# Patient Record
Sex: Male | Born: 1955 | Race: White | Hispanic: No | Marital: Married | Smoking: Never smoker
Health system: Southern US, Community
[De-identification: ages and names within clinical notes are randomized; demographics above are authoritative.]

## PROBLEM LIST (undated history)

## (undated) DIAGNOSIS — M1990 Unspecified osteoarthritis, unspecified site: Secondary | ICD-10-CM

## (undated) DIAGNOSIS — Z8489 Family history of other specified conditions: Secondary | ICD-10-CM

## (undated) DIAGNOSIS — Z8619 Personal history of other infectious and parasitic diseases: Secondary | ICD-10-CM

## (undated) DIAGNOSIS — K56609 Unspecified intestinal obstruction, unspecified as to partial versus complete obstruction: Secondary | ICD-10-CM

## (undated) DIAGNOSIS — Z86010 Personal history of colonic polyps: Secondary | ICD-10-CM

## (undated) DIAGNOSIS — N390 Urinary tract infection, site not specified: Secondary | ICD-10-CM

## (undated) DIAGNOSIS — R7989 Other specified abnormal findings of blood chemistry: Principal | ICD-10-CM

## (undated) DIAGNOSIS — I509 Heart failure, unspecified: Secondary | ICD-10-CM

## (undated) DIAGNOSIS — N400 Enlarged prostate without lower urinary tract symptoms: Secondary | ICD-10-CM

## (undated) DIAGNOSIS — E538 Deficiency of other specified B group vitamins: Secondary | ICD-10-CM

## (undated) DIAGNOSIS — M722 Plantar fascial fibromatosis: Secondary | ICD-10-CM

## (undated) HISTORY — DX: Other specified abnormal findings of blood chemistry: R79.89

## (undated) HISTORY — PX: APPENDECTOMY: SHX54

## (undated) HISTORY — PX: SMALL INTESTINE SURGERY: SHX150

## (undated) HISTORY — DX: Plantar fascial fibromatosis: M72.2

## (undated) HISTORY — PX: OTHER SURGICAL HISTORY: SHX169

## (undated) HISTORY — DX: Personal history of other infectious and parasitic diseases: Z86.19

## (undated) HISTORY — PX: CHOLECYSTECTOMY: SHX55

## (undated) HISTORY — DX: Personal history of colonic polyps: Z86.010

## (undated) HISTORY — DX: Heart failure, unspecified: I50.9

## (undated) HISTORY — DX: Unspecified intestinal obstruction, unspecified as to partial versus complete obstruction: K56.609

---

## 2005-03-16 ENCOUNTER — Emergency Department (HOSPITAL_COMMUNITY): Admission: EM | Admit: 2005-03-16 | Discharge: 2005-03-16 | Payer: Self-pay | Admitting: Emergency Medicine

## 2007-10-08 DIAGNOSIS — Z86010 Personal history of colonic polyps: Secondary | ICD-10-CM

## 2007-10-08 HISTORY — DX: Personal history of colonic polyps: Z86.010

## 2008-02-05 ENCOUNTER — Ambulatory Visit: Payer: Self-pay | Admitting: Internal Medicine

## 2008-02-19 ENCOUNTER — Encounter: Payer: Self-pay | Admitting: Internal Medicine

## 2008-02-19 ENCOUNTER — Ambulatory Visit: Payer: Self-pay | Admitting: Internal Medicine

## 2008-02-22 ENCOUNTER — Encounter: Payer: Self-pay | Admitting: Internal Medicine

## 2009-12-27 ENCOUNTER — Ambulatory Visit (HOSPITAL_COMMUNITY): Admission: RE | Admit: 2009-12-27 | Discharge: 2009-12-27 | Payer: Self-pay | Admitting: Orthopedic Surgery

## 2013-02-25 ENCOUNTER — Encounter: Payer: Self-pay | Admitting: Internal Medicine

## 2014-03-16 ENCOUNTER — Encounter: Payer: Self-pay | Admitting: Internal Medicine

## 2014-12-15 ENCOUNTER — Emergency Department (HOSPITAL_COMMUNITY)
Admission: EM | Admit: 2014-12-15 | Discharge: 2014-12-16 | Disposition: A | Discharge status: Home / Self Care | Payer: 59 | Attending: Emergency Medicine | Admitting: Emergency Medicine

## 2014-12-15 ENCOUNTER — Emergency Department (HOSPITAL_COMMUNITY): Payer: 59

## 2014-12-15 ENCOUNTER — Encounter (HOSPITAL_COMMUNITY): Payer: Self-pay

## 2014-12-15 DIAGNOSIS — Y998 Other external cause status: Secondary | ICD-10-CM | POA: Diagnosis not present

## 2014-12-15 DIAGNOSIS — W208XXA Other cause of strike by thrown, projected or falling object, initial encounter: Secondary | ICD-10-CM | POA: Diagnosis not present

## 2014-12-15 DIAGNOSIS — S92401A Displaced unspecified fracture of right great toe, initial encounter for closed fracture: Secondary | ICD-10-CM

## 2014-12-15 DIAGNOSIS — S90112A Contusion of left great toe without damage to nail, initial encounter: Secondary | ICD-10-CM | POA: Insufficient documentation

## 2014-12-15 DIAGNOSIS — S92422A Displaced fracture of distal phalanx of left great toe, initial encounter for closed fracture: Secondary | ICD-10-CM | POA: Diagnosis not present

## 2014-12-15 DIAGNOSIS — Z23 Encounter for immunization: Secondary | ICD-10-CM | POA: Diagnosis not present

## 2014-12-15 DIAGNOSIS — Y9289 Other specified places as the place of occurrence of the external cause: Secondary | ICD-10-CM | POA: Diagnosis not present

## 2014-12-15 DIAGNOSIS — S99922A Unspecified injury of left foot, initial encounter: Secondary | ICD-10-CM | POA: Diagnosis present

## 2014-12-15 DIAGNOSIS — Y9389 Activity, other specified: Secondary | ICD-10-CM | POA: Diagnosis not present

## 2014-12-15 DIAGNOSIS — S90212A Contusion of left great toe with damage to nail, initial encounter: Secondary | ICD-10-CM

## 2014-12-15 MED ORDER — HYDROCODONE-ACETAMINOPHEN 5-325 MG PO TABS
2.0000 | ORAL_TABLET | Freq: Once | ORAL | Status: AC
  Administered 2014-12-15: 2 via ORAL
  Filled 2014-12-15: qty 2

## 2014-12-15 NOTE — ED Notes (Signed)
PA at bedside.

## 2014-12-15 NOTE — ED Notes (Signed)
Pt reports dropping a nitrogen bottle (125 lbs) on left great toe about 6 am.  Swelling and discoloration to left great toe.  Sensation intact.

## 2014-12-15 NOTE — ED Provider Notes (Signed)
CSN: [id number]     Arrival date & time 12/15/14  2000 History  This chart was scribed for non-physician practitioner, Jinny Sanders, PA-C,  working with Tilden Fossa, MD by Freida Busman, ED Scribe. This patient was seen in room TR08C/TR08C and the patient's care was started at 8:33 PM.    Chief Complaint  Patient presents with  . Toe Injury    The history is provided by the patient. No language interpreter was used.     HPI Comments:  Bradley Dominguez is a 59 y.o. male who presents to the Emergency Department complaining of moderate constant left great toe s/p injury ~0600 this am. Pt states a nitrogen bottle ~100-125 pounds  fell on his toe. He states pain and bruising has progressively worsened since onset. No alleviating factors or associated symptoms noted. Patient denies numbness, weakness, loss of sensation or function.   History reviewed. No pertinent past medical history. Past Surgical History  Procedure Laterality Date  . Small intestine surgery    . Appendectomy    . Cholecystectomy     History reviewed. No pertinent family history. History  Substance Use Topics  . Smoking status: Never Smoker   . Smokeless tobacco: Not on file  . Alcohol Use: No    Review of Systems  Musculoskeletal: Positive for myalgias and arthralgias.  Skin: Positive for color change.      Allergies  Review of patient's allergies indicates not on file.  Home Medications   Prior to Admission medications   Medication Sig Start Date End Date Taking? Authorizing Provider  cephALEXin (KEFLEX) 500 MG capsule Take 1 capsule (500 mg total) by mouth 2 (two) times daily. 12/16/14   Ladona Mow, PA-C  HYDROcodone-acetaminophen (NORCO/VICODIN) 5-325 MG per tablet Take 1-2 tablets by mouth every 6 (six) hours as needed. 12/16/14   Ladona Mow, PA-C   BP 120/80 mmHg  Pulse 77  Temp(Src) 98.2 F (36.8 C) (Oral)  Resp 12  Ht  (1.854 m)  Wt 195 lb (88.451 kg)  BMI 25.73 kg/m2  SpO2 100% Physical  Exam  Constitutional: He is oriented to person, place, and time. He appears well-developed and well-nourished.  HENT:  Head: Normocephalic and atraumatic.  Cardiovascular: Normal rate.   Pulmonary/Chest: Effort normal.  Abdominal: He exhibits no distension.  Musculoskeletal:  Severe ecchymosis to left great toe, mostly at MTP joint and dorsal aspect of the toe with blood blister to dorsal aspect  Cap refill is less than 2 sec DP pulse 2+ Sensation intact distally   Neurological: He is alert and oriented to person, place, and time.  Skin: Skin is warm and dry.  Psychiatric: He has a normal mood and affect.  Nursing note and vitals reviewed.   ED Course  INCISION AND DRAINAGE Date/Time: 12/16/2014 3:06 AM Performed by: Ladona Mow Authorized by: Ladona Mow Consent: Verbal consent obtained. Consent given by: patient Time out: Immediately prior to procedure a "time out" was called to verify the correct patient, procedure, equipment, support staff and site/side marked as required. Type: subungual hematoma Body area: lower extremity Location details: left big toe Patient sedated: no Complexity: simple Drainage amount: scant Wound treatment: wound left open Patient tolerance: Patient tolerated the procedure well with no immediate complications Comments: Subungual hematoma nail trephination     DIAGNOSTIC STUDIES:  Oxygen Saturation is 100% on RA, normal by my interpretation.    COORDINATION OF CARE:  8:39 PM Discussed treatment plan with pt at bedside and pt agreed to plan.  Labs Review Labs Reviewed - No data to display  Imaging Review Dg Toe Great Left  12/15/2014   CLINICAL DATA:  A bottle fell on the patient's first toe at 6 a.m. this morning. Pain and bruising.  EXAM: LEFT GREAT TOE  COMPARISON:  None.  FINDINGS: Comminuted distracted fractures of the mid and distal aspect of the distal phalanx of the left first toe. Fractures do not appear to extend to the joint  surface. Associated soft tissue swelling.  IMPRESSION: Acute posttraumatic crush fractures of the distal phalanx of the left first toe.   Electronically Signed   By: Burman NievesWilliam  Stevens M.D.   On: 12/15/2014 21:48     EKG Interpretation None      MDM   Final diagnoses:  Fracture of great toe, right, closed, initial encounter  Subungual hematoma of great toe of left foot, initial encounter    Patient here with toe injury after dropping a metal cylinder with compressed gas on it this morning. Radiographs with impression of an acute posttraumatic crush fractures of the distal phalanx of the left first toe. Associated fracture blister noted on exam. Patient neurovascularly intact. I consulted with Dr. Devonne DoughtyNoris regarding this patient, Dr. Devonne DoughtyNoris recommends to test it made any obvious subungual hematoma, up-to-date tetanus, give patient does of Ancef in the ER, sent home with by mouth Keflex, dress injury with bulky nonadhesive dressing and placed patient in postop shoe, to be nonweightbearing on forefoot. Nail trephinated as noted in procedure note. Patient offered Ancef, however declined it with patient stating he does not want it due to the amount of time he has been here, tetanus updated, followed recommendations by Dr. Devonne DoughtyNoris, and discharged home to follow-up with Dr. Roderic OvensNorth as an outpatient early next week. Discussed return precautions with patient, and patient verbalizes understanding and agreement of this plan. I encouraged patient to call or return to the ER should he have any questions or concerns.  I personally performed the services described in this documentation, which was scribed in my presence. The recorded information has been reviewed and is accurate.  BP 120/80 mmHg  Pulse 77  Temp(Src) 98.2 F (36.8 C) (Oral)  Resp 12  Ht 6\' 1"  (1.854 m)  Wt 195 lb (88.451 kg)  BMI 25.73 kg/m2  SpO2 100%  Signed,  Ladona MowJoe Rhealynn Myhre, PA-C 2:45 AM  Patient seen and discussed with Dr. Tilden FossaElizabeth Rees,  M.D.   Ladona MowJoe Selisa Tensley, PA-C 12/16/14 0245  Ladona MowJoe Lizet Kelso, PA-C 12/16/14 [phone]  Tilden FossaElizabeth Rees, MD 12/20/14 [phone]

## 2014-12-15 NOTE — ED Notes (Signed)
Pt returned to room from radiology

## 2014-12-15 NOTE — ED Notes (Signed)
Pt states he dropped a nitrogen bottle on his left great toe this am, states he is able to walk. Toe swollen and purple bruising present.

## 2014-12-15 NOTE — ED Notes (Signed)
Patient transported to X-ray 

## 2014-12-16 MED ORDER — STERILE WATER FOR INJECTION IJ SOLN: 2.5000 mL | Freq: Once | INTRAMUSCULAR | Status: DC

## 2014-12-16 MED ORDER — CEPHALEXIN 500 MG PO CAPS: 500.0000 mg | ORAL_CAPSULE | Freq: Two times a day (BID) | ORAL | Status: DC

## 2014-12-16 MED ORDER — CEFAZOLIN SODIUM 1 G IJ SOLR
500.0000 mg | Freq: Once | INTRAMUSCULAR | Status: DC
  Filled 2014-12-16: qty 10

## 2014-12-16 MED ORDER — HYDROCODONE-ACETAMINOPHEN 5-325 MG PO TABS: 1.0000 | ORAL_TABLET | Freq: Four times a day (QID) | ORAL | Status: DC | PRN

## 2014-12-16 MED ORDER — TETANUS-DIPHTH-ACELL PERTUSSIS 5-2.5-18.5 LF-MCG/0.5 IM SUSP
0.5000 mL | Freq: Once | INTRAMUSCULAR | Status: AC
  Administered 2014-12-16: 0.5 mL via INTRAMUSCULAR
  Filled 2014-12-16: qty 0.5

## 2014-12-16 MED ORDER — SODIUM CHLORIDE 0.9 % IV SOLN
500.0000 mg | Freq: Once | INTRAVENOUS | Status: DC
  Filled 2014-12-16: qty 4.95

## 2014-12-16 NOTE — Discharge Instructions (Signed)
Do not walk on the front of your toe and foot. Walk on the heel of your foot. Follow-up with orthopedics. Use medications as prescribed. Return to the ER with any numbness, weakness, tingling, loss of sensation or function, severe swelling, high fever greater than 100.17F.  Toe Fracture Your caregiver has diagnosed you as having a fractured toe. A toe fracture is a break in the bone of a toe. "Buddy taping" is a way of splinting your broken toe, by taping the broken toe to the toe next to it. This "buddy taping" will keep the injured toe from moving beyond normal range of motion. Buddy taping also helps the toe heal in a more normal alignment. It may take 6 to 8 weeks for the toe injury to heal. HOME CARE INSTRUCTIONS   Leave your toes taped together for as long as directed by your caregiver or until you see a doctor for a follow-up examination. You can change the tape after bathing. Always use a small piece of gauze or cotton between the toes when taping them together. This will help the skin stay dry and prevent infection.  Apply ice to the injury for 15-20 minutes each hour while awake for the first 2 days. Put the ice in a plastic bag and place a towel between the bag of ice and your skin.  After the first 2 days, apply heat to the injured area. Use heat for the next 2 to 3 days. Place a heating pad on the foot or soak the foot in warm water as directed by your caregiver.  Keep your foot elevated as much as possible to lessen swelling.  Wear sturdy, supportive shoes. The shoes should not pinch the toes or fit tightly against the toes.  Your caregiver may prescribe a rigid shoe if your foot is very swollen.  Your may be given crutches if the pain is too great and it hurts too much to walk.  Only take over-the-counter or prescription medicines for pain, discomfort, or fever as directed by your caregiver.  If your caregiver has given you a follow-up appointment, it is very important to keep  that appointment. Not keeping the appointment could result in a chronic or permanent injury, pain, and disability. If there is any problem keeping the appointment, you must call back to this facility for assistance. SEEK MEDICAL CARE IF:   You have increased pain or swelling, not relieved with medications.  The pain does not get better after 1 week.  Your injured toe is cold when the others are warm. SEEK IMMEDIATE MEDICAL CARE IF:   The toe becomes cold, numb, or white.  The toe becomes hot (inflamed) and red. Document Released: 09/20/2000 Document Revised: 12/16/2011 Document Reviewed: 05/09/2008 [facility] Patient Information 2015 [employer] [place] [place]. This information is not intended to replace advice given to you by your health care provider. Make sure you discuss any questions you have with your health care provider.

## 2015-01-02 ENCOUNTER — Encounter: Payer: Self-pay | Admitting: Nurse Practitioner

## 2015-01-17 ENCOUNTER — Ambulatory Visit (INDEPENDENT_AMBULATORY_CARE_PROVIDER_SITE_OTHER): Payer: 59 | Admitting: Nurse Practitioner

## 2015-01-17 ENCOUNTER — Encounter: Payer: Self-pay | Admitting: Nurse Practitioner

## 2015-01-17 VITALS — BP 106/86 | HR 78 | Ht 73.0 in | Wt 193.0 lb

## 2015-01-17 DIAGNOSIS — Z86010 Personal history of colonic polyps: Secondary | ICD-10-CM | POA: Diagnosis not present

## 2015-01-17 DIAGNOSIS — K625 Hemorrhage of anus and rectum: Secondary | ICD-10-CM | POA: Diagnosis not present

## 2015-01-17 MED ORDER — PEG-KCL-NACL-NASULF-NA ASC-C 100 G PO SOLR: 1.0000 | Freq: Once | ORAL | Status: DC

## 2015-01-17 NOTE — Patient Instructions (Signed)

## 2015-01-18 ENCOUNTER — Encounter: Payer: Self-pay | Admitting: Internal Medicine

## 2015-01-18 ENCOUNTER — Encounter: Payer: Self-pay | Admitting: Nurse Practitioner

## 2015-01-18 DIAGNOSIS — Z860100 Personal history of colon polyps, unspecified: Secondary | ICD-10-CM | POA: Insufficient documentation

## 2015-01-18 DIAGNOSIS — K625 Hemorrhage of anus and rectum: Secondary | ICD-10-CM | POA: Insufficient documentation

## 2015-01-18 NOTE — Progress Notes (Signed)
    HPI :  Patient is a 59 year old male known remotely to Dr. Henrene Pastor. He has a history of adenomatous colon polyps in 2009. Patient is referred by PCP for evaluation of rectal bleeding. Approximately 3 weeks ago patient broke his toe, sounds like he developed a blister or abscess which was drained and treated with antibiotics. While on antibiotics patient began having rectal bleeding. He had no constipation or diarrhea. Patient stopped the antibiotics (Keflex)) and bleeding stopped. He had no associated rectal pain or abdominal pain. Patient was evaluated by his PCP, labs on the revealed hemoglobin of 12.9, MCV 97. White count normal. LFTs, renal function normal.   Past Medical History  Diagnosis Date  . Plantar fasciitis   . History of shingles   . History of colon polyps 2009    Past Surgical History  Procedure Laterality Date  . Small intestine surgery      1979  . Appendectomy    . Cholecystectomy       Family History  Problem Relation Age of Onset  . Diabetes     History  Substance Use Topics  . Smoking status: Never Smoker   . Smokeless tobacco: Never Used  . Alcohol Use: No   Current Outpatient Prescriptions  Medication Sig Dispense Refill  . peg 3350 powder (MOVIPREP) 100 G SOLR Take 1 kit (200 g total) by mouth once. 1 kit 0   No current facility-administered medications for this visit.   No Known Allergies   Review of Systems: All systems reviewed and negative except where noted in HPI.   Physical Exam: BP 106/86 mmHg  Pulse 78  Ht $R'6\' 1"'qv$  (1.854 m)  Wt 193 lb (87.544 kg)  BMI 25.47 kg/m2 Constitutional: Pleasant,well-developed, white male in no acute distress. HEENT: Normocephalic and atraumatic. Conjunctivae are normal. No scleral icterus. Neck supple.  Cardiovascular: Normal rate, regular rhythm.  Pulmonary/chest: Effort normal and breath sounds normal. No wheezing, rales or rhonchi. Abdominal: Soft, nondistended, nontender. Bowel sounds active  throughout. There are no masses palpable. No hepatomegaly. Rectal: prefers to wait until colonoscopy Extremities: no edema Lymphadenopathy: No cervical adenopathy noted. Neurological: Alert and oriented to person place and time. Skin: Skin is warm and dry. No rashes noted. Psychiatric: Normal mood and affect. Behavior is normal.   ASSESSMENT AND PLAN:  53. 59 year old male referred for painless rectal bleeding. Bleeding occurred while taking antibiotics Fahrenheit don't think there is any relationship. He had no constipation or diarrhea associated with the bleeding. Hemoglobin okay at 12.9. For further evaluation of bleeding patient will be scheduled for colonoscopy. The risks, benefits, and alternatives to colonoscopy with possible biopsy and possible polypectomy were discussed with the patient and he consents to proceed.   2. History of adenomatous colon polyps in 2009. He is to for surveillance colonoscopy   3. History of intestinal surgery as teenager. 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 Following that patient had small bowel obstructions requiring additional resection. No problems and the last 30 years or more.  He does have a tortuous and fixed colon based on last colonoscopy, this may be from adhesions.   CC: Domenick Gong, MD

## 2015-01-24 NOTE — Progress Notes (Signed)
Agree with initial assessment and plans as outlined 

## 2015-01-27 ENCOUNTER — Encounter: Payer: Self-pay | Admitting: Internal Medicine

## 2015-01-27 ENCOUNTER — Ambulatory Visit (AMBULATORY_SURGERY_CENTER): Payer: 59 | Admitting: Internal Medicine

## 2015-01-27 VITALS — BP 112/66 | HR 58 | Temp 96.9°F | Resp 28 | Ht 73.0 in | Wt 193.0 lb

## 2015-01-27 DIAGNOSIS — Z86010 Personal history of colonic polyps: Secondary | ICD-10-CM | POA: Diagnosis present

## 2015-01-27 DIAGNOSIS — K625 Hemorrhage of anus and rectum: Secondary | ICD-10-CM

## 2015-01-27 MED ORDER — SODIUM CHLORIDE 0.9 % IV SOLN: 500.0000 mL | INTRAVENOUS | Status: DC

## 2015-01-27 NOTE — Op Note (Signed)
[facility] [address] [place], [id number]   COLONOSCOPY PROCEDURE REPORT  PATIENT: Bradley BrunsFields, [name]  MR#: [id number] BIRTHDATE: 09-15-1956 , 58  yrs. old GENDER: male ENDOSCOPIST: Roxy CedarJohn N Perry Jr, MD REFERRED BM:[id number]:Richard Tisovec, M.D. PROCEDURE DATE:  01/27/2015 PROCEDURE:   Colonoscopy, surveillance First Screening Colonoscopy - Avg.  risk and is 50 yrs.  old or older - No.  Prior Negative Screening - Now for repeat screening. N/A  History of Adenoma - Now for follow-up colonoscopy & has been > or = to 3 yrs.  Yes hx of adenoma.  Has been 3 or more years since last colonoscopy. ASA CLASS:   Class II INDICATIONS:Surveillance due to prior colonic neoplasia and PH Colon Adenoma.   Index exam 02-2008 w/ small TA (DIFFICULT EXAM) MEDICATIONS: Monitored anesthesia care and Propofol 350 mg IV  DESCRIPTION OF PROCEDURE:   After the risks benefits and alternatives of the procedure were thoroughly explained, informed consent was obtained.  The digital rectal exam revealed no abnormalities of the rectum.   The LB [id number]  endoscope was introduced through the anus and advanced to the cecum, which was identified by both the appendix and ileocecal valve. No adverse events experienced.   The quality of the prep was excellent. (MoviPrep was used)  The instrument was then slowly withdrawn as the colon was fully examined.     COLON FINDINGS: VERY DIFFICULT EXAM DUE TO PRIOR EXTENSIVE ABDOMINAL SURGERY.A normal appearing cecum, ileocecal valve, and appendiceal orifice were identified.  The ascending, transverse, descending, sigmoid colon, and rectum appeared unremarkable.  Retroflexed views revealed internal hemorrhoids and prominent rectal veins. The time to cecum = 16.8 Withdrawal time = 9.8   The scope was withdrawn and the procedure completed. COMPLICATIONS: There were no immediate complications.  ENDOSCOPIC IMPRESSION: 1. Normal colonoscopy 2. Difficult  exam due to altered anatomy  RECOMMENDATIONS: 1. Continue current colorectal screening recommendations with a repeat examination in 10 years. Recommend VIRTUAL COLONOSCOPY GIVEN ANATOMY 2. Previous bleeding due to hemorrhoids  eSigned:  Roxy CedarJohn N Perry Jr, MD 01/27/2015 3:58 PM   cc: Guerry Bruinichard Tisovec, MD and The Patient

## 2015-01-27 NOTE — Progress Notes (Signed)
A/ox3 pleased with MAC, report to Penny RN 

## 2015-01-27 NOTE — Patient Instructions (Addendum)
YOU HAD AN ENDOSCOPIC PROCEDURE TODAY AT THE [facility]:   Refer to the procedure report that was given to you for any specific questions about what was found during the examination.  If the procedure report does not answer your questions, please call your gastroenterologist to clarify.  If you requested that your care partner not be given the details of your procedure findings, then the procedure report has been included in a sealed envelope for you to review at your convenience later.  YOU SHOULD EXPECT: Some feelings of bloating in the abdomen. Passage of more gas than usual.  Walking can help get rid of the air that was put into your GI tract during the procedure and reduce the bloating. If you had a lower endoscopy (such as a colonoscopy or flexible sigmoidoscopy) you may notice spotting of blood in your stool or on the toilet paper. If you underwent a bowel prep for your procedure, you may not have a normal bowel movement for a few days.  Please Note:  You might notice some irritation and congestion in your nose or some drainage.  This is from the oxygen used during your procedure.  There is no need for concern and it should clear up in a day or so.  SYMPTOMS TO REPORT IMMEDIATELY:   Following lower endoscopy (colonoscopy or flexible sigmoidoscopy):  Excessive amounts of blood in the stool  Significant tenderness or worsening of abdominal pains  Swelling of the abdomen that is new, acute  Fever of 100F or higher    For urgent or emergent issues, a gastroenterologist can be reached at any hour by calling [phone].   DIET: Your first meal following the procedure should be a small meal and then it is ok to progress to your normal diet. Heavy or fried foods are harder to digest and may make you feel nauseous or bloated.  Likewise, meals heavy in dairy and vegetables can increase bloating.  Drink plenty of fluids but you should avoid alcoholic beverages for 24  hours.  ACTIVITY:  You should plan to take it easy for the rest of today and you should NOT DRIVE or use heavy machinery until tomorrow (because of the sedation medicines used during the test).    FOLLOW UP: Our staff will call the number listed on your records the next business day following your procedure to check on you and address any questions or concerns that you may have regarding the information given to you following your procedure. If we do not reach you, we will leave a message.  However, if you are feeling well and you are not experiencing any problems, there is no need to return our call.  We will assume that you have returned to your regular daily activities without incident.  If any biopsies were taken you will be contacted by phone or by letter within the next 1-3 weeks.  Please call us at [phone] if you have not heard about the biopsies in 3 weeks.    SIGNATURES/CONFIDENTIALITY: You and/or your care partner have signed paperwork which will be entered into your electronic medical record.  These signatures attest to the fact that that the information above on your After Visit Summary has been reviewed and is understood.  Full responsibility of the confidentiality of this discharge information lies with you and/or your care-partner.   Information on hemorrhoids given to you today 

## 2015-01-30 ENCOUNTER — Telehealth: Payer: Self-pay

## 2015-01-30 NOTE — Telephone Encounter (Signed)
  Follow up Call-  Call back number 01/27/2015  Post procedure Call Back phone  # [phone]  Permission to leave phone message No  comments no answering machine     Patient questions:  Do you have a fever, pain , or abdominal swelling? No. Pain Score  0 *  Have you tolerated food without any problems? Yes.    Have you been able to return to your normal activities? Yes.    Do you have any questions about your discharge instructions: Diet   No. Medications  No. Follow up visit  No.  Do you have questions or concerns about your Care? No.  Actions: * If pain score is 4 or above: No action needed, pain <4.

## 2015-04-17 ENCOUNTER — Encounter (HOSPITAL_BASED_OUTPATIENT_CLINIC_OR_DEPARTMENT_OTHER): Payer: Self-pay | Admitting: *Deleted

## 2015-04-17 ENCOUNTER — Emergency Department (HOSPITAL_BASED_OUTPATIENT_CLINIC_OR_DEPARTMENT_OTHER)
Admission: EM | Admit: 2015-04-17 | Discharge: 2015-04-17 | Disposition: A | Discharge status: Home / Self Care | Payer: 59 | Attending: Emergency Medicine | Admitting: Emergency Medicine

## 2015-04-17 DIAGNOSIS — R51 Headache: Secondary | ICD-10-CM | POA: Insufficient documentation

## 2015-04-17 DIAGNOSIS — Z8619 Personal history of other infectious and parasitic diseases: Secondary | ICD-10-CM | POA: Diagnosis not present

## 2015-04-17 DIAGNOSIS — Z8739 Personal history of other diseases of the musculoskeletal system and connective tissue: Secondary | ICD-10-CM | POA: Insufficient documentation

## 2015-04-17 DIAGNOSIS — R509 Fever, unspecified: Secondary | ICD-10-CM | POA: Diagnosis present

## 2015-04-17 DIAGNOSIS — N390 Urinary tract infection, site not specified: Secondary | ICD-10-CM | POA: Insufficient documentation

## 2015-04-17 DIAGNOSIS — Z86010 Personal history of colonic polyps: Secondary | ICD-10-CM | POA: Diagnosis not present

## 2015-04-17 LAB — URINE MICROSCOPIC-ADD ON

## 2015-04-17 LAB — URINALYSIS, ROUTINE W REFLEX MICROSCOPIC
Bilirubin Urine: NEGATIVE
Glucose, UA: NEGATIVE mg/dL
KETONES UR: NEGATIVE mg/dL
NITRITE: NEGATIVE
PH: 6.5 (ref 5.0–8.0)
PROTEIN: 30 mg/dL — AB
Specific Gravity, Urine: 1.016 (ref 1.005–1.030)
UROBILINOGEN UA: 0.2 mg/dL (ref 0.0–1.0)

## 2015-04-17 MED ORDER — CEPHALEXIN 500 MG PO CAPS: 500.0000 mg | ORAL_CAPSULE | Freq: Four times a day (QID) | ORAL | Status: DC

## 2015-04-17 MED ORDER — CEFTRIAXONE SODIUM 1 G IJ SOLR
1.0000 g | Freq: Once | INTRAMUSCULAR | Status: AC
  Administered 2015-04-17: 1 g via INTRAMUSCULAR
  Filled 2015-04-17: qty 10

## 2015-04-17 MED ORDER — LIDOCAINE HCL (PF) 1 % IJ SOLN
INTRAMUSCULAR | Status: AC
  Administered 2015-04-17: 2.1 mL
  Filled 2015-04-17: qty 5

## 2015-04-17 NOTE — ED Provider Notes (Signed)
CSN: [id number]     Arrival date & time 04/17/15  1845 History  This chart was scribed for Bradley Lyons, MD by Budd Palmer, ED Scribe. This patient was seen in room MH05/MH05 and the patient's care was started at 8:02 PM.    Chief Complaint  Patient presents with  . Fever  Patient is a 59 y.o. male presenting with fever. The history is provided by the patient. No language interpreter was used.  Fever Max temp prior to arrival:  102 Onset quality:  Sudden Duration:  2 days Timing:  Constant Progression:  Unchanged Chronicity:  New Associated symptoms: chills and headaches   Associated symptoms: no cough, no diarrhea, no ear pain, no nausea, no rash, no sore throat and no vomiting   Risk factors: no sick contacts    HPI Comments: Bradley Dominguez is a 59 y.o. male who presents to the Emergency Department complaining of a fever Tmax 102, onset 2 days ago. He reports associated chills, urgency without being able to urinate, and HA. He has been taking Tylenol with effective relief of fever. He has taken Excedrin which has relieved the headache. He is not on any other medications. He is otherwise healthy. He denies n/v/d, earache, sore throat, cough, rash. He denies sick contacts and PMHx of UTI.  Past Medical History  Diagnosis Date  . Plantar fasciitis   . History of shingles   . History of colon polyps 2009   Past Surgical History  Procedure Laterality Date  . Small intestine surgery      1979  . Appendectomy    . Cholecystectomy     Family History  Problem Relation Age of Onset  . Diabetes     History  Substance Use Topics  . Smoking status: Never Smoker   . Smokeless tobacco: Never Used  . Alcohol Use: No    Review of Systems  Constitutional: Positive for fever and chills.  HENT: Negative for ear pain and sore throat.   Respiratory: Negative for cough.   Gastrointestinal: Negative for nausea, vomiting and diarrhea.  Genitourinary: Positive for urgency.  Skin:  Negative for rash.  Neurological: Positive for headaches.  All other systems reviewed and are negative.     Allergies  Review of patient's allergies indicates no known allergies.  Home Medications   Prior to Admission medications   Medication Sig Start Date End Date Taking? Authorizing Provider  acetaminophen (TYLENOL) 500 MG tablet Take 1,000 mg by mouth every 6 (six) hours as needed.   Yes Historical Provider, MD   BP 123/66 mmHg  Pulse 85  Temp(Src) 99.1 F (37.3 C)  Resp 18  Wt 193 lb (87.544 kg)  SpO2 98% Physical Exam  Constitutional: He is oriented to person, place, and time. He appears well-developed and well-nourished. No distress.  HENT:  Head: Normocephalic and atraumatic.  Right Ear: External ear normal.  Left Ear: External ear normal.  Mouth/Throat: Oropharynx is clear and moist.  Ears and throat normal  Eyes: Conjunctivae and EOM are normal. Pupils are equal, round, and reactive to light.  Neck: Normal range of motion. Neck supple. No tracheal deviation present.  Cardiovascular: Normal rate.   Pulmonary/Chest: Breath sounds normal. No respiratory distress.  Abdominal: Soft.  Musculoskeletal: Normal range of motion.  Neurological: He is alert and oriented to person, place, and time.  Skin: Skin is warm and dry.  Psychiatric: He has a normal mood and affect. His behavior is normal.  Nursing note and vitals reviewed.  ED Course  Procedures  DIAGNOSTIC STUDIES: Oxygen Saturation is 98% on RA, normal by my interpretation.    COORDINATION OF CARE: 8:09 PM - Discussed urinalysis. Discussed plans to order antibiotics. Advised to continue taking tylenol/motrin and return to ED if symptoms worse. Pt advised of plan for treatment and pt agrees.  Labs Review Labs Reviewed  URINALYSIS, ROUTINE W REFLEX MICROSCOPIC (NOT [facility]) - Abnormal; Notable for the following:    APPearance CLOUDY (*)    Hgb urine dipstick LARGE (*)    Protein, ur 30 (*)    Leukocytes,  UA LARGE (*)    All other components within normal limits  URINE MICROSCOPIC-ADD ON - Abnormal; Notable for the following:    Squamous Epithelial / LPF FEW (*)    Bacteria, UA MANY (*)    All other components within normal limits    Imaging Review No results found.   EKG Interpretation None      MDM   Final diagnoses:  None    Patient presents with complaints of fever and urinary frequency. His urine analysis reveals a UTI. He appears nontoxic, is afebrile in the ER. There is no tachycardia or hypotension or suggestions of a septic condition. He will be treated with antibiotics and when necessary return.  I personally performed the services described in this documentation, which was scribed in my presence. The recorded information has been reviewed and is accurate.      Bradley Lyonsouglas Magaline Steinberg, MD 04/18/15 [phone]

## 2015-04-17 NOTE — ED Notes (Signed)
Pt c/o fever and body aches x 2 days, also c/o freq urination

## 2015-04-17 NOTE — Discharge Instructions (Signed)
Keflex as prescribed.  Tylenol 1000 mg rotated with ibuprofen 600 mg every 4 hours as needed for pain or fever.  Return to the emergency department if symptoms significantly worsen or change.   Urinary Tract Infection Urinary tract infections (UTIs) can develop anywhere along your urinary tract. Your urinary tract is your body's drainage system for removing wastes and extra water. Your urinary tract includes two kidneys, two ureters, a bladder, and a urethra. Your kidneys are a pair of bean-shaped organs. Each kidney is about the size of your fist. They are located below your ribs, one on each side of your spine. CAUSES Infections are caused by microbes, which are microscopic organisms, including fungi, viruses, and bacteria. These organisms are so small that they can only be seen through a microscope. Bacteria are the microbes that most commonly cause UTIs. SYMPTOMS  Symptoms of UTIs may vary by age and gender of the patient and by the location of the infection. Symptoms in young women typically include a frequent and intense urge to urinate and a painful, burning feeling in the bladder or urethra during urination. Older women and men are more likely to be tired, shaky, and weak and have muscle aches and abdominal pain. A fever may mean the infection is in your kidneys. Other symptoms of a kidney infection include pain in your back or sides below the ribs, nausea, and vomiting. DIAGNOSIS To diagnose a UTI, your caregiver will ask you about your symptoms. Your caregiver also will ask to provide a urine sample. The urine sample will be tested for bacteria and white blood cells. White blood cells are made by your body to help fight infection. TREATMENT  Typically, UTIs can be treated with medication. Because most UTIs are caused by a bacterial infection, they usually can be treated with the use of antibiotics. The choice of antibiotic and length of treatment depend on your symptoms and the type of  bacteria causing your infection. HOME CARE INSTRUCTIONS  If you were prescribed antibiotics, take them exactly as your caregiver instructs you. Finish the medication even if you feel better after you have only taken some of the medication.  Drink enough water and fluids to keep your urine clear or pale yellow.  Avoid caffeine, tea, and carbonated beverages. They tend to irritate your bladder.  Empty your bladder often. Avoid holding urine for long periods of time.  Empty your bladder before and after sexual intercourse.  After a bowel movement, women should cleanse from front to back. Use each tissue only once. SEEK MEDICAL CARE IF:   You have back pain.  You develop a fever.  Your symptoms do not begin to resolve within 3 days. SEEK IMMEDIATE MEDICAL CARE IF:   You have severe back pain or lower abdominal pain.  You develop chills.  You have nausea or vomiting.  You have continued burning or discomfort with urination. MAKE SURE YOU:   Understand these instructions.  Will watch your condition.  Will get help right away if you are not doing well or get worse. Document Released: 07/03/2005 Document Revised: 03/24/2012 Document Reviewed: 11/01/2011 [facility] Patient Information 2015 [employer] [place]. This information is not intended to replace advice given to you by your health care provider. Make sure you discuss any questions you have with your health care provider.

## 2015-04-17 NOTE — ED Notes (Signed)
Pt moved to discharge waiting room pending DC.

## 2015-11-30 ENCOUNTER — Encounter: Payer: Self-pay | Admitting: Internal Medicine

## 2016-10-15 DIAGNOSIS — H52203 Unspecified astigmatism, bilateral: Secondary | ICD-10-CM | POA: Diagnosis not present

## 2016-10-15 DIAGNOSIS — H524 Presbyopia: Secondary | ICD-10-CM | POA: Diagnosis not present

## 2016-10-15 DIAGNOSIS — H5203 Hypermetropia, bilateral: Secondary | ICD-10-CM | POA: Diagnosis not present

## 2016-10-25 DIAGNOSIS — N390 Urinary tract infection, site not specified: Secondary | ICD-10-CM | POA: Diagnosis not present

## 2016-10-25 DIAGNOSIS — B961 Klebsiella pneumoniae [K. pneumoniae] as the cause of diseases classified elsewhere: Secondary | ICD-10-CM | POA: Diagnosis not present

## 2016-10-25 DIAGNOSIS — R310 Gross hematuria: Secondary | ICD-10-CM | POA: Diagnosis not present

## 2016-10-30 DIAGNOSIS — R310 Gross hematuria: Secondary | ICD-10-CM | POA: Diagnosis not present

## 2016-11-07 DIAGNOSIS — R310 Gross hematuria: Secondary | ICD-10-CM | POA: Diagnosis not present

## 2017-01-22 DIAGNOSIS — N390 Urinary tract infection, site not specified: Secondary | ICD-10-CM | POA: Diagnosis not present

## 2017-01-22 DIAGNOSIS — Z0000 Encounter for general adult medical examination without abnormal findings: Secondary | ICD-10-CM | POA: Diagnosis not present

## 2017-01-29 DIAGNOSIS — Z1389 Encounter for screening for other disorder: Secondary | ICD-10-CM | POA: Diagnosis not present

## 2017-01-29 DIAGNOSIS — E538 Deficiency of other specified B group vitamins: Secondary | ICD-10-CM | POA: Diagnosis not present

## 2017-01-29 DIAGNOSIS — N390 Urinary tract infection, site not specified: Secondary | ICD-10-CM | POA: Diagnosis not present

## 2017-01-29 DIAGNOSIS — R339 Retention of urine, unspecified: Secondary | ICD-10-CM | POA: Diagnosis not present

## 2017-01-29 DIAGNOSIS — Z0000 Encounter for general adult medical examination without abnormal findings: Secondary | ICD-10-CM | POA: Diagnosis not present

## 2017-02-15 ENCOUNTER — Emergency Department (HOSPITAL_BASED_OUTPATIENT_CLINIC_OR_DEPARTMENT_OTHER): Payer: 59

## 2017-02-15 ENCOUNTER — Encounter (HOSPITAL_BASED_OUTPATIENT_CLINIC_OR_DEPARTMENT_OTHER): Payer: Self-pay | Admitting: Emergency Medicine

## 2017-02-15 ENCOUNTER — Emergency Department (HOSPITAL_BASED_OUTPATIENT_CLINIC_OR_DEPARTMENT_OTHER)
Admission: EM | Admit: 2017-02-15 | Discharge: 2017-02-15 | Disposition: A | Discharge status: Home / Self Care | Payer: 59 | Attending: Emergency Medicine | Admitting: Emergency Medicine

## 2017-02-15 DIAGNOSIS — Y939 Activity, unspecified: Secondary | ICD-10-CM | POA: Insufficient documentation

## 2017-02-15 DIAGNOSIS — Y999 Unspecified external cause status: Secondary | ICD-10-CM | POA: Diagnosis not present

## 2017-02-15 DIAGNOSIS — W19XXXA Unspecified fall, initial encounter: Secondary | ICD-10-CM | POA: Insufficient documentation

## 2017-02-15 DIAGNOSIS — S6991XA Unspecified injury of right wrist, hand and finger(s), initial encounter: Secondary | ICD-10-CM | POA: Diagnosis not present

## 2017-02-15 DIAGNOSIS — Y929 Unspecified place or not applicable: Secondary | ICD-10-CM | POA: Insufficient documentation

## 2017-02-15 DIAGNOSIS — Z79899 Other long term (current) drug therapy: Secondary | ICD-10-CM | POA: Insufficient documentation

## 2017-02-15 DIAGNOSIS — M25441 Effusion, right hand: Secondary | ICD-10-CM

## 2017-02-15 DIAGNOSIS — M7989 Other specified soft tissue disorders: Secondary | ICD-10-CM | POA: Insufficient documentation

## 2017-02-15 HISTORY — DX: Benign prostatic hyperplasia without lower urinary tract symptoms: N40.0

## 2017-02-15 MED ORDER — NAPROXEN 500 MG PO TABS: 500.0000 mg | ORAL_TABLET | Freq: Two times a day (BID) | ORAL | 0 refills | Status: DC

## 2017-02-15 NOTE — ED Provider Notes (Signed)
Pt seen and evaluated.  Discussed with PA. Felicita GageJ. Geiple. STS without errythema or cellulitis to RUE MCP. Arthritic on xray. Likely arthritis exacerbated by recent fall. Has tuft fx to rue 5th P3. Again, Likely from fall. Doubt that 3rd digit infectious/septic arthritis.   Plan NSAID and PCP f/u.  Return to ER with fever, red streaks, etc.   Rolland PorterJames, Aliviyah Malanga, MD 02/15/17 2213

## 2017-02-15 NOTE — ED Triage Notes (Signed)
Was strung by a wasp on wed and now has ? cellulitis to right ant hand/knuckle area with swelling and redness

## 2017-02-15 NOTE — ED Provider Notes (Signed)
[facility] Provider Note   CSN: [id number] Arrival date & time: 02/15/17  1853  By signing my name below, I, Lyndon CodeMaurice Copeland Jr., attest that this documentation has been prepared under the direction and in the presence of Renne CriglerJoshua Charlette Hennings, [place]-C. Electronically Signed: Orpah CobbMaurice Copeland , ED Scribe. 02/15/17. 8:27 PM.   History   Chief Complaint Chief Complaint  Patient presents with  . Hand Problem    HPI Bradley Dominguez is a 61 y.o. male with no significant medical hx who presents to the Emergency Department complaining of R hand problem with onset x3 days. Pt states that he was stung by a bee x3 days ago but when he woke up today he could not move the R hand or make a fist. Per wife, pt had a fall x2 weeks ago and she is unsure if the fall is related. Pt reports R hand pain/swelling. He denies any modifying factors. Pt denies numbness, tingling, fever. Of note, pt recently finished a course of antibiotics for a bladder infection.   The history is provided by the patient and the spouse. No language interpreter was used.    Past Medical History:  Diagnosis Date  . History of colon polyps 2009  . History of shingles   . Plantar fasciitis   . Prostate hypertrophy     Patient Active Problem List   Diagnosis Date Noted  . Hx of colonic polyps 01/18/2015  . Rectal bleeding 01/18/2015    Past Surgical History:  Procedure Laterality Date  . APPENDECTOMY    . CHOLECYSTECTOMY    . SMALL INTESTINE SURGERY     1979       Home Medications    Prior to Admission medications   Medication Sig Start Date End Date Taking? Authorizing Provider  finasteride (PROSCAR) 5 MG tablet Take 5 mg by mouth daily.   Yes [provider]  tamsulosin (FLOMAX) 0.4 MG CAPS capsule Take 0.4 mg by mouth.   Yes [provider]  acetaminophen (TYLENOL) 500 MG tablet Take 1,000 mg by mouth every 6 (six) hours as needed.    [provider]  cephALEXin (KEFLEX) 500 MG  capsule Take 1 capsule (500 mg total) by mouth 4 (four) times daily. 04/17/15   Geoffery Lyonselo, Douglas, MD    Family History Family History  Problem Relation Age of Onset  . Diabetes Unknown     Social History Social History  Substance Use Topics  . Smoking status: Never Smoker  . Smokeless tobacco: Never Used  . Alcohol use No     Allergies   Patient has no known allergies.   Review of Systems Review of Systems  Constitutional: Negative for activity change and fever.  Gastrointestinal: Constipation: R hand.  Musculoskeletal: Positive for myalgias. Negative for arthralgias, back pain, gait problem, joint swelling and neck pain.  Skin: Negative for wound.  Neurological: Negative for weakness and numbness.     Physical Exam Updated Vital Signs BP 98/70 (BP Location: Left Arm)   Pulse 69   Temp 98.4 F (36.9 C) (Oral)   Resp 17   Ht 6\' 1"  (1.854 m)   Wt 188 lb (85.3 kg)   SpO2 98%   BMI 24.80 kg/m   Physical Exam  Constitutional: He appears well-developed and well-nourished.  HENT:  Head: Normocephalic and atraumatic.  Eyes: Conjunctivae are normal.  Neck: Neck supple.  Cardiovascular: Normal rate and regular rhythm.   No murmur heard. Pulmonary/Chest: Effort normal and breath sounds normal. No respiratory  distress.  Abdominal: Soft. There is no tenderness.  Musculoskeletal: He exhibits no edema.  Patient with mild erythema and swelling to the right 3rd  MCP area. Full range of motion of joints.  Neurological: He is alert.  Skin: Skin is warm and dry.  Psychiatric: He has a normal mood and affect.  Nursing note and vitals reviewed.    ED Treatments / Results   DIAGNOSTIC STUDIES: Oxygen Saturation is 98% on RA, normal by my interpretation.   COORDINATION OF CARE: 8:30 PM-Discussed next steps with pt. Pt verbalized understanding and is agreeable with the plan.    Procedures Procedures (including critical care time)  Medications Ordered in  ED Medications - No data to display   Initial Impression / Assessment and Plan / ED Course  I have reviewed the triage vital signs and the nursing notes.  Pertinent labs & imaging results that were available during my care of the patient were reviewed by me and considered in my medical decision making (see chart for details).     Patient seen and examined. Discussed with and seen by Dr. Fayrene Fearing. Updated on x-ray results.  Vital signs reviewed and are as follows: BP 104/73 (BP Location: Right Arm)   Pulse (!) 57   Temp 98.4 F (36.9 C) (Oral)   Resp 16   Ht 6\' 1"  (1.854 m)   Wt 85.3 kg (188 lb)   SpO2 100%   BMI 24.80 kg/m    Final Clinical Impressions(s) / ED Diagnoses   Final diagnoses:  Finger joint swelling, right   Patient with swelling after recent bee sting over 3rd MCP. Area is warm but does not appear particularly cellulitic. No streaking or lymphangitis. Patient with good range of motion, doubt septic arthritis. No history of gout. Treat conservatively with ice, NSAIDs, elevation.  New Prescriptions Discharge Medication List as of 02/15/2017 10:11 PM    START taking these medications   Details  naproxen (NAPROSYN) 500 MG tablet Take 1 tablet (500 mg total) by mouth 2 (two) times daily., Starting Sat 02/15/2017, Print       I personally performed the services described in this documentation, which was scribed in my presence. The recorded information has been reviewed and is accurate.     Renne Crigler, PA-C 02/25/17 1339    Rolland Porter, MD 03/04/17 [phone]

## 2017-02-15 NOTE — Discharge Instructions (Signed)
Please read and follow all provided instructions.  Your diagnoses today include:  1. Finger joint swelling, right     Tests performed today include:  An x-ray of the affected area - shows joint swelling, does NOT show any broken bones  Vital signs. See below for your results today.   Medications prescribed:   Naproxen - anti-inflammatory pain medication  Do not exceed 500mg  naproxen every 12 hours, take with food  You have been prescribed an anti-inflammatory medication or NSAID. Take with food. Take smallest effective dose for the shortest duration needed for your pain. Stop taking if you experience stomach pain or vomiting.   Take any prescribed medications only as directed.  Home care instructions:   Follow any educational materials contained in this packet  Follow R.I.C.E. Protocol:  R - rest your injury   I  - use ice on injury without applying directly to skin  C - compress injury with bandage or splint  E - elevate the injury as much as possible  Follow-up instructions: Please follow-up with your primary care provider or the provided orthopedic physician (bone specialist) if you continue to have significant pain in 1 week. In this case you may have a more severe injury that requires further care.   Return instructions:   Please return if your fingers are numb or tingling, appear gray or blue, or you have severe pain (also elevate the arm and loosen splint or wrap if you were given one)  Please return to the Emergency Department if you experience worsening symptoms.   Please return if you have any other emergent concerns.  Additional Information:  Your vital signs today were: BP 100/68 (BP Location: Right Arm)    Pulse (!) 55    Temp 98.4 F (36.9 C) (Oral)    Resp 16    Ht 6\' 1"  (1.854 m)    Wt 85.3 kg    SpO2 99%    BMI 24.80 kg/m  If your blood pressure (BP) was elevated above 135/85 this visit, please have this repeated by your doctor within one  month. --------------

## 2017-03-10 DIAGNOSIS — N390 Urinary tract infection, site not specified: Secondary | ICD-10-CM | POA: Diagnosis not present

## 2017-03-10 DIAGNOSIS — R310 Gross hematuria: Secondary | ICD-10-CM | POA: Diagnosis not present

## 2017-03-10 DIAGNOSIS — B961 Klebsiella pneumoniae [K. pneumoniae] as the cause of diseases classified elsewhere: Secondary | ICD-10-CM | POA: Diagnosis not present

## 2017-05-16 DIAGNOSIS — R310 Gross hematuria: Secondary | ICD-10-CM | POA: Diagnosis not present

## 2017-05-16 DIAGNOSIS — B961 Klebsiella pneumoniae [K. pneumoniae] as the cause of diseases classified elsewhere: Secondary | ICD-10-CM | POA: Diagnosis not present

## 2017-05-16 DIAGNOSIS — N390 Urinary tract infection, site not specified: Secondary | ICD-10-CM | POA: Diagnosis not present

## 2017-08-06 DIAGNOSIS — R310 Gross hematuria: Secondary | ICD-10-CM | POA: Diagnosis not present

## 2017-08-14 DIAGNOSIS — N401 Enlarged prostate with lower urinary tract symptoms: Secondary | ICD-10-CM | POA: Diagnosis not present

## 2017-08-14 DIAGNOSIS — N390 Urinary tract infection, site not specified: Secondary | ICD-10-CM | POA: Diagnosis not present

## 2017-08-14 DIAGNOSIS — B961 Klebsiella pneumoniae [K. pneumoniae] as the cause of diseases classified elsewhere: Secondary | ICD-10-CM | POA: Diagnosis not present

## 2017-08-14 DIAGNOSIS — R310 Gross hematuria: Secondary | ICD-10-CM | POA: Diagnosis not present

## 2017-08-15 ENCOUNTER — Other Ambulatory Visit: Payer: Self-pay | Admitting: Urology

## 2017-08-21 DIAGNOSIS — R310 Gross hematuria: Secondary | ICD-10-CM | POA: Diagnosis not present

## 2017-09-17 DIAGNOSIS — R310 Gross hematuria: Secondary | ICD-10-CM | POA: Diagnosis not present

## 2017-09-17 DIAGNOSIS — R3914 Feeling of incomplete bladder emptying: Secondary | ICD-10-CM | POA: Diagnosis not present

## 2017-09-17 DIAGNOSIS — N401 Enlarged prostate with lower urinary tract symptoms: Secondary | ICD-10-CM | POA: Diagnosis not present

## 2017-09-18 ENCOUNTER — Encounter (HOSPITAL_BASED_OUTPATIENT_CLINIC_OR_DEPARTMENT_OTHER): Payer: Self-pay | Admitting: *Deleted

## 2017-09-18 ENCOUNTER — Other Ambulatory Visit: Payer: Self-pay

## 2017-09-18 NOTE — Progress Notes (Signed)
NPO AFTER MIDNIGHT ARRIVE 1045 AM 09-22-17 [facility] , NO MEDS TO TAKE WENT OVER OVERNIGHT [facility] INSTRUCTIONS WITH WIFE EMILY, NO MEDS TO TAKE, NEEDS HEMAGLOBIN.

## 2017-09-22 ENCOUNTER — Encounter (HOSPITAL_BASED_OUTPATIENT_CLINIC_OR_DEPARTMENT_OTHER): Payer: Self-pay | Admitting: Anesthesiology

## 2017-09-22 ENCOUNTER — Ambulatory Visit (HOSPITAL_BASED_OUTPATIENT_CLINIC_OR_DEPARTMENT_OTHER): Payer: 59 | Admitting: Anesthesiology

## 2017-09-22 ENCOUNTER — Encounter (HOSPITAL_BASED_OUTPATIENT_CLINIC_OR_DEPARTMENT_OTHER)
Admission: RE | Disposition: A | Discharge status: Home / Self Care | Payer: Self-pay | Source: Ambulatory Visit | Attending: Urology

## 2017-09-22 ENCOUNTER — Observation Stay (HOSPITAL_BASED_OUTPATIENT_CLINIC_OR_DEPARTMENT_OTHER)
Admission: RE | Admit: 2017-09-22 | Discharge: 2017-09-23 | Disposition: A | Discharge status: Home / Self Care | Payer: 59 | Source: Ambulatory Visit | Attending: Urology | Admitting: Urology

## 2017-09-22 DIAGNOSIS — K625 Hemorrhage of anus and rectum: Secondary | ICD-10-CM | POA: Diagnosis not present

## 2017-09-22 DIAGNOSIS — Z87440 Personal history of urinary (tract) infections: Secondary | ICD-10-CM | POA: Insufficient documentation

## 2017-09-22 DIAGNOSIS — R338 Other retention of urine: Secondary | ICD-10-CM | POA: Insufficient documentation

## 2017-09-22 DIAGNOSIS — Z86010 Personal history of colonic polyps: Secondary | ICD-10-CM | POA: Insufficient documentation

## 2017-09-22 DIAGNOSIS — E538 Deficiency of other specified B group vitamins: Secondary | ICD-10-CM | POA: Diagnosis not present

## 2017-09-22 DIAGNOSIS — M19042 Primary osteoarthritis, left hand: Secondary | ICD-10-CM | POA: Diagnosis not present

## 2017-09-22 DIAGNOSIS — Z79899 Other long term (current) drug therapy: Secondary | ICD-10-CM | POA: Diagnosis not present

## 2017-09-22 DIAGNOSIS — N401 Enlarged prostate with lower urinary tract symptoms: Secondary | ICD-10-CM | POA: Diagnosis present

## 2017-09-22 DIAGNOSIS — N400 Enlarged prostate without lower urinary tract symptoms: Secondary | ICD-10-CM | POA: Diagnosis present

## 2017-09-22 DIAGNOSIS — Z8619 Personal history of other infectious and parasitic diseases: Secondary | ICD-10-CM | POA: Diagnosis not present

## 2017-09-22 DIAGNOSIS — M19041 Primary osteoarthritis, right hand: Secondary | ICD-10-CM | POA: Insufficient documentation

## 2017-09-22 DIAGNOSIS — R3914 Feeling of incomplete bladder emptying: Secondary | ICD-10-CM | POA: Diagnosis not present

## 2017-09-22 HISTORY — PX: TRANSURETHRAL RESECTION OF PROSTATE: SHX73

## 2017-09-22 HISTORY — DX: Unspecified osteoarthritis, unspecified site: M19.90

## 2017-09-22 HISTORY — DX: Family history of other specified conditions: Z84.89

## 2017-09-22 HISTORY — DX: Urinary tract infection, site not specified: N39.0

## 2017-09-22 HISTORY — DX: Deficiency of other specified B group vitamins: E53.8

## 2017-09-22 LAB — BASIC METABOLIC PANEL
ANION GAP: 3 — AB (ref 5–15)
BUN: 26 mg/dL — ABNORMAL HIGH (ref 6–20)
CALCIUM: 7 mg/dL — AB (ref 8.9–10.3)
CO2: 20 mmol/L — AB (ref 22–32)
Chloride: 117 mmol/L — ABNORMAL HIGH (ref 101–111)
Creatinine, Ser: 1.26 mg/dL — ABNORMAL HIGH (ref 0.61–1.24)
GFR, EST NON AFRICAN AMERICAN: 60 mL/min — AB (ref >60)
Glucose, Bld: 98 mg/dL (ref 65–99)
Potassium: 3.9 mmol/L (ref 3.5–5.1)
SODIUM: 140 mmol/L (ref 135–145)

## 2017-09-22 LAB — CBC
HEMATOCRIT: 24.3 % — AB (ref 39.0–52.0)
Hemoglobin: 7.8 g/dL — ABNORMAL LOW (ref 13.0–17.0)
MCH: 27.5 pg (ref 26.0–34.0)
MCHC: 32.1 g/dL (ref 30.0–36.0)
MCV: 85.6 fL (ref 78.0–100.0)
Platelets: 137 10*3/uL — ABNORMAL LOW (ref 150–400)
RBC: 2.84 MIL/uL — ABNORMAL LOW (ref 4.22–5.81)
RDW: 13.3 % (ref 11.5–15.5)
WBC: 3.7 10*3/uL — AB (ref 4.0–10.5)

## 2017-09-22 LAB — POCT HEMOGLOBIN-HEMACUE: Hemoglobin: 9.7 g/dL — ABNORMAL LOW (ref 13.0–17.0)

## 2017-09-22 SURGERY — TURP (TRANSURETHRAL RESECTION OF PROSTATE)
Anesthesia: General | Site: Prostate

## 2017-09-22 MED ORDER — FENTANYL CITRATE (PF) 100 MCG/2ML IJ SOLN
25.0000 ug | INTRAMUSCULAR | Status: DC | PRN
  Filled 2017-09-22: qty 1

## 2017-09-22 MED ORDER — CEFTRIAXONE SODIUM 2 G IJ SOLR
INTRAMUSCULAR | Status: AC
  Filled 2017-09-22: qty 2

## 2017-09-22 MED ORDER — DEXTROSE 5 % IV SOLN
2.0000 g | INTRAVENOUS | Status: DC
  Filled 2017-09-22: qty 2

## 2017-09-22 MED ORDER — DEXTROSE 5 % IV SOLN
INTRAVENOUS | Status: AC
  Filled 2017-09-22: qty 50

## 2017-09-22 MED ORDER — PROPOFOL 10 MG/ML IV BOLUS
INTRAVENOUS | Status: DC | PRN
  Administered 2017-09-22: 200 mg via INTRAVENOUS

## 2017-09-22 MED ORDER — LIDOCAINE 2% (20 MG/ML) 5 ML SYRINGE
INTRAMUSCULAR | Status: DC | PRN
  Administered 2017-09-22: 100 mg via INTRAVENOUS

## 2017-09-22 MED ORDER — DEXAMETHASONE SODIUM PHOSPHATE 10 MG/ML IJ SOLN
INTRAMUSCULAR | Status: AC
  Filled 2017-09-22: qty 1

## 2017-09-22 MED ORDER — DIPHENHYDRAMINE HCL 12.5 MG/5ML PO ELIX
12.5000 mg | ORAL_SOLUTION | Freq: Four times a day (QID) | ORAL | Status: DC | PRN
  Filled 2017-09-22: qty 10

## 2017-09-22 MED ORDER — DEXTROSE 5 % IV SOLN
2.0000 g | INTRAVENOUS | Status: AC
  Administered 2017-09-22: 2 g via INTRAVENOUS
  Filled 2017-09-22: qty 2

## 2017-09-22 MED ORDER — LIDOCAINE 2% (20 MG/ML) 5 ML SYRINGE
INTRAMUSCULAR | Status: AC
  Filled 2017-09-22: qty 10

## 2017-09-22 MED ORDER — HYDROMORPHONE HCL 1 MG/ML IJ SOLN
0.5000 mg | INTRAMUSCULAR | Status: DC | PRN
  Filled 2017-09-22: qty 1

## 2017-09-22 MED ORDER — FENTANYL CITRATE (PF) 100 MCG/2ML IJ SOLN
INTRAMUSCULAR | Status: AC
  Filled 2017-09-22: qty 2

## 2017-09-22 MED ORDER — MIDAZOLAM HCL 5 MG/5ML IJ SOLN
INTRAMUSCULAR | Status: DC | PRN
  Administered 2017-09-22: 2 mg via INTRAVENOUS

## 2017-09-22 MED ORDER — DIPHENHYDRAMINE HCL 50 MG/ML IJ SOLN
12.5000 mg | Freq: Four times a day (QID) | INTRAMUSCULAR | Status: DC | PRN
  Filled 2017-09-22: qty 0.5

## 2017-09-22 MED ORDER — SODIUM CHLORIDE 0.9 % IV SOLN
INTRAVENOUS | Status: DC
  Administered 2017-09-22: 23:00:00 via INTRAVENOUS
  Filled 2017-09-22 (×2): qty 1000

## 2017-09-22 MED ORDER — PROPOFOL 10 MG/ML IV BOLUS
INTRAVENOUS | Status: AC
  Filled 2017-09-22: qty 20

## 2017-09-22 MED ORDER — ONDANSETRON HCL 4 MG/2ML IJ SOLN
4.0000 mg | INTRAMUSCULAR | Status: DC | PRN
  Filled 2017-09-22: qty 2

## 2017-09-22 MED ORDER — ONDANSETRON HCL 4 MG/2ML IJ SOLN
INTRAMUSCULAR | Status: DC | PRN
  Administered 2017-09-22: 4 mg via INTRAVENOUS

## 2017-09-22 MED ORDER — SODIUM CHLORIDE 0.9 % IR SOLN
Status: DC | PRN
  Administered 2017-09-22 (×2): 6000 mL via INTRAVESICAL

## 2017-09-22 MED ORDER — MIDAZOLAM HCL 2 MG/2ML IJ SOLN
INTRAMUSCULAR | Status: AC
  Filled 2017-09-22: qty 2

## 2017-09-22 MED ORDER — FENTANYL CITRATE (PF) 100 MCG/2ML IJ SOLN
INTRAMUSCULAR | Status: DC | PRN
  Administered 2017-09-22 (×3): 25 ug via INTRAVENOUS
  Administered 2017-09-22: 50 ug via INTRAVENOUS
  Administered 2017-09-22 (×3): 25 ug via INTRAVENOUS

## 2017-09-22 MED ORDER — SODIUM CHLORIDE 0.9 % IV SOLN
INTRAVENOUS | Status: DC
  Administered 2017-09-22 (×3): via INTRAVENOUS
  Filled 2017-09-22: qty 1000

## 2017-09-22 MED ORDER — FINASTERIDE 5 MG PO TABS
5.0000 mg | ORAL_TABLET | Freq: Every evening | ORAL | Status: DC
  Filled 2017-09-22: qty 1

## 2017-09-22 MED ORDER — ZOLPIDEM TARTRATE 5 MG PO TABS
5.0000 mg | ORAL_TABLET | Freq: Every evening | ORAL | Status: DC | PRN
  Filled 2017-09-22: qty 1

## 2017-09-22 MED ORDER — ONDANSETRON HCL 4 MG/2ML IJ SOLN
INTRAMUSCULAR | Status: AC
  Filled 2017-09-22: qty 2

## 2017-09-22 MED ORDER — OXYCODONE-ACETAMINOPHEN 5-325 MG PO TABS
1.0000 | ORAL_TABLET | ORAL | Status: DC | PRN
  Filled 2017-09-22: qty 2

## 2017-09-22 MED ORDER — BELLADONNA ALKALOIDS-OPIUM 16.2-60 MG RE SUPP
1.0000 | Freq: Four times a day (QID) | RECTAL | Status: DC | PRN
  Filled 2017-09-22: qty 1

## 2017-09-22 MED ORDER — DEXAMETHASONE SODIUM PHOSPHATE 4 MG/ML IJ SOLN
INTRAMUSCULAR | Status: DC | PRN
  Administered 2017-09-22: 10 mg via INTRAVENOUS

## 2017-09-22 SURGICAL SUPPLY — 20 items
BAG DRAIN URO-CYSTO SKYTR STRL (DRAIN) ×3 IMPLANT
BAG URINE DRAINAGE (UROLOGICAL SUPPLIES) ×3 IMPLANT
BAG URINE LEG 19OZ MD ST LTX (BAG) IMPLANT
CATH FOLEY 3WAY 30CC 22F (CATHETERS) ×3 IMPLANT
CLOTH BEACON ORANGE TIMEOUT ST (SAFETY) ×3 IMPLANT
ELECT REM PT RETURN 9FT ADLT (ELECTROSURGICAL)
ELECTRODE REM PT RTRN 9FT ADLT (ELECTROSURGICAL) IMPLANT
EVACUATOR MICROVAS BLADDER (UROLOGICAL SUPPLIES) IMPLANT
GLOVE BIO SURGEON STRL SZ8 (GLOVE) ×3 IMPLANT
GOWN STRL REUS W/TWL LRG LVL3 (GOWN DISPOSABLE) ×3 IMPLANT
IV NS IRRIG 3000ML ARTHROMATIC (IV SOLUTION) ×12 IMPLANT
KIT RM TURNOVER CYSTO AR (KITS) ×3 IMPLANT
LOOP CUT BIPOLAR 24F LRG (ELECTROSURGICAL) ×3 IMPLANT
MANIFOLD NEPTUNE II (INSTRUMENTS) ×3 IMPLANT
PACK CYSTO (CUSTOM PROCEDURE TRAY) ×3 IMPLANT
PLUG CATH AND CAP STER (CATHETERS) ×3 IMPLANT
SYR 30ML LL (SYRINGE) ×3 IMPLANT
SYRINGE IRR TOOMEY STRL 70CC (SYRINGE) ×3 IMPLANT
TUBE CONNECTING 12'X1/4 (SUCTIONS)
TUBE CONNECTING 12X1/4 (SUCTIONS) IMPLANT

## 2017-09-22 NOTE — Anesthesia Postprocedure Evaluation (Signed)
Anesthesia Post Note  Patient: Bradley Dominguez  Procedure(s) Performed: TRANSURETHRAL RESECTION OF THE PROSTATE (TURP) (N/A Prostate)     Anesthesia Post Evaluation  Last Vitals:  Vitals:   09/22/17 1053 09/22/17 1437  BP: 110/64   Pulse: (!) 59   Resp: 16   Temp: (!) 36.4 C (P) 36.5 C  SpO2: 99%     Last Pain:  Vitals:   09/22/17 1053  TempSrc: Oral                 Saydee Zolman

## 2017-09-22 NOTE — Transfer of Care (Signed)
Immediate Anesthesia Transfer of Care Note  Patient: Bradley BrunsCharles Dominguez  Procedure(s) Performed: Procedure(s) (LRB): TRANSURETHRAL RESECTION OF THE PROSTATE (TURP) (N/A)  Patient Location: PACU  Anesthesia Type: General  Level of Consciousness: awake, sedated, patient cooperative and responds to stimulation  Airway & Oxygen Therapy: Patient Spontanous Breathing and Patient connected to [place] oxygen  Post-op Assessment: Report given to PACU RN, Post -op Vital signs reviewed and stable and Patient moving all extremities  Post vital signs: Reviewed and stable  Complications: No apparent anesthesia complications

## 2017-09-22 NOTE — Op Note (Signed)
Preoperative diagnosis: BPH, with urinary retention  Postoperative diagnosis: Bsame  Procedure: 1 cystoscopy 2. Transurethral resection of the prostate  Attending: Wilkie AyePatrick Cereniti Curb  Anesthesia: General  Estimated blood loss: Minimal  Drains: 22 French foley  Specimens: 1. Prostate Chips  Antibiotics: Rocephin  Findings: Bilobar prostate enlargement. Ureteral orifices in normal anatomic location.   Indications: Patient is a 61 year old male with a history of BPH and elevated PVR.  After discussing treatment options, they decided proceed with transurethral resection of the prostate.  Procedure her in detail: The patient was brought to the operating room and a brief timeout was done to ensure correct patient, correct procedure, correct site.  General anesthesia was administered patient was placed in dorsal lithotomy position.  Their genitalia was then prepped and draped in usual sterile fashion.  A rigid 22 French cystoscope was passed in the urethra and the bladder.  Bladder was inspected and we noted no masses or lesions.  the ureteral orifices were in the normal orthotopic locations. removed the cystoscope and placed a resectoscope into the bladder. We then turned our attention to the prostate resection. Using the bipolar resectoscope we resected the median lobe first from the bladder neck to the verumontanum. We then started at the 12 oclock position on the left lobe and resection to the 6 o'clock position from the bladder neck to the verumontanum. We then did the same resection of the right lobe. Once the resection was complete we then cauterized individual bleeders. We then removed the prostate chips and sent them for pathology.  We then re-inspected the prostatic fossa and found no residual bleeding.  the bladder was then drained, a 22 French foley was placed and this concluded the procedure which was well tolerated by patient.  Complications: None  Condition: Stable, extubated,  transferred to PACU  Plan: Patient is admitted overnight with continuous bladder irrigation. If their urine is clear tomorrow they will be discharged home and followup in 5 days for foley catheter removal and pathology discussion.

## 2017-09-22 NOTE — Anesthesia Procedure Notes (Signed)
Procedure Name: LMA Insertion Date/Time: 09/22/2017 1:24 PM Performed by: Jessica PriestBeeson, Deyon Chizek C, CRNA Pre-anesthesia Checklist: Patient identified, Emergency Drugs available, Suction available and Patient being monitored Patient Re-evaluated:Patient Re-evaluated prior to induction Oxygen Delivery Method: Circle system utilized Preoxygenation: Pre-oxygenation with 100% oxygen Induction Type: IV induction Ventilation: Mask ventilation without difficulty LMA: LMA inserted LMA Size: 5.0 Number of attempts: 1 Airway Equipment and Method: Bite block Placement Confirmation: positive ETCO2 and breath sounds checked- equal and bilateral Tube secured with: Tape Dental Injury: Teeth and Oropharynx as per pre-operative assessment

## 2017-09-22 NOTE — H&P (Signed)
Urology Admission H&P  Chief Complaint: urinary retention  History of Present Illness: [name] is a 61yo with a hx of BPH and urinary retention who has failed medical therapy.  Past Medical History:  Diagnosis Date  . Arthritis    HANDS  . Family history of adverse reaction to anesthesia    FATHER SLOW TO AWAKEN, MOTHER POST OP N/V  . Frequent UTI   . History of colon polyps 2009  . History of shingles   . Plantar fasciitis   . Prostate hypertrophy   . Vitamin B 12 deficiency    Past Surgical History:  Procedure Laterality Date  . APPENDECTOMY    . CHOLECYSTECTOMY    . COLONSCOPY  AGE 4   WITH POLYP REMOVED  . SMALL INTESTINE SURGERY     1979    Home Medications:  Current Facility-Administered Medications  Medication Dose Route Frequency Provider Last Rate Last Dose  . 0.9 %  sodium chloride infusion   Intravenous Continuous Heather RobertsSinger, James, MD 50 mL/hr at 09/22/17 1158    . cefTRIAXone (ROCEPHIN) 2 g in dextrose 5 % 50 mL IVPB  2 g Intravenous 30 min Pre-Op Hasani Diemer, Mardene CelestePatrick L, MD       Allergies: No Known Allergies  Family History  Problem Relation Age of Onset  . Diabetes Unknown    Social History:  reports that  has never smoked. he has never used smokeless tobacco. He reports that he does not drink alcohol or use drugs.  Review of Systems  Genitourinary: Positive for frequency and urgency.  All other systems reviewed and are negative.   Physical Exam:  Vital signs in last 24 hours: Temp:  [97.5 F (36.4 C)] 97.5 F (36.4 C) (12/17 1053) Pulse Rate:  [59] 59 (12/17 1053) Resp:  [16] 16 (12/17 1053) BP: (110)/(64) 110/64 (12/17 1053) SpO2:  [99 %] 99 % (12/17 1053) Weight:  [84.6 kg (186 lb 8 oz)] 84.6 kg (186 lb 8 oz) (12/17 1053) Physical Exam  Constitutional: He is oriented to person, place, and time. He appears well-developed and well-nourished.  HENT:  Head: Normocephalic and atraumatic.  Eyes: EOM are normal. Pupils are equal, round, and  reactive to light.  Neck: Normal range of motion. No thyromegaly present.  Cardiovascular: Normal rate and regular rhythm.  Respiratory: Effort normal. No respiratory distress.  GI: Soft. He exhibits no distension.  Musculoskeletal: Normal range of motion. He exhibits no edema.  Neurological: He is alert and oriented to person, place, and time.  Skin: Skin is warm and dry.  Psychiatric: He has a normal mood and affect. His behavior is normal. Judgment and thought content normal.    Laboratory Data:  Results for orders placed or performed during the hospital encounter of 09/22/17 (from the past 24 hour(s))  Hemoglobin-hemacue, POC     Status: Abnormal   Collection Time: 09/22/17 12:02 PM  Result Value Ref Range   Hemoglobin 9.7 (L) 13.0 - 17.0 g/dL   No results found for this or any previous visit (from the past 240 hour(s)). Creatinine: No results for input(s): CREATININE in the last 168 hours. Baseline Creatinine: unknwon  Impression/Assessment:  61yo with BPH and urinary retention  Plan:  The risks/benefits/alternatives to TURP was explained to the patient and he understands and wishes to proceed with surgery  Bradley Dominguez 09/22/2017, 12:50 PM

## 2017-09-22 NOTE — Anesthesia Preprocedure Evaluation (Signed)
Anesthesia Evaluation  Patient identified by MRN, date of birth, ID band Patient awake    Reviewed: Allergy & Precautions, NPO status , Patient's Chart, lab work & pertinent test results  Airway Mallampati: II  TM Distance: >3 FB     Dental   Pulmonary    breath sounds clear to auscultation       Cardiovascular negative cardio ROS   Rhythm:Regular Rate:Normal     Neuro/Psych    GI/Hepatic negative GI ROS, Neg liver ROS,   Endo/Other  negative endocrine ROS  Renal/GU negative Renal ROS     Musculoskeletal  (+) Arthritis ,   Abdominal   Peds  Hematology   Anesthesia Other Findings   Reproductive/Obstetrics                             Anesthesia Physical Anesthesia Plan  ASA: II  Anesthesia Plan: General   Post-op Pain Management:    Induction: Intravenous  PONV Risk Score and Plan: 2 and Treatment may vary due to age or medical condition  Airway Management Planned: LMA  Additional Equipment:   Intra-op Plan:   Post-operative Plan: Extubation in OR  Informed Consent:   Dental advisory given  Plan Discussed with: CRNA and Anesthesiologist  Anesthesia Plan Comments:         Anesthesia Quick Evaluation

## 2017-09-23 ENCOUNTER — Encounter (HOSPITAL_BASED_OUTPATIENT_CLINIC_OR_DEPARTMENT_OTHER): Payer: Self-pay | Admitting: Urology

## 2017-09-23 DIAGNOSIS — N401 Enlarged prostate with lower urinary tract symptoms: Secondary | ICD-10-CM | POA: Diagnosis not present

## 2017-09-23 LAB — CBC
HCT: 25.7 % — ABNORMAL LOW (ref 39.0–52.0)
Hemoglobin: 8.3 g/dL — ABNORMAL LOW (ref 13.0–17.0)
MCH: 27.2 pg (ref 26.0–34.0)
MCHC: 32.3 g/dL (ref 30.0–36.0)
MCV: 84.3 fL (ref 78.0–100.0)
PLATELETS: 152 10*3/uL (ref 150–400)
RBC: 3.05 MIL/uL — AB (ref 4.22–5.81)
RDW: 13.2 % (ref 11.5–15.5)
WBC: 10.2 10*3/uL (ref 4.0–10.5)

## 2017-09-23 LAB — BASIC METABOLIC PANEL
Anion gap: 4 — ABNORMAL LOW (ref 5–15)
BUN: 22 mg/dL — AB (ref 6–20)
CO2: 20 mmol/L — ABNORMAL LOW (ref 22–32)
CREATININE: 1.02 mg/dL (ref 0.61–1.24)
Calcium: 7.7 mg/dL — ABNORMAL LOW (ref 8.9–10.3)
Chloride: 115 mmol/L — ABNORMAL HIGH (ref 101–111)
Glucose, Bld: 129 mg/dL — ABNORMAL HIGH (ref 65–99)
Potassium: 3.5 mmol/L (ref 3.5–5.1)
SODIUM: 139 mmol/L (ref 135–145)

## 2017-09-23 MED ORDER — OXYCODONE-ACETAMINOPHEN 5-325 MG PO TABS: 1.0000 | ORAL_TABLET | ORAL | 0 refills | Status: DC | PRN

## 2017-09-23 NOTE — Discharge Instructions (Signed)
Indwelling Urinary Catheter Care, Adult  Take good care of your catheter to keep it working and to prevent problems.  How to wear your catheter  Attach your catheter to your leg with tape (adhesive tape) or a leg strap. Make sure it is not too tight. If you use tape, remove any bits of tape that are already on the catheter.  How to wear a drainage bag  You should have:   A large overnight bag.   A small leg bag.    Overnight Bag  You may wear the overnight bag at any time. Always keep the bag below the level of your bladder but off the floor. When you sleep, put a clean plastic bag in a wastebasket. Then hang the bag inside the wastebasket.  Leg Bag  Never wear the leg bag at night. Always wear the leg bag below your knee. Keep the leg bag secure with a leg strap or tape.  How to care for your skin   Clean the skin around the catheter at least once every day.   Shower every day. Do not take baths.   Put creams, lotions, or ointments on your genital area only as told by your doctor.   Do not use powders, sprays, or lotions on your genital area.  How to clean your catheter and your skin  1. Wash your hands with soap and water.  2. Wet a washcloth in warm water and gentle (mild) soap.  3. Use the washcloth to clean the skin where the catheter enters your body. Clean downward and wipe away from the catheter in small circles. Do not wipe toward the catheter.  4. Pat the area dry with a clean towel. Make sure to clean off all soap.  How to care for your drainage bags  Empty your drainage bag when it is ?- full or at least 2-3 times a day. Replace your drainage bag once a month or sooner if it starts to smell bad or look dirty. Do not clean your drainage bag unless told by your doctor.  Emptying a drainage bag    Supplies Needed   Rubbing alcohol.   Gauze pad or cotton ball.   Tape or a leg strap.    Steps  1. Wash your hands with soap and water.  2. Separate (detach) the bag from your leg.  3. Hold the bag over  the toilet or a clean container. Keep the bag below your hips and bladder. This stops pee (urine) from going back into the tube.  4. Open the pour spout at the bottom of the bag.  5. Empty the pee into the toilet or container. Do not let the pour spout touch any surface.  6. Put rubbing alcohol on a gauze pad or cotton ball.  7. Use the gauze pad or cotton ball to clean the pour spout.  8. Close the pour spout.  9. Attach the bag to your leg with tape or a leg strap.  10. Wash your hands.    Changing a drainage bag  Supplies Needed   Alcohol wipes.   A clean drainage bag.   Adhesive tape or a leg strap.    Steps  1. Wash your hands with soap and water.  2. Separate the dirty bag from your leg.  3. Pinch the rubber catheter with your fingers so that pee does not spill out.  4. Separate the catheter tube from the drainage tube where these tubes connect (at the   connection valve). Do not let the tubes touch any surface.  5. Clean the end of the catheter tube with an alcohol wipe. Use a different alcohol wipe to clean the end of the drainage tube.  6. Connect the catheter tube to the drainage tube of the clean bag.  7. Attach the new bag to the leg with adhesive tape or a leg strap.  8. Wash your hands.    How to prevent infection and other problems   Never pull on your catheter or try to remove it. Pulling can damage tissue in your body.   Always wash your hands before and after touching your catheter.   If a leg strap gets wet, replace it with a dry one.   Drink enough fluids to keep your pee clear or pale yellow, or as told by your doctor.   Do not let the drainage bag or tubing touch the floor.   Wear cotton underwear.   If you are male, wipe from front to back after you poop (have a bowel movement).   Check on the catheter often to make sure it works and the tubing is not twisted.  Get help if:   Your pee is cloudy.   Your pee smells unusually bad.   Your pee is not draining into the bag.   Your  tube gets clogged.   Your catheter starts to leak.   Your bladder feels full.  Get help right away if:   You have redness, swelling, or pain where the catheter enters your body.   You have fluid, pus, or a bad smell coming from the area where the catheter enters your body.   The area where the catheter enters your body feels warm.   You have a fever.   You have pain in your:  ? Stomach (abdomen).  ? Legs.  ? Lower back.  ? Bladder.   You see blood fill the catheter.   Your pee is pink or red.   You feel sick to your stomach (nauseous).   You throw up (vomit).   You have chills.   Your catheter gets pulled out.  This information is not intended to replace advice given to you by your health care provider. Make sure you discuss any questions you have with your health care provider.  Document Released: 01/18/2013 Document Revised: 08/21/2016 Document Reviewed: 03/08/2014  Elsevier Interactive Patient Education  2018 [employer]

## 2017-09-24 NOTE — Progress Notes (Signed)
Late entry for 09/23/17 @ 0900:  DC instructions reviewed with pt and spouse. Discussed indwelling urinary catheter care, how to switch from standard drainage bag to leg bag, s/sx to monitor for and report to MD, pain mgmt, medications, and f/u appts. Methods: handout, teachback, verbal discussion, and demonstration. Pt and spouse verbalized understanding. All questions answered at the time of dc instruction review. Belongings (clothing) returned to pt. Pt's wife to drive pt home. Report given to oncoming nurse, Lamonte RicherSharon RN.  Marvia PicklesJames, Lejuan Botto Sara, RN

## 2017-09-25 NOTE — Discharge Summary (Signed)
Physician Discharge Summary  Patient ID: Bradley Dominguez MRN: [id number] DOB/AGE: March 23, 1956 61 y.o.  Admit date: 09/22/2017 DischFabienne Brunsarge date: 09/23/2017  Admission Diagnoses: BPH Discharge Diagnoses:  Active Problems:   BPH (benign prostatic hyperplasia)   Discharged Condition: good  Hospital Course: The patient tolerated the procedure well and was transferred to the floor on IV pain meds, IV fluid. On POD#1  pt was started on a regular diet, CBI was discontinued and they ambulated in the halls. Prior to discharge the pt was tolerating a regular diet, pain was controlled on PO pain meds, they were ambulating without difficulty, and they had normal bowel function.   Consults: None  Significant Diagnostic Studies: none  Treatments: surgery: TURP  Discharge Exam: Blood pressure (!) 113/56, pulse 61, temperature 97.9 F (36.6 C), temperature source Oral, resp. rate 18, height 6\' 1"  (1.854 m), weight 84.6 kg (186 lb 8 oz), SpO2 100 %. General appearance: alert, cooperative and appears stated age Head: Normocephalic, without obvious abnormality, atraumatic Nose: Nares normal. Septum midline. Mucosa normal. No drainage or sinus tenderness. Neck: no adenopathy, no carotid bruit, no JVD, supple, symmetrical, trachea midline and thyroid not enlarged, symmetric, no tenderness/mass/nodules Resp: clear to auscultation bilaterally Chest wall: no tenderness Cardio: regular rate and rhythm, S1, S2 normal, no murmur, click, rub or gallop GI: soft, non-tender; bowel sounds normal; no masses,  no organomegaly Extremities: extremities normal, atraumatic, no cyanosis or edema Neurologic: Grossly normal  Disposition: 01-Home or Self Care   Allergies as of 09/23/2017   No Known Allergies     Medication List    STOP taking these medications   tamsulosin 0.4 MG Caps capsule Commonly known as:  FLOMAX     TAKE these medications   acetaminophen 500 MG tablet Commonly known as:   TYLENOL Take 1,000 mg by mouth every 6 (six) hours as needed.   ciprofloxacin 500 MG tablet Commonly known as:  CIPRO Take 500 mg by mouth 2 (two) times daily. FILLED 08-21-17 X 7 DAYS   finasteride 5 MG tablet Commonly known as:  PROSCAR Take 5 mg by mouth every evening.   naproxen 500 MG tablet Commonly known as:  NAPROSYN Take 1 tablet (500 mg total) by mouth 2 (two) times daily. What changed:  when to take this   oxyCODONE-acetaminophen 5-325 MG tablet Commonly known as:  PERCOCET/ROXICET Take 1-2 tablets by mouth every 4 (four) hours as needed for moderate pain.   VITAMIN B 12 PO Take 2,000 mg by mouth.      Follow-up Information    Lakshya Mcgillicuddy, Mardene CelestePatrick L, MD. Call on 09/26/2017.   Specialty:  Urology Why:  voiding trial Contact information: [address] [place] [address] [phone]           Signed: Wilkie Ayeatrick Kaydra Borgen 09/25/2017, 6:41 AM

## 2017-10-01 DIAGNOSIS — R310 Gross hematuria: Secondary | ICD-10-CM | POA: Diagnosis not present

## 2017-10-17 DIAGNOSIS — D649 Anemia, unspecified: Secondary | ICD-10-CM | POA: Diagnosis not present

## 2017-10-17 DIAGNOSIS — E538 Deficiency of other specified B group vitamins: Secondary | ICD-10-CM | POA: Diagnosis not present

## 2017-10-17 DIAGNOSIS — N401 Enlarged prostate with lower urinary tract symptoms: Secondary | ICD-10-CM | POA: Diagnosis not present

## 2017-11-14 DIAGNOSIS — D649 Anemia, unspecified: Secondary | ICD-10-CM | POA: Diagnosis not present

## 2018-01-29 DIAGNOSIS — E538 Deficiency of other specified B group vitamins: Secondary | ICD-10-CM | POA: Diagnosis not present

## 2018-01-29 DIAGNOSIS — R82998 Other abnormal findings in urine: Secondary | ICD-10-CM | POA: Diagnosis not present

## 2018-01-29 DIAGNOSIS — Z0000 Encounter for general adult medical examination without abnormal findings: Secondary | ICD-10-CM | POA: Diagnosis not present

## 2018-02-05 DIAGNOSIS — Z1389 Encounter for screening for other disorder: Secondary | ICD-10-CM | POA: Diagnosis not present

## 2018-02-05 DIAGNOSIS — N401 Enlarged prostate with lower urinary tract symptoms: Secondary | ICD-10-CM | POA: Diagnosis not present

## 2018-02-05 DIAGNOSIS — D6489 Other specified anemias: Secondary | ICD-10-CM | POA: Diagnosis not present

## 2018-02-05 DIAGNOSIS — R002 Palpitations: Secondary | ICD-10-CM | POA: Diagnosis not present

## 2018-02-05 DIAGNOSIS — E876 Hypokalemia: Secondary | ICD-10-CM | POA: Diagnosis not present

## 2018-02-05 DIAGNOSIS — Z0000 Encounter for general adult medical examination without abnormal findings: Secondary | ICD-10-CM | POA: Diagnosis not present

## 2018-02-12 DIAGNOSIS — N401 Enlarged prostate with lower urinary tract symptoms: Secondary | ICD-10-CM | POA: Diagnosis not present

## 2018-02-12 DIAGNOSIS — R3914 Feeling of incomplete bladder emptying: Secondary | ICD-10-CM | POA: Diagnosis not present

## 2018-10-27 DIAGNOSIS — R002 Palpitations: Secondary | ICD-10-CM | POA: Diagnosis not present

## 2018-10-27 DIAGNOSIS — R899 Unspecified abnormal finding in specimens from other organs, systems and tissues: Secondary | ICD-10-CM | POA: Diagnosis not present

## 2018-10-27 DIAGNOSIS — Z6826 Body mass index (BMI) 26.0-26.9, adult: Secondary | ICD-10-CM | POA: Diagnosis not present

## 2018-11-25 ENCOUNTER — Encounter: Payer: Self-pay | Admitting: Cardiology

## 2018-11-25 DIAGNOSIS — R7989 Other specified abnormal findings of blood chemistry: Secondary | ICD-10-CM

## 2018-11-25 HISTORY — DX: Other specified abnormal findings of blood chemistry: R79.89

## 2018-11-25 NOTE — Progress Notes (Signed)
Subjective:  Primary Physician:  Gaspar Garbeisovec, Richard W, MD  Patient ID: Bradley Dominguez, male    DOB: January 26, 1956, 63 y.o.   MRN: [id number]  No chief complaint on file.   HPI: Bradley Dominguez  is a 63 y.o. male  with an elevated proBNP level.   The patient presents to the office due to an elevated proBNP Level of 254, that was measued at work for insurance reasons. 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 He walks on average about [phone] steps a day.   Patient has a burning sensation in his left chest, about 2 seconds, when he is active only, he is not sure if its muscular.   Has that sensation a couple times a day. The patient otherwise has no further complaints. He does not have any palpitations, edema or claudication symptoms. He denies any shortness of breath, dizziness or syncope, nausea or vomiting. He is a non-smoker, doesn't drink any alcohol and does not take any illicit drugs.   Past Medical History:  Diagnosis Date  . Arthritis    HANDS  . Elevated brain natriuretic peptide (BNP) level 11/25/2018  . Family history of adverse reaction to anesthesia    FATHER SLOW TO AWAKEN, MOTHER POST OP N/V  . Frequent UTI   . History of colon polyps 2009  . History of shingles   . Plantar fasciitis   . Prostate hypertrophy   . Vitamin B 12 deficiency     Past Surgical History:  Procedure Laterality Date  . APPENDECTOMY    . CHOLECYSTECTOMY    . COLONSCOPY  AGE 70   WITH POLYP REMOVED  . SMALL INTESTINE SURGERY     1979  . TRANSURETHRAL RESECTION OF PROSTATE N/A 09/22/2017   Procedure: TRANSURETHRAL RESECTION OF THE PROSTATE (TURP);  Surgeon: Malen GauzeMcKenzie, Patrick L, MD;  [facility] [place];  Service: Urology;  Laterality: N/A;    Social History   Socioeconomic History  . Marital status: Married    Spouse name: Not on file  . Number of children: Not on file  . Years of education: Not on file  .  Highest education level: Not on file  Occupational History  . Occupation: Teacher, musicwarehouse maanager  Social Needs  . Financial resource strain: Not on file  . Food insecurity:    Worry: Not on file    Inability: Not on file  . Transportation needs:    Medical: Not on file    Non-medical: Not on file  Tobacco Use  . Smoking status: Never Smoker  . Smokeless tobacco: Never Used  Substance and Sexual Activity  . Alcohol use: No    Alcohol/week: 0.0 standard drinks  . Drug use: No  . Sexual activity: Not on file  Lifestyle  . Physical activity:    Days per week: Not on file    Minutes per session: Not on file  . Stress: Not on file  Relationships  . Social connections:    Talks on phone: Not on file    Gets together: Not on file    Attends religious service: Not on file    Active member of club or organization: Not on file    Attends meetings of clubs or organizations: Not on file    Relationship status: Not on file  . Intimate partner violence:    Fear of current or ex partner: Not on file    Emotionally abused: Not  on file    Physically abused: Not on file    Forced sexual activity: Not on file  Other Topics Concern  . Not on file  Social History Narrative  . Not on file    Current Outpatient Medications on File Prior to Visit  Medication Sig Dispense Refill  . acetaminophen (TYLENOL) 500 MG tablet Take 1,000 mg by mouth every 6 (six) hours as needed.    . ciprofloxacin (CIPRO) 500 MG tablet Take 500 mg by mouth 2 (two) times daily. FILLED 08-21-17 X 7 DAYS    . Cyanocobalamin (VITAMIN B 12 PO) Take 2,000 mg by mouth.    . finasteride (PROSCAR) 5 MG tablet Take 5 mg by mouth every evening.     . naproxen (NAPROSYN) 500 MG tablet Take 1 tablet (500 mg total) by mouth 2 (two) times daily. (Patient taking differently: Take 500 mg by mouth every evening. ) 20 tablet 0  . oxyCODONE-acetaminophen (PERCOCET/ROXICET) 5-325 MG tablet Take 1-2 tablets by mouth every 4 (four) hours as  needed for moderate pain. 30 tablet 0   No current facility-administered medications on file prior to visit.      Review of Systems  Constitutional: Negative for diaphoresis, fever, malaise/fatigue and weight loss.  HENT: Negative for sore throat.   Eyes: Negative.   Respiratory: Negative for cough and shortness of breath.   Cardiovascular: Negative for chest pain, palpitations, claudication and leg swelling.  Gastrointestinal: Negative for abdominal pain, constipation, diarrhea, nausea and vomiting.  Genitourinary: Negative.   Musculoskeletal: Negative for falls.  Skin: Negative.   Neurological: Negative for dizziness and headaches.  Endo/Heme/Allergies: Negative.   Psychiatric/Behavioral: Negative for depression, substance abuse and suicidal ideas. The patient is not nervous/anxious.        Objective:   Today's Vitals   11/26/18 0941  BP: 118/74  Pulse: 62  SpO2: 99%  Weight: 192 lb 14.4 oz (87.5 kg)  Height: 6\' 1"  (1.854 m)   Body mass index is 25.45 kg/m.   Physical Exam  Constitutional: He is oriented to person, place, and time. He appears well-developed.  HENT:  Head: Normocephalic.  Cardiovascular: Normal rate, regular rhythm, normal heart sounds, intact distal pulses and normal pulses.  No murmur heard. Pulmonary/Chest: Effort normal and breath sounds normal. He has no wheezes. He has no rales.  Abdominal: Soft. Normal aorta and bowel sounds are normal. He exhibits no distension. There is no abdominal tenderness.  Neurological: He is alert and oriented to person, place, and time.  Skin: Skin is warm and dry.    Assessment & Recommendations:   1. Elevated brain natriuretic peptide (BNP) level EKG 11/26/18/20: Sinus bradycardia at rate of 54 bpm, normal axis.  Early repolarization.  Normal EKG.  Recommendation: Patient completely asymptomatic, does heavy manual labor without any symptoms of chest pain, dyspnea.  Physical examination is unremarkable and  normal.  There is no clinical evidence of congestive heart failure.  EKG is essentially normal.  Past examination is also normal.  Elevated proBNP, LVH by EKG, marked sinus bradycardia and sinus arrest, right ventricular strain are all normal findings in patients who are very athletic or do heavy manual labour.  He is applying for life insurance, as per the guidelines I'll obtain an echocardiogram to exclude any structural heart disease.  I'll also set him up for a routine treadmill exercise stress test.  Overall I think he is extremely low risk from cardiac standpoint.  Unless the stress test on the echocardiogram is abnormal,  I'll see him back on a p.r.n. basis. 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  He is a nonsmoker, does not drink alcohol, does not use any illicit drugs.  It was a pleasure to see him today.  Yates Decamp, MD, [facility] 11/26/2018, 11:57 AM [facility] [place] Pager: [phone] Office: [phone] If no answer Cell [phone]

## 2018-11-26 ENCOUNTER — Encounter: Payer: Self-pay | Admitting: Cardiology

## 2018-11-26 ENCOUNTER — Ambulatory Visit: Payer: 59 | Admitting: Cardiology

## 2018-11-26 DIAGNOSIS — R7989 Other specified abnormal findings of blood chemistry: Secondary | ICD-10-CM

## 2018-11-26 DIAGNOSIS — I509 Heart failure, unspecified: Secondary | ICD-10-CM

## 2018-11-26 HISTORY — DX: Heart failure, unspecified: I50.9

## 2018-12-02 ENCOUNTER — Ambulatory Visit: Payer: 59

## 2018-12-02 DIAGNOSIS — R7989 Other specified abnormal findings of blood chemistry: Secondary | ICD-10-CM

## 2018-12-11 ENCOUNTER — Ambulatory Visit (INDEPENDENT_AMBULATORY_CARE_PROVIDER_SITE_OTHER): Payer: 59 | Admitting: Cardiology

## 2018-12-11 DIAGNOSIS — R7989 Other specified abnormal findings of blood chemistry: Secondary | ICD-10-CM | POA: Diagnosis not present

## 2018-12-14 ENCOUNTER — Telehealth: Payer: Self-pay | Admitting: Cardiology

## 2018-12-16 NOTE — Telephone Encounter (Signed)
Normal echo.

## 2018-12-21 ENCOUNTER — Telehealth: Payer: Self-pay

## 2018-12-21 NOTE — Telephone Encounter (Signed)
Did you read the echo report  Essentially normal echocardiogram.

## 2018-12-21 NOTE — Telephone Encounter (Signed)
Spouse called wanting to know echo results done 3/6. I see results but no annotation. Please advise.//ah

## 2018-12-22 NOTE — Telephone Encounter (Signed)
Irving Burton spouse aware of results.//ah

## 2019-02-04 DIAGNOSIS — Z0000 Encounter for general adult medical examination without abnormal findings: Secondary | ICD-10-CM | POA: Diagnosis not present

## 2019-02-04 DIAGNOSIS — R82998 Other abnormal findings in urine: Secondary | ICD-10-CM | POA: Diagnosis not present

## 2019-11-13 ENCOUNTER — Encounter (HOSPITAL_BASED_OUTPATIENT_CLINIC_OR_DEPARTMENT_OTHER): Payer: Self-pay

## 2019-11-13 ENCOUNTER — Other Ambulatory Visit: Payer: Self-pay

## 2019-11-13 ENCOUNTER — Emergency Department (HOSPITAL_BASED_OUTPATIENT_CLINIC_OR_DEPARTMENT_OTHER): Payer: 59

## 2019-11-13 ENCOUNTER — Emergency Department (HOSPITAL_BASED_OUTPATIENT_CLINIC_OR_DEPARTMENT_OTHER)
Admission: EM | Admit: 2019-11-13 | Discharge: 2019-11-13 | Disposition: A | Discharge status: Home / Self Care | Payer: 59 | Attending: Emergency Medicine | Admitting: Emergency Medicine

## 2019-11-13 DIAGNOSIS — Y9289 Other specified places as the place of occurrence of the external cause: Secondary | ICD-10-CM | POA: Insufficient documentation

## 2019-11-13 DIAGNOSIS — S61512A Laceration without foreign body of left wrist, initial encounter: Secondary | ICD-10-CM

## 2019-11-13 DIAGNOSIS — Y999 Unspecified external cause status: Secondary | ICD-10-CM | POA: Insufficient documentation

## 2019-11-13 DIAGNOSIS — Z23 Encounter for immunization: Secondary | ICD-10-CM | POA: Diagnosis not present

## 2019-11-13 DIAGNOSIS — I509 Heart failure, unspecified: Secondary | ICD-10-CM | POA: Diagnosis not present

## 2019-11-13 DIAGNOSIS — W298XXA Contact with other powered powered hand tools and household machinery, initial encounter: Secondary | ICD-10-CM | POA: Diagnosis not present

## 2019-11-13 DIAGNOSIS — Y9389 Activity, other specified: Secondary | ICD-10-CM | POA: Insufficient documentation

## 2019-11-13 DIAGNOSIS — Z79899 Other long term (current) drug therapy: Secondary | ICD-10-CM | POA: Insufficient documentation

## 2019-11-13 DIAGNOSIS — S61522A Laceration with foreign body of left wrist, initial encounter: Secondary | ICD-10-CM | POA: Insufficient documentation

## 2019-11-13 MED ORDER — TETANUS-DIPHTH-ACELL PERTUSSIS 5-2.5-18.5 LF-MCG/0.5 IM SUSP
0.5000 mL | Freq: Once | INTRAMUSCULAR | Status: AC
  Administered 2019-11-13: 0.5 mL via INTRAMUSCULAR
  Filled 2019-11-13: qty 0.5

## 2019-11-13 MED ORDER — CEPHALEXIN 500 MG PO CAPS: 500.0000 mg | ORAL_CAPSULE | Freq: Three times a day (TID) | ORAL | 0 refills | Status: AC

## 2019-11-13 MED ORDER — LIDOCAINE-EPINEPHRINE (PF) 2 %-1:200000 IJ SOLN
10.0000 mL | Freq: Once | INTRAMUSCULAR | Status: AC
  Administered 2019-11-13: 10 mL
  Filled 2019-11-13: qty 10

## 2019-11-13 MED ORDER — CEFAZOLIN SODIUM-DEXTROSE 1-4 GM/50ML-% IV SOLN
1.0000 g | Freq: Once | INTRAVENOUS | Status: AC
  Administered 2019-11-13: 12:00:00 1 g via INTRAVENOUS
  Filled 2019-11-13: qty 50

## 2019-11-13 NOTE — ED Triage Notes (Signed)
Pt arrives ambulatory to ED with c/o laceration to left wrist with a metal grinder.

## 2019-11-13 NOTE — Discharge Instructions (Signed)
Please read instructions below.  Keep your wound clean and covered. In 24 hours, you can get your wound wet; gently clean it with soap and water, pat it dry, and reapply a clean bandage. You can take over-the-counter medication as needed for pain Call the hand surgeon's office, Dr. Roney Mans, first thing Monday morning to schedule an appointment with him on Tuesday. It is important you have close follow up with him, as we discussed increased risk of infection given the location, depth, and contamination of your wound. Return to the ER sooner for fever, pus draining from wound, increasing redness or pain, or new or worsening symptoms.

## 2019-11-13 NOTE — ED Notes (Signed)
Called Dr Roney Mans office for (PA) Roxan Hockey for consult

## 2019-11-13 NOTE — ED Provider Notes (Signed)
[facility] EMERGENCY DEPARTMENT Provider Note   CSN: [id number] Arrival date & time: 11/13/19  0941     History Chief Complaint  Patient presents with  . Extremity Laceration    Bradley Dominguez is a 64 y.o. male presenting to the emergency department with acute laceration that occurred prior to arrival.  Patient states he was using a metal grinder and it kicked back on him, causing laceration to the left dorsal ulnar wrist.  Reports initially it bled quite a bit, however stopped with direct pressure.  He is complaining of minimal pain, denies numbness or tingling to hand.  His last tetanus immunization is unknown.  Denies history of immunocompromise. Not on anticoagulation.  The history is provided by the patient.       Past Medical History:  Diagnosis Date  . Arthritis    HANDS  . CHF (congestive heart failure) (HCC) 11/26/2018  . Elevated brain natriuretic peptide (BNP) level 11/25/2018  . Family history of adverse reaction to anesthesia    FATHER SLOW TO AWAKEN, MOTHER POST OP N/V  . Frequent UTI   . History of colon polyps 2009  . History of shingles   . Plantar fasciitis   . Prostate hypertrophy   . Vitamin B 12 deficiency     Patient Active Problem List   Diagnosis Date Noted  . BPH (benign prostatic hyperplasia) 09/22/2017  . Hx of colonic polyps 01/18/2015  . Rectal bleeding 01/18/2015    Past Surgical History:  Procedure Laterality Date  . APPENDECTOMY    . CHOLECYSTECTOMY    . COLONSCOPY  AGE 47   WITH POLYP REMOVED  . SMALL INTESTINE SURGERY     1979  . TRANSURETHRAL RESECTION OF PROSTATE N/A 09/22/2017   Procedure: TRANSURETHRAL RESECTION OF THE PROSTATE (TURP);  Surgeon: Malen Gauze, MD;  Location: [facility];  Service: Urology;  Laterality: N/A;       Family History  Problem Relation Age of Onset  . Diabetes Other   . Heart attack Father     Social History   Tobacco Use  . Smoking status: Never Smoker  .  Smokeless tobacco: Never Used  Substance Use Topics  . Alcohol use: No    Alcohol/week: 0.0 standard drinks  . Drug use: No    Home Medications Prior to Admission medications   Medication Sig Start Date End Date Taking? Authorizing Provider  acetaminophen (TYLENOL) 500 MG tablet Take 1,000 mg by mouth every 6 (six) hours as needed.    [provider]  Ascorbic Acid (VITAMIN C) 100 MG tablet Take 1,000 mg by mouth daily.    [provider]  cephALEXin (KEFLEX) 500 MG capsule Take 1 capsule (500 mg total) by mouth 3 (three) times daily for 7 days. 11/13/19 11/20/19  Elizah Lydon, [place] N, PA-C  Cyanocobalamin (VITAMIN B 12 PO) Take 2,000 mg by mouth.    [provider]  naproxen (NAPROSYN) 500 MG tablet Take 1 tablet (500 mg total) by mouth 2 (two) times daily. Patient taking differently: Take 500 mg by mouth as needed.  02/15/17   Renne Crigler, PA-C  oxyCODONE-acetaminophen (PERCOCET/ROXICET) 5-325 MG tablet Take 1-2 tablets by mouth every 4 (four) hours as needed for moderate pain. Patient not taking: Reported on 11/26/2018 09/23/17   Malen Gauze, MD    Allergies    Patient has no known allergies.  Review of Systems   Review of Systems  Skin: Positive for wound.  Neurological: Negative for  weakness and numbness.  All other systems reviewed and are negative.   Physical Exam Updated Vital Signs BP 111/72 (BP Location: Right Arm)   Pulse (!) 50   Temp 97.6 F (36.4 C) (Oral)   Resp 14   Ht 6\' 1"  (1.854 m)   Wt 83.9 kg   SpO2 100%   BMI 24.41 kg/m   Physical Exam Vitals and nursing note reviewed.  Constitutional:      General: He is not in acute distress.    Appearance: He is well-developed.  HENT:     Head: Normocephalic and atraumatic.  Eyes:     Conjunctiva/sclera: Conjunctivae normal.  Cardiovascular:     Rate and Rhythm: Normal rate.  Pulmonary:     Effort: Pulmonary effort is normal.  Abdominal:     Palpations: Abdomen is soft.    Musculoskeletal:     Comments: Left dorsal ulnar wrist with 4cm laceration. Not actively bleeding. Bone is visible at base of wound, with flecks of dirt/metal shavings. There is also a tendon visible at the edge of the wound, however it appears to be intact without injury. No obvious vascular injury. Normal sensation to left digits. Strong ulnar deviation against resistance as well as extension of the wrist against resistance.  Skin:    General: Skin is warm.  Neurological:     Mental Status: He is alert.  Psychiatric:        Behavior: Behavior normal.     ED Results / Procedures / Treatments   Labs (all labs ordered are listed, but only abnormal results are displayed) Labs Reviewed - No data to display  EKG None  Radiology DG Wrist Complete Left  Result Date: 11/13/2019 CLINICAL DATA:  Laceration to MEDIAL LEFT wrist. EXAM: LEFT WRIST - COMPLETE 3+ VIEW COMPARISON:  None. FINDINGS: No acute fracture, subluxation or dislocation. Soft tissue defect along the MEDIAL wrist is noted compatible with known laceration. At least 4 tiny hyperdense structures along the laceration site noted. No focal bony lesions are present. Vascular calcifications are noted. IMPRESSION: 1. Soft tissue laceration along the MEDIAL wrist with at least 4 tiny hyperdense structures along the laceration site. Correlate clinically. 2. No acute bony abnormality. Electronically Signed   By: 01/11/2020 M.D.   On: 11/13/2019 10:29    Procedures .04/06/2021Laceration Repair  Date/Time: 11/13/2019 11:34 AM Performed by: Jaystin Mcgarvey, 01/11/2020 N, PA-C Authorized by: Aaryn Parrilla, [place] N, PA-C   Consent:    Consent obtained:  Verbal   Consent given by:  Patient   Risks discussed:  Infection, pain, retained foreign body and need for additional repair   Alternatives discussed:  Referral Anesthesia (see MAR for exact dosages):    Anesthesia method:  Local infiltration   Local anesthetic:  Lidocaine 2% WITH epi Laceration details:     Location:  Hand   Hand location:  L wrist   Length (cm):  4 Repair type:    Repair type:  Intermediate Pre-procedure details:    Preparation:  Patient was prepped and draped in usual sterile fashion and imaging obtained to evaluate for foreign bodies Exploration:    Hemostasis achieved with:  Direct pressure   Wound exploration: wound explored through full range of motion and entire depth of wound probed and visualized     Wound extent: foreign bodies/material     Wound extent: no tendon damage noted     Foreign bodies/material:  2 large linear flecks of metal (1cm in length, 2cm in length) and multiple  small flecks of dirt/metal.   Contaminated: yes   Treatment:    Area cleansed with:  Betadine and saline   Amount of cleaning:  Extensive   Irrigation volume:  1L sterile water, betadine   Irrigation method:  Pressure wash   Visualized foreign bodies/material removed: yes   Skin repair:    Repair method:  Sutures   Suture size:  4-0   Suture material:  Nylon   Suture technique:  Simple interrupted   Number of sutures:  4 Approximation:    Approximation:  Close Post-procedure details:    Dressing:  Non-adherent dressing   Patient tolerance of procedure:  Tolerated well, no immediate complications Comments:     Per hand specialist recommendation, wound irrigated well, removed as much dirt/metal as possible. Loosely closed with sutures   (including critical care time)  Medications Ordered in ED Medications  ceFAZolin (ANCEF) IVPB 1 g/50 mL premix (1 g Intravenous New Bag/Given 11/13/19 1133)  lidocaine-EPINEPHrine (XYLOCAINE W/EPI) 2 %-1:200000 (PF) injection 10 mL (10 mLs Infiltration Given by Other 11/13/19 1040)  Tdap (BOOSTRIX) injection 0.5 mL (0.5 mLs Intramuscular Given 11/13/19 1012)    ED Course  I have reviewed the triage vital signs and the nursing notes.  Pertinent labs & imaging results that were available during my care of the patient were reviewed by me and  considered in my medical decision making (see chart for details).  Clinical Course as of Nov 13 1147  Sat Nov 13, 2019  1113 Discussed pt with Dr. Jeannie Fend with hand. He reviewed images. Discussed concern for contamination and possible involvement with joint space. He recommends good wash out with iodine, IV abx, and discharge with PO abx. Pt to be seen Tuesday in clinic for close follow up. Pt agreeable with plan. Appreciate consult.    [JR]    Clinical Course User Index [JR] Demontez Novack, [place] N, PA-C   MDM Rules/Calculators/A&P                      Patient presenting with laceration to left wrist that occurred while using a metal grinder today.  Denies numbness or tingling to extremity.  He has preserved range of motion.  However, the wound appears quite contaminated with dirt and small flecks of metal.  X-ray is negative for osseous involvement. Concern for laceration communicating with wrist joint.  Consulted with hand surgeon, Dr. Jeannie Fend.  He reviewed images in chart and discussed physical exam today.  Wound is extensively irrigated and scrubbed with betadine, small flecks of metal and as much dirt was removed as possible.  Wound is loosely closed.  Patient also given a dose of IV Ancef.  He is instructed to follow closely with Dr. Jeannie Fend on Tuesday for close follow-up.  Discussed reasons to return to the ED sooner.  Patient is agreeable with this plan, safe for discharge.  Pt discussed with and evaluated by Dr. Gilford Raid.  Discussed results, findings, treatment and follow up. Patient advised of return precautions. Patient verbalized understanding and agreed with plan. D  Final Clinical Impression(s) / ED Diagnoses Final diagnoses:  Laceration of left wrist, initial encounter    Rx / DC Orders ED Discharge Orders         Ordered    cephALEXin (KEFLEX) 500 MG capsule  3 times daily     11/13/19 1139           Westyn Driggers, [place] N, PA-C 11/13/19 1149    Isla Pence,  MD  11/13/19 1246  

## 2019-11-13 NOTE — ED Notes (Signed)
PA at bedside irrigating wound 

## 2021-04-30 IMAGING — DX DG WRIST COMPLETE 3+V*L*
4 series · 5 of 5 positions shown · non-contrast
Comparison: None.

CLINICAL DATA: Laceration to MEDIAL LEFT wrist.

EXAM:
LEFT WRIST - COMPLETE 3+ VIEW

[wrist ap (1 of 2)]
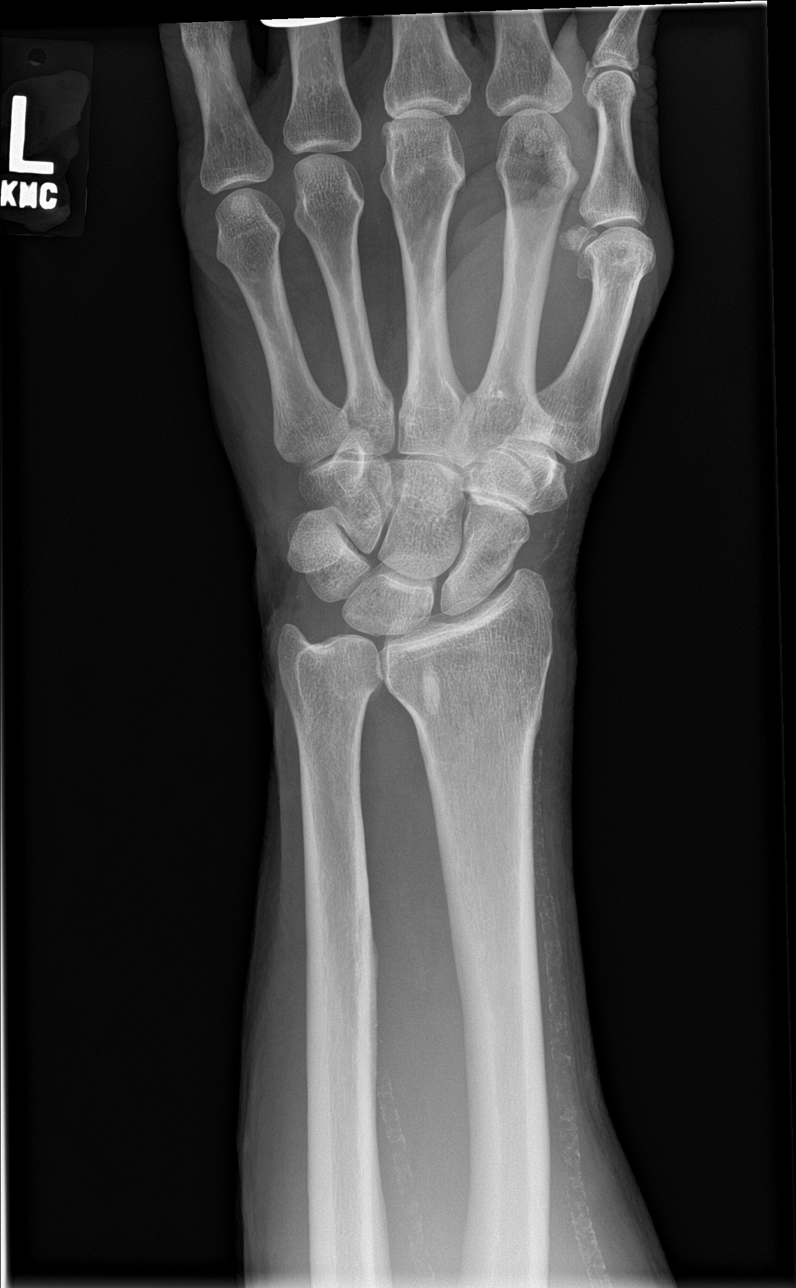

[wrist obl]
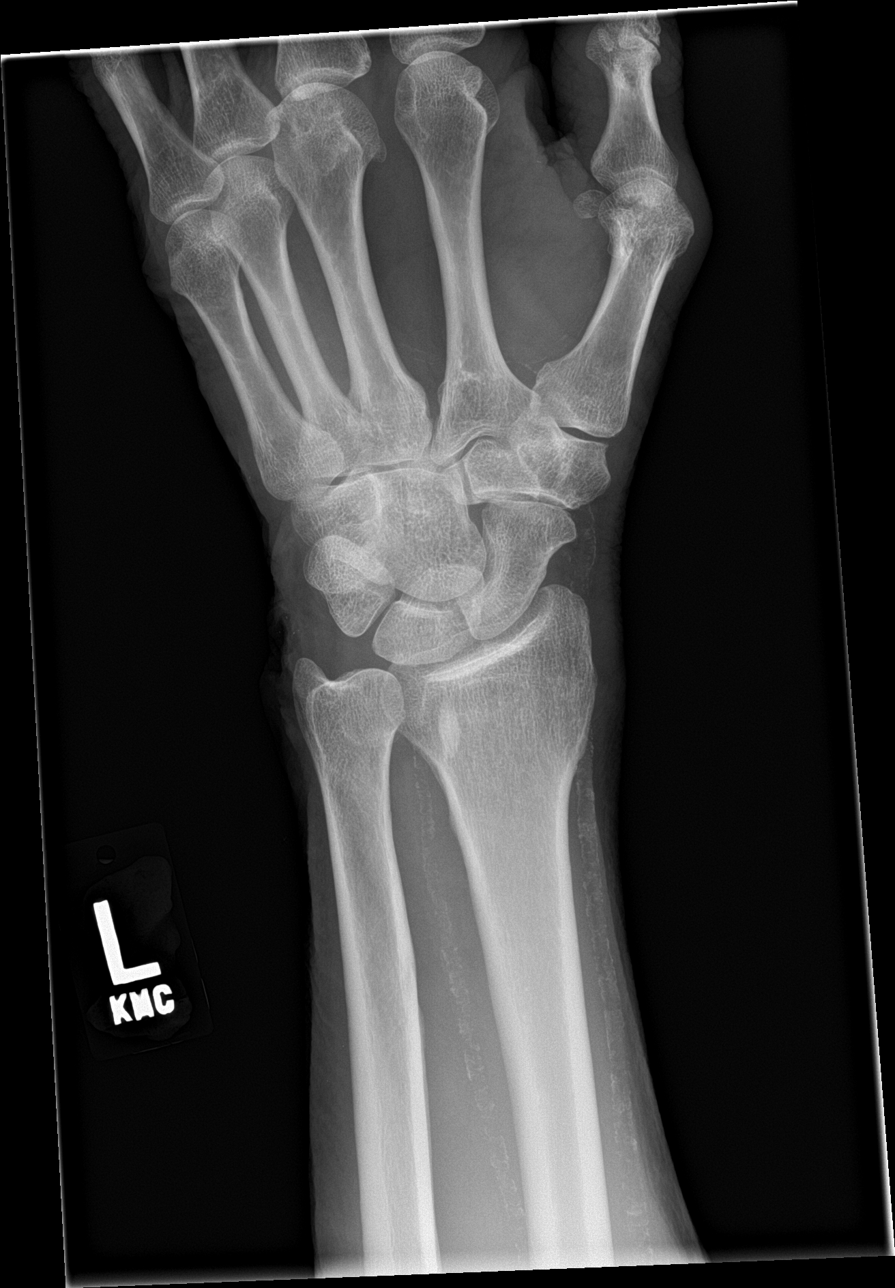

[Series 3: wrist tunnel · 0.14mm/px · 2 of 2 slices shown]
[im 1/2]
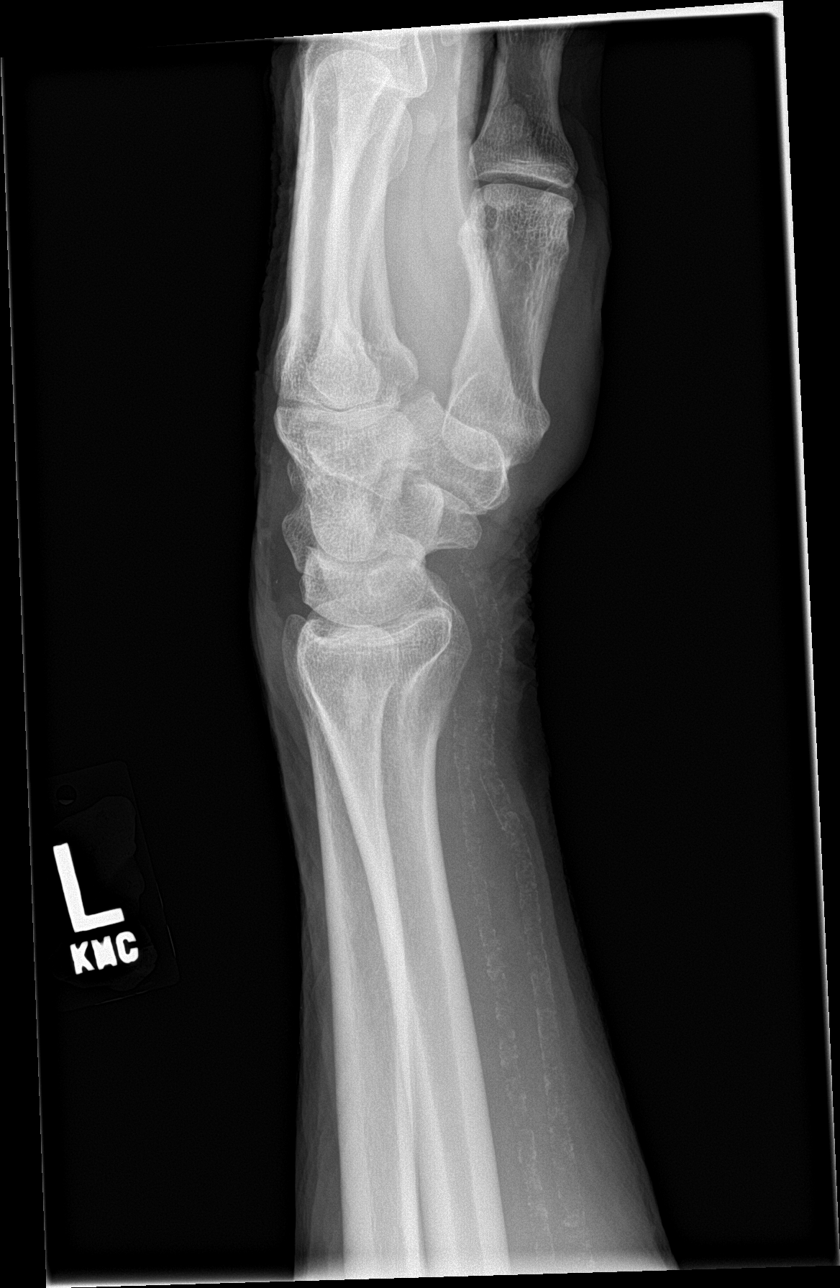
[im 2/2]
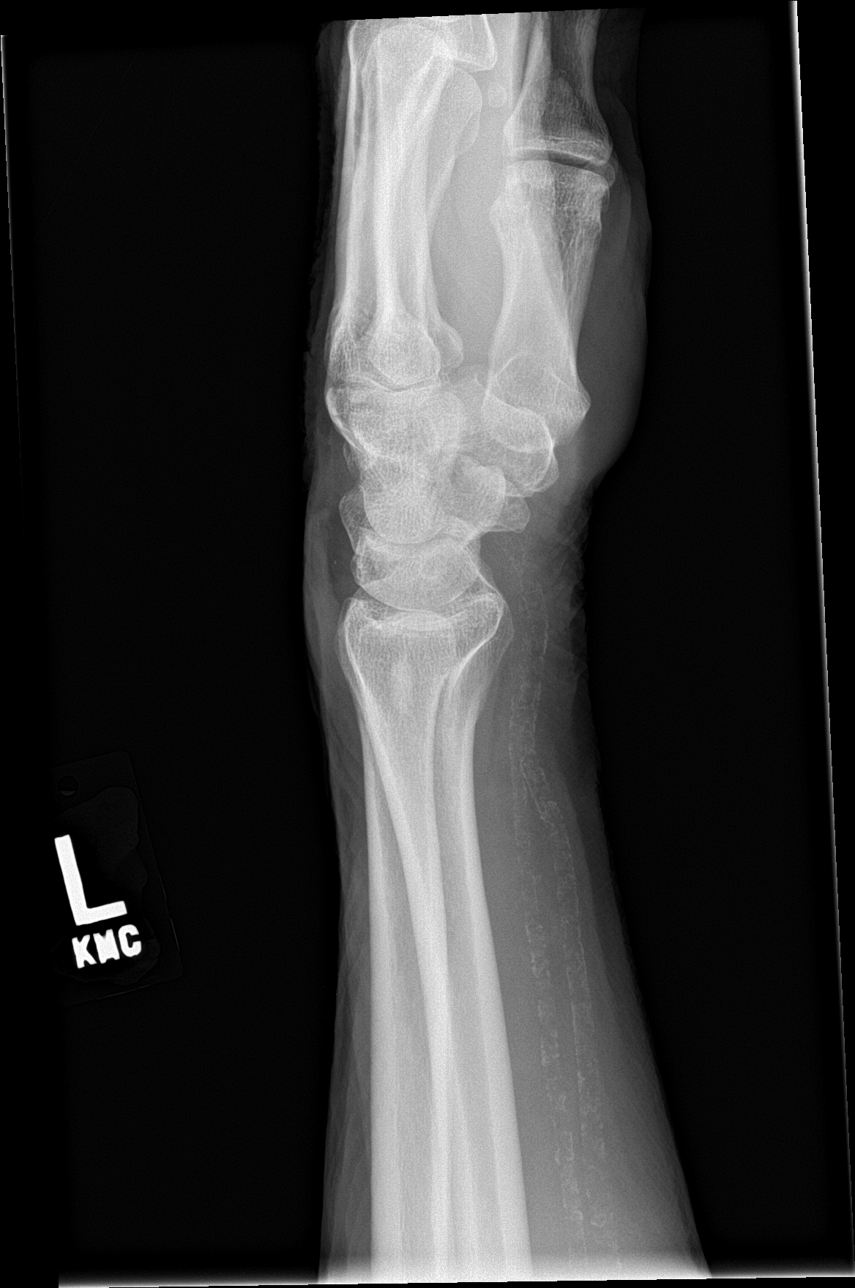

[wrist ap (2 of 2)]
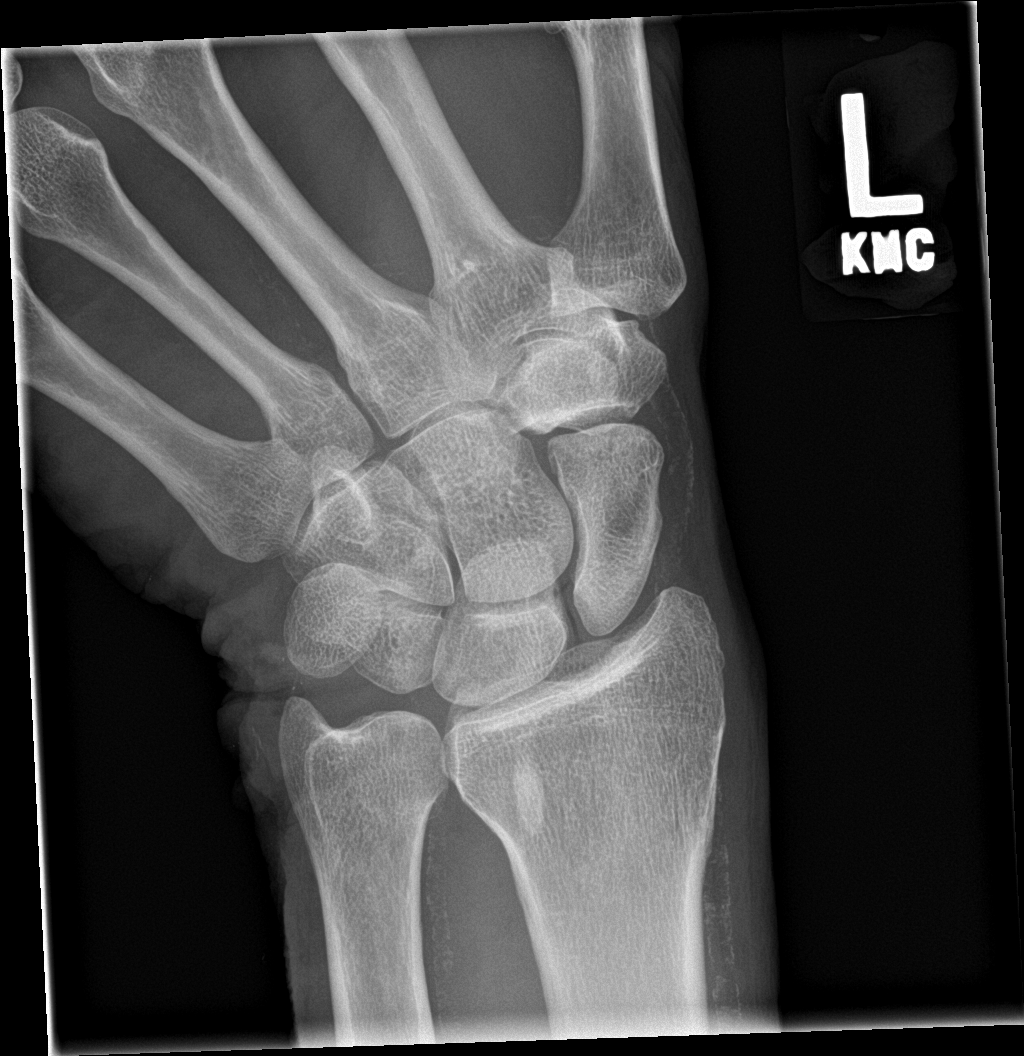

[5 of 5 positions shown; findings below may reference images not displayed]

FINDINGS: No acute fracture, subluxation or dislocation.

Soft tissue defect along the MEDIAL wrist is noted compatible with
known laceration. At least 4 tiny hyperdense structures along the
laceration site noted.

No focal bony lesions are present.

Vascular calcifications are noted.
IMPRESSION: 1. Soft tissue laceration along the MEDIAL wrist with at least 4
tiny hyperdense structures along the laceration site. Correlate
clinically.
2. No acute bony abnormality.

## 2021-10-23 DIAGNOSIS — Z85828 Personal history of other malignant neoplasm of skin: Secondary | ICD-10-CM | POA: Diagnosis not present

## 2021-10-23 DIAGNOSIS — D225 Melanocytic nevi of trunk: Secondary | ICD-10-CM | POA: Diagnosis not present

## 2021-10-23 DIAGNOSIS — L723 Sebaceous cyst: Secondary | ICD-10-CM | POA: Diagnosis not present

## 2021-10-23 DIAGNOSIS — L570 Actinic keratosis: Secondary | ICD-10-CM | POA: Diagnosis not present

## 2022-01-15 DIAGNOSIS — H2513 Age-related nuclear cataract, bilateral: Secondary | ICD-10-CM | POA: Diagnosis not present

## 2022-01-15 DIAGNOSIS — H5203 Hypermetropia, bilateral: Secondary | ICD-10-CM | POA: Diagnosis not present

## 2022-01-15 DIAGNOSIS — H40033 Anatomical narrow angle, bilateral: Secondary | ICD-10-CM | POA: Diagnosis not present

## 2022-03-06 DIAGNOSIS — Z0000 Encounter for general adult medical examination without abnormal findings: Secondary | ICD-10-CM | POA: Diagnosis not present

## 2022-03-06 DIAGNOSIS — Z125 Encounter for screening for malignant neoplasm of prostate: Secondary | ICD-10-CM | POA: Diagnosis not present

## 2022-03-06 DIAGNOSIS — R7989 Other specified abnormal findings of blood chemistry: Secondary | ICD-10-CM | POA: Diagnosis not present

## 2022-03-06 DIAGNOSIS — E538 Deficiency of other specified B group vitamins: Secondary | ICD-10-CM | POA: Diagnosis not present

## 2022-03-13 DIAGNOSIS — Z0000 Encounter for general adult medical examination without abnormal findings: Secondary | ICD-10-CM | POA: Diagnosis not present

## 2022-03-13 DIAGNOSIS — Z1339 Encounter for screening examination for other mental health and behavioral disorders: Secondary | ICD-10-CM | POA: Diagnosis not present

## 2022-03-13 DIAGNOSIS — E538 Deficiency of other specified B group vitamins: Secondary | ICD-10-CM | POA: Diagnosis not present

## 2022-03-13 DIAGNOSIS — Z1331 Encounter for screening for depression: Secondary | ICD-10-CM | POA: Diagnosis not present

## 2022-03-29 ENCOUNTER — Encounter: Payer: Self-pay | Admitting: Internal Medicine

## 2022-04-22 DIAGNOSIS — R051 Acute cough: Secondary | ICD-10-CM | POA: Diagnosis not present

## 2022-05-01 ENCOUNTER — Encounter: Payer: Self-pay | Admitting: Internal Medicine

## 2022-05-01 ENCOUNTER — Ambulatory Visit: Payer: BC Managed Care – PPO | Admitting: Internal Medicine

## 2022-05-01 VITALS — BP 118/70 | HR 52 | Ht 73.0 in | Wt 177.6 lb

## 2022-05-01 DIAGNOSIS — K625 Hemorrhage of anus and rectum: Secondary | ICD-10-CM | POA: Diagnosis not present

## 2022-05-01 DIAGNOSIS — Z86010 Personal history of colonic polyps: Secondary | ICD-10-CM

## 2022-05-01 MED ORDER — PLENVU 140 G PO SOLR: 1.0000 | Freq: Once | ORAL | 0 refills | Status: AC

## 2022-05-01 NOTE — Patient Instructions (Signed)
If you are age 66 or older, your body mass index should be between 23-30. Your Body mass index is 23.43 kg/m. If this is out of the aforementioned range listed, please consider follow up with your Primary Care Provider.  If you are age 49 or younger, your body mass index should be between 19-25. Your Body mass index is 23.43 kg/m. If this is out of the aformentioned range listed, please consider follow up with your Primary Care Provider.   ________________________________________________________  The [place] [facility] providers would like to encourage you to use [facility] to communicate with providers for non-urgent requests or questions.  Due to long hold times on the telephone, sending your provider a message by [facility] may be a faster and more efficient way to get a response.  Please allow 48 business hours for a response.  Please remember that this is for non-urgent requests.  _______________________________________________________  Bradley Dominguez have been scheduled for a colonoscopy. Please follow written instructions given to you at your visit today.  Please pick up your prep supplies at the pharmacy within the next 1-3 days. If you use inhalers (even only as needed), please bring them with you on the day of your procedure.

## 2022-05-01 NOTE — Progress Notes (Addendum)
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  Does have a history of tubular adenoma in 2009 on colonoscopy.  Follow-up examination was 2016.  This was normal.  Patient has been extremely DIFFICULT colonoscopy due to anatomical change from prior extensive abdominal surgery.  At the time of his last procedure was determined that for routine follow-up in 10 years he undergo a virtual colonoscopy.  He presents today telling me that in July he experienced bright red blood per rectum with defecation on 12 consecutive days.  There were no other significant symptoms such as abdominal pain, rectal pain, or change in bowel habits from his baseline.  His weight has been stable.  He tells me that he did have a checkup with his PCP in recent months.  We have requested his most recent CBC.  He did notice that the blood was admixed with his bowel movement as well as in the toilet bowl.  He is aware of the technical difficulties related to his prior colonoscopies.  He is noted to have internal hemorrhoids on prior colonoscopy.  REVIEW OF SYSTEMS:  All non-GI ROS negative unless otherwise stated in the HPI except for arthritis  Past Medical History:  Diagnosis Date   Arthritis    HANDS   Bowel obstruction (HCC)    CHF (congestive heart failure) (HCC) 11/26/2018   Elevated brain natriuretic peptide (BNP) level 11/25/2018   Family history of adverse reaction to anesthesia    FATHER SLOW TO AWAKEN, MOTHER POST OP N/V   Frequent UTI    History of colon polyps 2009   History of shingles    Plantar fasciitis    Prostate hypertrophy    Vitamin B 12 deficiency     Past Surgical History:  Procedure Laterality Date   APPENDECTOMY     CHOLECYSTECTOMY     COLONSCOPY  AGE 64   WITH POLYP REMOVED   SMALL INTESTINE SURGERY     1979   TRANSURETHRAL RESECTION OF PROSTATE N/A  09/22/2017   Procedure: TRANSURETHRAL RESECTION OF THE PROSTATE (TURP);  Surgeon: Malen Gauze, MD;  Location: [facility];  Service: Urology;  Laterality: N/A;    Social History Bradley Dominguez  reports that he has never smoked. He has never used smokeless tobacco. He reports that he does not drink alcohol and does not use drugs.  family history includes Breast cancer in his mother; Diabetes in an other family member; Heart attack in his father.  No Known Allergies     PHYSICAL EXAMINATION:Vital signs: BP 118/70   Pulse (!) 52   Ht 6\' 1"  (1.854 m)   Wt 177 lb 9.6 oz (80.6 kg)   SpO2 100%   BMI 23.43 kg/m   Constitutional: generally well-appearing, no acute distress Psychiatric: alert and oriented x3, cooperative Eyes: extraocular movements intact, anicteric, conjunctiva pink Mouth: oral pharynx moist, no lesions Neck: supple no lymphadenopathy Cardiovascular: heart regular rate and rhythm, no murmur Lungs: clear to auscultation bilaterally Abdomen: soft, nontender, nondistended, no obvious ascites, no peritoneal signs, normal bowel sounds, no organomegaly Rectal: Deferred to colonoscopy Extremities: no clubbing, cyanosis, or lower extremity edema bilaterally Skin: no lesions on visible extremities Neuro: No focal deficits.  Cranial nerves intact   ASSESSMENT:  1.  Recent problems with persistent painless rectal bleeding.  Possibilities include benign disease such as hemorrhoids.  Without neoplasia. 2.  History of diminutive adenoma in 2009 3.  Technically difficult colonoscopies previously due to anatomic change from prior extensive surgery   PLAN:  1.  Consult CBC requested 2.  We will plan on colonoscopy with ultrathin pediatric colonoscope.  Based on his history, the most important portion of his colon to examine would be the distal half.  Thus, if the exam is incomplete, it should be adequate.  We discussed this scenario.  He understands.  These  are the baseline risk due to his anatomic abnormalities.The nature of the procedure, as well as the risks, benefits, and alternatives were carefully and thoroughly reviewed with the patient. Ample time for discussion and questions allowed. The patient understood, was satisfied, and agreed to proceed.  3.  Further recommendations after the above  ADDENDUM (May 06, 2022): Outside labs received.  Hemoglobin from Mar 06, 2022 was normal at 13.6

## 2022-05-07 ENCOUNTER — Telehealth: Payer: Self-pay | Admitting: Internal Medicine

## 2022-05-07 MED ORDER — PLENVU 140 G PO SOLR: 1.0000 | Freq: Once | ORAL | 0 refills | Status: AC

## 2022-05-07 NOTE — Telephone Encounter (Signed)
Plenvu sent to pharmacy 

## 2022-05-07 NOTE — Telephone Encounter (Signed)
Patient wife called, stated the could not pick up Plenvu prep medication. Order is not in yet.

## 2022-05-14 ENCOUNTER — Encounter: Payer: Self-pay | Admitting: Internal Medicine

## 2022-05-21 ENCOUNTER — Encounter: Payer: Self-pay | Admitting: Internal Medicine

## 2022-05-21 ENCOUNTER — Ambulatory Visit (AMBULATORY_SURGERY_CENTER): Payer: BC Managed Care – PPO | Admitting: Internal Medicine

## 2022-05-21 VITALS — BP 95/65 | HR 51 | Temp 97.3°F | Resp 9 | Ht 73.0 in | Wt 177.0 lb

## 2022-05-21 DIAGNOSIS — K648 Other hemorrhoids: Secondary | ICD-10-CM | POA: Diagnosis not present

## 2022-05-21 DIAGNOSIS — K625 Hemorrhage of anus and rectum: Secondary | ICD-10-CM

## 2022-05-21 DIAGNOSIS — Z86010 Personal history of colonic polyps: Secondary | ICD-10-CM

## 2022-05-21 MED ORDER — SODIUM CHLORIDE 0.9 % IV SOLN: 500.0000 mL | Freq: Once | INTRAVENOUS | Status: DC

## 2022-05-21 NOTE — Progress Notes (Signed)
PT taken to PACU. Monitors in place. VSS. Report given to RN. 

## 2022-05-21 NOTE — Progress Notes (Signed)
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  Does have a history of tubular adenoma in 2009 on colonoscopy.  Follow-up examination was 2016.  This was normal.  Patient has been extremely DIFFICULT colonoscopy due to anatomical change from prior extensive abdominal surgery.  At the time of his last procedure was determined that for routine follow-up in 10 years he undergo a virtual colonoscopy.  He presents today telling me that in July he experienced bright red blood per rectum with defecation on 12 consecutive days.  There were no other significant symptoms such as abdominal pain, rectal pain, or change in bowel habits from his baseline.  His weight has been stable.  He tells me that he did have a checkup with his PCP in recent months.  We have requested his most recent CBC.  He did notice that the blood was admixed with his bowel movement as well as in the toilet bowl.  He is aware of the technical difficulties related to his prior colonoscopies.  He is noted to have internal hemorrhoids on prior colonoscopy.   REVIEW OF SYSTEMS:   All non-GI ROS negative unless otherwise stated in the HPI except for arthritis       Past Medical History:  Diagnosis Date   Arthritis      HANDS   Bowel obstruction (HCC)     CHF (congestive heart failure) (HCC) 11/26/2018   Elevated brain natriuretic peptide (BNP) level 11/25/2018   Family history of adverse reaction to anesthesia      FATHER SLOW TO AWAKEN, MOTHER POST OP N/V   Frequent UTI     History of colon polyps 2009   History of shingles     Plantar fasciitis     Prostate hypertrophy     Vitamin B 12 deficiency             Past Surgical History:  Procedure Laterality Date   APPENDECTOMY       CHOLECYSTECTOMY       COLONSCOPY   AGE 42    WITH POLYP REMOVED   SMALL INTESTINE SURGERY        1979    TRANSURETHRAL RESECTION OF PROSTATE N/A 09/22/2017    Procedure: TRANSURETHRAL RESECTION OF THE PROSTATE (TURP);  Surgeon: Malen Gauze, MD;  Location[facility]  Service: Urology;  Laterality: N/A;      Social History Khaleed Holan  reports that he has never smoked. He has never used smokeless tobacco. He reports that he does not drink alcohol and does not use drugs.   family history includes Breast cancer in his mother; Diabetes in an other family member; Heart attack in his father.   No Known Allergies       PHYSICAL EXAMINATION:Vital signs: BP 118/70   Pulse (!) 52   Ht 6\' 1"  (1.854 m)   Wt 177 lb 9.6 oz (80.6 kg)   SpO2 100%   BMI 23.43 kg/m   Constitutional: generally well-appearing, no acute distress Psychiatric: alert and oriented x3, cooperative Eyes: extraocular movements intact, anicteric, conjunctiva pink Mouth: oral pharynx moist, no lesions Neck: supple no lymphadenopathy Cardiovascular: heart regular rate and rhythm, no murmur Lungs: clear to auscultation bilaterally Abdomen: soft, nontender, nondistended, no obvious ascites, no peritoneal signs, normal bowel sounds, no organomegaly Rectal: Deferred to colonoscopy Extremities: no clubbing, cyanosis, or lower extremity edema  bilaterally Skin: no lesions on visible extremities Neuro: No focal deficits.  Cranial nerves intact     ASSESSMENT:   1.  Recent problems with persistent painless rectal bleeding.  Possibilities include benign disease such as hemorrhoids.  Without neoplasia. 2.  History of diminutive adenoma in 2009 3.  Technically difficult colonoscopies previously due to anatomic change from prior extensive surgery     PLAN:   1.  Consult CBC requested 2.  We will plan on colonoscopy with ultrathin pediatric colonoscope.  Based on his history, the most important portion of his colon to examine would be the distal half.  Thus, if the exam is incomplete, it should be adequate.   We discussed this scenario.  He understands.  These are the baseline risk due to his anatomic abnormalities.The nature of the procedure, as well as the risks, benefits, and alternatives were carefully and thoroughly reviewed with the patient. Ample time for discussion and questions allowed. The patient understood, was satisfied, and agreed to proceed.  3.  Further recommendations after the above   ADDENDUM (May 06, 2022): Outside labs received.  Hemoglobin from Mar 06, 2022 was normal at 13.6

## 2022-05-21 NOTE — Op Note (Signed)
[facility] Patient Name: Bradley Dominguez Procedure Date: 05/21/2022 11:59 AM MRN: [id number] Endoscopist: Wilhemina Bonito. Marina Goodell , MD Age: 66 Referring MD:  Date of Birth: Jun 30, 1956 Gender: Male Account #: [id number] Procedure:                Colonoscopy Indications:              Rectal bleeding. Normal hemoglobin. Previous                            examinations 2009 (diminutive adenoma) in 2016                            (negative for neoplasia). Technically difficult                            exams Medicines:                Monitored Anesthesia Care Procedure:                Pre-Anesthesia Assessment:                           - Prior to the procedure, a History and Physical                            was performed, and patient medications and                            allergies were reviewed. The patient's tolerance of                            previous anesthesia was also reviewed. The risks                            and benefits of the procedure and the sedation                            options and risks were discussed with the patient.                            All questions were answered, and informed consent                            was obtained. Prior Anticoagulants: The patient has                            taken no previous anticoagulant or antiplatelet                            agents. ASA Grade Assessment: II - A patient with                            mild systemic disease. After reviewing the risks  and benefits, the patient was deemed in                            satisfactory condition to undergo the procedure.                           After obtaining informed consent, the colonoscope                            was passed under direct vision. Throughout the                            procedure, the patient's blood pressure, pulse, and                            oxygen saturations were monitored continuously. The                             0405 PCF-H190TL Slim SB Colonoscope was introduced                            through the anus and advanced to the the right                            colon. The extent of the exam and, and rectum were                            photographed. The quality of the bowel preparation                            was adequate to identify polyps 6 mm and larger in                            size. The colonoscopy was performed without                            difficulty. The patient tolerated the procedure                            well. The bowel preparation used was Prepopik via                            split dose instruction. Scope In: 12:16:35 PM Scope Out: 12:34:58 PM Scope Withdrawal Time: 0 hours 7 minutes 15 seconds  Total Procedure Duration: 0 hours 18 minutes 23 seconds  Findings:                 Internal hemorrhoids were found during                            retroflexion. The hemorrhoids were moderate with                            some friability. The rectal veins were prominent.  The exam was technically difficult as previous.                            Colonoscope was advanced to the ascending colon.                            Exam was otherwise without abnormality on direct                            and retroflexion views, to the ascending colon. Complications:            No immediate complications. Estimated blood loss:                            None. Estimated Blood Loss:     Estimated blood loss: none. Impression:               - Internal hemorrhoids. This is the cause for                            recent rectal bleeding.                           - The examination was otherwise normal on direct                            and retroflexion views, to the ascending colon.                           -Technically difficult exam. Recommendation:           - Repeat routine colonoscopy is not recommended.                           - Patient  has a contact number available for                            emergencies. The signs and symptoms of potential                            delayed complications were discussed with the                            patient. Return to normal activities tomorrow.                            Written discharge instructions were provided to the                            patient.                           - Resume previous diet.                           - Continue present medications. Wilhemina Bonito. Marina Goodell, MD 05/21/2022 12:42:55 PM This report  has been signed electronically.

## 2022-05-21 NOTE — Progress Notes (Signed)
Pt's states no medical or surgical changes since previsit or office visit. 

## 2022-05-21 NOTE — Patient Instructions (Signed)
YOU HAD AN ENDOSCOPIC PROCEDURE TODAY AT THE [facility]:   Refer to the procedure report that was given to you for any specific questions about what was found during the examination.  If the procedure report does not answer your questions, please call your gastroenterologist to clarify.  If you requested that your care partner not be given the details of your procedure findings, then the procedure report has been included in a sealed envelope for you to review at your convenience later.  YOU SHOULD EXPECT: Some feelings of bloating in the abdomen. Passage of more gas than usual.  Walking can help get rid of the air that was put into your GI tract during the procedure and reduce the bloating. If you had a lower endoscopy (such as a colonoscopy or flexible sigmoidoscopy) you may notice spotting of blood in your stool or on the toilet paper. If you underwent a bowel prep for your procedure, you may not have a normal bowel movement for a few days.  Please Note:  You might notice some irritation and congestion in your nose or some drainage.  This is from the oxygen used during your procedure.  There is no need for concern and it should clear up in a day or so.  SYMPTOMS TO REPORT IMMEDIATELY:  Following lower endoscopy (colonoscopy or flexible sigmoidoscopy):  Excessive amounts of blood in the stool  Significant tenderness or worsening of abdominal pains  Swelling of the abdomen that is new, acute  Fever of 100F or higher  For urgent or emergent issues, a gastroenterologist can be reached at any hour by calling [phone]. Do not use MyChart messaging for urgent concerns.    DIET:  We do recommend a small meal at first, but then you may proceed to your regular diet.  Drink plenty of fluids but you should avoid alcoholic beverages for 24 hours.  ACTIVITY:  You should plan to take it easy for the rest of today and you should NOT DRIVE or use heavy machinery until tomorrow (because of  the sedation medicines used during the test).    FOLLOW UP: Our staff will call the number listed on your records the next business day following your procedure.  We will call around 7:15- 8:00 am to check on you and address any questions or concerns that you may have regarding the information given to you following your procedure. If we do not reach you, we will leave a message.  If you develop any symptoms (ie: fever, flu-like symptoms, shortness of breath, cough etc.) before then, please call [phone].  If you test positive for Covid 19 in the 2 weeks post procedure, please call and report this information to us.    If any biopsies were taken you will be contacted by phone or by letter within the next 1-3 weeks.  Please call us at [phone] if you have not heard about the biopsies in 3 weeks.    SIGNATURES/CONFIDENTIALITY: You and/or your care partner have signed paperwork which will be entered into your electronic medical record.  These signatures attest to the fact that that the information above on your After Visit Summary has been reviewed and is understood.  Full responsibility of the confidentiality of this discharge information lies with you and/or your care-partner.  

## 2022-05-22 ENCOUNTER — Telehealth: Payer: Self-pay

## 2022-05-22 NOTE — Telephone Encounter (Signed)
  Follow up Call-     05/21/2022   11:03 AM  Call back number  Post procedure Call Back phone  # [phone]  Permission to leave phone message Yes     Patient questions:  Do you have a fever, pain , or abdominal swelling? No. Pain Score  0 *  Have you tolerated food without any problems? Yes.    Have you been able to return to your normal activities? Yes.    Do you have any questions about your discharge instructions: Diet   No. Medications  No. Follow up visit  No.  Do you have questions or concerns about your Care? No.  Actions: * If pain score is 4 or above: No action needed, pain <4.

## 2022-05-25 DIAGNOSIS — S0921XA Traumatic rupture of right ear drum, initial encounter: Secondary | ICD-10-CM | POA: Diagnosis not present

## 2022-05-27 DIAGNOSIS — H7291 Unspecified perforation of tympanic membrane, right ear: Secondary | ICD-10-CM | POA: Diagnosis not present

## 2022-07-02 DIAGNOSIS — H90A31 Mixed conductive and sensorineural hearing loss, unilateral, right ear with restricted hearing on the contralateral side: Secondary | ICD-10-CM | POA: Diagnosis not present

## 2022-07-02 DIAGNOSIS — H7291 Unspecified perforation of tympanic membrane, right ear: Secondary | ICD-10-CM | POA: Diagnosis not present

## 2022-08-20 DIAGNOSIS — R319 Hematuria, unspecified: Secondary | ICD-10-CM | POA: Diagnosis not present

## 2022-08-20 DIAGNOSIS — N390 Urinary tract infection, site not specified: Secondary | ICD-10-CM | POA: Diagnosis not present

## 2022-09-11 DIAGNOSIS — K567 Ileus, unspecified: Secondary | ICD-10-CM | POA: Diagnosis not present

## 2022-09-11 DIAGNOSIS — R112 Nausea with vomiting, unspecified: Secondary | ICD-10-CM | POA: Diagnosis not present

## 2022-09-13 ENCOUNTER — Other Ambulatory Visit: Payer: Self-pay | Admitting: Internal Medicine

## 2022-09-13 DIAGNOSIS — R112 Nausea with vomiting, unspecified: Secondary | ICD-10-CM

## 2022-09-16 ENCOUNTER — Ambulatory Visit
Admission: RE | Admit: 2022-09-16 | Discharge: 2022-09-16 | Disposition: A | Discharge status: Home / Self Care | Payer: BC Managed Care – PPO | Source: Ambulatory Visit | Attending: Internal Medicine | Admitting: Internal Medicine

## 2022-09-16 DIAGNOSIS — R112 Nausea with vomiting, unspecified: Secondary | ICD-10-CM

## 2022-09-16 DIAGNOSIS — K6389 Other specified diseases of intestine: Secondary | ICD-10-CM | POA: Diagnosis not present

## 2022-09-16 MED ORDER — IOPAMIDOL (ISOVUE-300) INJECTION 61%
100.0000 mL | Freq: Once | INTRAVENOUS | Status: AC | PRN
  Administered 2022-09-16: 100 mL via INTRAVENOUS

## 2022-09-20 DIAGNOSIS — Z9889 Other specified postprocedural states: Secondary | ICD-10-CM | POA: Diagnosis not present

## 2022-09-20 DIAGNOSIS — K9289 Other specified diseases of the digestive system: Secondary | ICD-10-CM | POA: Diagnosis not present

## 2022-11-04 DIAGNOSIS — Z85828 Personal history of other malignant neoplasm of skin: Secondary | ICD-10-CM | POA: Diagnosis not present

## 2022-11-04 DIAGNOSIS — D485 Neoplasm of uncertain behavior of skin: Secondary | ICD-10-CM | POA: Diagnosis not present

## 2022-11-04 DIAGNOSIS — L570 Actinic keratosis: Secondary | ICD-10-CM | POA: Diagnosis not present

## 2022-11-04 DIAGNOSIS — D170 Benign lipomatous neoplasm of skin and subcutaneous tissue of head, face and neck: Secondary | ICD-10-CM | POA: Diagnosis not present

## 2022-11-04 DIAGNOSIS — L821 Other seborrheic keratosis: Secondary | ICD-10-CM | POA: Diagnosis not present

## 2022-11-04 DIAGNOSIS — D2272 Melanocytic nevi of left lower limb, including hip: Secondary | ICD-10-CM | POA: Diagnosis not present

## 2022-12-09 DIAGNOSIS — R112 Nausea with vomiting, unspecified: Secondary | ICD-10-CM | POA: Diagnosis not present

## 2022-12-09 DIAGNOSIS — R634 Abnormal weight loss: Secondary | ICD-10-CM | POA: Diagnosis not present

## 2022-12-09 DIAGNOSIS — R140 Abdominal distension (gaseous): Secondary | ICD-10-CM | POA: Diagnosis not present

## 2022-12-09 DIAGNOSIS — R197 Diarrhea, unspecified: Secondary | ICD-10-CM | POA: Diagnosis not present

## 2022-12-10 DIAGNOSIS — R634 Abnormal weight loss: Secondary | ICD-10-CM | POA: Diagnosis not present

## 2022-12-10 DIAGNOSIS — K219 Gastro-esophageal reflux disease without esophagitis: Secondary | ICD-10-CM | POA: Diagnosis not present

## 2022-12-10 DIAGNOSIS — R112 Nausea with vomiting, unspecified: Secondary | ICD-10-CM | POA: Diagnosis not present

## 2022-12-10 DIAGNOSIS — R140 Abdominal distension (gaseous): Secondary | ICD-10-CM | POA: Diagnosis not present

## 2022-12-17 ENCOUNTER — Telehealth: Payer: Self-pay | Admitting: Internal Medicine

## 2022-12-17 NOTE — Telephone Encounter (Signed)
Inbound call from patients wife stating that patient has been vomitting and has lost 20 pounds, an has also been having bloating and diarrhea. Patients wife is requesting a call back to discuss with the nurse and to see if he can be seen sooner than April 22nd with the PA. Please advise.

## 2022-12-17 NOTE — Telephone Encounter (Signed)
I have spoken to the pt wife and she describes the pt has had vomiting, bloating and nausea for the past few weeks.  He was seen by [facility] PCP on 12/09/22 (see plan below)   Plan  Reassurance. Advised pt that I suspect sx's may be at least partially related to reflux, but I also think that further investigation is needed. Will check CBC, CMP, TSH, amylase, lipase and h pylori breath test as well as GI profile stool testing. GERD precautions discussed. Increase fluids. Trial pantoprazole.  Will give zofran to use prn as well. Ok to use famotidine prn as well. If labs all return and are normal and sx's continue, advised that we will likely need to consult with GI again.   STOOL TESTS  [facility]03/02/2023 Component 12/10/2022     Campylobacter Not Detected  C. difficile Toxin A/B Not Detected  Plesiomonas shigelloides Not Detected  Salmonella Not Detected  Vibrio Not Detected  Vibrio Cholerae Not Detected  Yersinia Enterocolitica Not Detected  Enteroaggregative E coli Not Detected  Enteropathogenic E coli Not Detected  Enterotoxigenic E coli Not Detected  Shiga-toxin-producing E coli Not Detected  E COLI [id number] Not applicable  Shigella/Enteroinvasive E coli Not Detected  Cyclospora Cayetanensis Not Detected  Entamoeba Histolytica Not Detected  Giardia Lamblia Not Detected  Adenovirus F 40/41 Not Detected  Astrovirus Not Detected  Norovirus GI/GII Not Detected  Rotavirus A Not Detected  Sapovirus Not Detected    I have scheduled the pt to see Dr Henrene Pastor on 4/3 and advised that the pt should also call her PCP and make them aware we have him scheduled for first available and will also call if there is a cancellation.    After the phone conversation I found a spot with Ellouise Newer PA for 3/22 and pt was rescheduled to see her. The pt and wife have been advised.

## 2022-12-27 ENCOUNTER — Ambulatory Visit (INDEPENDENT_AMBULATORY_CARE_PROVIDER_SITE_OTHER)
Admission: RE | Admit: 2022-12-27 | Discharge: 2022-12-27 | Disposition: A | Discharge status: Home / Self Care | Payer: BC Managed Care – PPO | Source: Ambulatory Visit | Attending: Physician Assistant | Admitting: Physician Assistant

## 2022-12-27 ENCOUNTER — Encounter: Payer: Self-pay | Admitting: Physician Assistant

## 2022-12-27 ENCOUNTER — Ambulatory Visit: Payer: BC Managed Care – PPO | Admitting: Physician Assistant

## 2022-12-27 VITALS — BP 110/62 | HR 67 | Ht 73.0 in | Wt 161.0 lb

## 2022-12-27 DIAGNOSIS — Z8719 Personal history of other diseases of the digestive system: Secondary | ICD-10-CM

## 2022-12-27 DIAGNOSIS — K219 Gastro-esophageal reflux disease without esophagitis: Secondary | ICD-10-CM | POA: Diagnosis not present

## 2022-12-27 DIAGNOSIS — R112 Nausea with vomiting, unspecified: Secondary | ICD-10-CM

## 2022-12-27 DIAGNOSIS — R140 Abdominal distension (gaseous): Secondary | ICD-10-CM | POA: Diagnosis not present

## 2022-12-27 NOTE — Patient Instructions (Addendum)
Your provider has requested that you have an abdominal x ray before leaving today. Please go to the basement floor to our Radiology department for the test.   _______________________________________________________  If your blood pressure at your visit was 140/90 or greater, please contact your primary care physician to follow up on this.  _______________________________________________________  If you are age 67 or older, your body mass index should be between 23-30. Your Body mass index is 21.24 kg/m. If this is out of the aforementioned range listed, please consider follow up with your Primary Care Provider.  If you are age 22 or younger, your body mass index should be between 19-25. Your Body mass index is 21.24 kg/m. If this is out of the aformentioned range listed, please consider follow up with your Primary Care Provider.   ________________________________________________________  The [facility] providers would like to encourage you to use [facility] to communicate with providers for non-urgent requests or questions.  Due to long hold times on the telephone, sending your provider a message by [facility] may be a faster and more efficient way to get a response.  Please allow 48 business hours for a response.  Please remember that this is for non-urgent requests.  _______________________________________________________  Due to recent changes in healthcare laws, you may see the results of your imaging and laboratory studies on MyChart before your provider has had a chance to review them.  We understand that in some cases there may be results that are confusing or concerning to you. Not all laboratory results come back in the same time frame and the provider may be waiting for multiple results in order to interpret others.  Please give [place] 48 hours in order for your provider to thoroughly review all the results before contacting the office for clarification of your results.

## 2022-12-27 NOTE — Progress Notes (Signed)
Discussed case in detail with Dr. Loletha Carrow today.  Recommendations to follow closely with the surgical team next week to discuss next steps.  Did explain emergency symptoms with him including the inability to pass gas or stool or constant nausea vomiting or worsening abdominal pain.  At that point recommend to go to the ER.  Would recommend liquid diet for now.  Please make him an appt ideally with Dr. Michaelle Birks or APP next week.  Thanks, JL L

## 2022-12-27 NOTE — Progress Notes (Signed)
Chief Complaint: Bloating, Nausea and Vomiting  HPI:    [name] is a  67 y/o male, known to Dr. Henrene Pastor, with a past medical history as listed below including CHF and prior bowel obstruction, who was referred to me by Haywood Pao, MD for a complaint of bloating, nausea and vomiting.      05/01/2022 office visit with Dr. Henrene Pastor for rectal bleeding.  At that time discussed that he had been extremely difficult colonoscopy due to anatomical change from prior extensive abdominal surgery.  At that time describing painless rectal bleeding.  Patient scheduled for colonoscopy with ultrathin pediatric colonoscope.    05/21/2022 colonoscopy with internal hemorrhoids, exam otherwise normal.    09/16/2022 CTAP with contrast with chronic or acute partial small bowel obstruction.  Also a masslike appearance of what may be the ileocecal valve which may be accentuated by the presence of dilute contrast and partially formed stool extending through the region, discussed possible follow-up CT enterography comparison with recent colonoscopy.    12/09/2022 patient seen by PCP and at that time was given reassurance and advised that symptoms were at least partially related to reflux.  At that time checked a CBC, CMP, TSH, amylase, lipase, H. pylori breath test and a GI profile panel.  Tried on Pantoprazole and Zofran.    12/09/2022 H. pylori breath test negative.  CBC with a hemoglobin minimally decreased at 12.7.  CMP with a BUN elevated at 34, creatinine 1.24, potassium 3.3.    12/10/2022 stool tests were negative including C. difficile and GI path panel.    12/17/2022 patient's wife called and described that the patient has been vomiting and has lost 20 pounds and also having bloating and diarrhea.    Today, the patient and his wife present to clinic and explain that he has had trouble with bloating ever since being in the hospital back in December where he was diagnosed with partial bowel obstruction.  Patient tells me  that he wakes up and as the day goes on he typically grows in abdominal distention and gurgling noises and sometimes the pressure and discomfort gets so severe that he has to just come home and lay down flat and roll around on the floor to expel some gas.  Along with this the distention sometimes presses on his stomach and causes reflux symptoms which seem to be worse later in the day.  Due to this he tried Pepcid over-the-counter at first which did not help and this has been changed to Pantoprazole 40 mg daily.  He does not think this really helps either and used it for total of 3 days in a row and actually had worse nausea and vomiting.  Does tell me when the pain and distention get so bad he starts with nausea and vomiting.  Tells me he may have a couple of days which or not as bad and then he will have a terrible day.  Sometimes even just water can make him feel worse.  Also discusses chronic diarrhea which she has had since his gallbladder was taken out years ago, 4 times a day loose watery stool.    Denies fever, chills, weight loss or blood in stool.     Past Medical History:  Diagnosis Date   Arthritis    HANDS   Bowel obstruction (HCC)    CHF (congestive heart failure) ([place]) 11/26/2018   Elevated brain natriuretic peptide (BNP) level 11/25/2018   Family history of adverse reaction to anesthesia  FATHER SLOW TO AWAKEN, MOTHER POST OP N/V   Frequent UTI    History of colon polyps 2009   History of shingles    Plantar fasciitis    Prostate hypertrophy    Vitamin B 12 deficiency     Past Surgical History:  Procedure Laterality Date   APPENDECTOMY     CHOLECYSTECTOMY     COLONSCOPY  AGE 41   WITH POLYP REMOVED   SMALL INTESTINE SURGERY     1979   TRANSURETHRAL RESECTION OF PROSTATE N/A 09/22/2017   Procedure: TRANSURETHRAL RESECTION OF THE PROSTATE (TURP);  Surgeon: Cleon Gustin, MD;  Location: [facility];  Service: Urology;  Laterality: N/A;    Current  Outpatient Medications  Medication Sig Dispense Refill   acetaminophen (TYLENOL) 500 MG tablet Take 1,000 mg by mouth every 6 (six) hours as needed.     Ascorbic Acid (VITAMIN C) 100 MG tablet Take 1,000 mg by mouth daily.     Cyanocobalamin (VITAMIN B 12 PO) Take 2,000 mg by mouth.     naproxen (NAPROSYN) 500 MG tablet Take 1 tablet (500 mg total) by mouth 2 (two) times daily. (Patient not taking: Reported on 05/01/2022) 20 tablet 0   oxyCODONE-acetaminophen (PERCOCET/ROXICET) 5-325 MG tablet Take 1-2 tablets by mouth every 4 (four) hours as needed for moderate pain. (Patient not taking: Reported on 11/26/2018) 30 tablet 0   No current facility-administered medications for this visit.    Allergies as of 12/27/2022   (No Known Allergies)    Family History  Problem Relation Age of Onset   Breast cancer Mother    Heart attack Father    Diabetes Other    Colon cancer Neg Hx    Esophageal cancer Neg Hx    Stomach cancer Neg Hx    Colon polyps Neg Hx    Rectal cancer Neg Hx     Social History   Socioeconomic History   Marital status: Married    Spouse name: Not on file   Number of children: 1   Years of education: Not on file   Highest education level: Not on file  Occupational History   Occupation: Scientist, research (life sciences)  Tobacco Use   Smoking status: Never   Smokeless tobacco: Never  Vaping Use   Vaping Use: Never used  Substance and Sexual Activity   Alcohol use: No    Alcohol/week: 0.0 standard drinks of alcohol   Drug use: No   Sexual activity: Not on file  Other Topics Concern   Not on file  Social History Narrative   Not on file   Social Determinants of Health   Financial Resource Strain: Not on file  Food Insecurity: Not on file  Transportation Needs: Not on file  Physical Activity: Not on file  Stress: Not on file  Social Connections: Not on file  Intimate Partner Violence: Not on file    Review of Systems:    Constitutional: No weight loss, fever or  chills Cardiovascular: No chest pain Respiratory: No SOB  Gastrointestinal: See HPI and otherwise negative   Physical Exam:  Vital signs: BP 110/62   Pulse 67   Ht 6\' 1"  (1.854 m)   Wt 161 lb (73 kg)   BMI 21.24 kg/m    Constitutional:   Pleasant Caucasian male appears to be in NAD, Well developed, Well nourished, alert and cooperative Respiratory: Respirations even and unlabored. Lungs clear to auscultation bilaterally.   No wheezes, crackles, or rhonchi.  Cardiovascular:  Normal S1, S2. No MRG. Regular rate and rhythm. No peripheral edema, cyanosis or pallor.  Gastrointestinal: Tense, moderate to marked distention, nontender. No rebound or guarding.  Hypertympanic bowel sounds. No appreciable masses or hepatomegaly. + Tympany on percussion in all 4 quadrants Rectal:  Not performed.  Psychiatric: Oriented to person, place and time. Demonstrates good judgement and reason without abnormal affect or behaviors.  See HPI for most recent labs and imaging.  Assessment: 1.  Bloating: Increases throughout the day, hospitalization in December for partial small bowel obstruction/small bowel obstruction, extensive history of abdominal surgeries with adhesions in the past, sometimes leads to nausea and vomiting, chronic loose stool; likely at least partial chronic small bowel obstruction and versus other possibilities such as GERD/gastritis versus SIBO versus postcholecystectomy state 2.  Nausea and vomiting: With above 3.  Extensive history of surgeries and prior bowel obstruction 4.  Chronic diarrhea: Likely from partial bowel obstruction +/- postcholecystectomy state  Plan: 1.  Initially discussed options with the patient and his wife including treatment of diarrhea with Colestipol 1 g twice daily and SIBO testing, but upon listening to the patient's abdomen my concern is greater for a small bowel or partial small bowel obstruction.  This would make sense in the setting of his multiple abdominal  surgeries and how things grow worse throughout the day and his symptoms of nausea and vomiting which likely occur when he is completely obstructed at times.  We deferred further testing and meds today and opted for a stat abdominal x-ray 2 view.  Hopefully this will allow [place] to see the extent of obstruction later in the day. 2.  Pending results from above could consider surgical consultation. 3.  Patient to follow in clinic per recommendations after above.  Ellouise Newer, PA-C [facility] 12/27/2022, 1:29 PM  Cc: Haywood Pao, MD

## 2022-12-30 NOTE — Progress Notes (Signed)
Noted. Abnormal xray. Referred back to surgery

## 2023-01-02 ENCOUNTER — Other Ambulatory Visit (HOSPITAL_COMMUNITY): Payer: Self-pay | Admitting: Surgery

## 2023-01-02 DIAGNOSIS — R140 Abdominal distension (gaseous): Secondary | ICD-10-CM | POA: Diagnosis not present

## 2023-01-08 ENCOUNTER — Ambulatory Visit: Payer: BC Managed Care – PPO | Admitting: Internal Medicine

## 2023-01-09 ENCOUNTER — Ambulatory Visit (HOSPITAL_COMMUNITY)
Admission: RE | Admit: 2023-01-09 | Discharge: 2023-01-09 | Disposition: A | Discharge status: Home / Self Care | Payer: BC Managed Care – PPO | Source: Ambulatory Visit | Attending: Surgery | Admitting: Surgery

## 2023-01-09 DIAGNOSIS — K449 Diaphragmatic hernia without obstruction or gangrene: Secondary | ICD-10-CM | POA: Diagnosis not present

## 2023-01-09 DIAGNOSIS — R140 Abdominal distension (gaseous): Secondary | ICD-10-CM | POA: Insufficient documentation

## 2023-01-09 MED ORDER — DIATRIZOATE MEGLUMINE & SODIUM 66-10 % PO SOLN
120.0000 mL | Freq: Once | ORAL | Status: AC
  Administered 2023-01-09: 120 mL via ORAL
  Filled 2023-01-09: qty 120

## 2023-01-13 ENCOUNTER — Encounter (HOSPITAL_COMMUNITY): Payer: Self-pay

## 2023-01-13 ENCOUNTER — Other Ambulatory Visit: Payer: Self-pay

## 2023-01-13 ENCOUNTER — Inpatient Hospital Stay (HOSPITAL_COMMUNITY)
Admission: AD | Admit: 2023-01-13 | Discharge: 2023-04-07 | DRG: 326 | Disposition: E | Payer: BC Managed Care – PPO | Source: Ambulatory Visit | Attending: Surgery | Admitting: Surgery

## 2023-01-13 DIAGNOSIS — Z66 Do not resuscitate: Secondary | ICD-10-CM | POA: Diagnosis not present

## 2023-01-13 DIAGNOSIS — N179 Acute kidney failure, unspecified: Secondary | ICD-10-CM | POA: Diagnosis not present

## 2023-01-13 DIAGNOSIS — Z8619 Personal history of other infectious and parasitic diseases: Secondary | ICD-10-CM

## 2023-01-13 DIAGNOSIS — J151 Pneumonia due to Pseudomonas: Secondary | ICD-10-CM | POA: Diagnosis not present

## 2023-01-13 DIAGNOSIS — Z8679 Personal history of other diseases of the circulatory system: Secondary | ICD-10-CM | POA: Diagnosis not present

## 2023-01-13 DIAGNOSIS — R9431 Abnormal electrocardiogram [ECG] [EKG]: Secondary | ICD-10-CM | POA: Diagnosis not present

## 2023-01-13 DIAGNOSIS — Z8701 Personal history of pneumonia (recurrent): Secondary | ICD-10-CM

## 2023-01-13 DIAGNOSIS — E43 Unspecified severe protein-calorie malnutrition: Secondary | ICD-10-CM | POA: Diagnosis not present

## 2023-01-13 DIAGNOSIS — E872 Acidosis, unspecified: Secondary | ICD-10-CM | POA: Diagnosis present

## 2023-01-13 DIAGNOSIS — E876 Hypokalemia: Secondary | ICD-10-CM | POA: Diagnosis not present

## 2023-01-13 DIAGNOSIS — K7200 Acute and subacute hepatic failure without coma: Secondary | ICD-10-CM | POA: Diagnosis not present

## 2023-01-13 DIAGNOSIS — K30 Functional dyspepsia: Secondary | ICD-10-CM | POA: Diagnosis present

## 2023-01-13 DIAGNOSIS — D65 Disseminated intravascular coagulation [defibrination syndrome]: Secondary | ICD-10-CM | POA: Diagnosis not present

## 2023-01-13 DIAGNOSIS — R1115 Cyclical vomiting syndrome unrelated to migraine: Secondary | ICD-10-CM | POA: Diagnosis not present

## 2023-01-13 DIAGNOSIS — K9189 Other postprocedural complications and disorders of digestive system: Principal | ICD-10-CM | POA: Diagnosis present

## 2023-01-13 DIAGNOSIS — J9690 Respiratory failure, unspecified, unspecified whether with hypoxia or hypercapnia: Secondary | ICD-10-CM | POA: Diagnosis not present

## 2023-01-13 DIAGNOSIS — K5989 Other specified functional intestinal disorders: Secondary | ICD-10-CM | POA: Diagnosis not present

## 2023-01-13 DIAGNOSIS — D62 Acute posthemorrhagic anemia: Secondary | ICD-10-CM | POA: Diagnosis not present

## 2023-01-13 DIAGNOSIS — E7220 Disorder of urea cycle metabolism, unspecified: Secondary | ICD-10-CM | POA: Diagnosis not present

## 2023-01-13 DIAGNOSIS — R7989 Other specified abnormal findings of blood chemistry: Secondary | ICD-10-CM | POA: Diagnosis not present

## 2023-01-13 DIAGNOSIS — K317 Polyp of stomach and duodenum: Secondary | ICD-10-CM | POA: Diagnosis not present

## 2023-01-13 DIAGNOSIS — J9601 Acute respiratory failure with hypoxia: Secondary | ICD-10-CM | POA: Diagnosis not present

## 2023-01-13 DIAGNOSIS — R609 Edema, unspecified: Secondary | ICD-10-CM | POA: Diagnosis not present

## 2023-01-13 DIAGNOSIS — E538 Deficiency of other specified B group vitamins: Secondary | ICD-10-CM | POA: Diagnosis present

## 2023-01-13 DIAGNOSIS — K2901 Acute gastritis with bleeding: Principal | ICD-10-CM

## 2023-01-13 DIAGNOSIS — N390 Urinary tract infection, site not specified: Secondary | ICD-10-CM | POA: Diagnosis not present

## 2023-01-13 DIAGNOSIS — R600 Localized edema: Secondary | ICD-10-CM | POA: Diagnosis not present

## 2023-01-13 DIAGNOSIS — Y838 Other surgical procedures as the cause of abnormal reaction of the patient, or of later complication, without mention of misadventure at the time of the procedure: Secondary | ICD-10-CM | POA: Diagnosis not present

## 2023-01-13 DIAGNOSIS — Y832 Surgical operation with anastomosis, bypass or graft as the cause of abnormal reaction of the patient, or of later complication, without mention of misadventure at the time of the procedure: Secondary | ICD-10-CM | POA: Diagnosis present

## 2023-01-13 DIAGNOSIS — E8721 Acute metabolic acidosis: Secondary | ICD-10-CM | POA: Diagnosis not present

## 2023-01-13 DIAGNOSIS — Z806 Family history of leukemia: Secondary | ICD-10-CM

## 2023-01-13 DIAGNOSIS — K315 Obstruction of duodenum: Secondary | ICD-10-CM | POA: Diagnosis not present

## 2023-01-13 DIAGNOSIS — E46 Unspecified protein-calorie malnutrition: Secondary | ICD-10-CM | POA: Diagnosis not present

## 2023-01-13 DIAGNOSIS — E870 Hyperosmolality and hypernatremia: Secondary | ICD-10-CM | POA: Diagnosis not present

## 2023-01-13 DIAGNOSIS — R578 Other shock: Secondary | ICD-10-CM | POA: Diagnosis not present

## 2023-01-13 DIAGNOSIS — R627 Adult failure to thrive: Secondary | ICD-10-CM | POA: Diagnosis not present

## 2023-01-13 DIAGNOSIS — G9341 Metabolic encephalopathy: Secondary | ICD-10-CM | POA: Diagnosis not present

## 2023-01-13 DIAGNOSIS — R17 Unspecified jaundice: Secondary | ICD-10-CM | POA: Diagnosis not present

## 2023-01-13 DIAGNOSIS — Z9049 Acquired absence of other specified parts of digestive tract: Secondary | ICD-10-CM

## 2023-01-13 DIAGNOSIS — S31109A Unspecified open wound of abdominal wall, unspecified quadrant without penetration into peritoneal cavity, initial encounter: Secondary | ICD-10-CM | POA: Diagnosis not present

## 2023-01-13 DIAGNOSIS — K55019 Acute (reversible) ischemia of small intestine, extent unspecified: Secondary | ICD-10-CM | POA: Diagnosis not present

## 2023-01-13 DIAGNOSIS — R54 Age-related physical debility: Secondary | ICD-10-CM | POA: Diagnosis present

## 2023-01-13 DIAGNOSIS — T8119XA Other postprocedural shock, initial encounter: Secondary | ICD-10-CM | POA: Diagnosis not present

## 2023-01-13 DIAGNOSIS — Z9911 Dependence on respirator [ventilator] status: Secondary | ICD-10-CM | POA: Diagnosis not present

## 2023-01-13 DIAGNOSIS — I808 Phlebitis and thrombophlebitis of other sites: Secondary | ICD-10-CM | POA: Diagnosis not present

## 2023-01-13 DIAGNOSIS — Z8249 Family history of ischemic heart disease and other diseases of the circulatory system: Secondary | ICD-10-CM

## 2023-01-13 DIAGNOSIS — R112 Nausea with vomiting, unspecified: Secondary | ICD-10-CM | POA: Diagnosis not present

## 2023-01-13 DIAGNOSIS — Z9079 Acquired absence of other genital organ(s): Secondary | ICD-10-CM

## 2023-01-13 DIAGNOSIS — J9811 Atelectasis: Secondary | ICD-10-CM | POA: Diagnosis not present

## 2023-01-13 DIAGNOSIS — K631 Perforation of intestine (nontraumatic): Secondary | ICD-10-CM | POA: Diagnosis not present

## 2023-01-13 DIAGNOSIS — F05 Delirium due to known physiological condition: Secondary | ICD-10-CM | POA: Diagnosis not present

## 2023-01-13 DIAGNOSIS — Z803 Family history of malignant neoplasm of breast: Secondary | ICD-10-CM

## 2023-01-13 DIAGNOSIS — T85638A Leakage of other specified internal prosthetic devices, implants and grafts, initial encounter: Secondary | ICD-10-CM | POA: Diagnosis not present

## 2023-01-13 DIAGNOSIS — K208 Other esophagitis without bleeding: Secondary | ICD-10-CM | POA: Diagnosis present

## 2023-01-13 DIAGNOSIS — I728 Aneurysm of other specified arteries: Secondary | ICD-10-CM | POA: Diagnosis not present

## 2023-01-13 DIAGNOSIS — K760 Fatty (change of) liver, not elsewhere classified: Secondary | ICD-10-CM | POA: Diagnosis not present

## 2023-01-13 DIAGNOSIS — R918 Other nonspecific abnormal finding of lung field: Secondary | ICD-10-CM | POA: Diagnosis not present

## 2023-01-13 DIAGNOSIS — K2970 Gastritis, unspecified, without bleeding: Secondary | ICD-10-CM | POA: Diagnosis not present

## 2023-01-13 DIAGNOSIS — L89121 Pressure ulcer of left upper back, stage 1: Secondary | ICD-10-CM | POA: Diagnosis not present

## 2023-01-13 DIAGNOSIS — K567 Ileus, unspecified: Secondary | ICD-10-CM | POA: Diagnosis not present

## 2023-01-13 DIAGNOSIS — Z86010 Personal history of colonic polyps: Secondary | ICD-10-CM | POA: Diagnosis not present

## 2023-01-13 DIAGNOSIS — Z4659 Encounter for fitting and adjustment of other gastrointestinal appliance and device: Secondary | ICD-10-CM | POA: Diagnosis not present

## 2023-01-13 DIAGNOSIS — K254 Chronic or unspecified gastric ulcer with hemorrhage: Secondary | ICD-10-CM | POA: Diagnosis not present

## 2023-01-13 DIAGNOSIS — R571 Hypovolemic shock: Secondary | ICD-10-CM | POA: Diagnosis not present

## 2023-01-13 DIAGNOSIS — Z781 Physical restraint status: Secondary | ICD-10-CM

## 2023-01-13 DIAGNOSIS — L8990 Pressure ulcer of unspecified site, unspecified stage: Secondary | ICD-10-CM | POA: Diagnosis not present

## 2023-01-13 DIAGNOSIS — I9589 Other hypotension: Secondary | ICD-10-CM | POA: Diagnosis not present

## 2023-01-13 DIAGNOSIS — Z980 Intestinal bypass and anastomosis status: Secondary | ICD-10-CM | POA: Diagnosis not present

## 2023-01-13 DIAGNOSIS — K922 Gastrointestinal hemorrhage, unspecified: Secondary | ICD-10-CM | POA: Diagnosis not present

## 2023-01-13 DIAGNOSIS — R64 Cachexia: Secondary | ICD-10-CM | POA: Diagnosis not present

## 2023-01-13 DIAGNOSIS — S36430A Laceration of duodenum, initial encounter: Secondary | ICD-10-CM | POA: Diagnosis not present

## 2023-01-13 DIAGNOSIS — Z7189 Other specified counseling: Secondary | ICD-10-CM | POA: Diagnosis not present

## 2023-01-13 DIAGNOSIS — K661 Hemoperitoneum: Secondary | ICD-10-CM | POA: Diagnosis not present

## 2023-01-13 DIAGNOSIS — Z833 Family history of diabetes mellitus: Secondary | ICD-10-CM

## 2023-01-13 DIAGNOSIS — K7201 Acute and subacute hepatic failure with coma: Secondary | ICD-10-CM | POA: Diagnosis not present

## 2023-01-13 DIAGNOSIS — N178 Other acute kidney failure: Secondary | ICD-10-CM | POA: Diagnosis not present

## 2023-01-13 DIAGNOSIS — K7689 Other specified diseases of liver: Secondary | ICD-10-CM | POA: Diagnosis present

## 2023-01-13 DIAGNOSIS — R634 Abnormal weight loss: Secondary | ICD-10-CM | POA: Diagnosis not present

## 2023-01-13 DIAGNOSIS — K56609 Unspecified intestinal obstruction, unspecified as to partial versus complete obstruction: Secondary | ICD-10-CM | POA: Diagnosis not present

## 2023-01-13 DIAGNOSIS — F419 Anxiety disorder, unspecified: Secondary | ICD-10-CM | POA: Diagnosis present

## 2023-01-13 DIAGNOSIS — T8089XA Other complications following infusion, transfusion and therapeutic injection, initial encounter: Secondary | ICD-10-CM | POA: Diagnosis not present

## 2023-01-13 DIAGNOSIS — Z452 Encounter for adjustment and management of vascular access device: Secondary | ICD-10-CM | POA: Diagnosis not present

## 2023-01-13 DIAGNOSIS — M1990 Unspecified osteoarthritis, unspecified site: Secondary | ICD-10-CM | POA: Diagnosis present

## 2023-01-13 DIAGNOSIS — N400 Enlarged prostate without lower urinary tract symptoms: Secondary | ICD-10-CM | POA: Diagnosis present

## 2023-01-13 DIAGNOSIS — I959 Hypotension, unspecified: Secondary | ICD-10-CM | POA: Diagnosis not present

## 2023-01-13 DIAGNOSIS — K55039 Acute (reversible) ischemia of large intestine, extent unspecified: Secondary | ICD-10-CM | POA: Diagnosis not present

## 2023-01-13 DIAGNOSIS — K551 Chronic vascular disorders of intestine: Secondary | ICD-10-CM | POA: Diagnosis not present

## 2023-01-13 DIAGNOSIS — R5381 Other malaise: Secondary | ICD-10-CM | POA: Diagnosis not present

## 2023-01-13 DIAGNOSIS — I440 Atrioventricular block, first degree: Secondary | ICD-10-CM | POA: Diagnosis not present

## 2023-01-13 DIAGNOSIS — L89111 Pressure ulcer of right upper back, stage 1: Secondary | ICD-10-CM | POA: Diagnosis not present

## 2023-01-13 DIAGNOSIS — K2971 Gastritis, unspecified, with bleeding: Secondary | ICD-10-CM | POA: Diagnosis not present

## 2023-01-13 DIAGNOSIS — K559 Vascular disorder of intestine, unspecified: Secondary | ICD-10-CM | POA: Diagnosis not present

## 2023-01-13 DIAGNOSIS — Z4682 Encounter for fitting and adjustment of non-vascular catheter: Secondary | ICD-10-CM | POA: Diagnosis not present

## 2023-01-13 DIAGNOSIS — R0902 Hypoxemia: Secondary | ICD-10-CM | POA: Diagnosis not present

## 2023-01-13 DIAGNOSIS — I509 Heart failure, unspecified: Secondary | ICD-10-CM | POA: Diagnosis not present

## 2023-01-13 DIAGNOSIS — R7401 Elevation of levels of liver transaminase levels: Secondary | ICD-10-CM | POA: Diagnosis not present

## 2023-01-13 DIAGNOSIS — Z8719 Personal history of other diseases of the digestive system: Secondary | ICD-10-CM

## 2023-01-13 DIAGNOSIS — J90 Pleural effusion, not elsewhere classified: Secondary | ICD-10-CM | POA: Diagnosis not present

## 2023-01-13 DIAGNOSIS — K9289 Other specified diseases of the digestive system: Secondary | ICD-10-CM | POA: Diagnosis present

## 2023-01-13 DIAGNOSIS — K660 Peritoneal adhesions (postprocedural) (postinfection): Secondary | ICD-10-CM | POA: Diagnosis present

## 2023-01-13 DIAGNOSIS — Z681 Body mass index (BMI) 19 or less, adult: Secondary | ICD-10-CM | POA: Diagnosis not present

## 2023-01-13 DIAGNOSIS — K565 Intestinal adhesions [bands], unspecified as to partial versus complete obstruction: Secondary | ICD-10-CM | POA: Diagnosis not present

## 2023-01-13 DIAGNOSIS — Z79899 Other long term (current) drug therapy: Secondary | ICD-10-CM

## 2023-01-13 DIAGNOSIS — K55029 Acute infarction of small intestine, extent unspecified: Secondary | ICD-10-CM | POA: Diagnosis not present

## 2023-01-13 DIAGNOSIS — S31609A Unspecified open wound of abdominal wall, unspecified quadrant with penetration into peritoneal cavity, initial encounter: Secondary | ICD-10-CM | POA: Diagnosis not present

## 2023-01-13 DIAGNOSIS — R739 Hyperglycemia, unspecified: Secondary | ICD-10-CM | POA: Diagnosis not present

## 2023-01-13 DIAGNOSIS — N1330 Unspecified hydronephrosis: Secondary | ICD-10-CM | POA: Diagnosis not present

## 2023-01-13 DIAGNOSIS — E878 Other disorders of electrolyte and fluid balance, not elsewhere classified: Secondary | ICD-10-CM | POA: Diagnosis not present

## 2023-01-13 DIAGNOSIS — E860 Dehydration: Secondary | ICD-10-CM | POA: Diagnosis present

## 2023-01-13 DIAGNOSIS — K56600 Partial intestinal obstruction, unspecified as to cause: Secondary | ICD-10-CM | POA: Diagnosis not present

## 2023-01-13 DIAGNOSIS — K3189 Other diseases of stomach and duodenum: Secondary | ICD-10-CM | POA: Diagnosis not present

## 2023-01-13 DIAGNOSIS — K920 Hematemesis: Secondary | ICD-10-CM | POA: Diagnosis not present

## 2023-01-13 DIAGNOSIS — Z87440 Personal history of urinary (tract) infections: Secondary | ICD-10-CM

## 2023-01-13 LAB — CBC
HCT: 34.9 % — ABNORMAL LOW (ref 39.0–52.0)
Hemoglobin: 11.9 g/dL — ABNORMAL LOW (ref 13.0–17.0)
MCH: 31.8 pg (ref 26.0–34.0)
MCHC: 34.1 g/dL (ref 30.0–36.0)
MCV: 93.3 fL (ref 80.0–100.0)
Platelets: 156 10*3/uL (ref 150–400)
RBC: 3.74 MIL/uL — ABNORMAL LOW (ref 4.22–5.81)
RDW: 14.7 % (ref 11.5–15.5)
WBC: 6.6 10*3/uL (ref 4.0–10.5)
nRBC: 0 % (ref 0.0–0.2)

## 2023-01-13 LAB — PHOSPHORUS: Phosphorus: 3.7 mg/dL (ref 2.5–4.6)

## 2023-01-13 LAB — COMPREHENSIVE METABOLIC PANEL
ALT: 26 U/L (ref 0–44)
AST: 23 U/L (ref 15–41)
Albumin: 3.7 g/dL (ref 3.5–5.0)
Alkaline Phosphatase: 59 U/L (ref 38–126)
Anion gap: 8 (ref 5–15)
BUN: 42 mg/dL — ABNORMAL HIGH (ref 8–23)
CO2: 18 mmol/L — ABNORMAL LOW (ref 22–32)
Calcium: 7.1 mg/dL — ABNORMAL LOW (ref 8.9–10.3)
Chloride: 114 mmol/L — ABNORMAL HIGH (ref 98–111)
Creatinine, Ser: 1.36 mg/dL — ABNORMAL HIGH (ref 0.61–1.24)
GFR, Estimated: 57 mL/min — ABNORMAL LOW (ref >60)
Glucose, Bld: 87 mg/dL (ref 70–99)
Potassium: 2.4 mmol/L — CL (ref 3.5–5.1)
Sodium: 140 mmol/L (ref 135–145)
Total Bilirubin: 1 mg/dL (ref 0.3–1.2)
Total Protein: 5.9 g/dL — ABNORMAL LOW (ref 6.5–8.1)

## 2023-01-13 LAB — PREALBUMIN: Prealbumin: 11 mg/dL — ABNORMAL LOW (ref 18–38)

## 2023-01-13 LAB — HIV ANTIBODY (ROUTINE TESTING W REFLEX): HIV Screen 4th Generation wRfx: NONREACTIVE

## 2023-01-13 LAB — MAGNESIUM: Magnesium: 0.7 mg/dL — CL (ref 1.7–2.4)

## 2023-01-13 MED ORDER — SODIUM CHLORIDE 0.9% FLUSH
10.0000 mL | INTRAVENOUS | Status: DC | PRN
  Administered 2023-03-07: 20 mL

## 2023-01-13 MED ORDER — ONDANSETRON HCL 4 MG/2ML IJ SOLN
4.0000 mg | Freq: Four times a day (QID) | INTRAMUSCULAR | Status: DC | PRN
  Administered 2023-01-18 – 2023-03-02 (×4): 4 mg via INTRAVENOUS
  Filled 2023-01-13 (×4): qty 2

## 2023-01-13 MED ORDER — LACTATED RINGERS IV BOLUS
1000.0000 mL | Freq: Once | INTRAVENOUS | Status: AC
  Administered 2023-01-13: 1000 mL via INTRAVENOUS

## 2023-01-13 MED ORDER — MAGNESIUM SULFATE 4 GM/100ML IV SOLN
4.0000 g | Freq: Once | INTRAVENOUS | Status: AC
  Administered 2023-01-13: 4 g via INTRAVENOUS
  Filled 2023-01-13: qty 100

## 2023-01-13 MED ORDER — MORPHINE SULFATE (PF) 2 MG/ML IV SOLN
2.0000 mg | INTRAVENOUS | Status: DC | PRN
  Administered 2023-01-19: 2 mg via INTRAVENOUS
  Filled 2023-01-13: qty 1

## 2023-01-13 MED ORDER — KCL IN DEXTROSE-NACL 20-5-0.45 MEQ/L-%-% IV SOLN
INTRAVENOUS | Status: DC
  Filled 2023-01-13 (×2): qty 1000

## 2023-01-13 MED ORDER — CHLORHEXIDINE GLUCONATE CLOTH 2 % EX PADS
6.0000 | MEDICATED_PAD | Freq: Every day | CUTANEOUS | Status: DC
  Administered 2023-01-14 – 2023-03-09 (×51): 6 via TOPICAL

## 2023-01-13 MED ORDER — DIPHENHYDRAMINE HCL 50 MG/ML IJ SOLN
12.5000 mg | Freq: Four times a day (QID) | INTRAMUSCULAR | Status: DC | PRN
  Administered 2023-01-21 – 2023-01-22 (×4): 12.5 mg via INTRAVENOUS
  Filled 2023-01-13 (×4): qty 1

## 2023-01-13 MED ORDER — SODIUM CHLORIDE 0.9% FLUSH
10.0000 mL | Freq: Two times a day (BID) | INTRAVENOUS | Status: DC
  Administered 2023-01-19: 20 mL
  Administered 2023-01-19: 10 mL
  Administered 2023-01-20: 60 mL
  Administered 2023-01-20: 10 mL
  Administered 2023-01-21: 20 mL
  Administered 2023-01-21 – 2023-01-22 (×2): 10 mL
  Administered 2023-01-22: 20 mL
  Administered 2023-01-23: 10 mL
  Administered 2023-01-23: 40 mL
  Administered 2023-01-24 – 2023-01-26 (×5): 10 mL
  Administered 2023-01-28: 30 mL
  Administered 2023-01-28: 100 mL
  Administered 2023-01-29: 30 mL
  Administered 2023-01-30: 20 mL
  Administered 2023-01-31 – 2023-02-01 (×4): 10 mL
  Administered 2023-02-02: 20 mL
  Administered 2023-02-02: 10 mL
  Administered 2023-02-03 – 2023-02-04 (×3): 20 mL
  Administered 2023-02-04 – 2023-02-05 (×3): 10 mL
  Administered 2023-02-06: 30 mL
  Administered 2023-02-06: 10 mL
  Administered 2023-02-07: 30 mL
  Administered 2023-02-07 – 2023-02-08 (×3): 10 mL
  Administered 2023-02-09: 20 mL
  Administered 2023-02-09: 10 mL
  Administered 2023-02-10: 20 mL
  Administered 2023-02-10: 10 mL
  Administered 2023-02-11: 20 mL
  Administered 2023-02-11: 10 mL
  Administered 2023-02-12: 20 mL
  Administered 2023-02-13: 10 mL
  Administered 2023-02-13 – 2023-02-14 (×2): 20 mL
  Administered 2023-02-14 – 2023-02-19 (×9): 10 mL
  Administered 2023-02-19: 30 mL
  Administered 2023-02-20 – 2023-02-21 (×4): 10 mL
  Administered 2023-02-22: 20 mL
  Administered 2023-02-22 – 2023-02-24 (×5): 10 mL
  Administered 2023-02-25: 20 mL
  Administered 2023-02-25 – 2023-02-27 (×5): 10 mL
  Administered 2023-02-28: 30 mL
  Administered 2023-02-28: 10 mL
  Administered 2023-03-01: 20 mL
  Administered 2023-03-01: 10 mL
  Administered 2023-03-02: 30 mL
  Administered 2023-03-02 – 2023-03-04 (×5): 10 mL
  Administered 2023-03-05: 30 mL
  Administered 2023-03-05 – 2023-03-09 (×7): 10 mL
  Administered 2023-03-09: 30 mL

## 2023-01-13 MED ORDER — ENOXAPARIN SODIUM 40 MG/0.4ML IJ SOSY
40.0000 mg | PREFILLED_SYRINGE | INTRAMUSCULAR | Status: DC
  Administered 2023-01-13 – 2023-01-14 (×2): 40 mg via SUBCUTANEOUS
  Filled 2023-01-13 (×2): qty 0.4

## 2023-01-13 MED ORDER — POTASSIUM CHLORIDE 10 MEQ/100ML IV SOLN
10.0000 meq | INTRAVENOUS | Status: AC
  Administered 2023-01-13 (×6): 10 meq via INTRAVENOUS
  Filled 2023-01-13 (×6): qty 100

## 2023-01-13 MED ORDER — ACETAMINOPHEN 500 MG PO TABS
1000.0000 mg | ORAL_TABLET | Freq: Four times a day (QID) | ORAL | Status: DC | PRN
  Filled 2023-01-13: qty 2

## 2023-01-13 NOTE — Progress Notes (Signed)
Peripherally Inserted Central Catheter Placement  The IV Nurse has discussed with the patient and/or persons authorized to consent for the patient, the purpose of this procedure and the potential benefits and risks involved with this procedure.  The benefits include less needle sticks, lab draws from the catheter, and the patient may be discharged home with the catheter. Risks include, but not limited to, infection, bleeding, blood clot (thrombus formation), and puncture of an artery; nerve damage and irregular heartbeat and possibility to perform a PICC exchange if needed/ordered by physician.  Alternatives to this procedure were also discussed.  Bard Power PICC patient education guide, fact sheet on infection prevention and patient information card has been provided to patient /or left at bedside.    PICC Placement Documentation  PICC Double Lumen 01/13/23 Right Basilic 39 cm 0 cm (Active)  Indication for Insertion or Continuance of Line Administration of hyperosmolar/irritating solutions (i.e. TPN, Vancomycin, etc.) 01/13/23 2129  Exposed Catheter (cm) 0 cm 01/13/23 2129  Site Assessment Clean, Dry, Intact 01/13/23 2129  Lumen #1 Status Flushed;Blood return noted;Saline locked 01/13/23 2129  Lumen #2 Status Flushed;Blood return noted;Saline locked 01/13/23 2129  Dressing Type Transparent 01/13/23 2129  Dressing Status Antimicrobial disc in place 01/13/23 2129  Safety Lock Not Applicable 01/13/23 2129  Line Care Connections checked and tightened 01/13/23 2129  Line Adjustment (NICU/IV Team Only) No 01/13/23 2129  Dressing Intervention New dressing 01/13/23 2129  Dressing Change Due 01/20/23 01/13/23 2129       Audrie Gallus 01/13/2023, 9:32 PM

## 2023-01-13 NOTE — H&P (Cosign Needed Addendum)
Bradley Dominguez 26-Dec-1955  [id number].     HPI:  Bradley Dominguez is a 67 yo male who was directly admitted from home today with worsening nausea, vomiting, bloating and weakness. He was initially seen by Dr. Freida Busman in consultation in December 2023. At that time he had had a self-limited episode of abdominal distension, at which time a CT scan by his PCP showed diffuse small bowel dilation without a distinct transition. His symptoms had spontaneously resolved at his visit in the office, and reported having these symptoms intermittently for many years, which were always self-limited. Since then, he has developed progressive PO intolerance and vomiting, and now has symptoms daily. He is usually able to eat a small amount at breakfast and drink some liquids, but by evening feels distended and bloated, and often vomits. He has continued to have bowel movements. Symptoms usually resolve by the following morning. He has lost weight over the last few months. He was seen by GI and had a KUB on 3/22, which showed small bowel distention that appeared stable compared to his prior CT. He was seen in follow up in the office on 3/28, at which time he was sent for an upper GI series. This was completed last week and showed retention of the contrast within the duodenum, with passage of a very small amount of contrast beyond this. Today his wife called the office and reports he has continued to worsen, and now is only taking very small amounts by mouth. He feels week. He was directly admitted from home to [facility]. Currently reports constant nausea but no vomiting. Feels bloated but denies any abdominal pain. Passing flatus. Last bm today.   He has had multiple previous abdominal surgeries at [facility], the most recent of which was in the 1970s. We do not have records from those surgeries, but his wife reports they were told at the time that his stomach was obstructed and he had some type of bypass surgery.   ROS: Review  of Systems  Constitutional:  Negative for chills and fever.  Respiratory:  Negative for shortness of breath.   Gastrointestinal:  Positive for nausea and vomiting. Negative for constipation.  Neurological:  Positive for weakness.    Family History  Problem Relation Age of Onset   Breast cancer Mother    Heart attack Father    Diabetes Other    Colon cancer Neg Hx    Esophageal cancer Neg Hx    Stomach cancer Neg Hx    Colon polyps Neg Hx    Rectal cancer Neg Hx     Past Medical History:  Diagnosis Date   Arthritis    HANDS   Bowel obstruction (HCC)    CHF (congestive heart failure) (HCC) 11/26/2018   Elevated brain natriuretic peptide (BNP) level 11/25/2018   Family history of adverse reaction to anesthesia    FATHER SLOW TO AWAKEN, MOTHER POST OP N/V   Frequent UTI    History of colon polyps 2009   History of shingles    Plantar fasciitis    Prostate hypertrophy    Vitamin B 12 deficiency     Past Surgical History:  Procedure Laterality Date   APPENDECTOMY     CHOLECYSTECTOMY     COLONSCOPY  AGE 82   WITH POLYP REMOVED   SMALL INTESTINE SURGERY     1979   TRANSURETHRAL RESECTION OF PROSTATE N/A 09/22/2017   Procedure: TRANSURETHRAL RESECTION OF THE PROSTATE (TURP);  Surgeon: Malen Gauze,  MD;  Location: [facility];  Service: Urology;  Laterality: N/A;    Social History:  reports that he has never smoked. He has never used smokeless tobacco. He reports that he does not drink alcohol and does not use drugs.  Allergies: No Known Allergies  No medications prior to admission.    Physical Exam: Blood pressure 103/68, pulse 70, temperature (!) 97.3 F (36.3 C), temperature source Oral, resp. rate 18, SpO2 100 %. General: pleasant, WD/WN male who is laying in bed in NAD HEENT: head is normocephalic, atraumatic.  Sclera are noninjected.  PERRL.  Ears and nose without any masses or lesions.  Mouth is pink and moist. Dentition fair Heart:  regular, rate, and rhythm.  Lungs: CTAB, no wheezes, rhonchi, or rales noted.  Respiratory effort nonlabored Abd:  Soft, mild to moderate distension, completely NT. No rigidity or guarding. +BS. No obvious masses, hernias, or organomegaly. Midline scar well healed. There is a small area of dark red skin around the top of his incision but no heat, induration or drainage.  MS: no BUE or BLE edema, calves soft and nontender Skin: warm and dry  Psych: A&Ox4 with an appropriate affect Neuro: cranial nerves grossly intact, normal speech, thought process intact, moves all extremities, gait not assessed  No results found for this or any previous visit (from the past 48 hour(s)). No results found.   Assessment/Plan 67 yo male with a remote history of multiple prior laparotomies, with a chronic partial small bowel obstruction. Symptoms have worsened in the last few months, and he now has very minimal oral intake and weakness, with daily symptoms and ongoing weight loss. - NPO except ice chips, IV fluid hydration - Labs pending, including nutrition labs - Given weight loss and minimal oral intake, place PICC and begin TPN for nutritional support. - This is presumably an adhesive obstruction, however the specific site is not clear. Previous CT showed diffuse small bowel dilation, however the recent upper GI showed duodenal obstruction. Will obtain a new CT for further workup. If this appears to be a duodenal obstruction, will discuss EGD with GI. - If persistent emesis, place NG tube - VTE: lovenox, SCDs - Admit to inpatient. May ultimately need surgical intervention, which would likely require laparotomy and lysis of adhesions.   Leary Roca, PA-C [facility] 01/13/23 3:54 PM

## 2023-01-13 NOTE — Progress Notes (Signed)
PHARMACY - TOTAL PARENTERAL NUTRITION CONSULT NOTE   Indication:  Intolerance to PO intake, partial SBO  Patient Measurements:     There is no height or weight on file to calculate BMI. Last weight 73 kg (12/27/22)  Assessment: 78 yoM admitted on 4/8 with intolerance to PO intake, vomiting, chronic partial SBO, and history of multiple previous laparotomies. He reports minimal PO intake, weakness, ongoing weight loss, worsening over the last few months.  Glucose / Insulin: No known hx diabetes, no PTA diabetic medications.  Glucose 87.   Electrolytes: K 2.4, Cl 114, CorrCa 7.34, Mag 0.7.  Na, Phos WNL Renal: SCr elevated at 1.36, BUN 42 Hepatic: AST/ALT, Tbili WNL Intake / Output; MIVF:  mIVF: D5, 0.45% NaCl + KCl 20 mEq/L @ 100 ml/hr GI Imaging: 01/09/23 Upper GI series:  chronic partial small bowel obstruction or ileus. Tiny sliding hiatal hernia. GI Surgeries / Procedures:   Central access: PICC placement ordered 4/8.  TPN start date: 4/9  Nutritional Goals: Goal TPN rate is mL/hr to provide g of protein and kcals per day.    RD Assessment:    Current Nutrition:  NPO  Plan:  TPN consult received after cut-off time today and will not be able to start TPN on 4/8.  Pharmacy to dose TPN to start on 4/9 pending PICC placement.   Continue IVF; Elytes replacement by surgery: KCl 10 mEq IV x6 runs and Mag 4g x1. Monitor TPN labs on Mon/Thurs, and BMET/Mag/Phos daily x3 days.   Lynann Beaver PharmD, BCPS [facility] main pharmacy [phone] 01/13/2023,3:22 PM

## 2023-01-14 ENCOUNTER — Inpatient Hospital Stay (HOSPITAL_COMMUNITY): Payer: BC Managed Care – PPO

## 2023-01-14 ENCOUNTER — Ambulatory Visit (HOSPITAL_COMMUNITY): Admission: RE | Admit: 2023-01-14 | Payer: BC Managed Care – PPO | Source: Ambulatory Visit

## 2023-01-14 DIAGNOSIS — E43 Unspecified severe protein-calorie malnutrition: Secondary | ICD-10-CM | POA: Diagnosis present

## 2023-01-14 LAB — BASIC METABOLIC PANEL
Anion gap: 7 (ref 5–15)
BUN: 35 mg/dL — ABNORMAL HIGH (ref 8–23)
CO2: 17 mmol/L — ABNORMAL LOW (ref 22–32)
Calcium: 7 mg/dL — ABNORMAL LOW (ref 8.9–10.3)
Chloride: 115 mmol/L — ABNORMAL HIGH (ref 98–111)
Creatinine, Ser: 1.28 mg/dL — ABNORMAL HIGH (ref 0.61–1.24)
GFR, Estimated: >60 mL/min (ref >60)
Glucose, Bld: 104 mg/dL — ABNORMAL HIGH (ref 70–99)
Potassium: 2.9 mmol/L — ABNORMAL LOW (ref 3.5–5.1)
Sodium: 139 mmol/L (ref 135–145)

## 2023-01-14 LAB — TRIGLYCERIDES: Triglycerides: 25 mg/dL (ref <150)

## 2023-01-14 LAB — GLUCOSE, CAPILLARY
Glucose-Capillary: 104 mg/dL — ABNORMAL HIGH (ref 70–99)
Glucose-Capillary: 139 mg/dL — ABNORMAL HIGH (ref 70–99)

## 2023-01-14 LAB — MAGNESIUM: Magnesium: 1.6 mg/dL — ABNORMAL LOW (ref 1.7–2.4)

## 2023-01-14 LAB — PHOSPHORUS: Phosphorus: 2.3 mg/dL — ABNORMAL LOW (ref 2.5–4.6)

## 2023-01-14 MED ORDER — POTASSIUM CHLORIDE 10 MEQ/50ML IV SOLN
10.0000 meq | INTRAVENOUS | Status: AC
  Administered 2023-01-14 (×4): 10 meq via INTRAVENOUS
  Filled 2023-01-14 (×4): qty 50

## 2023-01-14 MED ORDER — IOHEXOL 9 MG/ML PO SOLN
500.0000 mL | ORAL | Status: AC
  Administered 2023-01-14 (×2): 500 mL via ORAL

## 2023-01-14 MED ORDER — POTASSIUM PHOSPHATES 15 MMOLE/5ML IV SOLN: 30.0000 mmol | Freq: Once | INTRAVENOUS | Status: DC

## 2023-01-14 MED ORDER — POTASSIUM PHOSPHATES 15 MMOLE/5ML IV SOLN
20.0000 mmol | Freq: Once | INTRAVENOUS | Status: AC
  Administered 2023-01-14: 20 mmol via INTRAVENOUS
  Filled 2023-01-14 (×2): qty 6.67

## 2023-01-14 MED ORDER — DEXTROSE-NACL 5-0.45 % IV SOLN: INTRAVENOUS | Status: AC

## 2023-01-14 MED ORDER — INSULIN ASPART 100 UNIT/ML IJ SOLN
0.0000 [IU] | Freq: Four times a day (QID) | INTRAMUSCULAR | Status: DC
  Administered 2023-01-14: 1 [IU] via SUBCUTANEOUS

## 2023-01-14 MED ORDER — MAGNESIUM SULFATE 4 GM/100ML IV SOLN
4.0000 g | INTRAVENOUS | Status: AC
  Administered 2023-01-14: 4 g via INTRAVENOUS
  Filled 2023-01-14: qty 100

## 2023-01-14 MED ORDER — SODIUM CHLORIDE 0.9 % IV SOLN: INTRAVENOUS | Status: AC

## 2023-01-14 MED ORDER — POTASSIUM CHLORIDE 10 MEQ/50ML IV SOLN
10.0000 meq | INTRAVENOUS | Status: DC
  Filled 2023-01-14 (×3): qty 50

## 2023-01-14 MED ORDER — IOHEXOL 9 MG/ML PO SOLN
ORAL | Status: AC
  Filled 2023-01-14: qty 1000

## 2023-01-14 MED ORDER — IOHEXOL 300 MG/ML  SOLN
100.0000 mL | Freq: Once | INTRAMUSCULAR | Status: AC | PRN
  Administered 2023-01-14: 100 mL via INTRAVENOUS

## 2023-01-14 MED ORDER — CALCIUM GLUCONATE-NACL 1-0.675 GM/50ML-% IV SOLN
1.0000 g | Freq: Once | INTRAVENOUS | Status: AC
  Administered 2023-01-14: 1000 mg via INTRAVENOUS
  Filled 2023-01-14: qty 50

## 2023-01-14 MED ORDER — TRAVASOL 10 % IV SOLN
INTRAVENOUS | Status: AC
  Filled 2023-01-14: qty 396

## 2023-01-14 MED ORDER — POTASSIUM CHLORIDE 10 MEQ/100ML IV SOLN: 10.0000 meq | INTRAVENOUS | Status: DC

## 2023-01-14 NOTE — Progress Notes (Addendum)
Initial Nutrition Assessment  DOCUMENTATION CODES:   Severe malnutrition in context of chronic illness  INTERVENTION:   Monitor magnesium, potassium, and phosphorus BID for at least 3 days, MD to replete as needed, as pt is at risk for refeeding syndrome.  -TPN management per Pharmacy -Recommend 100 mg Thiamine x 5 days given refeeding risk -Daily weights while on TPN  -Will monitor for diet advancement  NUTRITION DIAGNOSIS:   Severe Malnutrition related to chronic illness as evidenced by moderate fat depletion, severe muscle depletion, energy intake < or equal to 75% for > or equal to 1 month, percent weight loss.  GOAL:   Patient will meet greater than or equal to 90% of their needs  MONITOR:   Diet advancement, Labs, Weight trends, I & O's (TPN)  REASON FOR ASSESSMENT:   Consult New TPN/TNA  ASSESSMENT:   67 yo male who was admitted from home with worsening nausea, vomiting, bloating and weakness. Patient with remote history of multiple prior laparotomies (in the 1970's), with a chronic partial small bowel obstruction.  Patient in room, family at bedside. Per patient, he has not been able to eat well since December 2023. He is able to eat a breakfast of eggs, toast or whatever he would like. For lunch he may have soup or a sandwich. Pt then states he starts to get so bloated and distended he usually won't eat anything for dinner. Lately this has resulted in vomiting at the end of the day. If he does take in anything it is liquids. Pt has tried supplements such as Ensure or Boost, he has tried to drink electrolyte drinks as well.  Pt ate an egg and tomato sandwich yesterday for breakfast. He stopped at [employer] and ate a french dip with cheese sticks for lunch prior to coming to the hospital. Pt has been having N/V intermittently. Pt states he knows he has been eating foods that are not good for him and his condition. Pt also had chronic diarrhea. Pt open to all supplements  when diet is advanced.  Pt reports UBW ~180-185 lbs.  Per weight records, pt has lost 27 lbs since 05/01/22 (15% wt loss x 8.5 months, significant for time frame).  Medications: D5 infusion, IV Mg sulfate, KCl, K-Phos   Labs reviewed: Low K Low Phos Low Mg   NUTRITION - FOCUSED PHYSICAL EXAM:  Flowsheet Row Most Recent Value  Orbital Region Severe depletion  Upper Arm Region Moderate depletion  Thoracic and Lumbar Region Mild depletion  Buccal Region Moderate depletion  Temple Region Severe depletion  Clavicle Bone Region Severe depletion  Clavicle and Acromion Bone Region Severe depletion  Scapular Bone Region Moderate depletion  Dorsal Hand Mild depletion  Patellar Region Severe depletion  Anterior Thigh Region Severe depletion  Posterior Calf Region Moderate depletion  Edema (RD Assessment) None  Hair Reviewed  [thinning]  Eyes Reviewed  Mouth Reviewed  Skin Reviewed  [dry, pale]  Nails Reviewed       Diet Order:   Diet Order             Diet NPO time specified Except for: Ice Chips  Diet effective now                   EDUCATION NEEDS:   Education needs have been addressed  Skin:  Skin Assessment: Reviewed RN Assessment  Last BM:  4/9 -type 7  Height:   Ht Readings from Last 1 Encounters:  12/27/22 6\' 1"  (1.854 m)  Weight:   Wt Readings from Last 1 Encounters:  01/14/23 68.1 kg    BMI:  Body mass index is 19.82 kg/m.  Estimated Nutritional Needs:   Kcal:  2050-2250  Protein:  100-115g  Fluid:  2.1L/day  Tilda Franco, MS, RD, LDN Inpatient Clinical Dietitian Contact information available via Amion

## 2023-01-14 NOTE — Progress Notes (Signed)
       Subjective: Feeling better today. BUN/Cr improving. No nausea/vomiting overnight. Not distended this morning.   Objective: Vital signs in last 24 hours: Temp:  [97.3 F (36.3 C)-98 F (36.7 C)] 97.4 F (36.3 C) (04/09 0500) Pulse Rate:  [51-70] 55 (04/09 0500) Resp:  [16-18] 18 (04/09 0500) BP: (88-103)/(59-68) 99/61 (04/09 0500) SpO2:  [99 %-100 %] 100 % (04/09 0500) Weight:  [68.1 kg] 68.1 kg (04/09 0735) Last BM Date : 01/14/23  Intake/Output from previous day: 04/08 0701 - 04/09 0700 In: 2044.2 [I.V.:1051.6; IV Piggyback:992.6] Out: 1100 [Urine:1100] Intake/Output this shift: No intake/output data recorded.  PE: General: resting comfortably, NAD Neuro: alert and oriented, no focal deficits Resp: normal work of breathing on room air Abdomen: soft, nondistended, nontender to palpation. Midline laparotomy scar. Extremities: warm and well-perfused   Lab Results:  Recent Labs    01/13/23 1522  WBC 6.6  HGB 11.9*  HCT 34.9*  PLT 156   BMET Recent Labs    01/13/23 1522 01/14/23 0406  NA 140 139  K 2.4* 2.9*  CL 114* 115*  CO2 18* 17*  GLUCOSE 87 104*  BUN 42* 35*  CREATININE 1.36* 1.28*  CALCIUM 7.1* 7.0*   PT/INR No results for input(s): "LABPROT", "INR" in the last 72 hours. CMP     Component Value Date/Time   NA 139 01/14/2023 0406   K 2.9 (L) 01/14/2023 0406   CL 115 (H) 01/14/2023 0406   CO2 17 (L) 01/14/2023 0406   GLUCOSE 104 (H) 01/14/2023 0406   BUN 35 (H) 01/14/2023 0406   CREATININE 1.28 (H) 01/14/2023 0406   CALCIUM 7.0 (L) 01/14/2023 0406   PROT 5.9 (L) 01/13/2023 1522   ALBUMIN 3.7 01/13/2023 1522   AST 23 01/13/2023 1522   ALT 26 01/13/2023 1522   ALKPHOS 59 01/13/2023 1522   BILITOT 1.0 01/13/2023 1522   GFRNONAA >60 01/14/2023 0406   GFRAA >60 09/23/2017 0507   Lipase  No results found for: "LIPASE"     Studies/Results: [place] EKG SITE RITE  Result Date: 01/13/2023 If Site Rite image not attached, placement could  not be confirmed due to current cardiac rhythm.   Anti-infectives: Anti-infectives (From admission, onward)    None        Assessment/Plan 67 yo male with chronic intermittent partial SBO, presenting with weakness, dehydration and PO intolerance. - Mild AKI: Secondary to hypovolemia, improving. Continue IV fluid hydration. - Replete magnesium, potassium and phosphorus - NPO except ice chips - Plan for CT scan with oral and IV contrast today - Malnutrition and weight loss: PICC placed last night, begin TPN tonight - VTE: lovenox, SCDs - Dispo: inpatient     LOS: 1 day    Sophronia Simas, MD [facility] Surgery General, Hepatobiliary and Pancreatic Surgery 01/14/23 7:57 AM

## 2023-01-14 NOTE — TOC Progression Note (Signed)
Transition of Care [facility]) - Progression Note    Patient Details  Name: Bradley Dominguez MRN: [id number] Date of Birth: 12/09/1955  [facility]) CM/SW Contact  Geni Bers, RN Phone Number: 01/14/2023, 8:57 AM  Clinical Narrative:      Transition of Care (TOC) Screening Note   Patient Details  Name: Bradley Dominguez Date of Birth: October 04, 1956   Transition of Care [facility]) CM/SW Contact:    Geni Bers, RN Phone Number: 01/14/2023, 8:57 AM    Transition of Care [facility]) has reviewed patient and no TOC needs have been identified at this time. We will continue to monitor patient advancement through interdisciplinary progression rounds. If new patient transition needs arise, please place a TOC consult.         Expected Discharge Plan and Services                                               Social Determinants of Health (SDOH) Interventions SDOH Screenings   Food Insecurity: No Food Insecurity (01/13/2023)  Housing: Low Risk  (01/13/2023)  Transportation Needs: No Transportation Needs (01/13/2023)  Utilities: Not At Risk (01/13/2023)  Tobacco Use: Low Risk  (12/27/2022)    Readmission Risk Interventions     No data to display

## 2023-01-14 NOTE — Progress Notes (Signed)
PHARMACY - TOTAL PARENTERAL NUTRITION CONSULT NOTE   Indication: Intolerance to enteral feeding  Patient Measurements: Weight: 68.1 kg (150 lb 3.2 oz)   Body mass index is 19.82 kg/m. Usual Weight:   Assessment: Pt is a 46 yoM admitted on 4/8 with N/V, chronic partial SBO, history of multiple previous abdominal surgeries. Pt has very minimal PO intake with ongoing weight loss. Pharmacy consulted to manage TPN.   Glucose / Insulin: No history of DM. Goal BG 100-180. FBG 104 Electrolytes: Mg (1.6), K (2.9), Phos (2.3) all low. Na WNL. Ca (7.2) low with initial albumin WNL. -Cl elevated, Bicarb low Renal: SCr 1.28 (appears ~baseline), BUN elevated Hepatic: LFTs, Tbili, Alk Phos, Albumin all WNL Intake / Output; MIVF: Strict I/O not charted.  -UOP: 1100 mL + 1 unmeasured. Stool x2. LBM 4/9 -mIVF: D5 1/2 NS w/KCl 20 mEq/L at 100 mL/hr GI Imaging: 4/4 UGI: Findings suggestive of chronic partial small bowel obstruction or ileus GI Surgeries / Procedures:   Central access: Double lumen PICC placed 4/8 TPN start date: 4/9  Nutritional Goals:  Goal TPN rate is 85 mL/hr (provides 112 g of protein and 2101 kcals per day)  RD Assessment: Estimated Needs Total Energy Estimated Needs: 2050-2250 Total Protein Estimated Needs: 100-115g Total Fluid Estimated Needs: 2.1L/day  Current Nutrition:  NPO except for ice chips TPN  Plan:  Now: Magnesium sulfate 4 g IV once KCl 10 mEq x 4 runs Ca gluconate 1 g IV once K Phos 20 mmol IV once (will provide ~30 mEq additional K) Remove K from mIVF. Continue D5 1/2 NS @ 100 mL/hr until 1800   At 1800: Start TPN at 30 mL/hr at 1800 Advance slowly given risk of refeeding syndrome Will provide 41g protein, 108 g dextrose, and 742 kcl Electrolytes in TPN: Standard Na 80mEq/L, K 65mEq/L, Ca 76mEq/L, Mg 13mEq/L, and Phos 57mmol/L. Cl:Ac 1:2 Add standard MVI and trace elements to TPN Thiamine added to TPN Initiate Sensitive q6h SSI and adjust as  needed  Reduce MIVF to NS at 70 mL/hr Monitor TPN labs on Mon/Thurs, recheck electrolytes with AM labs tomorrow.   Cindi Carbon, PharmD 01/14/2023,11:39 AM

## 2023-01-15 DIAGNOSIS — E46 Unspecified protein-calorie malnutrition: Secondary | ICD-10-CM | POA: Diagnosis not present

## 2023-01-15 DIAGNOSIS — N179 Acute kidney failure, unspecified: Secondary | ICD-10-CM

## 2023-01-15 DIAGNOSIS — R634 Abnormal weight loss: Secondary | ICD-10-CM

## 2023-01-15 DIAGNOSIS — E876 Hypokalemia: Secondary | ICD-10-CM | POA: Diagnosis not present

## 2023-01-15 DIAGNOSIS — Z8679 Personal history of other diseases of the circulatory system: Secondary | ICD-10-CM

## 2023-01-15 DIAGNOSIS — R112 Nausea with vomiting, unspecified: Secondary | ICD-10-CM | POA: Diagnosis not present

## 2023-01-15 DIAGNOSIS — Z86010 Personal history of colonic polyps: Secondary | ICD-10-CM

## 2023-01-15 LAB — BASIC METABOLIC PANEL
Anion gap: 4 — ABNORMAL LOW (ref 5–15)
BUN: 24 mg/dL — ABNORMAL HIGH (ref 8–23)
CO2: 16 mmol/L — ABNORMAL LOW (ref 22–32)
Calcium: 6.8 mg/dL — ABNORMAL LOW (ref 8.9–10.3)
Chloride: 113 mmol/L — ABNORMAL HIGH (ref 98–111)
Creatinine, Ser: 1.08 mg/dL (ref 0.61–1.24)
GFR, Estimated: >60 mL/min (ref >60)
Glucose, Bld: 104 mg/dL — ABNORMAL HIGH (ref 70–99)
Potassium: 3.1 mmol/L — ABNORMAL LOW (ref 3.5–5.1)
Sodium: 133 mmol/L — ABNORMAL LOW (ref 135–145)

## 2023-01-15 LAB — GLUCOSE, CAPILLARY
Glucose-Capillary: 102 mg/dL — ABNORMAL HIGH (ref 70–99)
Glucose-Capillary: 104 mg/dL — ABNORMAL HIGH (ref 70–99)
Glucose-Capillary: 107 mg/dL — ABNORMAL HIGH (ref 70–99)
Glucose-Capillary: 112 mg/dL — ABNORMAL HIGH (ref 70–99)

## 2023-01-15 LAB — PHOSPHORUS: Phosphorus: 2.1 mg/dL — ABNORMAL LOW (ref 2.5–4.6)

## 2023-01-15 LAB — MAGNESIUM: Magnesium: 2.2 mg/dL (ref 1.7–2.4)

## 2023-01-15 MED ORDER — ENOXAPARIN SODIUM 40 MG/0.4ML IJ SOSY
40.0000 mg | PREFILLED_SYRINGE | INTRAMUSCULAR | Status: DC
  Administered 2023-01-16 – 2023-01-18 (×3): 40 mg via SUBCUTANEOUS
  Filled 2023-01-15 (×4): qty 0.4

## 2023-01-15 MED ORDER — TRAVASOL 10 % IV SOLN
INTRAVENOUS | Status: AC
  Filled 2023-01-15: qty 528

## 2023-01-15 MED ORDER — CALCIUM GLUCONATE-NACL 1-0.675 GM/50ML-% IV SOLN
1.0000 g | Freq: Once | INTRAVENOUS | Status: AC
  Administered 2023-01-15: 1000 mg via INTRAVENOUS
  Filled 2023-01-15: qty 50

## 2023-01-15 MED ORDER — SODIUM CHLORIDE 0.9 % IV BOLUS
500.0000 mL | Freq: Once | INTRAVENOUS | Status: AC
  Administered 2023-01-15: 500 mL via INTRAVENOUS

## 2023-01-15 MED ORDER — POTASSIUM CHLORIDE 10 MEQ/50ML IV SOLN
10.0000 meq | INTRAVENOUS | Status: AC
  Administered 2023-01-15 (×2): 10 meq via INTRAVENOUS
  Filled 2023-01-15 (×2): qty 50

## 2023-01-15 MED ORDER — POTASSIUM PHOSPHATES 15 MMOLE/5ML IV SOLN
30.0000 mmol | Freq: Once | INTRAVENOUS | Status: AC
  Administered 2023-01-15: 30 mmol via INTRAVENOUS
  Filled 2023-01-15: qty 10

## 2023-01-15 NOTE — Consult Note (Addendum)
  Consultation  Referring Provider: Allen, Shelby L, MD Primary Care Physician:  Tisovec, Richard W, MD Primary Gastroenterologist:  Dr.Perry  Reason for Consultation: Weight loss, nausea and vomiting, abnormal imaging with duodenal dilation, and concerns for partial outlet obstruction.  HPI: Bradley Dominguez is a 67 y.o. male established with Dr. Perry, who has primarily been followed for colonoscopies in the past.  He has been seen in the office in March 2024 by Jennifer Lemmon, PA-C at that time both 3-month history of ongoing problems with nausea and vomiting, abdominal pain and distention postprandially usually relieved by vomiting, and associated 20 pound weight loss.  Decision was made to refer him to surgery as he had had multiple prior abdominal surgeries and it was felt symptoms were consistent with partial obstruction. He had had CT of the abdomen pelvis done in December 2023 that showed clustered small bowel loops and possible high-grade small bowel obstruction due to adhesions could not rule out any internal hernia at that time, most of the small bowel was distended up to 6 cm and there was masslike appearance at the ileocecal valve likely secondary to combination of contrast and stool, also noted some mild intrahepatic ductal dilation. He was seen by Dr. Shelby Allen outpatient in 2 ordered upper GI which was done on 01/09/2023 which showed gastric emptying to be normal however the second portion of the duodenum is distended and largely retained most of the contrast over the 3 hours of the exam. Unfortunately over the past few weeks his symptoms have worsened and he has developed progressive weakness, inability to keep down much of anything and was admitted to the surgery service.  CT of the abdomen pelvis yesterday shows a 6 mm cyst in the left lobe of the liver, and clips at the GE junction, stomach appears normal, there are multiple distended loops of small bowel measuring up to 6 cm but  no definite transition point, there is a small bowel anastomosis in the right lower quadrant, colon is mildly distended with stool.  Findings are consistent with ileus versus small bowel obstruction.  Labs on admission WBC 6.6/hemoglobin 11.9/9 creatinine 4.9 Prealbumin 11 Potassium 2.4/magnesium 0.7 BUN 42/creatinine 1.36 LFT's within normal limits.  Patient and wife state that he had multiple abdominal surgeries initially in his late teens and then in his early 20s.  Initial surgeries were done in the 1970s at [facility] he knows that he is status post appendectomy and cholecystectomy, and had surgery for a bowel obstruction which required removal of 8 feet of what sounds like distal small bowel.  He says after that surgery he was sent home with an external tube which she had to gradually pull out over several weeks which was left in to prevent his bowel from obstructing.  He believes that he had a surgery on his stomach and nose at one point it was mentioned that he may have to have some sort of a bypass procedure because of a congenital issue.  Unfortunately he does not have any of those records as very remote.  It sounds as if he did very well after all of the surgeries up until the past 4 to 5 months when he started having problems with the recurrent postprandial abdominal pain and distention relieved with nausea and vomiting which has gradually progressed over the past 4 months to this point. Has had long history of B12 deficiency, had been on injections for years and then converted to p.o. which had been working   well until he developed the above symptoms.  More comfortable today usually feels fine if he does not eat, TPN has been started  Dr. Allen is concerned with the upper GI findings. Second portion of the duodenum, and is requesting EGD to be sure there is no lesion or stricture in this area contributing to his symptoms.  Has not had prior EGD through our office, last colonoscopy  August 2023 per Dr. Perry with ultraslim scope revealed internal hemorrhoids otherwise normal, no polyps.  Past Medical History:  Diagnosis Date   Arthritis    HANDS   Bowel obstruction (HCC)    CHF (congestive heart failure) (HCC) 11/26/2018   Elevated brain natriuretic peptide (BNP) level 11/25/2018   Family history of adverse reaction to anesthesia    FATHER SLOW TO AWAKEN, MOTHER POST OP N/V   Frequent UTI    History of colon polyps 2009   History of shingles    Plantar fasciitis    Prostate hypertrophy    Vitamin B 12 deficiency     Past Surgical History:  Procedure Laterality Date   APPENDECTOMY     CHOLECYSTECTOMY     COLONSCOPY  AGE 55   WITH POLYP REMOVED   SMALL INTESTINE SURGERY     1979   TRANSURETHRAL RESECTION OF PROSTATE N/A 09/22/2017   Procedure: TRANSURETHRAL RESECTION OF THE PROSTATE (TURP);  Surgeon: McKenzie, Patrick L, MD;  [facility];  Service: Urology;  Laterality: N/A;    Prior to Admission medications   Medication Sig Start Date End Date Taking? Authorizing Provider  acetaminophen (TYLENOL) 500 MG tablet Take 1,000 mg by mouth as needed for moderate pain.   Yes [provider]  aspirin-acetaminophen-caffeine (EXCEDRIN MIGRAINE) 250-250-65 MG tablet Take 1 tablet by mouth as needed for headache or migraine.   Yes [provider]  cyanocobalamin 2000 MCG tablet Take 2,000 mcg by mouth daily.   Yes [provider]  Lactobacillus (FLORAJEN ACIDOPHILUS PO) Take 1 capsule by mouth daily.   Yes [provider]  ondansetron (ZOFRAN-ODT) 8 MG disintegrating tablet Take 8 mg by mouth every 8 (eight) hours as needed for vomiting or nausea. 01/10/23  Yes [provider]  pantoprazole (PROTONIX) 40 MG tablet Take by mouth. Patient not taking: Reported on 01/14/2023 12/09/22 12/09/23  [provider]    Current Facility-Administered Medications  Medication Dose Route Frequency Provider Last  Rate Last Admin   0.9 %  sodium chloride infusion   Intravenous Continuous Swayne, Mary M, RPH 70 mL/hr at 01/14/23 1659 New Bag at 01/14/23 1659   acetaminophen (TYLENOL) tablet 1,000 mg  1,000 mg Oral Q6H PRN Maczis, Michael M, PA-C       Chlorhexidine Gluconate Cloth 2 % PADS 6 each  6 each Topical Daily Allen, Shelby L, MD   6 each at 01/15/23 1052   diphenhydrAMINE (BENADRYL) injection 12.5 mg  12.5 mg Intravenous Q6H PRN Allen, Shelby L, MD       enoxaparin (LOVENOX) injection 40 mg  40 mg Subcutaneous Q24H Allen, Shelby L, MD   40 mg at 01/14/23 1656   insulin aspart (novoLOG) injection 0-9 Units  0-9 Units Subcutaneous Q6H Swayne, Mary M, RPH   1 Units at 01/14/23 1807   morphine (PF) 2 MG/ML injection 2-4 mg  2-4 mg Intravenous Q3H PRN Maczis, Michael M, PA-C       ondansetron (ZOFRAN) injection 4 mg  4 mg Intravenous Q6H PRN Allen, Shelby L, MD         potassium chloride 10 mEq in 50 mL *CENTRAL LINE* IVPB  10 mEq Intravenous Q1 Hr x 2 Legge, Justin M, RPH 50 mL/hr at 01/15/23 1052 10 mEq at 01/15/23 1052   potassium PHOSPHATE 30 mmol in dextrose 5 % 500 mL infusion  30 mmol Intravenous Once Legge, Justin M, RPH       sodium chloride flush (NS) 0.9 % injection 10-40 mL  10-40 mL Intracatheter Q12H Allen, Shelby L, MD       sodium chloride flush (NS) 0.9 % injection 10-40 mL  10-40 mL Intracatheter PRN Allen, Shelby L, MD       TPN ADULT (ION)   Intravenous Continuous TPN Swayne, Mary M, RPH 30 mL/hr at 01/14/23 1724 New Bag at 01/14/23 1724   TPN ADULT (ION)   Intravenous Continuous TPN Legge, Justin M, RPH        Allergies as of 01/13/2023   (No Known Allergies)    Family History  Problem Relation Age of Onset   Breast cancer Mother    Heart attack Father    Diabetes Other    Colon cancer Neg Hx    Esophageal cancer Neg Hx    Stomach cancer Neg Hx    Colon polyps Neg Hx    Rectal cancer Neg Hx     Social History   Socioeconomic History   Marital status: Married    Spouse  name: Not on file   Number of children: 1   Years of education: Not on file   Highest education level: Not on file  Occupational History   Occupation: warehouse maanager  Tobacco Use   Smoking status: Never   Smokeless tobacco: Never  Vaping Use   Vaping Use: Never used  Substance and Sexual Activity   Alcohol use: No    Alcohol/week: 0.0 standard drinks of alcohol   Drug use: No   Sexual activity: Not on file  Other Topics Concern   Not on file  Social History Narrative   Not on file   Social Determinants of Health   Financial Resource Strain: Not on file  Food Insecurity: No Food Insecurity (01/13/2023)   Hunger Vital Sign    Worried About Running Out of Food in the Last Year: Never true    Ran Out of Food in the Last Year: Never true  Transportation Needs: No Transportation Needs (01/13/2023)   PRAPARE - Transportation    Lack of Transportation (Medical): No    Lack of Transportation (Non-Medical): No  Physical Activity: Not on file  Stress: Not on file  Social Connections: Not on file  Intimate Partner Violence: Not At Risk (01/13/2023)   Humiliation, Afraid, Rape, and Kick questionnaire    Fear of Current or Ex-Partner: No    Emotionally Abused: No    Physically Abused: No    Sexually Abused: No    Review of Systems: Pertinent positive and negative review of systems were noted in the above HPI section.  All other review of systems was otherwise negative.   Physical Exam: Vital signs in last 24 hours: Temp:  [97.4 F (36.3 C)-97.8 F (36.6 C)] 97.8 F (36.6 C) (04/10 0601) Pulse Rate:  [55-62] 62 (04/10 0601) Resp:  [14-18] 18 (04/10 0601) BP: (93-99)/(64-70) 93/64 (04/10 0601) SpO2:  [99 %-100 %] 100 % (04/10 0601) Last BM Date : 01/15/23 General:   Alert,  Well-developed, thin, older white male, pleasant and cooperative in NAD Head:  Normocephalic and atraumatic. Eyes:  Sclera clear, no   icterus.   Conjunctiva pink. Ears:  Normal auditory acuity. Nose:  No  deformity, discharge,  or lesions. Mouth:  No deformity or lesions.   Neck:  Supple; no masses or thyromegaly. Lungs:  Clear throughout to auscultation.   No wheezes, crackles, or rhonchi.  Heart:  Regular rate and rhythm; no murmurs, clicks, rubs,  or gallops. Abdomen:  Soft,nontender, long midline incisional scar, additional smaller abdominal scars , distended and nontender at present palpable mass or hepatosplenomegaly Rectal: not done Msk:  Symmetrical without gross deformities. . Pulses:  Normal pulses noted. Extremities:  Without clubbing or edema. Neurologic:  Alert and  oriented x4;  grossly normal neurologically. Skin:  Intact without significant lesions or rashes.. Psych:  Alert and cooperative. Normal mood and affect.  Intake/Output from previous day: 04/09 0701 - 04/10 0700 In: 2699.8 [I.V.:2167.6; IV Piggyback:532.2] Out: 400 [Urine:400] Intake/Output this shift: Total I/O In: -  Out: 650 [Urine:650]  Lab Results: Recent Labs    01/13/23 1522  WBC 6.6  HGB 11.9*  HCT 34.9*  PLT 156   BMET Recent Labs    01/13/23 1522 01/14/23 0406 01/15/23 0433  NA 140 139 133*  K 2.4* 2.9* 3.1*  CL 114* 115* 113*  CO2 18* 17* 16*  GLUCOSE 87 104* 104*  BUN 42* 35* 24*  CREATININE 1.36* 1.28* 1.08  CALCIUM 7.1* 7.0* 6.8*   LFT Recent Labs    01/13/23 1522  PROT 5.9*  ALBUMIN 3.7  AST 23  ALT 26  ALKPHOS 59  BILITOT 1.0   PT/INR No results for input(s): "LABPROT", "INR" in the last 72 hours. Hepatitis Panel No results for input(s): "HEPBSAG", "HCVAB", "HEPAIGM", "HEPBIGM" in the last 72 hours.    IMPRESSION:  #1 66-year-old white male, status post numerous abdominal surgeries in his early 20s including appendectomy, cholecystectomy, history of small bowel obstruction requiring bowel resection of what sounds like 8 feet of distal small bowel, and prior gastric surgery type unknown. Now with 4-5 month history of recurrent abdominal pain and obvious  distention postprandially generally relieved with vomiting.  Symptoms have progressed to the point of daily symptoms, daily nausea and vomiting, poor p.o. intake and subsequent 25 pound weight loss.  Imaging in December and again on 01/13/2023 shows what appears to be the entire small bowel distended up to 6 cm without any definite transition point raising the question of high-grade small bowel obstruction versus ileus.  The imaging in December raised question of an internal hernia, and also mentioned abnormal appearance of the ileocecal valve felt to be a combination of contrast and retained stool  Suspect he may have a component of chronically dilated small bowel.  Upper GI last week showed normal gastric emptying but retention of the majority of the contrast over 3 hours of the study in the area of the second portion of the duodenum-raising concern for possible stenosis/stricture/lesion in this area contributing to his symptoms.  #2 malnutrition secondary to above #3 hypokalemia and hypomagnesemia secondary to above correcting #4 acute kidney injury secondary above improving #5 history of congestive heart failure #6 history of adenomatous colon polyps-up-to-date with colonoscopy last done August 2023 no polyps, ultraslim scope used   PLAN: Discussion with the patient and wife, reviewed findings today We will plan for EGD with Dr. Johni Narine tomorrow.  Procedure discussed in detail with patient including indications risk and benefits and he is agreeable to proceed Continue potassium replacement  Further recommendations pending findings at EGD   Amy Esterwood PA-C   01/15/2023, 11:26 AM   Attending physician's note   I have taken history, reviewed the chart and examined the patient. I performed a substantive portion of this encounter, including complete performance of at least one of the key components, in conjunction with the APP. I agree with the Advanced Practitioner's note, impression and  recommendations.   Recurrent postprandial Abdo pain, N/V with (>25lb)wt loss/dehydration. CTAP shows significantly distended SB loops- likely PSBO (H/O multiple abdo surgeries). UGI series 4/4 with contrast retention D2-raising concern for possible stenosis/lesion.  Plan: -Agree with Dr Allen to proceed with enteroscopy in AM -IV Protonix -TPN -CT/SB series reviewed. -D/W sister and wife at bedside.   Raj Aram Domzalski, MD [facility] [phone]   

## 2023-01-15 NOTE — Progress Notes (Signed)
Subjective: No acute changes. Denies any nausea, vomiting and bloating but has not had any PO intake. Having 3-4 loose stools per day. Hypokalemia slowly improving.   Objective: Vital signs in last 24 hours: Temp:  [97.4 F (36.3 C)-97.8 F (36.6 C)] 97.8 F (36.6 C) (04/10 0601) Pulse Rate:  [55-98] 62 (04/10 0601) Resp:  [14-18] 18 (04/10 0601) BP: (92-99)/(61-70) 93/64 (04/10 0601) SpO2:  [95 %-100 %] 100 % (04/10 0601) Last BM Date : 01/15/23  Intake/Output from previous day: 04/09 0701 - 04/10 0700 In: 2699.8 [I.V.:2167.6; IV Piggyback:532.2] Out: 400 [Urine:400] Intake/Output this shift: No intake/output data recorded.  PE: General: resting comfortably, NAD Neuro: alert and oriented, no focal deficits Resp: normal work of breathing on room air Abdomen: soft, nondistended, nontender to palpation. Midline laparotomy scar. Extremities: warm and well-perfused   Lab Results:  Recent Labs    01/13/23 1522  WBC 6.6  HGB 11.9*  HCT 34.9*  PLT 156   BMET Recent Labs    01/14/23 0406 01/15/23 0433  NA 139 133*  K 2.9* 3.1*  CL 115* 113*  CO2 17* 16*  GLUCOSE 104* 104*  BUN 35* 24*  CREATININE 1.28* 1.08  CALCIUM 7.0* 6.8*   PT/INR No results for input(s): "LABPROT", "INR" in the last 72 hours. CMP     Component Value Date/Time   NA 133 (L) 01/15/2023 0433   K 3.1 (L) 01/15/2023 0433   CL 113 (H) 01/15/2023 0433   CO2 16 (L) 01/15/2023 0433   GLUCOSE 104 (H) 01/15/2023 0433   BUN 24 (H) 01/15/2023 0433   CREATININE 1.08 01/15/2023 0433   CALCIUM 6.8 (L) 01/15/2023 0433   PROT 5.9 (L) 01/13/2023 1522   ALBUMIN 3.7 01/13/2023 1522   AST 23 01/13/2023 1522   ALT 26 01/13/2023 1522   ALKPHOS 59 01/13/2023 1522   BILITOT 1.0 01/13/2023 1522   GFRNONAA >60 01/15/2023 0433   GFRAA >60 09/23/2017 0507   Lipase  No results found for: "LIPASE"     Studies/Results: CT ABDOMEN PELVIS W CONTRAST  Result Date: 01/15/2023 CLINICAL DATA:   Abdominal distension, suspect partial obstruction. EXAM: CT ABDOMEN AND PELVIS WITH CONTRAST TECHNIQUE: Multidetector CT imaging of the abdomen and pelvis was performed using the standard protocol following bolus administration of intravenous contrast. RADIATION DOSE REDUCTION: This exam was performed according to the departmental dose-optimization program which includes automated exposure control, adjustment of the mA and/or kV according to patient size and/or use of iterative reconstruction technique. CONTRAST:  OMNIPAQUE IOHEXOL 300 MG/ML  SOLN COMPARISON:  09/16/2022. FINDINGS: Lower chest: Stable subpleural nodules are noted bilaterally unchanged from 2018 and are likely benign. The lung bases are otherwise clear. Hepatobiliary: 6 mm hypodensity is present in the left lobe of the liver, most likely cyst or hemangioma. Fatty infiltration of the liver is noted. No biliary ductal dilatation. The gallbladder is not visualized on exam. Pancreas: Unremarkable. No pancreatic ductal dilatation or surrounding inflammatory changes. Spleen: Normal in size without focal abnormality. Adrenals/Urinary Tract: The adrenal glands are within normal limits. The kidneys enhance symmetrically. No renal calculus or hydronephrosis. The bladder is unremarkable. Stomach/Bowel: Surgical clips are present at the gastroesophageal junction. Stomach is within normal limits. There are multiple distended loops of fluid-filled small bowel in the abdomen measuring up to 6.0 cm. No definite transition point is seen. Paucity of intra-abdominal fat limits evaluation of individual bowel loops. The visualized colon is mildly distended with stool. An anastomotic  site is seen in the small bowel in the right lower quadrant. No definite free air or pneumatosis. The appendix is not visualized on exam. Vascular/Lymphatic: No significant vascular findings are present. No enlarged abdominal or pelvic lymph nodes. Reproductive: Prostate is  unremarkable. Other: No abdominopelvic ascites. Musculoskeletal: No acute osseous abnormality. IMPRESSION: 1. Multiple distended loops of fluid-filled small bowel measuring up to 6.0 cm with no definite transition point, not significantly changed from the prior exam. Examination of the small bowel is limited due to lack of oral contrast and paucity of intra-abdominal fat findings may represent ileus versus high-grade partial small bowel obstruction. Surgical consultation is recommended. 2. Hepatic steatosis. 3. Aortic atherosclerosis. Electronically Signed   By: Thornell Sartorius M.D.   On: 01/15/2023 01:33   [place] EKG SITE RITE  Result Date: 01/13/2023 If Site Rite image not attached, placement could not be confirmed due to current cardiac rhythm.   Anti-infectives: Anti-infectives (From admission, onward)    None        Assessment/Plan 67 yo male with chronic intermittent bloating, presenting with worsening symptoms, PO intolerance and dehydration. - Mild AKI: Secondary to hypovolemia, resolved. - Continue electrolyte repletion. - CT completed yesterday, shows stable diffuse small bowel dilation without a distinct transition zone. The proximal duodenum appears dilated in the RUQ, with tapering at D3/D4, which is new compared to prior CT and is also consistent with upper GI findings last week. Patient continues to have regular bowel function and is not distended. Clinical presentation is not consistent with a distal small bowel obstruction. Symptoms of postprandial bloating and vomiting are more consistent with a foregut issue, as are his imaging findings. Will consult GI today to request an EGD to evaluate for duodenal stricture, mass, etc. Patient may ultimately require surgical intervention but needs further workup first. - NPO except ice chips - Malnutrition and weight loss: Continue TPN - VTE: lovenox, SCDs - Dispo: inpatient     LOS: 2 days    Sophronia Simas, MD [facility]  Surgery General, Hepatobiliary and Pancreatic Surgery 01/15/23 9:59 AM

## 2023-01-15 NOTE — H&P (View-Only) (Signed)
Consultation  Referring Provider: Fritzi Mandes, MD Primary Care Physician:  Gaspar Garbe, MD Primary Gastroenterologist:  Dr.Perry  Reason for Consultation: Weight loss, nausea and vomiting, abnormal imaging with duodenal dilation, and concerns for partial outlet obstruction.  HPI: Bradley Dominguez is a 67 y.o. male established with Dr. Marina Goodell, who has primarily been followed for colonoscopies in the past.  He has been seen in the office in March 2024 by Hyacinth Meeker, PA-C at that time both 51-month history of ongoing problems with nausea and vomiting, abdominal pain and distention postprandially usually relieved by vomiting, and associated 20 pound weight loss.  Decision was made to refer him to surgery as he had had multiple prior abdominal surgeries and it was felt symptoms were consistent with partial obstruction. He had had CT of the abdomen pelvis done in December 2023 that showed clustered small bowel loops and possible high-grade small bowel obstruction due to adhesions could not rule out any internal hernia at that time, most of the small bowel was distended up to 6 cm and there was masslike appearance at the ileocecal valve likely secondary to combination of contrast and stool, also noted some mild intrahepatic ductal dilation. He was seen by Dr. Sophronia Simas outpatient in 2 ordered upper GI which was done on 01/09/2023 which showed gastric emptying to be normal however the second portion of the duodenum is distended and largely retained most of the contrast over the 3 hours of the exam. Unfortunately over the past few weeks his symptoms have worsened and he has developed progressive weakness, inability to keep down much of anything and was admitted to the surgery service.  CT of the abdomen pelvis yesterday shows a 6 mm cyst in the left lobe of the liver, and clips at the GE junction, stomach appears normal, there are multiple distended loops of small bowel measuring up to 6 cm but  no definite transition point, there is a small bowel anastomosis in the right lower quadrant, colon is mildly distended with stool.  Findings are consistent with ileus versus small bowel obstruction.  Labs on admission WBC 6.6/hemoglobin 11.9/9 creatinine 4.9 Prealbumin 11 Potassium 2.4/magnesium 0.7 BUN 42/creatinine 1.36 LFT's within normal limits.  Patient and wife state that he had multiple abdominal surgeries initially in his late teens and then in his early 72s.  Initial surgeries were done in the 1970s at [facility] he knows that he is status post appendectomy and cholecystectomy, and had surgery for a bowel obstruction which required removal of 8 feet of what sounds like distal small bowel.  He says after that surgery he was sent home with an external tube which she had to gradually pull out over several weeks which was left in to prevent his bowel from obstructing.  He believes that he had a surgery on his stomach and nose at one point it was mentioned that he may have to have some sort of a bypass procedure because of a congenital issue.  Unfortunately he does not have any of those records as very remote.  It sounds as if he did very well after all of the surgeries up until the past 4 to 5 months when he started having problems with the recurrent postprandial abdominal pain and distention relieved with nausea and vomiting which has gradually progressed over the past 4 months to this point. Has had long history of B12 deficiency, had been on injections for years and then converted to p.o. which had been working  well until he developed the above symptoms.  More comfortable today usually feels fine if he does not eat, TPN has been started  Dr. Freida BusmanAllen is concerned with the upper GI findings. Second portion of the duodenum, and is requesting EGD to be sure there is no lesion or stricture in this area contributing to his symptoms.  Has not had prior EGD through our office, last colonoscopy  August 2023 per Dr. Marina GoodellPerry with ultraslim scope revealed internal hemorrhoids otherwise normal, no polyps.  Past Medical History:  Diagnosis Date   Arthritis    HANDS   Bowel obstruction (HCC)    CHF (congestive heart failure) (HCC) 11/26/2018   Elevated brain natriuretic peptide (BNP) level 11/25/2018   Family history of adverse reaction to anesthesia    FATHER SLOW TO AWAKEN, MOTHER POST OP N/V   Frequent UTI    History of colon polyps 2009   History of shingles    Plantar fasciitis    Prostate hypertrophy    Vitamin B 12 deficiency     Past Surgical History:  Procedure Laterality Date   APPENDECTOMY     CHOLECYSTECTOMY     COLONSCOPY  AGE 2   WITH POLYP REMOVED   SMALL INTESTINE SURGERY     1979   TRANSURETHRAL RESECTION OF PROSTATE N/A 09/22/2017   Procedure: TRANSURETHRAL RESECTION OF THE PROSTATE (TURP);  Surgeon: Malen GauzeMcKenzie, Patrick L, MD;  [facility] [place];  Service: Urology;  Laterality: N/A;    Prior to Admission medications   Medication Sig Start Date End Date Taking? Authorizing Provider  acetaminophen (TYLENOL) 500 MG tablet Take 1,000 mg by mouth as needed for moderate pain.   Yes [provider]  aspirin-acetaminophen-caffeine (EXCEDRIN MIGRAINE) [id number] MG tablet Take 1 tablet by mouth as needed for headache or migraine.   Yes [provider]  cyanocobalamin 2000 MCG tablet Take 2,000 mcg by mouth daily.   Yes [provider]  Lactobacillus (FLORAJEN ACIDOPHILUS PO) Take 1 capsule by mouth daily.   Yes [provider]  ondansetron (ZOFRAN-ODT) 8 MG disintegrating tablet Take 8 mg by mouth every 8 (eight) hours as needed for vomiting or nausea. 01/10/23  Yes [provider]  pantoprazole (PROTONIX) 40 MG tablet Take by mouth. Patient not taking: Reported on 01/14/2023 12/09/22 12/09/23  [provider]    Current Facility-Administered Medications  Medication Dose Route Frequency Provider Last  Rate Last Admin   0.9 %  sodium chloride infusion   Intravenous Continuous Cindi CarbonSwayne, Mary M, RPH 70 mL/hr at 01/14/23 1659 New Bag at 01/14/23 1659   acetaminophen (TYLENOL) tablet 1,000 mg  1,000 mg Oral Q6H PRN Maczis, Elmer SowMichael M, PA-C       Chlorhexidine Gluconate Cloth 2 % PADS 6 each  6 each Topical Daily Fritzi MandesAllen, Shelby L, MD   6 each at 01/15/23 1052   diphenhydrAMINE (BENADRYL) injection 12.5 mg  12.5 mg Intravenous Q6H PRN Fritzi MandesAllen, Shelby L, MD       enoxaparin (LOVENOX) injection 40 mg  40 mg Subcutaneous Q24H Sophronia SimasAllen, Shelby L, MD   40 mg at 01/14/23 1656   insulin aspart (novoLOG) injection 0-9 Units  0-9 Units Subcutaneous Q6H Cindi CarbonSwayne, Mary M, [facility]   1 Units at 01/14/23 1807   morphine (PF) 2 MG/ML injection 2-4 mg  2-4 mg Intravenous Q3H PRN Jacinto HalimMaczis, Michael M, PA-C       ondansetron [facility](ZOFRAN) injection 4 mg  4 mg Intravenous Q6H PRN Fritzi MandesAllen, Shelby L, MD  potassium chloride 10 mEq in 50 mL *CENTRAL LINE* IVPB  10 mEq Intravenous Q1 Hr x 2 Hessie Knows, RPH 50 mL/hr at 01/15/23 1052 10 mEq at 01/15/23 1052   potassium PHOSPHATE 30 mmol in dextrose 5 % 500 mL infusion  30 mmol Intravenous Once Hessie Knows, [facility]       sodium chloride flush (NS) 0.9 % injection 10-40 mL  10-40 mL Intracatheter Q12H Sophronia Simas L, MD       sodium chloride flush (NS) 0.9 % injection 10-40 mL  10-40 mL Intracatheter PRN Fritzi Mandes, MD       TPN ADULT (ION)   Intravenous Continuous TPN Cindi Carbon, RPH 30 mL/hr at 01/14/23 1724 New Bag at 01/14/23 1724   TPN ADULT (ION)   Intravenous Continuous TPN Hessie Knows, [facility]        Allergies as of 01/13/2023   (No Known Allergies)    Family History  Problem Relation Age of Onset   Breast cancer Mother    Heart attack Father    Diabetes Other    Colon cancer Neg Hx    Esophageal cancer Neg Hx    Stomach cancer Neg Hx    Colon polyps Neg Hx    Rectal cancer Neg Hx     Social History   Socioeconomic History   Marital status: Married    Spouse  name: Not on file   Number of children: 1   Years of education: Not on file   Highest education level: Not on file  Occupational History   Occupation: Teacher, music  Tobacco Use   Smoking status: Never   Smokeless tobacco: Never  Vaping Use   Vaping Use: Never used  Substance and Sexual Activity   Alcohol use: No    Alcohol/week: 0.0 standard drinks of alcohol   Drug use: No   Sexual activity: Not on file  Other Topics Concern   Not on file  Social History Narrative   Not on file   Social Determinants of Health   Financial Resource Strain: Not on file  Food Insecurity: No Food Insecurity (01/13/2023)   Hunger Vital Sign    Worried About Running Out of Food in the Last Year: Never true    Ran Out of Food in the Last Year: Never true  Transportation Needs: No Transportation Needs (01/13/2023)   PRAPARE - Administrator, Civil Service (Medical): No    Lack of Transportation (Non-Medical): No  Physical Activity: Not on file  Stress: Not on file  Social Connections: Not on file  Intimate Partner Violence: Not At Risk (01/13/2023)   Humiliation, Afraid, Rape, and Kick questionnaire    Fear of Current or Ex-Partner: No    Emotionally Abused: No    Physically Abused: No    Sexually Abused: No    Review of Systems: Pertinent positive and negative review of systems were noted in the above HPI section.  All other review of systems was otherwise negative.   Physical Exam: Vital signs in last 24 hours: Temp:  [97.4 F (36.3 C)-97.8 F (36.6 C)] 97.8 F (36.6 C) (04/10 0601) Pulse Rate:  [55-62] 62 (04/10 0601) Resp:  [14-18] 18 (04/10 0601) BP: (93-99)/(64-70) 93/64 (04/10 0601) SpO2:  [99 %-100 %] 100 % (04/10 0601) Last BM Date : 01/15/23 General:   Alert,  Well-developed, thin, older white male, pleasant and cooperative in NAD Head:  Normocephalic and atraumatic. Eyes:  Sclera clear, no  icterus.   Conjunctiva pink. Ears:  Normal auditory acuity. Nose:  No  deformity, discharge,  or lesions. Mouth:  No deformity or lesions.   Neck:  Supple; no masses or thyromegaly. Lungs:  Clear throughout to auscultation.   No wheezes, crackles, or rhonchi.  Heart:  Regular rate and rhythm; no murmurs, clicks, rubs,  or gallops. Abdomen:  Soft,nontender, long midline incisional scar, additional smaller abdominal scars , distended and nontender at present palpable mass or hepatosplenomegaly Rectal: not done Msk:  Symmetrical without gross deformities. . Pulses:  Normal pulses noted. Extremities:  Without clubbing or edema. Neurologic:  Alert and  oriented x4;  grossly normal neurologically. Skin:  Intact without significant lesions or rashes.. Psych:  Alert and cooperative. Normal mood and affect.  Intake/Output from previous day: 04/09 0701 - 04/10 0700 In: 2699.8 [I.V.:2167.6; IV Piggyback:532.2] Out: 400 [Urine:400] Intake/Output this shift: Total I/O In: -  Out: 650 [Urine:650]  Lab Results: Recent Labs    01/13/23 1522  WBC 6.6  HGB 11.9*  HCT 34.9*  PLT 156   BMET Recent Labs    01/13/23 1522 01/14/23 0406 01/15/23 0433  NA 140 139 133*  K 2.4* 2.9* 3.1*  CL 114* 115* 113*  CO2 18* 17* 16*  GLUCOSE 87 104* 104*  BUN 42* 35* 24*  CREATININE 1.36* 1.28* 1.08  CALCIUM 7.1* 7.0* 6.8*   LFT Recent Labs    01/13/23 1522  PROT 5.9*  ALBUMIN 3.7  AST 23  ALT 26  ALKPHOS 59  BILITOT 1.0   PT/INR No results for input(s): "LABPROT", "INR" in the last 72 hours. Hepatitis Panel No results for input(s): "HEPBSAG", "HCVAB", "HEPAIGM", "HEPBIGM" in the last 72 hours.    IMPRESSION:  #60 67 year old white male, status post numerous abdominal surgeries in his early 25s including appendectomy, cholecystectomy, history of small bowel obstruction requiring bowel resection of what sounds like 8 feet of distal small bowel, and prior gastric surgery type unknown. Now with 4-5 month history of recurrent abdominal pain and obvious  distention postprandially generally relieved with vomiting.  Symptoms have progressed to the point of daily symptoms, daily nausea and vomiting, poor p.o. intake and subsequent 25 pound weight loss.  Imaging in December and again on 01/13/2023 shows what appears to be the entire small bowel distended up to 6 cm without any definite transition point raising the question of high-grade small bowel obstruction versus ileus.  The imaging in December raised question of an internal hernia, and also mentioned abnormal appearance of the ileocecal valve felt to be a combination of contrast and retained stool  Suspect he may have a component of chronically dilated small bowel.  Upper GI last week showed normal gastric emptying but retention of the majority of the contrast over 3 hours of the study in the area of the second portion of the duodenum-raising concern for possible stenosis/stricture/lesion in this area contributing to his symptoms.  #2 malnutrition secondary to above #3 hypokalemia and hypomagnesemia secondary to above correcting #4 acute kidney injury secondary above improving #5 history of congestive heart failure #6 history of adenomatous colon polyps-up-to-date with colonoscopy last done August 2023 no polyps, ultraslim scope used   PLAN: Discussion with the patient and wife, reviewed findings today We will plan for EGD with Dr. Chales Abrahams tomorrow.  Procedure discussed in detail with patient including indications risk and benefits and he is agreeable to proceed Continue potassium replacement  Further recommendations pending findings at EGD   Amy Esterwood PA-C  01/15/2023, 11:26 AM   Attending physician's note   I have taken history, reviewed the chart and examined the patient. I performed a substantive portion of this encounter, including complete performance of at least one of the key components, in conjunction with the APP. I agree with the Advanced Practitioner's note, impression and  recommendations.   Recurrent postprandial Abdo pain, N/V with (>25lb)wt loss/dehydration. CTAP shows significantly distended SB loops- likely PSBO (H/O multiple abdo surgeries). UGI series 4/4 with contrast retention D2-raising concern for possible stenosis/lesion.  Plan: -Agree with Dr Freida Busman to proceed with enteroscopy in AM -IV Protonix -TPN -CT/SB series reviewed. -D/W sister and wife at bedside.   Edman Circle, MD [facility] [phone]

## 2023-01-15 NOTE — Progress Notes (Signed)
67 y.o. male who presented with worsening nausea, vomiting, bloating and weakness. Pt has hx of abdominal surgeries. Most recent vital signs are Temp 97.8, HR 69, BP 97/62, RR 20, O2 98%, POC glucose 104.  Pt is alert and oriented x4, cardiac and pulmonary sounds are normal. Bowel sounds are hypoactive, pt's abdomen is soft to the touch with no pain. Skin is dry and intact. Pt has a PICC line on the R upper arm and peripheral IV on the L arm. Pt has been placed on TPN due to PO intolerance. TPN is going through PICC line. Pt's pain is 0 out of a 0-10 scale.

## 2023-01-15 NOTE — Progress Notes (Signed)
Mobility Specialist - Progress Note   01/15/23 0955  Mobility  Activity Ambulated independently in hallway;Ambulated with assistance in hallway  Level of Assistance Independent after set-up  Assistive Device Other (Comment) (IV Pole)  Distance Ambulated (ft) 500 ft  Activity Response Tolerated well  Mobility Referral Yes  $Mobility charge 1 Mobility   Pt received in bed and agreeable to mobility. Prior to ambulating pt requested to use bathroom. No complaints during session. Pt to bed after session with all needs met & wife in room.    [facility]

## 2023-01-15 NOTE — Progress Notes (Signed)
PHARMACY - TOTAL PARENTERAL NUTRITION CONSULT NOTE   Indication: Intolerance to enteral feeding  Patient Measurements: Weight: 68.1 kg (150 lb 3.2 oz)   Body mass index is 19.82 kg/m. Usual Weight:   Assessment: Pt is a 28 yoM admitted on 4/8 with N/V, chronic partial SBO, history of multiple previous abdominal surgeries. Pt has very minimal PO intake with ongoing weight loss. Pharmacy consulted to manage TPN.   Glucose / Insulin: No history of DM. CBGs at goal 102-139 Electrolytes: Mg (2.2) at goal, K (3.1) low, Phos (2.1) low. Na 133 down. Corrected Ca (7.04) low with initial albumin WNL. -Cl elevated, Bicarb low Renal: SCr 1.08 (appears ~baseline), BUN elevated but improved Hepatic: LFTs, Tbili, Alk Phos, Albumin all WNL Intake / Output; MIVF: Strict I/O not charted.  -UOP: 1100 mL + 1 unmeasured. Stool x2. LBM 4/9 -mIVF: D5 1/2 NS w/KCl 20 mEq/L at 100 mL/hr GI Imaging: 4/4 UGI: Findings suggestive of chronic partial small bowel obstruction or ileus GI Surgeries / Procedures:   Central access: Double lumen PICC placed 4/8 TPN start date: 4/9  Nutritional Goals:  Goal TPN rate is 85 mL/hr (provides 112 g of protein and 2101 kcals per day)  RD Assessment: Estimated Needs Total Energy Estimated Needs: 2050-2250 Total Protein Estimated Needs: 100-115g Total Fluid Estimated Needs: 2.1L/day  Current Nutrition:  NPO except for ice chips TPN  Plan:  Now: Ca gluconate 1 g IV once KCl IV K Phos 30 mmol IV once (will provide ~40 mEq additional K to give about K total outside of TPN)   At 1800: Increase TPN from 30 mL/hr to 40 ml/hr at 1800 Advance slowly given risk of refeeding syndrome Will provide 41g protein, 108 g dextrose, and 742 kcl Electrolytes in TPN: Na 75 mEq/L, K 12mEq/L, Ca 38mEq/L, Mg 8mEq/L, and Phos 8mmol/L. AX:[id number] 1:2 Add standard MVI and trace elements to TPN Thiamine added to TPN Continue Sensitive q6h SSI and adjust as needed   Continue MIVF NS at 70 mL/hr Monitor TPN labs on Mon/Thurs, recheck electrolytes with AM labs tomorrow.     Hessie Knows, PharmD, BCPS Secure Chat if ?s 01/15/2023 8:01 AM

## 2023-01-16 ENCOUNTER — Inpatient Hospital Stay (HOSPITAL_COMMUNITY): Payer: BC Managed Care – PPO | Admitting: Certified Registered Nurse Anesthetist

## 2023-01-16 ENCOUNTER — Encounter (HOSPITAL_COMMUNITY): Payer: Self-pay | Admitting: Surgery

## 2023-01-16 ENCOUNTER — Encounter (HOSPITAL_COMMUNITY): Admission: AD | Disposition: E | Payer: Self-pay | Source: Ambulatory Visit

## 2023-01-16 DIAGNOSIS — K2970 Gastritis, unspecified, without bleeding: Secondary | ICD-10-CM | POA: Diagnosis not present

## 2023-01-16 DIAGNOSIS — Z980 Intestinal bypass and anastomosis status: Secondary | ICD-10-CM | POA: Diagnosis not present

## 2023-01-16 DIAGNOSIS — R1115 Cyclical vomiting syndrome unrelated to migraine: Secondary | ICD-10-CM

## 2023-01-16 HISTORY — PX: ESOPHAGOGASTRODUODENOSCOPY (EGD) WITH PROPOFOL: SHX5813

## 2023-01-16 HISTORY — PX: BIOPSY: SHX5522

## 2023-01-16 LAB — COMPREHENSIVE METABOLIC PANEL
ALT: 24 U/L (ref 0–44)
AST: 19 U/L (ref 15–41)
Albumin: 3.5 g/dL (ref 3.5–5.0)
Alkaline Phosphatase: 60 U/L (ref 38–126)
Anion gap: 5 (ref 5–15)
BUN: 20 mg/dL (ref 8–23)
CO2: 17 mmol/L — ABNORMAL LOW (ref 22–32)
Calcium: 7.5 mg/dL — ABNORMAL LOW (ref 8.9–10.3)
Chloride: 114 mmol/L — ABNORMAL HIGH (ref 98–111)
Creatinine, Ser: 0.99 mg/dL (ref 0.61–1.24)
GFR, Estimated: >60 mL/min (ref >60)
Glucose, Bld: 95 mg/dL (ref 70–99)
Potassium: 3.7 mmol/L (ref 3.5–5.1)
Sodium: 136 mmol/L (ref 135–145)
Total Bilirubin: 0.9 mg/dL (ref 0.3–1.2)
Total Protein: 5.6 g/dL — ABNORMAL LOW (ref 6.5–8.1)

## 2023-01-16 LAB — GLUCOSE, CAPILLARY
Glucose-Capillary: 95 mg/dL (ref 70–99)
Glucose-Capillary: 69 mg/dL — ABNORMAL LOW (ref 70–99)
Glucose-Capillary: 86 mg/dL (ref 70–99)

## 2023-01-16 LAB — PHOSPHORUS: Phosphorus: 1.7 mg/dL — ABNORMAL LOW (ref 2.5–4.6)

## 2023-01-16 LAB — MAGNESIUM: Magnesium: 1.8 mg/dL (ref 1.7–2.4)

## 2023-01-16 LAB — TRIGLYCERIDES: Triglycerides: 30 mg/dL (ref <150)

## 2023-01-16 SURGERY — ESOPHAGOGASTRODUODENOSCOPY (EGD) WITH PROPOFOL
Anesthesia: Monitor Anesthesia Care

## 2023-01-16 MED ORDER — POTASSIUM PHOSPHATES 15 MMOLE/5ML IV SOLN
30.0000 mmol | Freq: Once | INTRAVENOUS | Status: AC
  Administered 2023-01-16 (×2): 30 mmol via INTRAVENOUS
  Filled 2023-01-16: qty 10

## 2023-01-16 MED ORDER — PROPOFOL 500 MG/50ML IV EMUL
INTRAVENOUS | Status: DC | PRN
  Administered 2023-01-16: 150 ug/kg/min via INTRAVENOUS

## 2023-01-16 MED ORDER — MAGNESIUM SULFATE 2 GM/50ML IV SOLN
2.0000 g | Freq: Once | INTRAVENOUS | Status: AC
  Administered 2023-01-16: 2 g via INTRAVENOUS
  Filled 2023-01-16: qty 50

## 2023-01-16 MED ORDER — PROPOFOL 10 MG/ML IV BOLUS
INTRAVENOUS | Status: DC | PRN
  Administered 2023-01-16: 30 mg via INTRAVENOUS

## 2023-01-16 MED ORDER — PHENYLEPHRINE 80 MCG/ML (10ML) SYRINGE FOR IV PUSH (FOR BLOOD PRESSURE SUPPORT)
PREFILLED_SYRINGE | INTRAVENOUS | Status: DC | PRN
  Administered 2023-01-16 (×3): 120 ug via INTRAVENOUS

## 2023-01-16 MED ORDER — TRAVASOL 10 % IV SOLN
INTRAVENOUS | Status: AC
  Filled 2023-01-16: qty 792

## 2023-01-16 MED ORDER — SODIUM CHLORIDE 0.9 % IV SOLN: INTRAVENOUS | Status: AC

## 2023-01-16 MED ORDER — LACTATED RINGERS IV SOLN: INTRAVENOUS | Status: DC | PRN

## 2023-01-16 SURGICAL SUPPLY — 15 items

## 2023-01-16 NOTE — Anesthesia Preprocedure Evaluation (Signed)
Anesthesia Evaluation  Patient identified by MRN, date of birth, ID band Patient awake    Reviewed: Allergy & Precautions, NPO status , Patient's Chart, lab work & pertinent test results  History of Anesthesia Complications (+) Family history of anesthesia reaction  Airway Mallampati: II  TM Distance: >3 FB Neck ROM: Full    Dental  (+) Teeth Intact, Dental Advisory Given   Pulmonary neg pulmonary ROS   Pulmonary exam normal breath sounds clear to auscultation       Cardiovascular +CHF  Normal cardiovascular exam Rhythm:Regular Rate:Normal     Neuro/Psych negative neurological ROS     GI/Hepatic Neg liver ROS,GERD  Medicated,,Rule out partial duodenal obstruction   Endo/Other  negative endocrine ROS    Renal/GU negative Renal ROS     Musculoskeletal  (+) Arthritis ,    Abdominal   Peds  Hematology negative hematology ROS (+)   Anesthesia Other Findings Day of surgery medications reviewed with the patient.  Reproductive/Obstetrics                              Anesthesia Physical Anesthesia Plan  ASA: 3  Anesthesia Plan: MAC   Post-op Pain Management:    Induction: Intravenous  PONV Risk Score and Plan: 1 and TIVA and Treatment may vary due to age or medical condition  Airway Management Planned: Natural Airway and Simple Face Mask  Additional Equipment:   Intra-op Plan:   Post-operative Plan:   Informed Consent: I have reviewed the patients History and Physical, chart, labs and discussed the procedure including the risks, benefits and alternatives for the proposed anesthesia with the patient or authorized representative who has indicated his/her understanding and acceptance.     Dental advisory given  Plan Discussed with: CRNA and Anesthesiologist  Anesthesia Plan Comments:          Anesthesia Quick Evaluation

## 2023-01-16 NOTE — Progress Notes (Signed)
PHARMACY - TOTAL PARENTERAL NUTRITION CONSULT NOTE   Indication: Intolerance to enteral feeding  Patient Measurements: Weight: 68.1 kg (150 lb 3.2 oz)   Body mass index is 19.82 kg/m.   Assessment: Pt is a 81 yoM admitted on 4/8 with N/V, chronic partial SBO, history of multiple previous abdominal surgeries. Pt has very minimal PO intake with ongoing weight loss. Pharmacy consulted to manage TPN.   Glucose / Insulin: No history of DM. CBGs at goal Electrolytes: Mg (1.8) goal >=2, K (3.7) goal >=4, Phos (1.7) lower than yesterday despite Kphos repletion. Na 136 improved. Corrected Ca (7.9) improved but still low -Cl elevated, Bicarb low Renal: SCr 0.99 stable, BUN WNL improved Hepatic: LFTs, Tbili, Alk Phos, Albumin all WNL Intake / Output; MIVF: net I/O +1.25L -UOP: 2.15L. Stool x3. 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 GI Surgeries / Procedures:   Central access: Double lumen PICC placed 4/8 TPN start date: 4/9  Nutritional Goals:  Goal TPN rate is 85 mL/hr (provides 112 g of protein and 2101 kcals per day)  RD Assessment: Estimated Needs Total Energy Estimated Needs: 2050-2250 Total Protein Estimated Needs: 100-115g Total Fluid Estimated Needs: 2.1L/day  Current Nutrition:  NPO except for ice chips TPN  Plan:  Now: K Phos 30 mmol IV once already ordered per Md this AM (will provide ~40 mEq K outside of TPN) Mag 2g IV x 1  At 1800: Increase TPN from 40 mL/hr to 60 ml/hr at 1800 Advance slowly given risk of refeeding syndrome Electrolytes in TPN: Na 75 mEq/L, K 74mEq/L (increase), Ca 11mEq/L, Mg 43mEq/L (increase), and Phos 64mmol/L (increase) [id number] 1:2 Add standard MVI and trace elements to TPN Thiamine added to TPN Continue Sensitive q6h SSI and adjust as needed  Reduce MIVF NS from 70 mL/hr to 50 ml/hr at 1800 Monitor TPN labs on Mon/Thurs, recheck electrolytes with AM  labs tomorrow.     Hessie Knows, PharmD, BCPS Secure Chat if ?s 01/16/2023 8:17 AM

## 2023-01-16 NOTE — Transfer of Care (Signed)
Immediate Anesthesia Transfer of Care Note  Patient: Bradley Dominguez  Procedure(s) Performed: ESOPHAGOGASTRODUODENOSCOPY (EGD) WITH PROPOFOL BIOPSY  Patient Location: Endoscopy Unit  Anesthesia Type:MAC  Level of Consciousness: drowsy  Airway & Oxygen Therapy: Patient Spontanous Breathing and Patient connected to face mask  Post-op Assessment: Report given to RN and Post -op Vital signs reviewed and stable  Post vital signs: Reviewed and stable  Last Vitals:  Vitals Value Taken Time  BP 76/53 01/16/23 1026  Temp 36.6 C 01/16/23 1020  Pulse 64 01/16/23 1027  Resp 13 01/16/23 1027  SpO2 100 % 01/16/23 1027  Vitals shown include unvalidated device data.  Last Pain:  Vitals:   01/16/23 1020  TempSrc: Tympanic  PainSc: Asleep         Complications: No notable events documented.

## 2023-01-16 NOTE — Interval H&P Note (Signed)
History and Physical Interval Note:  01/16/2023 9:39 AM  Bradley Dominguez  has presented today for surgery, with the diagnosis of Rule out partial duodenal obstruction.  The various methods of treatment have been discussed with the patient and family. After consideration of risks, benefits and other options for treatment, the patient has consented to  Procedure(s): ESOPHAGOGASTRODUODENOSCOPY (EGD) WITH PROPOFOL (N/A) as a surgical intervention.  The patient's history has been reviewed, patient examined, no change in status, stable for surgery.  I have reviewed the patient's chart and labs.  Questions were answered to the patient's satisfaction.     Lynann Bologna

## 2023-01-16 NOTE — Progress Notes (Signed)
Patient does not want to use Fentanyl under any circumstance. Signed note is in the patient's chart. Thanks!

## 2023-01-16 NOTE — Progress Notes (Signed)
Day of Surgery  Subjective: Underwent EGD this morning. No acute complaints. Denies nausea and bloating.   Objective: Vital signs in last 24 hours: Temp:  [97.3 F (36.3 C)-97.9 F (36.6 C)] 97.8 F (36.6 C) (04/11 1020) Pulse Rate:  [54-94] 54 (04/11 1148) Resp:  [11-20] 16 (04/11 1148) BP: (76-97)/(53-69) 84/62 (04/11 1148) SpO2:  [96 %-100 %] 100 % (04/11 1148) Weight:  [68.1 kg] 68.1 kg (04/11 0931) Last BM Date : 01/15/23  Intake/Output from previous day: 04/10 0701 - 04/11 0700 In: 3408.6 [P.O.:720; I.V.:2178.2; IV Piggyback:510.4] Out: 2150 [Urine:2150] Intake/Output this shift: Total I/O In: 400 [I.V.:400] Out: -   PE: General: resting comfortably, NAD Neuro: alert and oriented, no focal deficits Resp: normal work of breathing on room air Abdomen: soft, nondistended, nontender to palpation. Midline laparotomy scar. Extremities: warm and well-perfused   Lab Results:  Recent Labs    01/13/23 1522  WBC 6.6  HGB 11.9*  HCT 34.9*  PLT 156   BMET Recent Labs    01/15/23 0433 01/16/23 0333  NA 133* 136  K 3.1* 3.7  CL 113* 114*  CO2 16* 17*  GLUCOSE 104* 95  BUN 24* 20  CREATININE 1.08 0.99  CALCIUM 6.8* 7.5*   PT/INR No results for input(s): "LABPROT", "INR" in the last 72 hours. CMP     Component Value Date/Time   NA 136 01/16/2023 0333   K 3.7 01/16/2023 0333   CL 114 (H) 01/16/2023 0333   CO2 17 (L) 01/16/2023 0333   GLUCOSE 95 01/16/2023 0333   BUN 20 01/16/2023 0333   CREATININE 0.99 01/16/2023 0333   CALCIUM 7.5 (L) 01/16/2023 0333   PROT 5.6 (L) 01/16/2023 0333   ALBUMIN 3.5 01/16/2023 0333   AST 19 01/16/2023 0333   ALT 24 01/16/2023 0333   ALKPHOS 60 01/16/2023 0333   BILITOT 0.9 01/16/2023 0333   GFRNONAA >60 01/16/2023 0333   GFRAA >60 09/23/2017 0507   Lipase  No results found for: "LIPASE"     Studies/Results: CT ABDOMEN PELVIS W CONTRAST  Result Date: 01/15/2023 CLINICAL DATA:  Abdominal distension, suspect  partial obstruction. EXAM: CT ABDOMEN AND PELVIS WITH CONTRAST TECHNIQUE: Multidetector CT imaging of the abdomen and pelvis was performed using the standard protocol following bolus administration of intravenous contrast. RADIATION DOSE REDUCTION: This exam was performed according to the departmental dose-optimization program which includes automated exposure control, adjustment of the mA and/or kV according to patient size and/or use of iterative reconstruction technique. CONTRAST:  OMNIPAQUE IOHEXOL 300 MG/ML  SOLN COMPARISON:  09/16/2022. FINDINGS: Lower chest: Stable subpleural nodules are noted bilaterally unchanged from 2018 and are likely benign. The lung bases are otherwise clear. Hepatobiliary: 6 mm hypodensity is present in the left lobe of the liver, most likely cyst or hemangioma. Fatty infiltration of the liver is noted. No biliary ductal dilatation. The gallbladder is not visualized on exam. Pancreas: Unremarkable. No pancreatic ductal dilatation or surrounding inflammatory changes. Spleen: Normal in size without focal abnormality. Adrenals/Urinary Tract: The adrenal glands are within normal limits. The kidneys enhance symmetrically. No renal calculus or hydronephrosis. The bladder is unremarkable. Stomach/Bowel: Surgical clips are present at the gastroesophageal junction. Stomach is within normal limits. There are multiple distended loops of fluid-filled small bowel in the abdomen measuring up to 6.0 cm. No definite transition point is seen. Paucity of intra-abdominal fat limits evaluation of individual bowel loops. The visualized colon is mildly distended with stool. An anastomotic site is seen in  the small bowel in the right lower quadrant. No definite free air or pneumatosis. The appendix is not visualized on exam. Vascular/Lymphatic: No significant vascular findings are present. No enlarged abdominal or pelvic lymph nodes. Reproductive: Prostate is unremarkable. Other: No abdominopelvic  ascites. Musculoskeletal: No acute osseous abnormality. IMPRESSION: 1. Multiple distended loops of fluid-filled small bowel measuring up to 6.0 cm with no definite transition point, not significantly changed from the prior exam. Examination of the small bowel is limited due to lack of oral contrast and paucity of intra-abdominal fat findings may represent ileus versus high-grade partial small bowel obstruction. Surgical consultation is recommended. 2. Hepatic steatosis. 3. Aortic atherosclerosis. Electronically Signed   By: Thornell Sartorius M.D.   On: 01/15/2023 01:33    Anti-infectives: Anti-infectives (From admission, onward)    None        Assessment/Plan 67 yo male with chronic intermittent bloating, presenting with worsening symptoms, PO intolerance and dehydration. - Hypokalemia resolved. Replete phos today. - EGD reviewed and discussed with Dr. Chales Abrahams. There was a GJ, appeared to have an efferent limb but was widely patent, did not appear to be a loop. The duodenum was dilated and appeared to end in either a blind pouch or severe stricture. Based on these findings and upper GI, the previous GJ anastomosis does not seem to be functioning, although it is patent. I have reviewed this case with my partners and will plan to proceed with exploratory laparotomy with likely duodenojejunal bypass next week. Will also run the entire bowel to ensure there is not a distal obstruction and lyse any adhesions. - NPO except ice chips and sips of clear liquids. - Continue TPN - VTE: lovenox, SCDs - Dispo: inpatient     LOS: 3 days    Sophronia Simas, MD [facility] Surgery General, Hepatobiliary and Pancreatic Surgery 01/16/23 12:50 PM

## 2023-01-16 NOTE — Progress Notes (Signed)
Nutrition Follow-up  DOCUMENTATION CODES:   Severe malnutrition in context of chronic illness  INTERVENTION:   -Monitor magnesium, potassium, and phosphorus for at least 3 days, MD to replete as needed, as pt is at risk for refeeding syndrome.   -TPN management per Pharmacy -Continue 100 mg Thiamine x 5 days given refeeding risk (#3) -Daily weights while on TPN   -Will monitor for diet advancement  NUTRITION DIAGNOSIS:   Severe Malnutrition related to chronic illness as evidenced by moderate fat depletion, severe muscle depletion, energy intake < or equal to 75% for > or equal to 1 month, percent weight loss.  Ongoing.  GOAL:   Patient will meet greater than or equal to 90% of their needs  Progressing with TPN  MONITOR:   Diet advancement, Labs, Weight trends, I & O's (TPN)  ASSESSMENT:   67 yo male who was admitted from home with worsening nausea, vomiting, bloating and weakness. Patient with remote history of multiple prior laparotomies (in the 1970's), with a chronic partial small bowel obstruction.  4/8: admitted, PICC line placed 4/9: TPN initiated  Patient had EGD this morning. Per chart review, surgery now planning ex lap next week with possible duodenojejunal bypass.   TPN continues to advance to goal, advancing to 60 ml/hr today, This provides 1483 kcals and 79g protein. Per Pharmacy note, goal rate is 85 ml/hr.  Admission weight: 150 lbs Current  weight: 150 lbs  Medications: IV Mg sulfate, K-Phos  Labs reviewed: CBGs: 69-112 Low Phos   Diet Order:   Diet Order             Diet NPO time specified Except for: Ice Chips  Diet effective now                   EDUCATION NEEDS:   Education needs have been addressed  Skin:  Skin Assessment: Reviewed RN Assessment  Last BM:  4/10  Height:   Ht Readings from Last 1 Encounters:  01/16/23 6\' 1"  (1.854 m)    Weight:   Wt Readings from Last 1 Encounters:  01/16/23 68.1 kg   BMI:  Body  mass index is 19.81 kg/m.  Estimated Nutritional Needs:   Kcal:  2050-2250  Protein:  100-115g  Fluid:  2.1L/day   Tilda Franco, MS, RD, LDN Inpatient Clinical Dietitian Contact information available via Amion

## 2023-01-16 NOTE — Op Note (Signed)
[facility] Patient Name: Bradley BrunsCharles Nicolini Procedure Date: 01/16/2023 MRN: [id number] Attending MD: Lynann Bolognaajesh Bernadine Melecio , MD, [phone] Date of Birth: 1956-05-30 CSN: [id number] Age: [age] Admit Type: Inpatient Procedure:                Upper GI endoscopy Indications:              Persistent postprandial abdominal bloating with SBO Providers:                Lynann Bolognaajesh Sven Pinheiro, MD, Carlena HurlKaitlyn Stryjewski RN, RN,                            Rozetta NunneryAnthony Gillies, Technician Referring MD:             Ree KidaShelby L. Allen Medicines:                Monitored Anesthesia Care Complications:            No immediate complications. Estimated Blood Loss:     Estimated blood loss: none. Procedure:                Pre-Anesthesia Assessment:                           - Prior to the procedure, a History and Physical                            was performed, and patient medications and                            allergies were reviewed. The patient's tolerance of                            previous anesthesia was also reviewed. The risks                            and benefits of the procedure and the sedation                            options and risks were discussed with the patient.                            All questions were answered, and informed consent                            was obtained. Prior Anticoagulants: The patient has                            taken no anticoagulant or antiplatelet agents. ASA                            Grade Assessment: III - A patient with severe                            systemic disease. After reviewing the risks and  benefits, the patient was deemed in satisfactory                            condition to undergo the procedure.                           After obtaining informed consent, the endoscope was                            passed under direct vision. Throughout the                            procedure, the patient's blood pressure, pulse,  and                            oxygen saturations were monitored continuously. The                            [id number]-H190 ([id number]) Olympus endoscope was introduced                            through the mouth, and advanced to the jejunum. The                            upper GI endoscopy was accomplished without                            difficulty. The patient tolerated the procedure                            well. Scope In: Scope Out: Findings:      The examined esophagus was normal with stasis esophagitis. Z-line intact.      A medium amount of food (residue) was found in the gastric body did       limit exam.      The stomach was dilated with mild gastritis. Postoperative stomach-very       unusual type. Seems like gastrojejunostomy. The small bowel loops were       all dilated without any masses. Pylorus was intact but at a acute       angulation. This could only be entered on retroflexed examination and       then pushing the scope further. It led to a dilated first and second       portion of duodenum with a blind pouch and large retained solid food       which could not be cleared. Impression:               -Postoperative stomach                           -Gastric and small bowel dilation. Could have                            functional afferent loop syndrome or internal  hernia. No SB masses. Moderate Sedation:      Not Applicable - Patient had care per Anesthesia. Recommendation:           - Return patient to hospital ward for ongoing care.                           - Await pathology results.                           - Will discuss with Dr. Freida Busman. I have discussed                            with Dr. Meridee Score as well and shown him the                            endoscopic pictures                           - The findings and recommendations were discussed                            with the patient's family. Procedure Code(s):        ---  Professional ---                           [phone], Esophagogastroduodenoscopy, flexible,                            transoral; with biopsy, single or multiple Diagnosis Code(s):        --- Professional ---                           Z98.0, Intestinal bypass and anastomosis status                           R11.15, Cyclical vomiting syndrome unrelated to                            migraine CPT copyright 2022 [employer]. All rights reserved. The codes documented in this report are preliminary and upon coder review may  be revised to meet current compliance requirements. Lynann Bologna, MD 01/16/2023 10:53:30 AM This report has been signed electronically. Number of Addenda: 0

## 2023-01-16 NOTE — Anesthesia Procedure Notes (Signed)
Procedure Name: MAC Date/Time: 01/16/2023 9:41 AM  Performed by: Vanessa Liberty, CRNAPre-anesthesia Checklist: Patient identified, Emergency Drugs available, Suction available and Patient being monitored Patient Re-evaluated:Patient Re-evaluated prior to induction Oxygen Delivery Method: Simple face mask

## 2023-01-17 LAB — BASIC METABOLIC PANEL
Anion gap: 4 — ABNORMAL LOW (ref 5–15)
BUN: 19 mg/dL (ref 8–23)
CO2: 21 mmol/L — ABNORMAL LOW (ref 22–32)
Calcium: 7.8 mg/dL — ABNORMAL LOW (ref 8.9–10.3)
Chloride: 111 mmol/L (ref 98–111)
Creatinine, Ser: 0.94 mg/dL (ref 0.61–1.24)
GFR, Estimated: >60 mL/min (ref >60)
Glucose, Bld: 109 mg/dL — ABNORMAL HIGH (ref 70–99)
Potassium: 4.3 mmol/L (ref 3.5–5.1)
Sodium: 136 mmol/L (ref 135–145)

## 2023-01-17 LAB — GLUCOSE, CAPILLARY
Glucose-Capillary: 105 mg/dL — ABNORMAL HIGH (ref 70–99)
Glucose-Capillary: 112 mg/dL — ABNORMAL HIGH (ref 70–99)
Glucose-Capillary: 89 mg/dL (ref 70–99)
Glucose-Capillary: 94 mg/dL (ref 70–99)
Glucose-Capillary: 94 mg/dL (ref 70–99)

## 2023-01-17 LAB — MAGNESIUM: Magnesium: 1.7 mg/dL (ref 1.7–2.4)

## 2023-01-17 LAB — PHOSPHORUS: Phosphorus: 1.9 mg/dL — ABNORMAL LOW (ref 2.5–4.6)

## 2023-01-17 LAB — SURGICAL PATHOLOGY

## 2023-01-17 MED ORDER — SODIUM PHOSPHATES 45 MMOLE/15ML IV SOLN: 20.0000 mmol | Freq: Once | INTRAVENOUS | Status: DC

## 2023-01-17 MED ORDER — INSULIN ASPART 100 UNIT/ML IJ SOLN: 0.0000 [IU] | Freq: Four times a day (QID) | INTRAMUSCULAR | Status: DC

## 2023-01-17 MED ORDER — SODIUM PHOSPHATES 45 MMOLE/15ML IV SOLN
30.0000 mmol | Freq: Once | INTRAVENOUS | Status: AC
  Administered 2023-01-17: 30 mmol via INTRAVENOUS
  Filled 2023-01-17: qty 10

## 2023-01-17 MED ORDER — TRAVASOL 10 % IV SOLN
INTRAVENOUS | Status: AC
  Filled 2023-01-17: qty 792

## 2023-01-17 MED ORDER — MAGNESIUM SULFATE 2 GM/50ML IV SOLN
2.0000 g | Freq: Once | INTRAVENOUS | Status: AC
  Administered 2023-01-17: 2 g via INTRAVENOUS
  Filled 2023-01-17: qty 50

## 2023-01-17 NOTE — Progress Notes (Signed)
Mobility Specialist - Progress Note   01/17/23 1022  Mobility  Activity Ambulated with assistance in hallway  Level of Assistance Modified independent, requires aide device or extra time  Assistive Device Other (Comment) (IV Pole)  Distance Ambulated (ft) 1000 ft  Activity Response Tolerated well  Mobility Referral Yes  $Mobility charge 1 Mobility   Pt received in bed and agreeable to mobility. No complaints during session. Pt to bed after session with all needs met w/ family in room.    [facility]

## 2023-01-17 NOTE — Anesthesia Postprocedure Evaluation (Signed)
Anesthesia Post Note  Patient: Richar Youngblood  Procedure(s) Performed: ESOPHAGOGASTRODUODENOSCOPY (EGD) WITH PROPOFOL BIOPSY     Patient location during evaluation: Endoscopy Anesthesia Type: MAC Level of consciousness: oriented, awake and alert and awake Pain management: pain level controlled Vital Signs Assessment: post-procedure vital signs reviewed and stable Respiratory status: spontaneous breathing, nonlabored ventilation, respiratory function stable and patient connected to nasal cannula oxygen Cardiovascular status: blood pressure returned to baseline and stable Postop Assessment: no headache, no backache and no apparent nausea or vomiting Anesthetic complications: no   No notable events documented.  Last Vitals:  Vitals:   01/16/23 1957 01/17/23 0517  BP: 94/62 92/67  Pulse: 67 67  Resp: 18 18  Temp: (!) 36.4 C 36.6 C  SpO2: 100% 98%    Last Pain:  Vitals:   01/17/23 0517  TempSrc: Oral  PainSc:                  Collene Schlichter

## 2023-01-17 NOTE — Progress Notes (Signed)
    1 Day Post-Op  Subjective: No acute changes. Labs pending this morning. Denies nausea/vomiting.   Objective: Vital signs in last 24 hours: Temp:  [97.5 F (36.4 C)-97.9 F (36.6 C)] 97.9 F (36.6 C) (04/12 0517) Pulse Rate:  [48-94] 67 (04/12 0517) Resp:  [11-18] 18 (04/12 0517) BP: (76-94)/(53-67) 92/67 (04/12 0517) SpO2:  [96 %-100 %] 98 % (04/12 0517) Weight:  [68.1 kg-68.3 kg] 68.3 kg (04/12 0500) Last BM Date : 01/16/23  Intake/Output from previous day: 04/11 0701 - 04/12 0700 In: 2822.7 [I.V.:2772.7; IV Piggyback:50] Out: 2000 [Urine:2000] Intake/Output this shift: No intake/output data recorded.  PE: General: resting comfortably, NAD Neuro: alert and oriented, no focal deficits Resp: normal work of breathing on room air Abdomen: soft, nondistended, nontender to palpation. Midline laparotomy scar. Extremities: warm and well-perfused   Lab Results:  No results for input(s): "WBC", "HGB", "HCT", "PLT" in the last 72 hours.  BMET Recent Labs    01/15/23 0433 01/16/23 0333  NA 133* 136  K 3.1* 3.7  CL 113* 114*  CO2 16* 17*  GLUCOSE 104* 95  BUN 24* 20  CREATININE 1.08 0.99  CALCIUM 6.8* 7.5*   PT/INR No results for input(s): "LABPROT", "INR" in the last 72 hours. CMP     Component Value Date/Time   NA 136 01/16/2023 0333   K 3.7 01/16/2023 0333   CL 114 (H) 01/16/2023 0333   CO2 17 (L) 01/16/2023 0333   GLUCOSE 95 01/16/2023 0333   BUN 20 01/16/2023 0333   CREATININE 0.99 01/16/2023 0333   CALCIUM 7.5 (L) 01/16/2023 0333   PROT 5.6 (L) 01/16/2023 0333   ALBUMIN 3.5 01/16/2023 0333   AST 19 01/16/2023 0333   ALT 24 01/16/2023 0333   ALKPHOS 60 01/16/2023 0333   BILITOT 0.9 01/16/2023 0333   GFRNONAA >60 01/16/2023 0333   GFRAA >60 09/23/2017 0507   Lipase  No results found for: "LIPASE"     Studies/Results: No results found.  Anti-infectives: Anti-infectives (From admission, onward)    None        Assessment/Plan 67 yo  male with chronic intermittent bloating, presenting with worsening symptoms, PO intolerance and dehydration. - EGD and upper GI confirm duodenal obstruction. 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 Plan for surgery on Monday 4/15 for exploratory laparotomy, lysis of adhesions, and duodenojejunal bypass. I discussed this plan with the patient and his wife this morning at bedside, and reviewed the benefits and risks including bleeding, infection, postop ileus, anastomotic leak, and damage to surrounding structures. He agrees to proceed with surgery. - Daily BMP, replete electrolytes as needed. Labs pending this morning. - NPO except ice chips and sips of clear liquids. - Continue TPN - VTE: lovenox, SCDs - Dispo: inpatient     LOS: 4 days    Sophronia Simas, MD [facility] Surgery General, Hepatobiliary and Pancreatic Surgery 01/17/23 7:59 AM

## 2023-01-17 NOTE — Progress Notes (Signed)
PHARMACY - TOTAL PARENTERAL NUTRITION CONSULT NOTE   Indication: Intolerance to enteral feeding  Patient Measurements: Height: 6\' 1"  (185.4 cm) Weight: 68.3 kg (150 lb 9.2 oz) IBW/kg (Calculated) : 79.9 TPN AdjBW (KG): 68.1 Body mass index is 19.87 kg/m.   Assessment: Pt is a 36 yoM admitted on 4/8 with N/V, chronic partial SBO, history of multiple previous abdominal surgeries. Pt has very minimal PO intake with ongoing weight loss. Pharmacy consulted to manage TPN.   Glucose / Insulin: No history of DM. Goal CBG: <180 -BG range: 69 - 105 (one low reading while on TPN) Electrolytes: Phos (1.9) remains low despite aggressive repletion. Mg (1.7) on lower end of normal. All other lytes WNL, including CorrCa (8.2) -Cl WNL, Bicarb slightly low Renal: SCr 0.99 stable, BUN WNL improved Hepatic: LFTs, Tbili, Alk Phos, all WNL.  Intake / Output; MIVF: net I/O +822 mL -UOP: 200 mL. LBM 4/11 -mIVF: NS at 50 mL/hr GI Imaging:  -4/4 UGI: Findings suggestive of chronic partial small bowel obstruction or ileus -4/9 CT Abd: Ileus vs high grade partial SBO GI Surgeries / Procedures:  -4/11 EGD: Gastric and small bowel dilation. Could have functional afferent loop syndrome or internal hernia -4/15: Anticipated possible ex lap  Central access: Double lumen PICC placed 4/8 TPN start date: 4/9  Nutritional Goals:  Goal TPN rate is 85 mL/hr (provides 112 g of protein and 2101 kcals per day)  RD Assessment: Estimated Needs Total Energy Estimated Needs: 2050-2250 Total Protein Estimated Needs: 100-115g Total Fluid Estimated Needs: 2.1L/day  Current Nutrition:  NPO except for ice chips TPN  Plan:  Now: Reduce q6h SSI from sensitive to very sensitive Mg 2 g IV once Na Phos 30 mmol IV once ordered by MD  At 1800: Continue TPN at 60 mL/hr Will hold off on advancing rate given hypophosphatemia Will provide 1483 kcal, 79 g protein, 216 g dextrose Electrolytes in TPN: Slight change to Ca,  Phos Na 75 mEq/L, K 41mEq/L, Ca 8 mEq/L, Mg 63mEq/L, and Phos 18 mmol/L GG:[id number] 1:2 Add standard MVI and trace elements to TPN Thiamine added to TPN Continue very Sensitive q6h SSI and adjust as needed  Continue mIVF @ 50 mL/hr Monitor TPN labs on Mon/Thurs, recheck electrolytes with AM labs tomorrow.   Cindi Carbon, PharmD 01/17/23 9:06 AM

## 2023-01-18 LAB — BASIC METABOLIC PANEL
Anion gap: 6 (ref 5–15)
BUN: 19 mg/dL (ref 8–23)
CO2: 25 mmol/L (ref 22–32)
Calcium: 8.4 mg/dL — ABNORMAL LOW (ref 8.9–10.3)
Chloride: 105 mmol/L (ref 98–111)
Creatinine, Ser: 0.95 mg/dL (ref 0.61–1.24)
GFR, Estimated: >60 mL/min (ref >60)
Glucose, Bld: 88 mg/dL (ref 70–99)
Potassium: 4.5 mmol/L (ref 3.5–5.1)
Sodium: 136 mmol/L (ref 135–145)

## 2023-01-18 LAB — GLUCOSE, CAPILLARY
Glucose-Capillary: 104 mg/dL — ABNORMAL HIGH (ref 70–99)
Glucose-Capillary: 84 mg/dL (ref 70–99)
Glucose-Capillary: 95 mg/dL (ref 70–99)

## 2023-01-18 LAB — CBC
HCT: 29.1 % — ABNORMAL LOW (ref 39.0–52.0)
Hemoglobin: 9.3 g/dL — ABNORMAL LOW (ref 13.0–17.0)
MCH: 32.3 pg (ref 26.0–34.0)
MCHC: 32 g/dL (ref 30.0–36.0)
MCV: 101 fL — ABNORMAL HIGH (ref 80.0–100.0)
Platelets: 112 10*3/uL — ABNORMAL LOW (ref 150–400)
RBC: 2.88 MIL/uL — ABNORMAL LOW (ref 4.22–5.81)
RDW: 14.5 % (ref 11.5–15.5)
WBC: 8.6 10*3/uL (ref 4.0–10.5)
nRBC: 0 % (ref 0.0–0.2)

## 2023-01-18 LAB — MAGNESIUM: Magnesium: 2 mg/dL (ref 1.7–2.4)

## 2023-01-18 LAB — PHOSPHORUS: Phosphorus: 2.6 mg/dL (ref 2.5–4.6)

## 2023-01-18 MED ORDER — SODIUM CHLORIDE 0.9 % IV SOLN: INTRAVENOUS | Status: DC

## 2023-01-18 MED ORDER — TRAVASOL 10 % IV SOLN
INTRAVENOUS | Status: AC
  Filled 2023-01-18: qty 1122

## 2023-01-18 MED ORDER — SODIUM CHLORIDE 0.9 % IV BOLUS
500.0000 mL | Freq: Once | INTRAVENOUS | Status: AC
  Administered 2023-01-18: 500 mL via INTRAVENOUS

## 2023-01-18 MED ORDER — INSULIN ASPART 100 UNIT/ML IJ SOLN
0.0000 [IU] | Freq: Three times a day (TID) | INTRAMUSCULAR | Status: DC
  Administered 2023-01-19: 1 [IU] via SUBCUTANEOUS

## 2023-01-18 NOTE — Progress Notes (Signed)
   01/18/23 2209  Assess: MEWS Score  Temp 98.6 F (37 C)  BP (!) 76/59  MAP (mmHg) 65  Pulse Rate (!) 102  Resp 14  SpO2 97 %  O2 Device Room Air  Assess: MEWS Score  MEWS Temp 0  MEWS Systolic 2  MEWS Pulse 1  MEWS RR 0  MEWS LOC 0  MEWS Score 3  MEWS Score Color Yellow  Assess: if the MEWS score is Yellow or Red  Were vital signs taken at a resting state? Yes  Focused Assessment Change from prior assessment (see assessment flowsheet)  Does the patient meet 2 or more of the SIRS criteria? No  MEWS guidelines implemented  Yes, yellow  Treat  MEWS Interventions Considered administering scheduled or prn medications/treatments as ordered  Take Vital Signs  Increase Vital Sign Frequency  Yellow: Q2hr x1, continue Q4hrs until patient remains green for 12hrs  Escalate  MEWS: Escalate Yellow: Discuss with charge nurse and consider notifying provider and/or RRT  Notify: Charge Nurse/RN  Name of Charge Nurse/RN Notified Bishnu,RN  Provider Notification  Provider Name/Title Dr. Gaynelle Adu  Date Provider Notified 01/18/23  Time Provider Notified 2219  Method of Notification Page  Notification Reason Other (Comment) (yellow mews d/t low bp,pulse 102)  Provider response See new orders  Date of Provider Response 01/18/23  Time of Provider Response 2224  Assess: SIRS CRITERIA  SIRS Temperature  0  SIRS Pulse 1  SIRS Respirations  0  SIRS WBC 0  SIRS Score Sum  1

## 2023-01-18 NOTE — Progress Notes (Signed)
Mobility Specialist - Progress Note   01/18/23 0913  Mobility  Activity Ambulated with assistance in hallway  Level of Assistance Independent after set-up  Assistive Device Other (Comment) (IV Pole)  Distance Ambulated (ft) 600 ft  Activity Response Tolerated well  Mobility Referral Yes  $Mobility charge 1 Mobility   Pt received in bed and agreeable to mobility. No complaints during session. Pt to bed after session with all needs met.    [facility]

## 2023-01-18 NOTE — Progress Notes (Signed)
Patient ID: Bradley Dominguez, male   DOB: 11/28/1955, 67 y.o.   MRN: [id number]   Acute Care Surgery Service Progress Note:    Chief Complaint/Subjective: No issues overnight Asks if he will have ng after surgery on monday  Objective: Vital signs in last 24 hours: Temp:  [97.5 F (36.4 C)-98 F (36.7 C)] 97.7 F (36.5 C) (04/13 0512) Pulse Rate:  [65-70] 65 (04/13 0512) Resp:  [18] 18 (04/13 0512) BP: (90-91)/(63-69) 90/68 (04/13 0512) SpO2:  [98 %-100 %] 100 % (04/13 0512) Weight:  [69.2 kg] 69.2 kg (04/13 0500) Last BM Date : 01/17/23  Intake/Output from previous day: 04/12 0701 - 04/13 0700 In: 3566.2 [P.O.:720; I.V.:2630.8; IV Piggyback:215.4] Out: 2430 [Urine:2430] Intake/Output this shift: No intake/output data recorded.  Lungs: nonlabored  Cardiovascular: reg  Abd:nd   Extremities: no edema, +SCDs  Neuro: alert, nonfocal  Lab Results: CBC  No results for input(s): "WBC", "HGB", "HCT", "PLT" in the last 72 hours. BMET Recent Labs    01/17/23 0842 01/18/23 0330  NA 136 136  K 4.3 4.5  CL 111 105  CO2 21* 25  GLUCOSE 109* 88  BUN 19 19  CREATININE 0.94 0.95  CALCIUM 7.8* 8.4*   LFT    Latest Ref Rng & Units 01/16/2023    3:33 AM 01/13/2023    3:22 PM  Hepatic Function  Total Protein 6.5 - 8.1 g/dL 5.6  5.9   Albumin 3.5 - 5.0 g/dL 3.5  3.7   AST 15 - 41 U/L 19  23   ALT 0 - 44 U/L 24  26   Alk Phosphatase 38 - 126 U/L 60  59   Total Bilirubin 0.3 - 1.2 mg/dL 0.9  1.0    PT/INR No results for input(s): "LABPROT", "INR" in the last 72 hours. ABG No results for input(s): "PHART", "HCO3" in the last 72 hours.  Invalid input(s): "PCO2", "PO2"  Studies/Results:  Anti-infectives: Anti-infectives (From admission, onward)    None       Medications: Scheduled Meds:  Chlorhexidine Gluconate Cloth  6 each Topical Daily   enoxaparin (LOVENOX) injection  40 mg Subcutaneous Q24H   insulin aspart  0-6 Units Subcutaneous Q8H   sodium chloride  flush  10-40 mL Intracatheter Q12H   Continuous Infusions:  sodium chloride 50 mL/hr at 01/17/23 2316   sodium chloride     TPN ADULT (ION) 60 mL/hr at 01/17/23 1758   TPN ADULT (ION)     PRN Meds:.acetaminophen, diphenhydrAMINE, morphine injection, ondansetron (ZOFRAN) IV, sodium chloride flush  Assessment/Plan: Patient Active Problem List   Diagnosis Date Noted   Protein-calorie malnutrition, severe 01/14/2023   Bowel obstruction 01/13/2023   BPH (benign prostatic hyperplasia) 09/22/2017   Hx of colonic polyps 01/18/2015   Rectal bleeding 01/18/2015   s/p Procedure(s): ESOPHAGOGASTRODUODENOSCOPY (EGD) WITH PROPOFOL BIOPSY 01/16/2023  67 yo male with chronic intermittent bloating, presenting with worsening symptoms, PO intolerance and dehydration. - EGD and upper GI confirm duodenal obstruction. 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 Plan for surgery on Monday 4/15 for exploratory laparotomy, lysis of adhesions, and duodenojejunal bypass with dr Freida Busman   . - Daily BMP, replete electrolytes as needed. Labs pending this morning. - NPO except ice chips and sips of clear liquids. - Continue TPN - VTE: lovenox, SCDs  Disposition: Discussed with patient that more than likely he will either have a nasogastric tube after his surgery on Monday and or enteric tubes because generally there is a high  risk of gastric ileus/gastroparesis when the patient has this type of surgical condition and has this type of surgery performed.  Urged patient to ambulate today and tomorrow.  Will preop the patient on Sunday for surgery on Monday for Dr. Freida Busman   LOS: 5 days    Bradley Dominguez. Andrey Campanile, MD, FACS General, Bariatric, & Minimally Invasive Surgery [phone] [facility] Surgery, A [facility]

## 2023-01-18 NOTE — Progress Notes (Signed)
PHARMACY - TOTAL PARENTERAL NUTRITION CONSULT NOTE   Indication: Intolerance to enteral feeding  Patient Measurements: Height: 6\' 1"  (185.4 cm) Weight: 69.2 kg (152 lb 8 oz) IBW/kg (Calculated) : 79.9 TPN AdjBW (KG): 68.1 Body mass index is 20.12 kg/m.   Assessment: Pt is a 79 yoM admitted on 4/8 with N/V, chronic partial SBO, history of multiple previous abdominal surgeries. Pt has very minimal PO intake with ongoing weight loss. Pharmacy consulted to manage TPN.   Glucose / Insulin: No history of DM. Goal CBG: <180 -BG range: 84 - 112. No SSI required.  Electrolytes: CorrCa (8.8) slightly low but trending up, all other lytes WNL Renal: SCr WNL/stable, BUN WNL  Hepatic: LFTs, Tbili, Alk Phos, all WNL.  Intake / Output; MIVF: Strict I/O not charted. -UOP: 2430 mL + 1 unmeasured. LBM 4/12 -mIVF: NS at 50 mL/hr GI Imaging:  -4/4 UGI: Findings suggestive of chronic partial small bowel obstruction or ileus -4/9 CT Abd: Ileus vs high grade partial SBO GI Surgeries / Procedures:  -4/11 EGD: Gastric and small bowel dilation. Could have functional afferent loop syndrome or internal hernia -4/15: Anticipated possible ex lap  Central access: Double lumen PICC placed 4/8 TPN start date: 4/9  Nutritional Goals:  Goal TPN rate is 85 mL/hr (provides 112 g of protein and 2101 kcals per day)  RD Assessment: Estimated Needs Total Energy Estimated Needs: 2050-2250 Total Protein Estimated Needs: 100-115g Total Fluid Estimated Needs: 2.1L/day  Current Nutrition:  NPO except for ice chips TPN  Plan:  Reduce frequency of CBG and very sensitive SSI from q6h to q8h  At 1800: Increase TPN to goal rate of 85 mL/hr Electrolytes in TPN: Decrease K, slightly decrease Ca. Increase Na. Na 100 mEq/L, K 40 mEq/L, Ca 6 mEq/L, Mg 11mEq/L, and Phos 18 mmol/L PP:HKFEXMD 1:2 Add standard MVI and trace elements to TPN Thiamine added to TPN Continue very Sensitive q8h SSI and adjust as needed  If pt  doesn't require any SSI at goal rate of TPN, will plan to stop CBG checks Reduce mIVF to [facility] Monitor TPN labs on Mon/Thurs, recheck electrolytes with AM labs tomorrow.   Cindi Carbon, PharmD 01/18/23 8:48 AM

## 2023-01-19 ENCOUNTER — Encounter (HOSPITAL_COMMUNITY): Admission: AD | Disposition: E | Payer: Self-pay | Source: Ambulatory Visit

## 2023-01-19 ENCOUNTER — Encounter (HOSPITAL_COMMUNITY): Payer: Self-pay | Admitting: Anesthesiology

## 2023-01-19 ENCOUNTER — Encounter (HOSPITAL_COMMUNITY): Payer: Self-pay | Admitting: Gastroenterology

## 2023-01-19 ENCOUNTER — Inpatient Hospital Stay (HOSPITAL_COMMUNITY): Payer: BC Managed Care – PPO

## 2023-01-19 DIAGNOSIS — K56609 Unspecified intestinal obstruction, unspecified as to partial versus complete obstruction: Secondary | ICD-10-CM

## 2023-01-19 DIAGNOSIS — K2901 Acute gastritis with bleeding: Principal | ICD-10-CM | POA: Diagnosis not present

## 2023-01-19 DIAGNOSIS — E876 Hypokalemia: Secondary | ICD-10-CM | POA: Diagnosis not present

## 2023-01-19 DIAGNOSIS — R112 Nausea with vomiting, unspecified: Secondary | ICD-10-CM | POA: Diagnosis not present

## 2023-01-19 DIAGNOSIS — I959 Hypotension, unspecified: Secondary | ICD-10-CM

## 2023-01-19 DIAGNOSIS — E46 Unspecified protein-calorie malnutrition: Secondary | ICD-10-CM | POA: Diagnosis not present

## 2023-01-19 DIAGNOSIS — R634 Abnormal weight loss: Secondary | ICD-10-CM | POA: Diagnosis not present

## 2023-01-19 HISTORY — PX: HEMOSTASIS CONTROL: SHX6838

## 2023-01-19 HISTORY — PX: ESOPHAGOGASTRODUODENOSCOPY: SHX5428

## 2023-01-19 HISTORY — PX: HEMOSTASIS CLIP PLACEMENT: SHX6857

## 2023-01-19 HISTORY — PX: SUBMUCOSAL INJECTION: SHX5543

## 2023-01-19 LAB — TYPE AND SCREEN

## 2023-01-19 LAB — CBC
HCT: 26.8 % — ABNORMAL LOW (ref 39.0–52.0)
HCT: 26.9 % — ABNORMAL LOW (ref 39.0–52.0)
HCT: 30.9 % — ABNORMAL LOW (ref 39.0–52.0)
Hemoglobin: 8.6 g/dL — ABNORMAL LOW (ref 13.0–17.0)
Hemoglobin: 8.8 g/dL — ABNORMAL LOW (ref 13.0–17.0)
Hemoglobin: 10 g/dL — ABNORMAL LOW (ref 13.0–17.0)
MCH: 32.7 pg (ref 26.0–34.0)
MCH: 34.8 pg — ABNORMAL HIGH (ref 26.0–34.0)
MCH: 31.3 pg (ref 26.0–34.0)
MCHC: 32.1 g/dL (ref 30.0–36.0)
MCHC: 32.7 g/dL (ref 30.0–36.0)
MCHC: 32.4 g/dL (ref 30.0–36.0)
MCV: 101.9 fL — ABNORMAL HIGH (ref 80.0–100.0)
MCV: 106.3 fL — ABNORMAL HIGH (ref 80.0–100.0)
MCV: 96.9 fL (ref 80.0–100.0)
Platelets: 128 10*3/uL — ABNORMAL LOW (ref 150–400)
Platelets: 100 10*3/uL — ABNORMAL LOW (ref 150–400)
Platelets: 118 10*3/uL — ABNORMAL LOW (ref 150–400)
RBC: 2.63 MIL/uL — ABNORMAL LOW (ref 4.22–5.81)
RBC: 2.53 MIL/uL — ABNORMAL LOW (ref 4.22–5.81)
RBC: 3.19 MIL/uL — ABNORMAL LOW (ref 4.22–5.81)
RDW: 14.4 % (ref 11.5–15.5)
RDW: 16.1 % — ABNORMAL HIGH (ref 11.5–15.5)
RDW: 16.1 % — ABNORMAL HIGH (ref 11.5–15.5)
WBC: 10 10*3/uL (ref 4.0–10.5)
WBC: 10.7 10*3/uL — ABNORMAL HIGH (ref 4.0–10.5)
WBC: 11.6 10*3/uL — ABNORMAL HIGH (ref 4.0–10.5)
nRBC: 0 % (ref 0.0–0.2)
nRBC: 0 % (ref 0.0–0.2)
nRBC: 0 % (ref 0.0–0.2)

## 2023-01-19 LAB — BLOOD GAS, ARTERIAL
Acid-base deficit: 2.3 mmol/L — ABNORMAL HIGH (ref 0.0–2.0)
Bicarbonate: 22.4 mmol/L (ref 20.0–28.0)
Drawn by: 30136
FIO2: 100 %
MECHVT: 630 mL
O2 Saturation: 98.2 %
PEEP: 5 cmH2O
Patient temperature: 36.1
RATE: 18 resp/min
pCO2 arterial: 36 mmHg (ref 32–48)
pH, Arterial: 7.4 (ref 7.35–7.45)
pO2, Arterial: 446 mmHg — ABNORMAL HIGH (ref 83–108)

## 2023-01-19 LAB — BASIC METABOLIC PANEL
Anion gap: 4 — ABNORMAL LOW (ref 5–15)
BUN: 61 mg/dL — ABNORMAL HIGH (ref 8–23)
CO2: 20 mmol/L — ABNORMAL LOW (ref 22–32)
Calcium: 7.4 mg/dL — ABNORMAL LOW (ref 8.9–10.3)
Chloride: 108 mmol/L (ref 98–111)
Creatinine, Ser: 1.05 mg/dL (ref 0.61–1.24)
GFR, Estimated: >60 mL/min (ref >60)
Glucose, Bld: 191 mg/dL — ABNORMAL HIGH (ref 70–99)
Potassium: 4.4 mmol/L (ref 3.5–5.1)
Sodium: 132 mmol/L — ABNORMAL LOW (ref 135–145)

## 2023-01-19 LAB — BPAM RBC
Blood Product Expiration Date: 202405132359
Unit Type and Rh: 9500
Unit Type and Rh: 9500

## 2023-01-19 LAB — GLUCOSE, CAPILLARY
Glucose-Capillary: 101 mg/dL — ABNORMAL HIGH (ref 70–99)
Glucose-Capillary: 120 mg/dL — ABNORMAL HIGH (ref 70–99)
Glucose-Capillary: 140 mg/dL — ABNORMAL HIGH (ref 70–99)
Glucose-Capillary: 172 mg/dL — ABNORMAL HIGH (ref 70–99)

## 2023-01-19 LAB — DIC (DISSEMINATED INTRAVASCULAR COAGULATION)PANEL
D-Dimer, Quant: 1.12 ug/mL-FEU — ABNORMAL HIGH (ref 0.00–0.50)
Fibrinogen: 181 mg/dL — ABNORMAL LOW (ref 210–475)
INR: 1.4 — ABNORMAL HIGH (ref 0.8–1.2)
Platelets: 105 10*3/uL — ABNORMAL LOW (ref 150–400)
Prothrombin Time: 17.2 seconds — ABNORMAL HIGH (ref 11.4–15.2)
Smear Review: NONE SEEN
aPTT: 31 seconds (ref 24–36)

## 2023-01-19 LAB — PREPARE RBC (CROSSMATCH)

## 2023-01-19 LAB — MAGNESIUM: Magnesium: 1.5 mg/dL — ABNORMAL LOW (ref 1.7–2.4)

## 2023-01-19 LAB — ABO/RH: ABO/RH(D): O NEG

## 2023-01-19 LAB — PHOSPHORUS: Phosphorus: 2.5 mg/dL (ref 2.5–4.6)

## 2023-01-19 LAB — PROTIME-INR
INR: 1.3 — ABNORMAL HIGH (ref 0.8–1.2)
Prothrombin Time: 16.3 seconds — ABNORMAL HIGH (ref 11.4–15.2)

## 2023-01-19 LAB — FIBRINOGEN: Fibrinogen: 201 mg/dL — ABNORMAL LOW (ref 210–475)

## 2023-01-19 SURGERY — EGD (ESOPHAGOGASTRODUODENOSCOPY)
Anesthesia: Moderate Sedation

## 2023-01-19 MED ORDER — CHLORHEXIDINE GLUCONATE CLOTH 2 % EX PADS
6.0000 | MEDICATED_PAD | Freq: Once | CUTANEOUS | Status: AC
  Administered 2023-01-19: 6 via TOPICAL

## 2023-01-19 MED ORDER — SODIUM CHLORIDE 0.9 % IV SOLN
2.0000 g | INTRAVENOUS | Status: AC
  Administered 2023-01-19: 2 g via INTRAVENOUS
  Filled 2023-01-19: qty 20

## 2023-01-19 MED ORDER — PROPOFOL 1000 MG/100ML IV EMUL
5.0000 ug/kg/min | INTRAVENOUS | Status: DC
  Administered 2023-01-19: 5 ug/kg/min via INTRAVENOUS
  Filled 2023-01-19: qty 100

## 2023-01-19 MED ORDER — MAGNESIUM SULFATE IN D5W 1-5 GM/100ML-% IV SOLN
1.0000 g | Freq: Once | INTRAVENOUS | Status: AC
  Administered 2023-01-19: 1 g via INTRAVENOUS
  Filled 2023-01-19: qty 100

## 2023-01-19 MED ORDER — SODIUM CHLORIDE 0.9 % IV SOLN: INTRAVENOUS | Status: DC | PRN

## 2023-01-19 MED ORDER — ETOMIDATE 2 MG/ML IV SOLN
40.0000 mg | Freq: Once | INTRAVENOUS | Status: AC
  Administered 2023-01-19: 20 mg via INTRAVENOUS
  Filled 2023-01-19: qty 20

## 2023-01-19 MED ORDER — ALBUMIN HUMAN 25 % IV SOLN
25.0000 g | Freq: Four times a day (QID) | INTRAVENOUS | Status: DC
  Administered 2023-01-19 (×2): 25 g via INTRAVENOUS
  Filled 2023-01-19 (×2): qty 100

## 2023-01-19 MED ORDER — MAGNESIUM SULFATE 2 GM/50ML IV SOLN
2.0000 g | Freq: Once | INTRAVENOUS | Status: AC
  Administered 2023-01-19: 2 g via INTRAVENOUS
  Filled 2023-01-19: qty 50

## 2023-01-19 MED ORDER — PHENYLEPHRINE 80 MCG/ML (10ML) SYRINGE FOR IV PUSH (FOR BLOOD PRESSURE SUPPORT)
80.0000 ug | PREFILLED_SYRINGE | Freq: Once | INTRAVENOUS | Status: AC | PRN
  Administered 2023-01-19: 200 ug via INTRAVENOUS
  Filled 2023-01-19: qty 10

## 2023-01-19 MED ORDER — CALCIUM GLUCONATE-NACL 1-0.675 GM/50ML-% IV SOLN
1.0000 g | Freq: Once | INTRAVENOUS | Status: AC
  Administered 2023-01-19: 1000 mg via INTRAVENOUS
  Filled 2023-01-19: qty 50

## 2023-01-19 MED ORDER — PANTOPRAZOLE SODIUM 40 MG IV SOLR
40.0000 mg | Freq: Two times a day (BID) | INTRAVENOUS | Status: DC
  Administered 2023-01-22 – 2023-03-09 (×93): 40 mg via INTRAVENOUS
  Filled 2023-01-19 (×93): qty 10

## 2023-01-19 MED ORDER — METRONIDAZOLE 500 MG/100ML IV SOLN
500.0000 mg | Freq: Three times a day (TID) | INTRAVENOUS | Status: DC
  Administered 2023-01-19: 500 mg via INTRAVENOUS
  Filled 2023-01-19: qty 100

## 2023-01-19 MED ORDER — LACTATED RINGERS IV BOLUS
1000.0000 mL | Freq: Once | INTRAVENOUS | Status: AC
  Administered 2023-01-19: 1000 mL via INTRAVENOUS

## 2023-01-19 MED ORDER — MIDAZOLAM HCL (PF) 5 MG/ML IJ SOLN
INTRAMUSCULAR | Status: AC
  Filled 2023-01-19: qty 3

## 2023-01-19 MED ORDER — SODIUM CHLORIDE 0.9% IV SOLUTION: Freq: Once | INTRAVENOUS | Status: AC

## 2023-01-19 MED ORDER — PANTOPRAZOLE INFUSION (NEW) - SIMPLE MED
8.0000 mg/h | INTRAVENOUS | Status: AC
  Administered 2023-01-19 – 2023-01-21 (×8): 8 mg/h via INTRAVENOUS
  Filled 2023-01-19: qty 100
  Filled 2023-01-19 (×4): qty 80
  Filled 2023-01-19: qty 100
  Filled 2023-01-19 (×2): qty 80

## 2023-01-19 MED ORDER — MIDAZOLAM HCL 2 MG/2ML IJ SOLN
2.0000 mg | INTRAMUSCULAR | Status: DC | PRN
  Administered 2023-01-19: 2 mg via INTRAVENOUS
  Filled 2023-01-19 (×4): qty 2

## 2023-01-19 MED ORDER — SODIUM CHLORIDE 0.9% IV SOLUTION: Freq: Once | INTRAVENOUS | Status: DC

## 2023-01-19 MED ORDER — PANTOPRAZOLE 80MG IVPB - SIMPLE MED
80.0000 mg | Freq: Once | INTRAVENOUS | Status: AC
  Filled 2023-01-19: qty 100

## 2023-01-19 MED ORDER — SODIUM CHLORIDE (PF) 0.9 % IJ SOLN
PREFILLED_SYRINGE | INTRAMUSCULAR | Status: DC | PRN
  Administered 2023-01-19: 3 mL

## 2023-01-19 MED ORDER — LACTATED RINGERS IV SOLN: INTRAVENOUS | Status: DC

## 2023-01-19 MED ORDER — SODIUM CHLORIDE 0.9 % IV SOLN
250.0000 mL | INTRAVENOUS | Status: DC
  Administered 2023-01-26: 250 mL via INTRAVENOUS

## 2023-01-19 MED ORDER — HYDROMORPHONE HCL 1 MG/ML IJ SOLN
INTRAMUSCULAR | Status: AC
  Administered 2023-01-19: 1 mg via INTRAVENOUS
  Filled 2023-01-19: qty 2

## 2023-01-19 MED ORDER — TRAVASOL 10 % IV SOLN
INTRAVENOUS | Status: AC
  Filled 2023-01-19: qty 1122

## 2023-01-19 MED ORDER — CHLORHEXIDINE GLUCONATE CLOTH 2 % EX PADS: 6.0000 | MEDICATED_PAD | Freq: Once | CUTANEOUS | Status: AC

## 2023-01-19 MED ORDER — VASOPRESSIN 20 UNITS/100 ML INFUSION FOR SHOCK
0.0000 [IU]/min | INTRAVENOUS | Status: DC
  Administered 2023-01-19 – 2023-01-20 (×4): 0.03 [IU]/min via INTRAVENOUS
  Filled 2023-01-19 (×5): qty 100

## 2023-01-19 MED ORDER — NOREPINEPHRINE 4 MG/250ML-% IV SOLN
0.0000 ug/min | INTRAVENOUS | Status: DC
  Administered 2023-01-19: 30 ug/min via INTRAVENOUS
  Administered 2023-01-19: 2 ug/min via INTRAVENOUS
  Administered 2023-01-19: 40 ug/min via INTRAVENOUS
  Administered 2023-01-19: 32 ug/min via INTRAVENOUS
  Administered 2023-01-20: 14 ug/min via INTRAVENOUS
  Administered 2023-01-20: 16 ug/min via INTRAVENOUS
  Administered 2023-01-20 (×3): 40 ug/min via INTRAVENOUS
  Administered 2023-01-20: 15 ug/min via INTRAVENOUS
  Administered 2023-01-21: 9 ug/min via INTRAVENOUS
  Administered 2023-01-21: 14 ug/min via INTRAVENOUS
  Administered 2023-01-21: 5 ug/min via INTRAVENOUS
  Administered 2023-01-22: 8 ug/min via INTRAVENOUS
  Administered 2023-01-22: 3 ug/min via INTRAVENOUS
  Administered 2023-01-23: 2 ug/min via INTRAVENOUS
  Filled 2023-01-19 (×18): qty 250

## 2023-01-19 MED ORDER — FENTANYL CITRATE (PF) 100 MCG/2ML IJ SOLN
INTRAMUSCULAR | Status: AC
  Filled 2023-01-19: qty 4

## 2023-01-19 MED ORDER — PHENYLEPHRINE HCL-NACL 20-0.9 MG/250ML-% IV SOLN: 25.0000 ug/min | INTRAVENOUS | Status: DC

## 2023-01-19 MED ORDER — LACTATED RINGERS IV BOLUS
2000.0000 mL | Freq: Once | INTRAVENOUS | Status: AC
  Administered 2023-01-19: 2000 mL via INTRAVENOUS

## 2023-01-19 MED ORDER — LACTATED RINGERS IV BOLUS
500.0000 mL | Freq: Once | INTRAVENOUS | Status: AC
  Administered 2023-01-19: 500 mL via INTRAVENOUS

## 2023-01-19 MED ORDER — PHENYLEPHRINE HCL-NACL 20-0.9 MG/250ML-% IV SOLN
0.0000 ug/min | INTRAVENOUS | Status: DC
  Administered 2023-01-19: 20 ug/min via INTRAVENOUS
  Administered 2023-01-19: 140 ug/min via INTRAVENOUS
  Administered 2023-01-19 (×2): 150 ug/min via INTRAVENOUS
  Filled 2023-01-19 (×4): qty 250

## 2023-01-19 MED ORDER — HYDROMORPHONE HCL 1 MG/ML IJ SOLN
2.0000 mg | INTRAMUSCULAR | Status: DC | PRN
  Administered 2023-01-20: 2 mg via INTRAVENOUS
  Filled 2023-01-19: qty 2

## 2023-01-19 NOTE — Progress Notes (Incomplete)
eLink Physician-Brief Progress Note Patient Name: Bradley Dominguez DOB: 12-23-1955 MRN: [id number]   Date of Service  01/19/2023  HPI/Events of Note  Patient developed massive hematemesis and dropped his hemoglobin from 10-4.  Vasopressor requirements increased and patient became more more obtunded.  eICU Interventions  2 18-gauge IVs were obtained Vasopressor doses adjusted. NG tube was passed to evacuate the stomach. MTP initiated.  Received RBC, FFP and platelets PCC ordered Spoke with GI Consulted interventional radiology Spoke with intensivist at [facility] who arrived for intubation Patient going down for IR embolization     Intervention Category Major Interventions: Shock - evaluation and management  Carilyn Goodpasture 01/19/2023, 10:13 PM

## 2023-01-19 NOTE — Progress Notes (Signed)
Alerted by nurse about pt fall and actively vomiting blood Spoke with Dr Jeanella Anton from ED who responded to code  Changed type and screen from am to now/stat Stat cbc, bmet, mag Give 1u emergency release blood - spoke with blood bank- will give at least 1u blood before labs return Transfer pt to ICU Place NG tube to LIWS Keep 2u prbc ahead Consult CCM PPI infusion  Bradley Dominguez. Andrey Campanile, MD, FACS General, Bariatric, & Minimally Invasive Surgery [facility] Surgery,  A [facility]

## 2023-01-19 NOTE — Progress Notes (Signed)
Discussed with pt and wife that with sedation for the egd and sedation for intubation that that generally lowers the blood pressure and given his current BP he will likely need a vasopressor to support his BP.  Also may likely need ongoing blood products potentially.  But will be prepared to start a vasopressor. Will order phenylephrine to be available - but defer to CCM if they feel another agent is better.   [name]. Andrey Campanile, MD, FACS General, Bariatric, & Minimally Invasive Surgery [facility] Surgery,  A [facility]

## 2023-01-19 NOTE — Progress Notes (Signed)
PHARMACY - TOTAL PARENTERAL NUTRITION CONSULT NOTE   Indication: Intolerance to enteral feeding  Patient Measurements: Height: 6\' 1"  (185.4 cm) Weight: 69.2 kg (152 lb 8 oz) IBW/kg (Calculated) : 79.9 TPN AdjBW (KG): 68.1 Body mass index is 20.12 kg/m.   Assessment: Pt is a 2 yoM admitted on 4/8 with N/V, chronic partial SBO, history of multiple previous abdominal surgeries. Pt has very minimal PO intake with ongoing weight loss. Pharmacy consulted to manage TPN.   Glucose / Insulin: No history of DM. Goal CBG: <180 -CBG range: 95-140, serum gluc 191> does not correlate w/ CBGs. . No SSI required.  Electrolytes: CorrCa 7.8> 1 gm Ca gluc ordered by CCS, Mag 1.5 > 1 gm Mag ordered by CCS, Na 132 w/ 100/L in TPN Renal: SCr WNL/stable, BUN up to 61 Hepatic: LFTs, Tbili, Alk Phos, all WNL.  Intake / Output; MIVF:  -UOP: 1050 mL/24, emesis 4/13. LBM 4/12 -mIVF: NS at 50 mL/hr GI Imaging:  -4/4 UGI: Findings suggestive of chronic partial small bowel obstruction or ileus -4/9 CT Abd: Ileus vs high grade partial SBO GI Surgeries / Procedures:  -4/11 EGD: Gastric and small bowel dilation. Could have functional afferent loop syndrome or internal hernia 4/14 emergent bedside EGD:  oozing cratered gastric ulcer w/ oozing hemorrhage> inj w/ epi & clips placed -4/15: Anticipated ex lap  Central access: Double lumen PICC placed 4/8 TPN start date: 4/9  Nutritional Goals:  Goal TPN rate is 85 mL/hr (provides 112 g of protein and 2101 kcals per day)  RD Assessment: Estimated Needs Total Energy Estimated Needs: 2050-2250 Total Protein Estimated Needs: 100-115g Total Fluid Estimated Needs: 2.1L/day  Current Nutrition:  NPO  TPN Propofol @ 5 mcg/kg/min = 2.08 ml/hr - while intubated for EGD.  Now off for planned extubation   Plan:  Now: Ca gluc 1 gm per CCS, Mag 1 gm per CCS,  Give another 3 gm Mag  At 1800: continue TPN @ goal rate of 85 mL/hr Electrolytes in TPN: Increase Ca. Na  100 mEq/L, K 40 mEq/L, Ca 10 mEq/L, Mg 40mEq/L, and Phos 18 mmol/L GK:KDPTELM 1:2 Add standard MVI and trace elements to TPN Completed 5 days thiamine 4/13 Continue very Sensitive q8h SSI and adjust as needed - may be able to DC soon mIVF @ KVO Monitor TPN labs on Mon/Thurs  Herby Abraham, Pharm.D Use secure chat for questions 01/19/2023 10:09 AM

## 2023-01-19 NOTE — Progress Notes (Signed)
Lat entry: Dr. Andrey Campanile aware via phone that BP low at 89/64. No new orders received.

## 2023-01-19 NOTE — Progress Notes (Addendum)
Updated by Dr Chales Abrahams about EGD findings. Appreciate his assist  As expected pt is on Neo to maintain MAP while on sedation.   Discussed with ccm attending pros/cons extubation today vs leaving intubated for surgery in AM. He will evaluate the pt on rounds and go from there.   Dr Freida Busman has updated about clinical events overnight and as of now is planning on continuing with exp lap in AM contingent upon no further significant events.   I updated wife and family in waiting room   Pt needs additional type and screen to satisfy 2 t&C policy.  Can wait for that to come back to give the additional unit of blood I ordered a short time ago.  Would cont serial cbc/hgb checks.   [name]. Andrey Campanile, MD, FACS General, Bariatric, & Minimally Invasive Surgery [facility] Surgery,  A [facility]

## 2023-01-19 NOTE — ED Provider Notes (Signed)
Called to bedside for CODE BLUE.  Patient was unresponsive on the floor, covered in bloody emesis.  Patient was lifted back to the bed and he was initially minimally responsive but then was able to interact appropriately.  He has no complaints of pain.  He did have profuse emesis which was grossly bloody.  He appears to be protecting his airway at this time.  NGT placed due to profuse emesis.  He will need ongoing resuscitation in the intensive care unit.  Discussed with Dr. Andrey Campanile with general surgery.   Tilden Fossa, MD 01/19/23 [phone]

## 2023-01-19 NOTE — Progress Notes (Signed)
Increasing pressor requirement  Request for A-Line

## 2023-01-19 NOTE — Progress Notes (Signed)
eLink Physician-Brief Progress Note Patient Name: Bradley Dominguez DOB: 1956/07/18 MRN: [id number]   Date of Service  01/19/2023  HPI/Events of Note  Brief new admit: Transferred from floor to ICU. GI bleeding.  Seen by GI on 17 th: 67 yo male with chronic intermittent bloating, presenting with worsening symptoms, PO intolerance and dehydration. - EGD and upper GI confirm duodenal obstruction. 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 Plan for surgery on Monday 4/15 for exploratory laparotomy, lysis of adhesions, and duodenojejunal bypass with dr Blanchie Dessert; Sinus tachy at 125. Has NG tube to LWIS per surgeon.  Protecting airways.  Data: Reviewed. Hg at 8.3 from earlier 9.3 Plt 128 KUB pending to do  Discussed with RN Bed side MD ordering meds as below.    eICU Interventions  NPO, asp precautions Follow fibrinogen levels Transfuse PRBC if re bleeding or dropping Hg. GI/Gen surgery to see/follow up.going on protonix drip. Follow Hg closely, keep MAP > 65.  LWIS. SCD Ssi goals < 180. On TPN-cachexia/PEM        Intervention Category Major Interventions: Other: (Bowel obstruction) Evaluation Type: New Patient Evaluation  Ranee Gosselin 01/19/2023, 3:13 AM

## 2023-01-19 NOTE — Procedures (Signed)
Intubation Procedure Note  Yohanan Ferroni  [id number]  02/16/1956  Date:01/19/23  Time:6:35 AM   Provider Performing:Yukiko Minnich Gaynell Face    Procedure: Intubation (31500)  Indication(s) Respiratory Failure  Consent Risks of the procedure as well as the alternatives and risks of each were explained to the patient and/or caregiver.  Consent for the procedure was obtained and is signed in the bedside chart   Anesthesia Etomidate, Versed, and dilaudid, see MAR   Time Out Verified patient identification, verified procedure, site/side was marked, verified correct patient position, special equipment/implants available, medications/allergies/relevant history reviewed, required imaging and test results available.   Sterile Technique Usual hand hygeine, masks, and gloves were used   Procedure Description Patient positioned in bed supine.  Sedation given as noted above.  Patient was intubated with endotracheal tube using Glidescope.  View was Grade 1 full glottis .  Number of attempts was 1.  Colorimetric CO2 detector was consistent with tracheal placement.   Complications/Tolerance None; patient tolerated the procedure well. Chest X-ray is ordered to verify placement.   EBL Minimal   Specimen(s) None

## 2023-01-19 NOTE — Progress Notes (Addendum)
Heard a loud noise from pt room around 0215. Went in and checked pt on the floor with head leaning against the wall(noted wall dented). Pt noted unresponsive,also noted large amt of bloody vomit and pt had  bowel and bladder incontinence as well. No bleeding on his head. Called staff for help and called code blue. Few seconds later pt was responsive.Code team came. Pt vomited large amt of blood multiple times. Assisted pt to bed. Dr. Andrey Campanile notified regarding pt. incident and he spoke with the Code team Dr. Madilyn Hook.Ng tube was placed per order. Wife was notified by [facility] regarding pt condition. Pt was transferred to ICU [id number]. All belongings sent with pt. Report given to ICU nurse.

## 2023-01-19 NOTE — Progress Notes (Signed)
Bedside EGD performed. Vital signs and sedation monitored by ICU RN.

## 2023-01-19 NOTE — Progress Notes (Signed)
   01/19/23 0300  What Happened  Was fall witnessed? No  Was patient injured? No  Patient found on floor  Found by Staff-comment (Nurse)  Stated prior activity bathroom-unassisted  Provider Notification  Provider Name/Title Dr. Andrey Campanile  Date Provider Notified 01/19/23  Time Provider Notified 0300  Method of Notification Call  Notification Reason Fall  Provider response See new orders  Date of Provider Response 01/19/23  Time of Provider Response 0300  Follow Up  Family notified Yes - comment  Time family notified 0300 [facility] Lorie spoke with wife)  Simple treatment Other (comment) (assess the pt.)  Progress note created (see row info) Yes  Adult Fall Risk Assessment  Risk Factor Category (scoring not indicated) Fall has occurred during this admission (document High fall risk)  Age 67  Fall History: Fall within 6 months prior to admission 0  Elimination; Bowel and/or Urine Incontinence 2  Elimination; Bowel and/or Urine Urgency/Frequency 0  Medications: includes PCA/Opiates, Anti-convulsants, Anti-hypertensives, Diuretics, Hypnotics, Laxatives, Sedatives, and Psychotropics 0  Patient Care Equipment 2  Mobility-Assistance 2  Mobility-Gait 2  Mobility-Sensory Deficit 0  Altered awareness of immediate physical environment 0  Impulsiveness 0  Lack of understanding of one's physical/cognitive limitations 0  Total Score 9  Adult Fall Risk Interventions  Required Bundle Interventions *See Row Information* High fall risk - low, moderate, and high requirements implemented  Additional Interventions PT/OT need assessed if change in mobility from baseline  Screening for Fall Injury Risk (To be completed on HIGH fall risk patients) - Assessing Need for Floor Mats  Risk For Fall Injury- Criteria for Floor Mats None identified - No additional interventions needed (transferred pt to ICU)  Vitals  BP 102/62  MAP (mmHg) 73  Pulse Rate (!) 109  Resp 17  Oxygen Therapy  SpO2 100 %  O2  Device Room Air  Pain Assessment  Pain Scale 0-10  Pain Score 0

## 2023-01-19 NOTE — Progress Notes (Signed)
     Progress Note    ASSESSMENT AND PLAN:   UGI bleed with shock- ?MW tear, from Bx site or ischemic ulcers (in the blind distended "afferent loop". Hb 9.3 to 8.6 s/p 1U  Plan: -EGD after intubation for airway protection.  I have discussed risks and benefits in great detail with the patient's wife.  All the questions were answered.  Appreciate CCM (Dr Gaynell Face) assistance. -Trend CBC -IV Protonix -IV pressors     SUBJECTIVE   Started having hematemesis early this morning with hypotension NG tube inserted Hb (adm 11) to 9.3 to 8.6 s/p 1U No abdominal pain or distention    OBJECTIVE:     Vital signs in last 24 hours: Temp:  [97 F (36.1 C)-98.6 F (37 C)] 97 F (36.1 C) (04/14 0330) Pulse Rate:  [81-117] 105 (04/14 0335) Resp:  [13-19] 19 (04/14 0335) BP: (74-102)/(50-64) 74/50 (04/14 0330) SpO2:  [95 %-100 %] 97 % (04/14 0335) Last BM Date : 01/18/23 General:   Alert, thin built.  NG tube with dark bloody aspirate EENT:  Normal hearing, non icteric sclera, conjunctive pink.  Heart:  Regular rate and rhythm; no murmur.  No lower extremity edema   Pulm: Normal respiratory effort, lungs CTA bilaterally without wheezes or crackles. Abdomen:  Soft, nondistended, mild epigastric tenderness.  Normal bowel sounds,.       Neurologic:  Alert and  oriented x4;  grossly normal neurologically. Psych:  Pleasant, cooperative.  Normal mood and affect.   Intake/Output from previous day: 04/13 0701 - 04/14 0700 In: 2378.8 [I.V.:1808.4; Blood:268; IV Piggyback:302.4] Out: 1050 [Urine:1050] Intake/Output this shift: Total I/O In: 1389.7 [I.V.:819.3; Blood:268; IV Piggyback:302.4] Out: -   Lab Results: Recent Labs    01/18/23 2246 01/19/23 0305  WBC 8.6 10.0  HGB 9.3* 8.6*  HCT 29.1* 26.8*  PLT 112* 128*   BMET Recent Labs    01/17/23 0842 01/18/23 0330 01/19/23 0305  NA 136 136 132*  K 4.3 4.5 4.4  CL 111 105 108  CO2 21* 25 20*  GLUCOSE 109* 88 191*  BUN  19 19 61*  CREATININE 0.94 0.95 1.05  CALCIUM 7.8* 8.4* 7.4*   LFT No results for input(s): "PROT", "ALBUMIN", "AST", "ALT", "ALKPHOS", "BILITOT", "BILIDIR", "IBILI" in the last 72 hours. PT/INR Recent Labs    01/19/23 0305  LABPROT 16.3*  INR 1.3*   Hepatitis Panel No results for input(s): "HEPBSAG", "HCVAB", "HEPAIGM", "HEPBIGM" in the last 72 hours.  DG Abd 1 View  Result Date: 01/19/2023 CLINICAL DATA:  NG tube placement EXAM: ABDOMEN - 1 VIEW COMPARISON:  CT abdomen and pelvis 494 FINDINGS: Gas-filled loops of dilated bowel in the left upper abdomen are redemonstrated. Enteric tube tip and side-port in the stomach. IMPRESSION: Enteric tube tip and side-port in the stomach. Dilated loops of bowel in the upper abdomen. Electronically Signed   By: Minerva Fester M.D.   On: 01/19/2023 03:25     Principal Problem:   Bowel obstruction Active Problems:   Protein-calorie malnutrition, severe   Gastrointestinal hemorrhage associated with acute gastritis     LOS: 6 days     Edman Circle, MD 01/19/2023, 6:12 AM [facility] [phone]

## 2023-01-19 NOTE — Progress Notes (Signed)
Spoke with Delorise Shiner NP with CCM for consult for GI bleed

## 2023-01-19 NOTE — Procedures (Signed)
Arterial Catheter Insertion Procedure Note  Kaymon Licea  [id number]  18-Oct-1955  Date:01/19/23  Time:7:09 PM    Provider Performing: Darolyn Rua    Procedure: Insertion of Arterial Line (93235) without US guidance  Indication(s) Blood pressure monitoring and/or need for frequent ABGs  Consent Risks of the procedure as well as the alternatives and risks of each were explained to the patient and/or caregiver.  Consent for the procedure was obtained and is signed in the bedside chart  Anesthesia None   Time Out Verified patient identification, verified procedure, site/side was marked, verified correct patient position, special equipment/implants available, medications/allergies/relevant history reviewed, required imaging and test results available.   Sterile Technique Maximal sterile technique including full sterile barrier drape, hand hygiene, sterile gown, sterile gloves, mask, hair covering, sterile ultrasound probe cover (if used).   Procedure Description Area of catheter insertion was cleaned with chlorhexidine and draped in sterile fashion. Without real-time ultrasound guidance an arterial catheter was placed into the left radial artery.  Appropriate arterial tracings confirmed on monitor.     Complications/Tolerance None; patient tolerated the procedure well.   EBL Minimal   Specimen(s) None

## 2023-01-19 NOTE — Op Note (Signed)
[facility] Patient Name: Bradley Dominguez Procedure Date: 01/19/2023 MRN: [id number] Attending MD: Lynann Bologna , MD, [id number] Date of Birth: 1955-11-21 CSN: [id number] Age: 67 Admit Type: Inpatient Procedure:                Upper GI endoscopy Indications:              Hematemesis Providers:                Lynann Bologna, MD, Zoe Lan, RN, Brion Aliment,                            Technician Referring MD:             Ree Kida. Allen Medicines:                General Anesthesia Complications:            No immediate complications. Estimated Blood Loss:     Estimated blood loss: none. Procedure:                Pre-Anesthesia Assessment:                           - Prior to the procedure, a History and Physical                            was performed, and patient medications and                            allergies were reviewed. The patient's tolerance of                            previous anesthesia was also reviewed. The risks                            and benefits of the procedure and the sedation                            options and risks were discussed with the patient.                            All questions were answered, and informed consent                            was obtained. Prior Anticoagulants: The patient has                            taken no anticoagulant or antiplatelet agents. ASA                            Grade Assessment: III - A patient with severe                            systemic disease. After reviewing the risks and  benefits, the patient was deemed in satisfactory                            condition to undergo the procedure.                           After obtaining informed consent, the endoscope was                            passed under direct vision. Throughout the                            procedure, the patient's blood pressure, pulse, and                            oxygen saturations were  monitored continuously. The                            GIF-H190 ([id number]) Olympus endoscope was introduced                            through the mouth, and advanced to the proximal                            jejunum. The upper GI endoscopy was accomplished                            without difficulty. The patient tolerated the                            procedure well. Scope In: Scope Out: Findings:      The examined esophagus was normal.      One oozing cratered gastric ulcer with oozing hemorrhage (Forrest Class       Ib) was found at the anastomosis (most likely from the previous biopsy       site). The lesion was 4 mm in largest dimension. Area was successfully       injected with 3 mL of a 0.1 mg/mL solution of epinephrine for       hemostasis. To prevent bleeding post-intervention, two hemostatic clips       were successfully placed (MR conditional) followed by puraStat       application (3 cc). There was no bleeding at the end of the procedure.      Hematin (altered blood/coffee-ground-like material) was found in the       gastric body.      Hematin (altered blood/coffee-ground-like material) with few clots was       found in the jejunum. Impression:               - Oozing gastric ulcer with oozing hemorrhage                            (Forrest Class Ib). Injected/cips x 2/pureStat                            application. Moderate Sedation:  Not Applicable - Patient had care per Anesthesia. Recommendation:           - Observe patient in PACU for ongoing care.                           - Keep NG out. If any N/V, then may have to                            reinsert.                           - Trend CBC. Transfuse as required                           - IV Protonix drip x 72hrs, then BID                           - Expect melena for few days.                           - If any further active bleeding, can consider                            repeat EGD vs IR embolization                            - The findings and recommendations were discussed                            with the patient's family.                           - The findings and recommendations were discussed                            with the surgeon. Procedure Code(s):        --- Professional ---                           [phone], Esophagogastroduodenoscopy, flexible,                            transoral; with control of bleeding, any method Diagnosis Code(s):        --- Professional ---                           K25.4, Chronic or unspecified gastric ulcer with                            hemorrhage                           K92.2, Gastrointestinal hemorrhage, unspecified                           K92.0, Hematemesis CPT copyright 2022 [employer]. All rights reserved. The  codes documented in this report are preliminary and upon coder review may  be revised to meet current compliance requirements. Lynann Bologna, MD 01/19/2023 8:12:39 AM This report has been signed electronically. Number of Addenda: 0

## 2023-01-19 NOTE — Progress Notes (Addendum)
Patient ID: Bradley Dominguez, male   DOB: Mar 06, 1956, 67 y.o.   MRN: [id number] Follow up - Trauma Critical Care   Patient Details:    Bradley Dominguez is an 67 y.o. male.  Lines/tubes : PICC Double Lumen 01/13/23 Right Basilic 39 cm 0 cm (Active)  Indication for Insertion or Continuance of Line Administration of hyperosmolar/irritating solutions (i.e. TPN, Vancomycin, etc.) 01/18/23 1821  Exposed Catheter (cm) 0 cm 01/16/23 1836  Site Assessment Clean, Dry, Intact 01/18/23 2034  Lumen #1 Status Infusing 01/18/23 2034  Lumen #2 Status Infusing 01/18/23 2034  Dressing Type Transparent;Securing device 01/18/23 2034  Dressing Status Antimicrobial disc in place;Clean, Dry, Intact 01/18/23 1821  Safety Lock Not Applicable 01/17/23 1757  Line Care Lumen 2 tubing changed;Lumen 2 cap changed;Connections checked and tightened 01/18/23 1821  Line Adjustment (NICU/IV Team Only) No 01/18/23 1227  Dressing Intervention New dressing 01/13/23 2129  Dressing Change Due 01/20/23 01/18/23 1821    Microbiology/Sepsis markers: No results found for this or any previous visit.  Anti-infectives:  Anti-infectives (From admission, onward)    Start     Dose/Rate Route Frequency Ordered Stop   01/19/23 0600  cefTRIAXone (ROCEPHIN) 2 g in sodium chloride 0.9 % 100 mL IVPB       See Hyperspace for full Linked Orders Report.   2 g 200 mL/hr over 30 Minutes Intravenous On call to O.R. 01/19/23 0350 01/20/23 0559   01/19/23 0350  metroNIDAZOLE (FLAGYL) IVPB 500 mg       See Hyperspace for full Linked Orders Report.   500 mg 100 mL/hr over 60 Minutes Intravenous Every 8 hours 01/19/23 0350         Best Practice/Protocols:  VTE Prophylaxis: Mechanical GI Prophylaxis: PPI infusion GI bleed protocol  Consults:  Gi - Dr Chales Abrahams CCM - Dr Gaynell Face   Studies:    Events: Fall, hematemesis Subjective:    Overnight Issues:  As documented earlier this evening was alerted by nursing staff patient had bloody  emesis and fell and a code was called.  Nurse alerted me and I spoke with the EDP who responded to the code over the phone  Patient is now in ICU.  He is mentating appropriately.  He denies any abdominal pain.  NG-tube has been placed Objective:  Vital signs for last 24 hours: Temp:  [97 F (36.1 C)-98.6 F (37 C)] 97 F (36.1 C) (04/14 0330) Pulse Rate:  [65-117] 105 (04/14 0335) Resp:  [13-19] 19 (04/14 0335) BP: (74-102)/(50-68) 74/50 (04/14 0330) SpO2:  [95 %-100 %] 97 % (04/14 0335) Weight:  [69.2 kg] 69.2 kg (04/13 0500)  Hemodynamic parameters for last 24 hours:    Intake/Output from previous day: 04/13 0701 - 04/14 0700 In: 1607.5 [I.V.:1414.1; IV Piggyback:193.4] Out: 1050 [Urine:1050]  Intake/Output this shift: Total I/O In: 618.4 [I.V.:425; IV Piggyback:193.4] Out: -   Vent settings for last 24 hours:    Physical Exam:  General: alert and no respiratory distress Neuro: alert, oriented, and nonfocal exam Resp: clear to auscultation bilaterally CVS: regular rate and rhythm, S1, S2 normal, no murmur, click, rub or gallop GI: soft, nontender, BS WNL, no r/g Skin: no rash; very pale Extremities: no edema, no erythema, pulses WNL  Results for orders placed or performed during the hospital encounter of 01/13/23 (from the past 24 hour(s))  Glucose, capillary     Status: None   Collection Time: 01/18/23  6:22 AM  Result Value Ref Range   Glucose-Capillary 84 70 - 99 mg/dL  Glucose, capillary     Status: None   Collection Time: 01/18/23  4:39 PM  Result Value Ref Range   Glucose-Capillary 95 70 - 99 mg/dL  CBC     Status: Abnormal   Collection Time: 01/18/23 10:46 PM  Result Value Ref Range   WBC 8.6 4.0 - 10.5 K/uL   RBC 2.88 (L) 4.22 - 5.81 MIL/uL   Hemoglobin 9.3 (L) 13.0 - 17.0 g/dL   HCT 16.1 (L) 09.6 - 04.5 %   MCV 101.0 (H) 80.0 - 100.0 fL   MCH 32.3 26.0 - 34.0 pg   MCHC 32.0 30.0 - 36.0 g/dL   RDW 40.9 81.1 - 91.4 %   Platelets 112 (L) 150 - 400  K/uL   nRBC 0.0 0.0 - 0.2 %  Glucose, capillary     Status: Abnormal   Collection Time: 01/19/23 12:25 AM  Result Value Ref Range   Glucose-Capillary 120 (H) 70 - 99 mg/dL  Type and screen [facility]     Status: None (Preliminary result)   Collection Time: 01/19/23  2:33 AM  Result Value Ref Range   ABO/RH(D) PENDING    Antibody Screen PENDING    Sample Expiration 01/22/2023,2359    Unit Number [id number]    Blood Component Type RBC LR PHER1    Unit division 00    Status of Unit ISSUED    Unit tag comment EMERGENCY RELEASE    Transfusion Status      OK TO TRANSFUSE Performed at [facility], [address] [place], [place] [address]    Crossmatch Result PENDING    Unit Number [id number]    Blood Component Type RED CELLS,LR    Unit division 00    Status of Unit ALLOCATED    Unit tag comment EMERGENCY RELEASE    Transfusion Status OK TO TRANSFUSE    Crossmatch Result PENDING    Unit Number [id number]    Blood Component Type RBC LR PHER2    Unit division 00    Status of Unit ALLOCATED    Unit tag comment EMERGENCY RELEASE    Transfusion Status OK TO TRANSFUSE    Crossmatch Result PENDING   Prepare RBC     Status: None   Collection Time: 01/19/23  2:39 AM  Result Value Ref Range   Order Confirmation      ORDER PROCESSED BY BLOOD BANK Performed at [facility], [address], [place], [place] [address]   CBC     Status: Abnormal   Collection Time: 01/19/23  3:05 AM  Result Value Ref Range   WBC 10.0 4.0 - 10.5 K/uL   RBC 2.63 (L) 4.22 - 5.81 MIL/uL   Hemoglobin 8.6 (L) 13.0 - 17.0 g/dL   HCT 95.6 (L) 38.7 - 56.4 %   MCV 101.9 (H) 80.0 - 100.0 fL   MCH 32.7 26.0 - 34.0 pg   MCHC 32.1 30.0 - 36.0 g/dL   RDW 33.2 95.1 - 88.4 %   Platelets 128 (L) 150 - 400 K/uL   nRBC 0.0 0.0 - 0.2 %  Protime-INR     Status: Abnormal   Collection Time: 01/19/23  3:05 AM  Result Value Ref Range   Prothrombin Time  16.3 (H) 11.4 - 15.2 seconds   INR 1.3 (H) 0.8 - 1.2  Basic metabolic panel     Status: Abnormal   Collection Time: 01/19/23  3:05 AM  Result Value Ref Range   Sodium  132 (L) 135 - 145 mmol/L   Potassium 4.4 3.5 - 5.1 mmol/L   Chloride 108 98 - 111 mmol/L   CO2 20 (L) 22 - 32 mmol/L   Glucose, Bld 191 (H) 70 - 99 mg/dL   BUN 61 (H) 8 - 23 mg/dL   Creatinine, Ser 1.61 0.61 - 1.24 mg/dL   Calcium 7.4 (L) 8.9 - 10.3 mg/dL   GFR, Estimated >09 >60 mL/min   Anion gap 4 (L) 5 - 15  Magnesium     Status: Abnormal   Collection Time: 01/19/23  3:05 AM  Result Value Ref Range   Magnesium 1.5 (L) 1.7 - 2.4 mg/dL  Fibrinogen     Status: Abnormal   Collection Time: 01/19/23  3:05 AM  Result Value Ref Range   Fibrinogen 201 (L) 210 - 475 mg/dL    Assessment & Plan: Present on Admission:  Bowel obstruction Acute GI bleed Acute on chronic blood loss anemia Remote history of intestinal surgery Proximal bowel obstruction Hypomagnesia Hypocalcemic Mild thrombocytopenia   Acute blood loss anemia-hemoglobin has drifted down to 8.6 from 9.3 earlier in the evening.  Admission hemoglobin was around 11.  Patient is getting 1 unit of emergency release blood.  He has a type and screen that has been in process.  Keep 2 units ahead.  Serial hemoglobin; coags okay so far  GI bleed-unknown etiology.  Could be from recent endoscopy maneuvers versus severe gastritis versus ulcer.  Most recent EGD on April 11 showed no obvious ulcer.  There is concern for a nonfunctioning proximal stomach intestinal bypass.  Needs upper endoscopy to evaluate for upper GI bleeding source.  Discussed with Dr. Chales Abrahams who will plan urgent upper endoscopy shortly; n.p.o., continue NG tube to low intermittent wall suction; Protonix bolus followed by twice daily therapy  Hypomagnesia-replace magnesium Hypocalcemic-will give 1 g of calcium gluconate Mild thrombocytopenia-monitor platelet count  Potential hemorrhagic shock  -patient's blood pressure has been a systolic of around 90 ever since admission.  When nursing staff called me yesterday evening with blood pressure around 88 systolic I reviewed  his vitals since admission and they have been hovering around 90 to mid 90s systolic.  Currently his blood pressure is 85 systolic with a heart rate of 94.    Updated wife at bedside.  Discussed the typical workup and management of a GI bleed.  Discussed with her and patient that sometimes patients are electively intubated for urgent procedures such as urgent endoscopies to protect the airway.  Did discuss with them that patient may remain intubated at the conclusion of the procedure.  Discussed that EGD may be nondiagnostic but it does have the advantage of being both diagnostic and potentially therapeutic.  If nondiagnostic then we will proceed to next steps.  Discussed why exploratory surgery is too soon since we do not know the source at this point. Pt and wife have agreed with blood transfusions as needed. Told them we would update Dr Freida Busman later today.    LOS: 6 days   Additional comments:I reviewed the patient's new clinical lab test results. , I reviewed the patients new imaging test results; reviewed CT from 01/14/23; reviewed Dr Eliot Ford outpt clinic notes x 2 , I reviewed the patient's other test results. Labs since admission, I have discussed and reviewed with family members patient's, and I discussed the patient's clinical history and current condition  with Dr. Sherre Poot and Dr Gaynell Face and CCM APP. Managed pt's GI bleed with transfer to  ICU, emergent blood transfusion, PPI infusion, coordinating care  Critical Care Total Time*: 2 Hours  [name]. Andrey Campanile, MD, FACS General, Bariatric, & Minimally Invasive Surgery [facility],  A [facility] Use [email or web address] to contact on call provider  01/19/2023  *Care during the described time interval was provided by me. I have reviewed this patient's available  data, including medical history, events of note, physical examination and test results as part of my evaluation.

## 2023-01-19 NOTE — Progress Notes (Signed)
NAME:  Bradley Dominguez, MRN:  [id number], DOB:  [birth year], LOS: 6 ADMISSION DATE:  01/13/2023, CONSULTATION DATE: 01/19/2023 REFERRING MD: Dr. Freida Busman, CHIEF COMPLAINT: GI bleed  History of Present Illness:  67 yo M HF, chronic partial small bowel obstruction and multiple previous abd surgeries  who was admitted to [facility] 4/8 from home w n/v/bloating. GI consult 4/10, underwent EGD 4/11 -- concerning for duodenal obstruction. Plan for 4/15 ex lap LOA duodenojejunal bypass.    On 4/14 started vomiting blood, dark red and clots noted. BP on lower end but baseline BP with systolics in 80's to low 90's so not far. Repeat hgb in 8's from 9. Denies pain or nausea, just fatigued. GI reconsulted and considering repeat endoscopy   PCCM consulted   Pertinent  Medical History   Past Medical History:  Diagnosis Date   Arthritis    HANDS   Bowel obstruction    CHF (congestive heart failure) 11/26/2018   Elevated brain natriuretic peptide (BNP) level 11/25/2018   Family history of adverse reaction to anesthesia    FATHER SLOW TO AWAKEN, MOTHER POST OP N/V   Frequent UTI    History of colon polyps 2009   History of shingles    Plantar fasciitis    Prostate hypertrophy    Vitamin B 12 deficiency      Significant Hospital Events: Including procedures, antibiotic start and stop dates in addition to other pertinent events   4/8 admit 4/11 EGD -- duodenal obstruction 4/14 vomiting blood, hypotensive pccm consult    Interim History / Subjective:  Transfused Patient requiring propofol for sedation, associated hypotension  Objective   Blood pressure (!) 77/54, pulse 71, temperature (!) 97 F (36.1 C), temperature source Rectal, resp. rate (!) 32, height 6\' 1"  (1.854 m), weight 69.2 kg, SpO2 100 %.    Vent Mode: PRVC FiO2 (%):  [40 %-100 %] 40 % Set Rate:  [18 bmp] 18 bmp Vt Set:  [630 mL] 630 mL PEEP:  [5 cmH20] 5 cmH20 Plateau Pressure:  [10 cmH20-14 cmH20] 14 cmH20   Intake/Output Summary  (Last 24 hours) at 01/19/2023 0955 Last data filed at 01/19/2023 0800 Gross per 24 hour  Intake 2624.96 ml  Output 1050 ml  Net 1574.96 ml   Filed Weights   01/16/23 0931 01/17/23 0500 01/18/23 0500  Weight: 68.1 kg 68.3 kg 69.2 kg    Examination: General: Middle-age, does not appear to be in distress, currently sedated HENT: Moist oral mucosa, endotracheal tube in place Lungs: Clear breath sounds Cardiovascular: S1-S2 appreciated Abdomen: Soft, bowel sounds appreciated Extremities: No clubbing, no edema Neuro: Sedated GU:   Resolved Hospital Problem list     Assessment & Plan:  Patient with GI bleed Duodenal obstruction -Nonfunctioning prior GJ bypass -Definitive plan is for surgery 01/20/2023  Hemorrhagic shock Anemia Thrombocytopenia -Had a EGD 4/11 -Had EGD 4/14-see associated report  Protein calorie malnutrition -On TPN  Balance between continuing need for pressors and sedation Patient was intubated this morning for endoscopy, by my assessment to tolerate extubation Goal will be to try and wean and extubate today  Was discussed with Dr. Andrey Campanile today  Best Practice (right click and "Reselect all SmartList Selections" daily)   Diet/type: TPN DVT prophylaxis: SCD GI prophylaxis: PPI Lines: Central line, PICC Foley:  N/A Code Status:  full code Last date of multidisciplinary goals of care discussion [Per primary]  Labs   CBC: Recent Labs  Lab 01/13/23 1522 01/18/23 2246 01/19/23 0305 01/19/23 0539  WBC 6.6 8.6 10.0 10.7*  HGB 11.9* 9.3* 8.6* 8.8*  HCT 34.9* 29.1* 26.8* 26.9*  MCV 93.3 101.0* 101.9* 106.3*  PLT 156 112* 128* 105*  100*    Basic Metabolic Panel: Recent Labs  Lab 01/14/23 0406 01/15/23 0433 01/16/23 0333 01/17/23 0842 01/18/23 0330 01/19/23 0305  NA 139 133* 136 136 136 132*  K 2.9* 3.1* 3.7 4.3 4.5 4.4  CL 115* 113* 114* 111 105 108  CO2 17* 16* 17* 21* 25 20*  GLUCOSE 104* 104* 95 109* 88 191*  BUN 35* 24* 61*  CREATININE 1.28* 1.08 0.99 0.94 0.95 1.05  CALCIUM 7.0* 6.8* 7.5* 7.8* 8.4* 7.4*  MG 1.6* 2.2 1.8 1.7 2.0 1.5*  PHOS 2.3* 2.1* 1.7* 1.9* 2.6  --    GFR: Estimated Creatinine Clearance: 67.7 mL/min (by C-G formula based on SCr of 1.05 mg/dL). Recent Labs  Lab 01/13/23 1522 01/18/23 2246 01/19/23 0305 01/19/23 0633  WBC 6.6 8.6 10.0 10.7*    Liver Function Tests: Recent Labs  Lab 01/13/23 1522 01/16/23 0333  AST 23 19  ALT 26 24  ALKPHOS 59 60  BILITOT 1.0 0.9  PROT 5.9* 5.6*  ALBUMIN 3.7 3.5   No results for input(s): "LIPASE", "AMYLASE" in the last 168 hours. No results for input(s): "AMMONIA" in the last 168 hours.  ABG    Component Value Date/Time   PHART 7.4 01/19/2023 0820   PCO2ART 36 01/19/2023 0820   PO2ART 446 (H) 01/19/2023 0820   HCO3 22.4 01/19/2023 0820   ACIDBASEDEF 2.3 (H) 01/19/2023 0820   O2SAT 98.2 01/19/2023 0820     Coagulation Profile: Recent Labs  Lab 01/19/23 0305 01/19/23 0633  INR 1.3* 1.4*    Cardiac Enzymes: No results for input(s): "CKTOTAL", "CKMB", "CKMBINDEX", "TROPONINI" in the last 168 hours.  HbA1C: No results found for: "HGBA1C"  CBG: Recent Labs  Lab 01/18/23 0004 01/18/23 0622 01/18/23 1639 01/19/23 0025 01/19/23 0810  GLUCAP 104* 84 95 120* 140*    Review of Systems:   Sedated, unable to provide history  Past Medical History:  He,  has a past medical history of Arthritis, Bowel obstruction, CHF (congestive heart failure) (11/26/2018), Elevated brain natriuretic peptide (BNP) level (11/25/2018), Family history of adverse reaction to anesthesia, Frequent UTI, History of colon polyps (2009), History of shingles, Plantar fasciitis, Prostate hypertrophy, and Vitamin B 12 deficiency.   Surgical History:   Past Surgical History:  Procedure Laterality Date   APPENDECTOMY     CHOLECYSTECTOMY     COLONSCOPY  AGE 72   WITH POLYP REMOVED   SMALL INTESTINE SURGERY     1979   TRANSURETHRAL RESECTION OF  PROSTATE N/A 09/22/2017   Procedure: TRANSURETHRAL RESECTION OF THE PROSTATE (TURP);  Surgeon: Malen Gauze, MD;  Location: [facility];  Service: Urology;  Laterality: N/A;     Social History:   reports that he has never smoked. He has never used smokeless tobacco. He reports that he does not drink alcohol and does not use drugs.   Family History:  His family history includes Breast cancer in his mother; Diabetes in an other family member; Heart attack in his father. There is no history of Colon cancer, Esophageal cancer, Stomach cancer, Colon polyps, or Rectal cancer.   Allergies No Known Allergies   The patient is critically ill with multiple organ systems failure and requires high complexity decision making for assessment and support, frequent evaluation and titration of therapies, application  of advanced monitoring technologies and extensive interpretation of multiple databases. Critical Care Time devoted to patient care services described in this note independent of APP/resident time (if applicable)  is 30 minutes.   Virl Diamond MD  Pulmonary Critical Care Personal pager: See Amion If unanswered, please page CCM On-call: #[phone]

## 2023-01-19 NOTE — Consult Note (Signed)
NAME:  Bradley Dominguez, MRN:  [id number], DOB:  January 02, 1956, LOS: 6 ADMISSION DATE:  01/13/2023, CONSULTATION DATE:  01/19/23 REFERRING MD:  Andrey Campanile - [facility], CHIEF COMPLAINT:  GIB   History of Present Illness:  67 yo M HF, chronic partial small bowel obstruction and multiple previous abd surgeries  who was admitted to [facility] 4/8 from home w n/v/bloating. GI consult 4/10, underwent EGD 4/11 -- concerning for duodenal obstruction. Plan for 4/15 ex lap LOA duodenojejunal bypass.   On 4/14 started vomiting blood, dark red and clots noted. BP on lower end but baseline BP with systolics in 80's to low 90's so not far. Repeat hgb in 8's from 9. Denies pain or nausea, just fatigued. GI reconsulted and considering repeat endoscopy  PCCM consulted   Pertinent  Medical History  Chronic partial SBO  Significant Hospital Events: Including procedures, antibiotic start and stop dates in addition to other pertinent events   4/8 admit 4/11 EGD -- duodenal obstruction 4/14 vomiting blood, hypotensive pccm consult  Interim History / Subjective:  [facility]S has ordered 1 PRBC emergent release   Objective   Blood pressure (!) 77/55, pulse 98, temperature 97.9 F (36.6 C), temperature source Oral, resp. rate 14, height  (1.854 m), weight 69.2 kg, SpO2 98 %.        Intake/Output Summary (Last 24 hours) at 01/19/2023 0306 Last data filed at 01/19/2023 0000 Gross per 24 hour  Intake 2926.74 ml  Output 2400 ml  Net 526.74 ml   Filed Weights   01/16/23 0931 01/17/23 0500 01/18/23 0500  Weight: 68.1 kg 68.3 kg 69.2 kg    Examination: General: chronically ill appearing male resting in bed HENT: ncat, mm dry and pale, eomi, perrla Lungs: ctab Cardiovascular: rrr Abdomen: soft nt/nd bs + Extremities: no c/c/e Neuro: drowsy but arousable. No focal deficits GU: deferred  Resolved Hospital Problem list     Assessment & Plan:   GIB Duodenal obstruction; nonfunctioning prior GJ bypass  Hemorrhagic shock   Anemia Thrombocytopenia -EGD 4/11  P -T&S -1 PRBC ordered emergently, further product pending labs unless hemodynamics necessitate  -will check INR fibrinogen cbc   Best Practice (right click and "Reselect all SmartList Selections" daily)   Diet/type: TPN DVT prophylaxis: SCD GI prophylaxis: N/A Lines: Central line Foley:  N/A Code Status:  full code Last date of multidisciplinary goals of care discussion [--]  Labs   CBC: Recent Labs  Lab 01/13/23 1522 01/18/23 2246  WBC 6.6 8.6  HGB 11.9* 9.3*  HCT 34.9* 29.1*  MCV 93.3 101.0*  PLT 156 112*    Basic Metabolic Panel: Recent Labs  Lab 01/14/23 0406 01/15/23 0433 01/16/23 0333 01/17/23 0842 01/18/23 0330  NA 139 133* 136 136 136  K 2.9* 3.1* 3.7 4.3 4.5  CL 115* 113* 114* 111 105  CO2 17* 16* 17* 21* 25  GLUCOSE 104* 104* 95 109* 88  BUN 35* 24* CREATININE 1.28* 1.08 0.99 0.94 0.95  CALCIUM 7.0* 6.8* 7.5* 7.8* 8.4*  MG 1.6* 2.2 1.8 1.7 2.0  PHOS 2.3* 2.1* 1.7* 1.9* 2.6   GFR: Estimated Creatinine Clearance: 74.9 mL/min (by C-G formula based on SCr of 0.95 mg/dL). Recent Labs  Lab 01/13/23 1522 01/18/23 2246  WBC 6.6 8.6    Liver Function Tests: Recent Labs  Lab 01/13/23 1522 01/16/23 0333  AST 23 19  ALT 26 24  ALKPHOS 59 60  BILITOT 1.0 0.9  PROT 5.9* 5.6*  ALBUMIN 3.7 3.5  No results for input(s): "LIPASE", "AMYLASE" in the last 168 hours. No results for input(s): "AMMONIA" in the last 168 hours.  ABG No results found for: "PHART", "PCO2ART", "PO2ART", "HCO3", "TCO2", "ACIDBASEDEF", "O2SAT"   Coagulation Profile: No results for input(s): "INR", "PROTIME" in the last 168 hours.  Cardiac Enzymes: No results for input(s): "CKTOTAL", "CKMB", "CKMBINDEX", "TROPONINI" in the last 168 hours.  HbA1C: No results found for: "HGBA1C"  CBG: Recent Labs  Lab 01/17/23 1701 01/18/23 0004 01/18/23 0622 01/18/23 1639 01/19/23 0025  GLUCAP 112* 104* 84 95 120*    Review of  Systems:   As per HPI  Past Medical History:  He,  has a past medical history of Arthritis, Bowel obstruction, CHF (congestive heart failure) (11/26/2018), Elevated brain natriuretic peptide (BNP) level (11/25/2018), Family history of adverse reaction to anesthesia, Frequent UTI, History of colon polyps (2009), History of shingles, Plantar fasciitis, Prostate hypertrophy, and Vitamin B 12 deficiency.   Surgical History:   Past Surgical History:  Procedure Laterality Date   APPENDECTOMY     CHOLECYSTECTOMY     COLONSCOPY  AGE 40   WITH POLYP REMOVED   SMALL INTESTINE SURGERY     1979   TRANSURETHRAL RESECTION OF PROSTATE N/A 09/22/2017   Procedure: TRANSURETHRAL RESECTION OF THE PROSTATE (TURP);  Surgeon: Malen Gauze, MD;  Location: [facility];  Service: Urology;  Laterality: N/A;     Social History:   reports that he has never smoked. He has never used smokeless tobacco. He reports that he does not drink alcohol and does not use drugs.   Family History:  His family history includes Breast cancer in his mother; Diabetes in an other family member; Heart attack in his father. There is no history of Colon cancer, Esophageal cancer, Stomach cancer, Colon polyps, or Rectal cancer.   Allergies No Known Allergies   Home Medications  Prior to Admission medications   Medication Sig Start Date End Date Taking? Authorizing Provider  acetaminophen (TYLENOL) 500 MG tablet Take 1,000 mg by mouth as needed for moderate pain.   Yes [provider]  aspirin-acetaminophen-caffeine (EXCEDRIN MIGRAINE) [phone] MG tablet Take 1 tablet by mouth as needed for headache or migraine.   Yes [provider]  cyanocobalamin 2000 MCG tablet Take 2,000 mcg by mouth daily.   Yes [provider]  Lactobacillus (FLORAJEN ACIDOPHILUS PO) Take 1 capsule by mouth daily.   Yes [provider]  ondansetron (ZOFRAN-ODT) 8 MG disintegrating tablet Take 8 mg by  mouth every 8 (eight) hours as needed for vomiting or nausea. 01/10/23  Yes [provider]  pantoprazole (PROTONIX) 40 MG tablet Take by mouth. Patient not taking: Reported on 01/14/2023 12/09/22 12/09/23  [provider]     Critical care time: 64

## 2023-01-19 NOTE — Procedures (Signed)
Extubation Procedure Note  Patient Details:   Name: Bradley Dominguez DOB: [birth year] MRN: [id number]   Airway Documentation:    Vent end date: 01/19/23 Vent end time: 0957   Evaluation  O2 sats: stable throughout Complications: No apparent complications Patient did tolerate procedure well. Bilateral Breath Sounds: Clear, Diminished   Yes  Pt was extubated to a 2L . Cuff leak was heard. No stridor was noted. RN was at the bedside with RT during extubation. Pt tolerated well.  Danella Maiers [facility] 01/19/2023, 10:01 AM

## 2023-01-20 ENCOUNTER — Inpatient Hospital Stay (HOSPITAL_COMMUNITY): Payer: BC Managed Care – PPO

## 2023-01-20 ENCOUNTER — Inpatient Hospital Stay (HOSPITAL_COMMUNITY): Payer: BC Managed Care – PPO | Admitting: Anesthesiology

## 2023-01-20 ENCOUNTER — Encounter (HOSPITAL_COMMUNITY): Admission: AD | Disposition: E | Payer: Self-pay | Source: Ambulatory Visit

## 2023-01-20 ENCOUNTER — Other Ambulatory Visit (HOSPITAL_COMMUNITY): Payer: BC Managed Care – PPO

## 2023-01-20 ENCOUNTER — Encounter (HOSPITAL_COMMUNITY): Payer: Self-pay | Admitting: Gastroenterology

## 2023-01-20 DIAGNOSIS — K2901 Acute gastritis with bleeding: Secondary | ICD-10-CM | POA: Diagnosis not present

## 2023-01-20 DIAGNOSIS — K254 Chronic or unspecified gastric ulcer with hemorrhage: Secondary | ICD-10-CM

## 2023-01-20 DIAGNOSIS — K920 Hematemesis: Secondary | ICD-10-CM | POA: Diagnosis not present

## 2023-01-20 DIAGNOSIS — K56609 Unspecified intestinal obstruction, unspecified as to partial versus complete obstruction: Secondary | ICD-10-CM | POA: Diagnosis not present

## 2023-01-20 DIAGNOSIS — R634 Abnormal weight loss: Secondary | ICD-10-CM | POA: Diagnosis not present

## 2023-01-20 DIAGNOSIS — K2971 Gastritis, unspecified, with bleeding: Secondary | ICD-10-CM | POA: Diagnosis not present

## 2023-01-20 HISTORY — PX: IR US GUIDE VASC ACCESS RIGHT: IMG2390

## 2023-01-20 HISTORY — PX: IR ANGIOGRAM VISCERAL SELECTIVE: IMG657

## 2023-01-20 HISTORY — PX: IR FLUORO GUIDE CV LINE RIGHT: IMG2283

## 2023-01-20 HISTORY — PX: IR CHEST FLUORO: IMG2383

## 2023-01-20 HISTORY — PX: IR ANGIOGRAM SELECTIVE EACH ADDITIONAL VESSEL: IMG667

## 2023-01-20 HISTORY — PX: IR EMBO ART  VEN HEMORR LYMPH EXTRAV  INC GUIDE ROADMAPPING: IMG5450

## 2023-01-20 LAB — DIC (DISSEMINATED INTRAVASCULAR COAGULATION)PANEL
D-Dimer, Quant: 0.57 ug/mL-FEU — ABNORMAL HIGH (ref 0.00–0.50)
D-Dimer, Quant: <0.27 ug/mL-FEU (ref 0.00–0.50)
D-Dimer, Quant: <0.27 ug/mL-FEU (ref 0.00–0.50)
D-Dimer, Quant: 1.64 ug/mL-FEU — ABNORMAL HIGH (ref 0.00–0.50)
Fibrinogen: 91 mg/dL — CL (ref 210–475)
Fibrinogen: 136 mg/dL — ABNORMAL LOW (ref 210–475)
Fibrinogen: 201 mg/dL — ABNORMAL LOW (ref 210–475)
Fibrinogen: 303 mg/dL (ref 210–475)
INR: 2.4 — ABNORMAL HIGH (ref 0.8–1.2)
INR: 1.5 — ABNORMAL HIGH (ref 0.8–1.2)
INR: 1.4 — ABNORMAL HIGH (ref 0.8–1.2)
INR: 1.2 (ref 0.8–1.2)
Platelets: 48 10*3/uL — ABNORMAL LOW (ref 150–400)
Platelets: 58 10*3/uL — ABNORMAL LOW (ref 150–400)
Platelets: 77 10*3/uL — ABNORMAL LOW (ref 150–400)
Platelets: 76 10*3/uL — ABNORMAL LOW (ref 150–400)
Prothrombin Time: 25.7 seconds — ABNORMAL HIGH (ref 11.4–15.2)
Prothrombin Time: 17.9 seconds — ABNORMAL HIGH (ref 11.4–15.2)
Prothrombin Time: 16.7 seconds — ABNORMAL HIGH (ref 11.4–15.2)
Prothrombin Time: 14.8 seconds (ref 11.4–15.2)
Smear Review: NONE SEEN
Smear Review: NONE SEEN
Smear Review: NONE SEEN
Smear Review: NONE SEEN
aPTT: 51 seconds — ABNORMAL HIGH (ref 24–36)
aPTT: 40 seconds — ABNORMAL HIGH (ref 24–36)
aPTT: 50 seconds — ABNORMAL HIGH (ref 24–36)
aPTT: 34 seconds (ref 24–36)

## 2023-01-20 LAB — COMPREHENSIVE METABOLIC PANEL
ALT: 17 U/L (ref 0–44)
ALT: 20 U/L (ref 0–44)
AST: 17 U/L (ref 15–41)
AST: 20 U/L (ref 15–41)
Albumin: 1.6 g/dL — ABNORMAL LOW (ref 3.5–5.0)
Albumin: 2.2 g/dL — ABNORMAL LOW (ref 3.5–5.0)
Alkaline Phosphatase: 18 U/L — ABNORMAL LOW (ref 38–126)
Alkaline Phosphatase: 32 U/L — ABNORMAL LOW (ref 38–126)
Anion gap: 6 (ref 5–15)
Anion gap: 5 (ref 5–15)
BUN: 43 mg/dL — ABNORMAL HIGH (ref 8–23)
BUN: 45 mg/dL — ABNORMAL HIGH (ref 8–23)
CO2: 26 mmol/L (ref 22–32)
CO2: 31 mmol/L (ref 22–32)
Calcium: 5.6 mg/dL — CL (ref 8.9–10.3)
Calcium: 6.8 mg/dL — ABNORMAL LOW (ref 8.9–10.3)
Chloride: 91 mmol/L — ABNORMAL LOW (ref 98–111)
Chloride: 98 mmol/L (ref 98–111)
Creatinine, Ser: 0.99 mg/dL (ref 0.61–1.24)
Creatinine, Ser: 0.84 mg/dL (ref 0.61–1.24)
GFR, Estimated: >60 mL/min (ref >60)
GFR, Estimated: >60 mL/min (ref >60)
Glucose, Bld: 713 mg/dL (ref 70–99)
Glucose, Bld: 121 mg/dL — ABNORMAL HIGH (ref 70–99)
Potassium: 4.4 mmol/L (ref 3.5–5.1)
Potassium: 4.1 mmol/L (ref 3.5–5.1)
Sodium: 123 mmol/L — ABNORMAL LOW (ref 135–145)
Sodium: 134 mmol/L — ABNORMAL LOW (ref 135–145)
Total Bilirubin: 0.3 mg/dL (ref 0.3–1.2)
Total Bilirubin: 1 mg/dL (ref 0.3–1.2)
Total Protein: <3 g/dL — ABNORMAL LOW (ref 6.5–8.1)
Total Protein: 3.9 g/dL — ABNORMAL LOW (ref 6.5–8.1)

## 2023-01-20 LAB — CBC
HCT: 12.4 % — ABNORMAL LOW (ref 39.0–52.0)
HCT: 16.2 % — ABNORMAL LOW (ref 39.0–52.0)
HCT: 16.4 % — ABNORMAL LOW (ref 39.0–52.0)
HCT: 18 % — ABNORMAL LOW (ref 39.0–52.0)
HCT: 21.1 % — ABNORMAL LOW (ref 39.0–52.0)
HCT: 27.4 % — ABNORMAL LOW (ref 39.0–52.0)
Hemoglobin: 4 g/dL — CL (ref 13.0–17.0)
Hemoglobin: 5.4 g/dL — CL (ref 13.0–17.0)
Hemoglobin: 5.4 g/dL — CL (ref 13.0–17.0)
Hemoglobin: 6.2 g/dL — CL (ref 13.0–17.0)
Hemoglobin: 7.3 g/dL — ABNORMAL LOW (ref 13.0–17.0)
Hemoglobin: 9.6 g/dL — ABNORMAL LOW (ref 13.0–17.0)
MCH: 29.3 pg (ref 26.0–34.0)
MCH: 29.7 pg (ref 26.0–34.0)
MCH: 29.8 pg (ref 26.0–34.0)
MCH: 30.5 pg (ref 26.0–34.0)
MCH: 30.7 pg (ref 26.0–34.0)
MCH: 31.7 pg (ref 26.0–34.0)
MCHC: 32.3 g/dL (ref 30.0–36.0)
MCHC: 32.9 g/dL (ref 30.0–36.0)
MCHC: 33.3 g/dL (ref 30.0–36.0)
MCHC: 34.4 g/dL (ref 30.0–36.0)
MCHC: 34.6 g/dL (ref 30.0–36.0)
MCHC: 35 g/dL (ref 30.0–36.0)
MCV: 86.5 fL (ref 80.0–100.0)
MCV: 87 fL (ref 80.0–100.0)
MCV: 88.7 fL (ref 80.0–100.0)
MCV: 89 fL (ref 80.0–100.0)
MCV: 89.1 fL (ref 80.0–100.0)
MCV: 98.4 fL (ref 80.0–100.0)
Platelets: 51 10*3/uL — ABNORMAL LOW (ref 150–400)
Platelets: 58 10*3/uL — ABNORMAL LOW (ref 150–400)
Platelets: 58 10*3/uL — ABNORMAL LOW (ref 150–400)
Platelets: 65 10*3/uL — ABNORMAL LOW (ref 150–400)
Platelets: 75 10*3/uL — ABNORMAL LOW (ref 150–400)
Platelets: 79 10*3/uL — ABNORMAL LOW (ref 150–400)
RBC: 1.26 MIL/uL — ABNORMAL LOW (ref 4.22–5.81)
RBC: 1.82 MIL/uL — ABNORMAL LOW (ref 4.22–5.81)
RBC: 1.84 MIL/uL — ABNORMAL LOW (ref 4.22–5.81)
RBC: 2.08 MIL/uL — ABNORMAL LOW (ref 4.22–5.81)
RBC: 2.38 MIL/uL — ABNORMAL LOW (ref 4.22–5.81)
RBC: 3.15 MIL/uL — ABNORMAL LOW (ref 4.22–5.81)
RDW: 14.3 % (ref 11.5–15.5)
RDW: 14.4 % (ref 11.5–15.5)
RDW: 14.6 % (ref 11.5–15.5)
RDW: 14.9 % (ref 11.5–15.5)
RDW: 15 % (ref 11.5–15.5)
RDW: 15.9 % — ABNORMAL HIGH (ref 11.5–15.5)
WBC: 7.2 10*3/uL (ref 4.0–10.5)
WBC: 7.5 10*3/uL (ref 4.0–10.5)
WBC: 7.7 10*3/uL (ref 4.0–10.5)
WBC: 7.8 10*3/uL (ref 4.0–10.5)
WBC: 8.3 10*3/uL (ref 4.0–10.5)
WBC: 8.8 10*3/uL (ref 4.0–10.5)
nRBC: 0 % (ref 0.0–0.2)
nRBC: 0 % (ref 0.0–0.2)
nRBC: 0 % (ref 0.0–0.2)
nRBC: 0 % (ref 0.0–0.2)
nRBC: 0 % (ref 0.0–0.2)
nRBC: 0 % (ref 0.0–0.2)

## 2023-01-20 LAB — BPAM PLATELET PHERESIS
Blood Product Expiration Date: 202404162359
ISSUE DATE / TIME: 202404150241
ISSUE DATE / TIME: 202404150756
ISSUE DATE / TIME: 202404150841
ISSUE DATE / TIME: 202404151156
Unit Type and Rh: 5100
Unit Type and Rh: 6200
Unit Type and Rh: 6200

## 2023-01-20 LAB — BPAM RBC
Blood Product Expiration Date: 202405072359
Blood Product Expiration Date: 202405132359
Blood Product Expiration Date: 202405162359
Blood Product Expiration Date: 202405172359
Blood Product Expiration Date: 202405172359
Blood Product Expiration Date: 202405172359
ISSUE DATE / TIME: 202404141256
ISSUE DATE / TIME: 202404150043
ISSUE DATE / TIME: 202404150200
ISSUE DATE / TIME: 202404150230
ISSUE DATE / TIME: 202404151151
Unit Type and Rh: 5100
Unit Type and Rh: 9500
Unit Type and Rh: 9500
Unit Type and Rh: 9500

## 2023-01-20 LAB — BLOOD GAS, ARTERIAL
Acid-Base Excess: 13.3 mmol/L — ABNORMAL HIGH (ref 0.0–2.0)
Acid-Base Excess: 11.9 mmol/L — ABNORMAL HIGH (ref 0.0–2.0)
Acid-Base Excess: 10.3 mmol/L — ABNORMAL HIGH (ref 0.0–2.0)
Bicarbonate: 37.5 mmol/L — ABNORMAL HIGH (ref 20.0–28.0)
Bicarbonate: 36.5 mmol/L — ABNORMAL HIGH (ref 20.0–28.0)
Bicarbonate: 34.3 mmol/L — ABNORMAL HIGH (ref 20.0–28.0)
Drawn by: 270211
Drawn by: 270211
Drawn by: 27021
FIO2: 100 %
FIO2: 40 %
MECHVT: 630 mL
MECHVT: 580 mL
O2 Saturation: 99.9 %
O2 Saturation: 100 %
O2 Saturation: 100 %
PEEP: 5 cmH2O
Patient temperature: 34.8
Patient temperature: 35.2
Patient temperature: 37.3
RATE: 18 resp/min
RATE: 18 resp/min
pCO2 arterial: 43 mmHg (ref 32–48)
pCO2 arterial: 45 mmHg (ref 32–48)
pCO2 arterial: 44 mmHg (ref 32–48)
pH, Arterial: 7.54 — ABNORMAL HIGH (ref 7.35–7.45)
pH, Arterial: 7.51 — ABNORMAL HIGH (ref 7.35–7.45)
pH, Arterial: 7.51 — ABNORMAL HIGH (ref 7.35–7.45)
pO2, Arterial: 301 mmHg — ABNORMAL HIGH (ref 83–108)
pO2, Arterial: 370 mmHg — ABNORMAL HIGH (ref 83–108)
pO2, Arterial: 207 mmHg — ABNORMAL HIGH (ref 83–108)

## 2023-01-20 LAB — BASIC METABOLIC PANEL
Anion gap: 7 (ref 5–15)
BUN: 43 mg/dL — ABNORMAL HIGH (ref 8–23)
CO2: 32 mmol/L (ref 22–32)
Calcium: 6.7 mg/dL — ABNORMAL LOW (ref 8.9–10.3)
Chloride: 94 mmol/L — ABNORMAL LOW (ref 98–111)
Creatinine, Ser: 0.83 mg/dL (ref 0.61–1.24)
GFR, Estimated: >60 mL/min (ref >60)
Glucose, Bld: 203 mg/dL — ABNORMAL HIGH (ref 70–99)
Potassium: 4.2 mmol/L (ref 3.5–5.1)
Sodium: 133 mmol/L — ABNORMAL LOW (ref 135–145)

## 2023-01-20 LAB — PREPARE PLATELET PHERESIS: Unit division: 0

## 2023-01-20 LAB — PREPARE FRESH FROZEN PLASMA
Unit division: 0
Unit division: 0

## 2023-01-20 LAB — BPAM CRYOPRECIPITATE
Blood Product Expiration Date: 202404150811
Blood Product Expiration Date: 202404151807
Unit Type and Rh: 5100

## 2023-01-20 LAB — GLUCOSE, CAPILLARY
Glucose-Capillary: 122 mg/dL — ABNORMAL HIGH (ref 70–99)
Glucose-Capillary: 210 mg/dL — ABNORMAL HIGH (ref 70–99)
Glucose-Capillary: 75 mg/dL (ref 70–99)
Glucose-Capillary: 78 mg/dL (ref 70–99)

## 2023-01-20 LAB — TYPE AND SCREEN
Unit division: 0
Unit division: 0
Unit division: 0
Unit division: 0

## 2023-01-20 LAB — BPAM FFP
Blood Product Expiration Date: 202404202359
Blood Product Expiration Date: 202404202359
ISSUE DATE / TIME: 202404150202
ISSUE DATE / TIME: 202404150238
ISSUE DATE / TIME: 202404150246
ISSUE DATE / TIME: 202404150842
ISSUE DATE / TIME: 202404150842
Unit Type and Rh: 5100
Unit Type and Rh: 6200

## 2023-01-20 LAB — PREPARE RBC (CROSSMATCH)

## 2023-01-20 LAB — HEMOGLOBIN AND HEMATOCRIT, BLOOD
HCT: 15.1 % — ABNORMAL LOW (ref 39.0–52.0)
HCT: 26 % — ABNORMAL LOW (ref 39.0–52.0)
Hemoglobin: 4.6 g/dL — CL (ref 13.0–17.0)
Hemoglobin: 9.3 g/dL — ABNORMAL LOW (ref 13.0–17.0)

## 2023-01-20 LAB — PREPARE CRYOPRECIPITATE

## 2023-01-20 LAB — MAGNESIUM
Magnesium: 1 mg/dL — ABNORMAL LOW (ref 1.7–2.4)
Magnesium: 1.6 mg/dL — ABNORMAL LOW (ref 1.7–2.4)
Magnesium: 2.5 mg/dL — ABNORMAL HIGH (ref 1.7–2.4)

## 2023-01-20 LAB — CORTISOL: Cortisol, Plasma: 20.8 ug/dL

## 2023-01-20 LAB — TRIGLYCERIDES: Triglycerides: 24 mg/dL (ref <150)

## 2023-01-20 LAB — PHOSPHORUS
Phosphorus: 3.3 mg/dL (ref 2.5–4.6)
Phosphorus: 3.3 mg/dL (ref 2.5–4.6)

## 2023-01-20 LAB — MASSIVE TRANSFUSION PROTOCOL ORDER (BLOOD BANK NOTIFICATION)

## 2023-01-20 SURGERY — LAPAROTOMY, EXPLORATORY
Anesthesia: General

## 2023-01-20 MED ORDER — ETOMIDATE 2 MG/ML IV SOLN
INTRAVENOUS | Status: AC
  Administered 2023-01-20: 18 mg via INTRAVENOUS
  Filled 2023-01-20: qty 20

## 2023-01-20 MED ORDER — SUCCINYLCHOLINE CHLORIDE 200 MG/10ML IV SOSY
PREFILLED_SYRINGE | INTRAVENOUS | Status: AC
  Administered 2023-01-20: 160 mg
  Filled 2023-01-20: qty 10

## 2023-01-20 MED ORDER — LIDOCAINE HCL 1 % IJ SOLN
INTRAMUSCULAR | Status: AC
  Filled 2023-01-20: qty 20

## 2023-01-20 MED ORDER — DEXTROSE 10 % IV SOLN: INTRAVENOUS | Status: DC

## 2023-01-20 MED ORDER — CALCIUM GLUCONATE-NACL 1-0.675 GM/50ML-% IV SOLN
1.0000 g | Freq: Once | INTRAVENOUS | Status: AC
  Administered 2023-01-20: 1000 mg via INTRAVENOUS
  Filled 2023-01-20: qty 50

## 2023-01-20 MED ORDER — PHENYLEPHRINE 80 MCG/ML (10ML) SYRINGE FOR IV PUSH (FOR BLOOD PRESSURE SUPPORT)
PREFILLED_SYRINGE | INTRAVENOUS | Status: AC
  Administered 2023-01-20: 200 ug via INTRAVENOUS
  Filled 2023-01-20: qty 20

## 2023-01-20 MED ORDER — ATROPINE SULFATE 1 MG/10ML IJ SOSY
PREFILLED_SYRINGE | INTRAMUSCULAR | Status: AC
  Filled 2023-01-20: qty 10

## 2023-01-20 MED ORDER — CALCIUM GLUCONATE-NACL 2-0.675 GM/100ML-% IV SOLN: 2.0000 g | Freq: Once | INTRAVENOUS | Status: DC

## 2023-01-20 MED ORDER — IODIXANOL 320 MG/ML IV SOLN: 250.0000 mL | Freq: Once | INTRAVENOUS | Status: DC | PRN

## 2023-01-20 MED ORDER — MAGNESIUM SULFATE 2 GM/50ML IV SOLN
2.0000 g | Freq: Once | INTRAVENOUS | Status: DC
  Filled 2023-01-20: qty 50

## 2023-01-20 MED ORDER — EPINEPHRINE HCL 5 MG/250ML IV SOLN IN NS
INTRAVENOUS | Status: AC
  Administered 2023-01-20: 0.5 ug/min via INTRAVENOUS
  Filled 2023-01-20: qty 250

## 2023-01-20 MED ORDER — CALCIUM CHLORIDE 10 % IV SOLN
INTRAVENOUS | Status: AC
  Filled 2023-01-20: qty 10

## 2023-01-20 MED ORDER — SODIUM BICARBONATE 8.4 % IV SOLN
INTRAVENOUS | Status: AC
  Filled 2023-01-20: qty 50

## 2023-01-20 MED ORDER — MAGNESIUM SULFATE 2 GM/50ML IV SOLN: 2.0000 g | Freq: Once | INTRAVENOUS | Status: DC

## 2023-01-20 MED ORDER — ORAL CARE MOUTH RINSE: 15.0000 mL | OROMUCOSAL | Status: DC | PRN

## 2023-01-20 MED ORDER — CALCIUM CHLORIDE 10 % IV SOLN
INTRAVENOUS | Status: AC
  Administered 2023-01-20: 1 g via INTRAVENOUS
  Filled 2023-01-20: qty 10

## 2023-01-20 MED ORDER — SODIUM BICARBONATE 8.4 % IV SOLN: 100.0000 meq | Freq: Once | INTRAVENOUS | Status: AC

## 2023-01-20 MED ORDER — PHENYLEPHRINE HCL-NACL 20-0.9 MG/250ML-% IV SOLN: 0.0000 ug/min | INTRAVENOUS | Status: DC

## 2023-01-20 MED ORDER — SODIUM BICARBONATE 8.4 % IV SOLN
INTRAVENOUS | Status: AC
  Administered 2023-01-20: 100 meq via INTRAVENOUS
  Filled 2023-01-20: qty 50

## 2023-01-20 MED ORDER — ROCURONIUM BROMIDE 10 MG/ML (PF) SYRINGE
PREFILLED_SYRINGE | INTRAVENOUS | Status: AC
  Filled 2023-01-20: qty 10

## 2023-01-20 MED ORDER — TRANEXAMIC ACID-NACL 1000-0.7 MG/100ML-% IV SOLN
1000.0000 mg | Freq: Once | INTRAVENOUS | Status: AC
  Administered 2023-01-20: 1000 mg via INTRAVENOUS
  Filled 2023-01-20: qty 100

## 2023-01-20 MED ORDER — HYDROMORPHONE HCL-NACL 50-0.9 MG/50ML-% IV SOLN
0.5000 mg/h | INTRAVENOUS | Status: DC
  Administered 2023-01-20 (×2): 1 mg/h via INTRAVENOUS
  Filled 2023-01-20 (×2): qty 50

## 2023-01-20 MED ORDER — BUPIVACAINE-EPINEPHRINE (PF) 0.25% -1:200000 IJ SOLN
INTRAMUSCULAR | Status: AC
  Filled 2023-01-20: qty 30

## 2023-01-20 MED ORDER — SODIUM CHLORIDE 0.9% IV SOLUTION: Freq: Once | INTRAVENOUS | Status: DC

## 2023-01-20 MED ORDER — LIDOCAINE HCL 1 % IJ SOLN
20.0000 mL | Freq: Once | INTRAMUSCULAR | Status: AC
  Administered 2023-01-20: 7 mL via INTRADERMAL

## 2023-01-20 MED ORDER — MAGNESIUM SULFATE 4 GM/100ML IV SOLN
4.0000 g | Freq: Once | INTRAVENOUS | Status: AC
  Administered 2023-01-20: 4 g via INTRAVENOUS
  Filled 2023-01-20: qty 100

## 2023-01-20 MED ORDER — SODIUM CHLORIDE 0.9% IV SOLUTION: Freq: Once | INTRAVENOUS | Status: AC

## 2023-01-20 MED ORDER — HEPARIN SODIUM (PORCINE) 1000 UNIT/ML IJ SOLN
INTRAMUSCULAR | Status: AC
  Filled 2023-01-20: qty 10

## 2023-01-20 MED ORDER — LACTATED RINGERS IV SOLN: INTRAVENOUS | Status: DC

## 2023-01-20 MED ORDER — INSULIN ASPART 100 UNIT/ML IJ SOLN: 0.0000 [IU] | INTRAMUSCULAR | Status: DC

## 2023-01-20 MED ORDER — GELATIN ABSORBABLE 12-7 MM EX MISC
CUTANEOUS | Status: AC
  Filled 2023-01-20: qty 1

## 2023-01-20 MED ORDER — FENTANYL CITRATE PF 50 MCG/ML IJ SOSY
PREFILLED_SYRINGE | INTRAMUSCULAR | Status: AC
  Filled 2023-01-20: qty 2

## 2023-01-20 MED ORDER — CALCIUM CHLORIDE 10 % IV SOLN: 1.0000 g | Freq: Once | INTRAVENOUS | Status: AC

## 2023-01-20 MED ORDER — SODIUM BICARBONATE 8.4 % IV SOLN
INTRAVENOUS | Status: DC
  Filled 2023-01-20: qty 150

## 2023-01-20 MED ORDER — EPINEPHRINE HCL 5 MG/250ML IV SOLN IN NS
0.5000 ug/min | INTRAVENOUS | Status: DC
  Filled 2023-01-20: qty 250

## 2023-01-20 MED ORDER — MAGNESIUM SULFATE 2 GM/50ML IV SOLN
INTRAVENOUS | Status: AC
  Filled 2023-01-20: qty 50

## 2023-01-20 MED ORDER — ORAL CARE MOUTH RINSE
15.0000 mL | OROMUCOSAL | Status: DC
  Administered 2023-01-20 – 2023-01-22 (×21): 15 mL via OROMUCOSAL

## 2023-01-20 MED ORDER — TRAVASOL 10 % IV SOLN
INTRAVENOUS | Status: AC
  Filled 2023-01-20: qty 1122

## 2023-01-20 MED ORDER — PROTHROMBIN COMPLEX CONC HUMAN 500 UNITS IV KIT
1623.0000 [IU] | PACK | Status: AC
  Administered 2023-01-20: 1623 [IU] via INTRAVENOUS
  Filled 2023-01-20: qty 623

## 2023-01-20 MED FILL — Medication: Qty: 1 | Status: AC

## 2023-01-20 NOTE — Progress Notes (Signed)
PHARMACY - TOTAL PARENTERAL NUTRITION CONSULT NOTE   Indication: Intolerance to enteral feeding  Patient Measurements: Height: 6\' 1"  (185.4 cm) Weight: 67.2 kg (148 lb 2.4 oz) IBW/kg (Calculated) : 79.9 TPN AdjBW (KG): 68.1 Body mass index is 19.55 kg/m.   Assessment: Pt is a 44 yoM admitted on 4/8 with N/V, chronic partial SBO, history of multiple previous abdominal surgeries. Pt has very minimal PO intake with ongoing weight loss. Pharmacy consulted to manage TPN.   Glucose / Insulin: No history of DM. Goal CBG: <180 -CBG range: 95-172, 1 unit SSI required.  Electrolytes: TPN off at midnight, Labs from ~1:45 AM: Na down to 123, Cl low, Glucose elevated 713 (contaminated w/ TPN?), Ca low at 5.6 - CorrCa 7.52, Mag low at 1 - CaCl 1g, Bicarb 100 mEq given.  Mag 2g ordered, not given in [facility] charting.  - Recheck labs this morning (FYI, drawn during MTP and during blood transfusion): Na low at 133, Cl low, CorrCa 8.6, Mag 1.6. Renal: SCr WNL/stable, BUN 43 Hepatic: LFTs, Tbili, Alk Phos, all WNL. Albumin 1.6 Intake / Output; MIVF:  -UOP: 1000 mL/24, hematemesis 4/14-4/15 not charted. LBM 4/12 -mIVF: Massive transfusion protocol started 4/15 AM, LR @ 75.  Also getting Lephophed and vasopressin infusions, Protonix infusion.  GI Imaging:  -4/4 UGI: Findings suggestive of chronic partial small bowel obstruction or ileus -4/9 CT Abd: Ileus vs high grade partial SBO GI Surgeries / Procedures:  -4/11 EGD: Gastric and small bowel dilation. Could have functional afferent loop syndrome or internal hernia 4/14 emergent bedside EGD:  oozing cratered gastric ulcer w/ oozing hemorrhage> inj w/ epi & clips placed - 4/15 IR:  IR for emergency embolization -4/15: Anticipated ex lap will be rescheduled d/t shock  Central access: Double lumen PICC placed 4/8 TPN start date: 4/9  Nutritional Goals:  Goal TPN rate is 85 mL/hr (provides 112 g of protein and 2101 kcals per day)  RD  Assessment: Estimated Needs Total Energy Estimated Needs: 2050-2250 Total Protein Estimated Needs: 100-115g Total Fluid Estimated Needs: 2.1L/day  Current Nutrition:  NPO  TPN / D10  Plan:  Now:  starting D10 while TPN is held At 1800: Resume TPN @ goal rate of 85 mL/hr  Electrolytes in TPN: Na 100 mEq/L (further escalation limited by TPN volume), K 40 mEq/L, Ca 10 mEq/L, Mg 61mEq/L, and Phos 18 mmol/L EX:HBZJIRC 1:1 Add standard MVI and trace elements to TPN Completed 5 days thiamine 4/13 Continue very Sensitive q8h SSI and adjust as needed  mIVF per MD Monitor TPN labs on Mon/Thurs and PRN   Lynann Beaver PharmD, BCPS [facility] main pharmacy [phone] 01/20/2023 7:34 AM

## 2023-01-20 NOTE — Progress Notes (Signed)
Patient has received 2u PRBCs, 2u FFP, 2 cryo and platelets this morning. Pressor requirement is decreasing. Making urine. He opens eyes to verbal stimuli and follows commands. Will repeat labs with DIC panel, and transfuse as needed per MTP based on hemodynamics.

## 2023-01-20 NOTE — Sedation Documentation (Signed)
Procedure is complete at this time. There was no need for additional sedatives, pt remained comfortable and calm.

## 2023-01-20 NOTE — Progress Notes (Signed)
4/14--2200: pt w/hematochezia; increasing pressure support (vaso @ .03, levo @ 40) needed as his MAPs remained in low 60s. Contacted E-Link for consult. I started the albumin early while I waited for a response.  Orders in for 2L LR bolus  Pt became extremely diaphoretic, said he needed to get on the bedpan again, and during the attempt his eyes rolled to upward and he quickly became pale. He then vomited frank blood emesis with clots.  NG tube inserted and set to suction. Several ice water lavages were requires to ease the bleeding and remove clots  Hgb result of 4 from 10--MTP initiated. There was no time to scan all the meds/blood products as several nurses worked together to attempt to stabilize the pt.   In total, the pt was administered 6U PRBCs, 4U FFP, 1-2U platelets (pt platelet value was 65), 1U cryo, 1 dose KCENTRA, several pushes of sodium bicarb, along with calcium push and mag sulfate.  Pt was intubated and moved to IR (as stability allowed) for angiogram w/o incident. Dr. Loreta Ave was able to confirm adequate placement of ETT and insert a triple lumen femoral line as well.  Pt actively bleeding from nose & mouth. He did not tolerate the dilaudid drip very well (a-fib rhythm when titrated >1). Pt wife is adamant that he not receive fentanyl. Versed is suitably tolerated by Pt.   Please see notes from Drs. Forrestine Him, and [facility] for clarification.

## 2023-01-20 NOTE — Progress Notes (Signed)
NAME:  Bradley Dominguez, MRN:  [id number], DOB:  02/02/1956, LOS: 7 ADMISSION DATE:  01/13/2023, CONSULTATION DATE: 01/19/2023 REFERRING MD: Dr. Freida Busman, CHIEF COMPLAINT: GI bleed  History of Present Illness:  67 yo M HF, chronic partial small bowel obstruction and multiple previous abd surgeries  who was admitted to [facility] 4/8 from home w n/v/bloating. GI consult 4/10, underwent EGD 4/11 -- concerning for duodenal obstruction. Plan for 4/15 ex lap LOA duodenojejunal bypass.    On 4/14 started vomiting blood, dark red and clots noted. BP on lower end but baseline BP with systolics in 80's to low 90's so not far. Repeat hgb in 8's from 9. Denies pain or nausea, just fatigued. GI reconsulted and considering repeat endoscopy   PCCM consulted   Pertinent  Medical History   has a past medical history of Arthritis, Bowel obstruction, CHF (congestive heart failure) (11/26/2018), Elevated brain natriuretic peptide (BNP) level (11/25/2018), Family history of adverse reaction to anesthesia, Frequent UTI, History of colon polyps (2009), History of shingles, Plantar fasciitis, Prostate hypertrophy, and Vitamin B 12 deficiency.  Significant Hospital Events: Including procedures, antibiotic start and stop dates in addition to other pertinent events   4/8 admit 4/11 EGD -- duodenal obstruction 4/14 vomiting blood, hypotensive pccm consult  4/15 Significant hematemesis/hematochezia. Decompensated. Intubated. To IR for embolization of jejunal arcade branch. MTP for multiple products.   Interim History / Subjective:  Significant hematemesis/hematochezia. Decompensated. Intubated. To IR for embolization of jejunal arcade branch. MTP ordered. . TXA also given.   Objective   Blood pressure (!) 92/51, pulse 69, temperature (!) 97.4 F (36.3 C), temperature source Oral, resp. rate 15, height 6\' 1"  (1.854 m), weight 67.2 kg, SpO2 100 %.    Vent Mode: PRVC FiO2 (%):  [60 %-100 %] 100 % Set Rate:  [18 bmp] 18 bmp Vt  Set:  [630 mL] 630 mL PEEP:  [5 cmH20] 5 cmH20 Plateau Pressure:  [15 cmH20-20 cmH20] 20 cmH20   Intake/Output Summary (Last 24 hours) at 01/20/2023 0939 Last data filed at 01/20/2023 9977 Gross per 24 hour  Intake 7312.02 ml  Output 1001 ml  Net 6311.02 ml    Filed Weights   01/17/23 0500 01/18/23 0500 01/19/23 4142  Weight: 68.3 kg 69.2 kg 67.2 kg    Examination:  General: Thin middle aged male on vent HENT: [place]/AT, PERRL, ETT Lungs: Clear bilateral breath sounds Cardiovascular: Bradycardic, regular, no MRG Abdomen: Soft, NT, ND, remote surgical scars.  Extremities: No acute deformity or significant edema.  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 DIC - Unfortunately having ongoing hematemesis and hemodynamic instability - Surgery and GI following - IR following - Transfusing per MTP - Vasopressors as needed to support hemodynamics while we resuscitate.  - Labs pending  Acute respiratory failure with hypoxia - Full vent support - Do not wean today - Dilaudid infusion for RASS goal -1 to -2  Duodenal obstruction: Nonfunctioning prior GJ bypass - Plan was for surgery 4/15, but now with shock, will need to revisit.  - NPO - TPN  Protein calorie malnutrition -TPN on hold pending stability   Best Practice (right click and "Reselect all SmartList Selections" daily)   Diet/type: TPN DVT prophylaxis: SCD GI prophylaxis: PPI Lines: Central line, PICC Foley:  N/A Code Status:  full code Last date of multidisciplinary  goals of care discussion [Per primary]  Labs   CBC: Recent Labs  Lab 01/19/23 0305 01/19/23 7829 01/19/23 1654 01/19/23 2341 01/20/23 0150 01/20/23 0616  WBC 10.0 10.7* 11.6* 7.2  --  7.5  7.7  HGB 8.6* 8.8* 10.0* 4.0* 4.6* 5.4*  5.4*  HCT 26.8* 26.9* 30.9* 12.4* 15.1* 16.4*   16.2*  MCV 101.9* 106.3* 96.9 98.4  --  89.1  89.0  PLT 128* 105*  100* 118* 65* 48* 58*  51*  58*     Basic Metabolic Panel: Recent Labs  Lab 01/16/23 0333 01/17/23 0842 01/18/23 0330 01/19/23 0305 01/19/23 0959 01/20/23 0146  NA 136 136 136 132*  --  123*  K 3.7 4.3 4.5 4.4  --  4.4  CL 114* 111 105 108  --  91*  CO2 17* 21* 25 20*  --  26  GLUCOSE 95 109* 88 191*  --  713*  BUN 61*  --  43*  CREATININE 0.99 0.94 0.95 1.05  --  0.99  CALCIUM 7.5* 7.8* 8.4* 7.4*  --  5.6*  MG 1.8 1.7 2.0 1.5*  --  1.0*  PHOS 1.7* 1.9* 2.6  --  2.5 3.3    GFR: Estimated Creatinine Clearance: 69.8 mL/min (by C-G formula based on SCr of 0.99 mg/dL). Recent Labs  Lab 01/19/23 0633 01/19/23 1654 01/19/23 2341 01/20/23 0616  WBC 10.7* 11.6* 7.2 7.5  7.7     Liver Function Tests: Recent Labs  Lab 01/13/23 1522 01/16/23 0333 01/20/23 0146  AST ALT ALKPHOS 59 60 18*  BILITOT 1.0 0.9 0.3  PROT 5.9* 5.6* <3.0*  ALBUMIN 3.7 3.5 1.6*    No results for input(s): "LIPASE", "AMYLASE" in the last 168 hours. No results for input(s): "AMMONIA" in the last 168 hours.  ABG    Component Value Date/Time   PHART 7.54 (H) 01/20/2023 0825   PCO2ART 43 01/20/2023 0825   PO2ART 301 (H) 01/20/2023 0825   HCO3 37.5 (H) 01/20/2023 0825   ACIDBASEDEF 2.3 (H) 01/19/2023 0820   O2SAT 99.9 01/20/2023 0825     Coagulation Profile: Recent Labs  Lab 01/19/23 0305 01/19/23 0633 01/20/23 0150 01/20/23 0616  INR 1.3* 1.4* 2.4* 1.5*     Cardiac Enzymes: No results for input(s): "CKTOTAL", "CKMB", "CKMBINDEX", "TROPONINI" in the last 168 hours.  HbA1C: No results found for: "HGBA1C"  CBG: Recent Labs  Lab 01/19/23 0025 01/19/23 0810 01/19/23 1622 01/19/23 2348 01/20/23 0823  GLUCAP 120* 140* 101* 172* 210*     Review of Systems:   Sedated, unable to provide history  Past Medical History:  He,  has a past medical history of Arthritis, Bowel  obstruction, CHF (congestive heart failure) (11/26/2018), Elevated brain natriuretic peptide (BNP) level (11/25/2018), Family history of adverse reaction to anesthesia, Frequent UTI, History of colon polyps (2009), History of shingles, Plantar fasciitis, Prostate hypertrophy, and Vitamin B 12 deficiency.   Surgical History:   Past Surgical History:  Procedure Laterality Date   APPENDECTOMY     BIOPSY  01/16/2023   Procedure: BIOPSY;  Surgeon: Lynann Bologna, MD;  Location: [facility];  Service: Gastroenterology;;   CHOLECYSTECTOMY     COLONSCOPY  AGE 41   WITH POLYP REMOVED   ESOPHAGOGASTRODUODENOSCOPY (EGD) WITH PROPOFOL N/A 01/16/2023   Procedure: ESOPHAGOGASTRODUODENOSCOPY (EGD) WITH PROPOFOL;  Surgeon: Lynann Bologna, MD;  Location: [facility];  Service: Gastroenterology;  Laterality: N/A;   SMALL INTESTINE SURGERY  1979   TRANSURETHRAL RESECTION OF PROSTATE N/A 09/22/2017   Procedure: TRANSURETHRAL RESECTION OF THE PROSTATE (TURP);  Surgeon: Malen Gauze, MD;  Location: [facility];  Service: Urology;  Laterality: N/A;     Social History:   reports that he has never smoked. He has never used smokeless tobacco. He reports that he does not drink alcohol and does not use drugs.   Family History:  His family history includes Breast cancer in his mother; Diabetes in an other family member; Heart attack in his father. There is no history of Colon cancer, Esophageal cancer, Stomach cancer, Colon polyps, or Rectal cancer.   Allergies No Known Allergies   Critical care time 43 minutes  Joneen Roach, AGACNP-BC [facility]  See Amion for personal pager PCCM on call pager [phone] until 7pm. Please call Elink 7p-7a. [phone]  01/20/2023 10:24 AM

## 2023-01-20 NOTE — Progress Notes (Signed)
No ABG per MD Gaynell Face.

## 2023-01-20 NOTE — Procedures (Signed)
Intubation Procedure Note  Bradley Dominguez  [id number]  [birth year]  Date:01/20/23  Time:3:53 AM   Provider Performing:Julene Rahn Gaynell Face    Procedure: Intubation (31500)  Indication(s) Respiratory Failure  Consent Risks of the procedure as well as the alternatives and risks of each were explained to the patient and/or caregiver.  Consent for the procedure was obtained and is signed in the bedside chart   Anesthesia Etomidate, Succinylcholine, and see MAR   Time Out Verified patient identification, verified procedure, site/side was marked, verified correct patient position, special equipment/implants available, medications/allergies/relevant history reviewed, required imaging and test results available.   Sterile Technique Usual hand hygeine, masks, and gloves were used   Procedure Description Patient positioned in bed supine.  Sedation given as noted above.  Patient was intubated with endotracheal tube using Glidescope.  View was Grade 1 full glottis .  Number of attempts was 1.  Colorimetric CO2 detector was consistent with tracheal placement.   Complications/Tolerance None; patient tolerated the procedure well. Chest X-ray is ordered to verify placement.   EBL Active GIB ongoing   Specimen(s) None

## 2023-01-20 NOTE — Progress Notes (Signed)
Patient ID: Bradley Dominguez, male   DOB: October 23, 1955, 67 y.o.   MRN: [id number]  Called by CCM NP re: patient with large UGI bleed. He underwent EGD earlier on 01/19/23 to address bleeding at previous biopsy site. The patient was extubated later in the day by CCM.  Later in the day, he required increasing pressor support.   Tonight, he had large amount of melena and hematemesis with hemodynamic instability.  CBC showed Hgb of 4.0.  He was reintubated by CCM and massive transfusion protocol was activated.  Interventional Radiology has been called and is enroute for arteriogram/ embolization.  Wife and son are at the bedside.  Appreciate Dr. Gaynell Face CCM for her management of this situation.  Wilmon Arms. Corliss Skains, MD, [facility]  General Surgery   01/20/2023 2:22 AM

## 2023-01-20 NOTE — Progress Notes (Addendum)
1 Day Post-Op   Subjective/Chief Complaint: Events of last night noted. Able to have bleeding vessel embolized. Still in shock. Needs more aggressive resuscitation   Objective: Vital signs in last 24 hours: Temp:  [97.4 F (36.3 C)-97.7 F (36.5 C)] 97.4 F (36.3 C) (04/14 2339) Pulse Rate:  [59-86] 69 (04/15 0400) Resp:  [9-20] 20 (04/15 0400) BP: (81-110)/(32-60) 92/51 (04/15 0400) SpO2:  [99 %-100 %] 100 % (04/15 0817) FiO2 (%):  [40 %-100 %] 100 % (04/15 0817) Last BM Date : 01/19/23  Intake/Output from previous day: 04/14 0701 - 04/15 0700 In: 2005 [I.V.:1007; Blood:298; IV Piggyback:700] Out: 1001 [Urine:1000; Stool:1] Intake/Output this shift: No intake/output data recorded.  General appearance: sedated on vent Resp: clear to auscultation bilaterally and on vent Cardio: tachy and hypotensive on multiple pressors GI: soft  Lab Results:  Recent Labs    01/19/23 2341 01/20/23 0150 01/20/23 0616  WBC 7.2  --  7.5  7.7  HGB 4.0* 4.6* 5.4*  5.4*  HCT 12.4* 15.1* 16.4*  16.2*  PLT 65* 48* 58*  51*  58*   BMET Recent Labs    01/19/23 0305 01/20/23 0146  NA 132* 123*  K 4.4 4.4  CL 108 91*  CO2 20* 26  GLUCOSE 191* 713*  BUN 61* 43*  CREATININE 1.05 0.99  CALCIUM 7.4* 5.6*   PT/INR Recent Labs    01/20/23 0150 01/20/23 0616  LABPROT 25.7* 17.9*  INR 2.4* 1.5*   ABG Recent Labs    01/19/23 0820  PHART 7.4  HCO3 22.4    Studies/Results: DG CHEST PORT 1 VIEW  Result Date: 01/19/2023 CLINICAL DATA:  Intubation EXAM: PORTABLE CHEST 1 VIEW COMPARISON:  None Available. FINDINGS: Endotracheal tube with tip just below the clavicular heads. An enteric tube tip and side-port are at the stomach. Right-sided central line with tip at the upper cavoatrial junction. Gas dilated bowel in the upper abdomen, there has been recent abdominal CT. There is no edema, consolidation, effusion, or pneumothorax. IMPRESSION: Unremarkable hardware. Clear lungs.  Electronically Signed   By: Tiburcio Pea M.D.   On: 01/19/2023 07:07   DG Abd 1 View  Result Date: 01/19/2023 CLINICAL DATA:  NG tube placement EXAM: ABDOMEN - 1 VIEW COMPARISON:  CT abdomen and pelvis 494 FINDINGS: Gas-filled loops of dilated bowel in the left upper abdomen are redemonstrated. Enteric tube tip and side-port in the stomach. IMPRESSION: Enteric tube tip and side-port in the stomach. Dilated loops of bowel in the upper abdomen. Electronically Signed   By: Minerva Fester M.D.   On: 01/19/2023 03:25    Anti-infectives: Anti-infectives (From admission, onward)    Start     Dose/Rate Route Frequency Ordered Stop   01/19/23 0600  cefTRIAXone (ROCEPHIN) 2 g in sodium chloride 0.9 % 100 mL IVPB       See Hyperspace for full Linked Orders Report.   2 g 200 mL/hr over 30 Minutes Intravenous On call to O.R. 01/19/23 0350 01/19/23 0606   01/19/23 0350  metroNIDAZOLE (FLAGYL) IVPB 500 mg  Status:  Discontinued       See Hyperspace for full Linked Orders Report.   500 mg 100 mL/hr over 60 Minutes Intravenous Every 8 hours 01/19/23 0350 01/19/23 0752       Assessment/Plan: s/p Procedure(s): ESOPHAGOGASTRODUODENOSCOPY (EGD) (N/A) HEMOSTASIS CONTROL HEMOSTASIS CLIP PLACEMENT Transfuse prbc 's, cryo, and platelets then recheck coags and cbc Continue vent support per CCM Hold on surgery for now. Will discuss with Dr.  Allen Continue bowel rest May consider tpn soon once out of shock UGI bleed. Continue high dose PPI  LOS: 7 days    Chevis Pretty III 01/20/2023

## 2023-01-20 NOTE — Progress Notes (Signed)
Pt transferred to IR on the vent and back to ICU without any complications.

## 2023-01-20 NOTE — Progress Notes (Signed)
Blood products given under Mass Transfusion Protocol Total given from 5320-2334:  PRBC - 7 units  Plasma - 4 units  Platelets - 3 units  Cryo - 2 units  All paperwork from blood products given during this shift completed and placed in patient chart.  Ulis Rias, RN 01/20/2023 6:20 PM

## 2023-01-20 NOTE — Progress Notes (Signed)
Cross covering ICU physician.,    Called while at [facility] for emergent airway in pt with active and profuse GIB. Arrived pt on 2 vasopressors with MTP ongoing. Pt was emergently intubated.   Ordered txa Cryo Bicarb Calcium  Magnesium In addition to previously administered items from [facility].   Pt was intubated without issue continued to have frank bleeding. We continued transfusions and resuscitation while IR team arrived.   Personally helped in transport of pt to IR suite without issue. Remains on 2 pressors, bicarb infusion, protonix infusion  Vent was increased too 100% with poor oxygen carrying capacity with hgb of 4->4.6 and can titrate once stabilized more.   Would leave pt intubated until several stable hgb post procedural.  Maintain active type and screen.   Family updated at length

## 2023-01-20 NOTE — Progress Notes (Addendum)
Patient ID: Bradley Dominguez, male   DOB: [birth year], 67 y.o.   MRN: [id number]    Progress Note   Subjective   Day # 7 CC; admitted with weight loss, nausea/vomiting, abnormal imaging with duodenal dilation and concerns for partial duodenal obstruction  IV PPI infusion Epi/norepinephrine/vasopressin Intubated  EGD 01/16/2023 some residual food in the gastric body, stomach very dilated with mild gastritis postoperative gastric anatomy very unusual type seems like gastrojejunostomy small bowel loops are all dilated without any masses, pylorus is intact but with acute angulation-this was entered on retroflexed exam leading to a dilated first and second portion of the duodenum with a blind pouch and a large amount of retained solid food ?  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 Since 7 AM this morning he has had 2 units of packed RBCs, 2 units of FFP and cryo.  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 INR 1.5/pro time 17.9/fibrinogen 136/D-dimer 0.27/platelets58  Systolic blood pressure low 90s/heart rate 50s  NG tube with about 500 cc of dark red blood     Objective   Vital signs in last 24 hours: Temp:  [97.4 F (36.3 C)-97.7 F (36.5 C)] 97.4 F (36.3 C) (04/14 2339) Pulse Rate:   [59-129] 69 (04/15 0400) Resp:  [9-26] 15 (04/15 0600) BP: (81-110)/(32-60) 92/51 (04/15 0400) SpO2:  [95 %-100 %] 100 % (04/15 0817) Arterial Line BP: (54-130)/(32-55) 94/49 (04/15 0600) FiO2 (%):  [40 %-100 %] 100 % (04/15 0817) Last BM Date : 01/19/23 General:    Older white male acutely ill, intubated, sedated Heart:  Regular rate and rhythm; no murmurs Lungs: Respirations even worse breath sounds bilaterally Abdomen:  Soft,nondistended.  Few bowel sounds present midline incisional scars Extremities:  Without edema. Neurologic: Debated and sedated  Intake/Output from previous day: 04/14 0701 - 04/15 0700 In: 2005 [I.V.:1007; Blood:298; IV Piggyback:700] Out: 1001 [Urine:1000; Stool:1] Intake/Output this shift: Total I/O In: 5318.2 [I.V.:5118.2; IV Piggyback:200] Out: -   Lab Results: Recent Labs    01/19/23 1654 01/19/23 2341 01/20/23 0150 01/20/23 0616  WBC 11.6* 7.2  --  7.5  7.7  HGB 10.0* 4.0* 4.6* 5.4*  5.4*  HCT 30.9* 12.4* 15.1* 16.4*  16.2*  PLT 118* 65* 48* 58*  51*  58*   BMET Recent Labs    01/18/23 0330 01/19/23 0305 01/20/23 0146  NA 136 132* 123*  K 4.5 4.4 4.4  CL 105 108 91*  CO2 25 20* 26  GLUCOSE 88 191* 713*  BUN 19 61* 43*  CREATININE 0.95 1.05 0.99  CALCIUM 8.4* 7.4* 5.6*   LFT Recent Labs    01/20/23 0146  PROT <3.0*  ALBUMIN 1.6*  AST 17  ALT 17  ALKPHOS 18*  BILITOT 0.3   PT/INR Recent Labs    01/20/23 0150 01/20/23 0616  LABPROT 25.7* 17.9*  INR 2.4* 1.5*    Studies/Results: DG  CHEST PORT 1 VIEW  Result Date: 01/19/2023 CLINICAL DATA:  Intubation EXAM: PORTABLE CHEST 1 VIEW COMPARISON:  None Available. FINDINGS: Endotracheal tube with tip just below the clavicular heads. An enteric tube tip and side-port are at the stomach. Right-sided central line with tip at the upper cavoatrial junction. Gas dilated bowel in the upper abdomen, there has been recent abdominal CT. There is no edema, consolidation, effusion, or  pneumothorax. IMPRESSION: Unremarkable hardware. Clear lungs. Electronically Signed   By: Tiburcio Pea M.D.   On: 01/19/2023 07:07   DG Abd 1 View  Result Date: 01/19/2023 CLINICAL DATA:  NG tube placement EXAM: ABDOMEN - 1 VIEW COMPARISON:  CT abdomen and pelvis 494 FINDINGS: Gas-filled loops of dilated bowel in the left upper abdomen are redemonstrated. Enteric tube tip and side-port in the stomach. IMPRESSION: Enteric tube tip and side-port in the stomach. Dilated loops of bowel in the upper abdomen. Electronically Signed   By: Minerva Fester M.D.   On: 01/19/2023 03:25       Assessment / Plan:    #10 67 year old male status post multiple previous remote abdominal surgeries including a gastric surgery in the 1970s type not known, and distal small bowel resection with removal of about 8 feet of small bowel. Patient has now had 3 to 26-month history of postprandial abdominal distention pain nausea and vomiting and subsequent 25 pound weight loss. Admitted for further workup and also started on TPN  Diffusely dilated small bowel on imaging without transition point Upper GI discerning for partial duodenal obstruction with the majority of the contrast emptying the stomach promptly but remaining in the duodenum for almost the entire 3 hours of the study.  EGD was done 01/17/2023 for further evaluation prior to plan for exploratory lap Found a very dilated stomach with a postoperative stomach very unusual type possible gastrojejunostomy, pylorus intact but acutely angulated entered with retroflex and led to a dilated first and second portion of the duodenum with a blind pouch large amount of retained solid food Question internal hernia question functional a ferret loop syndrome  Acute major upper GI bleed yesterday-finding of a 4 mm ulcer felt to be at the prior biopsy site-this was oozing, treated with epinephrine and then hemoclipping with hemostasis  Patient rebled massively last night-sent to  IR and underwent embolization of the jejunal arcade branch with coil and Gelfoam, 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 Resume TPN when able   Principal Problem:   Bowel obstruction Active Problems:   Protein-calorie malnutrition, severe   Gastrointestinal hemorrhage associated with acute gastritis     LOS: 7 days   Amy Esterwood PA-C 01/20/2023, 8:43 AM  GI ATTENDING  Interval history data reviewed.  Patient seen and examined.  Agree with interval progress note as outlined above.  Dramatic overnight issues related to bleeding pseudoaneurysm noted.  Treated with IR coil embolization therapy.  Multipronged resuscitative and supportive measures as outlined.  Continue with the same.  At this point, nothing further to add from GI medicine perspective.  No role for endoscopic therapy for pseudoaneurysmal disease.  We are available as needed for questions or new issues.  Wilhemina Bonito. Eda Keys., M.D. [facility] Division of Gastroenterology

## 2023-01-20 NOTE — Progress Notes (Signed)
   01/19/23 0300  What Happened  Was fall witnessed? No  Was patient injured? No  Patient found on floor  Found by Staff-comment (Nurse)  Stated prior activity bathroom-unassisted  Provider Notification  Provider Name/Title Dr. Andrey Campanile  Date Provider Notified 01/19/23  Time Provider Notified 0300  Method of Notification Call  Notification Reason Fall  Provider response See new orders  Date of Provider Response 01/19/23  Time of Provider Response 0300  Follow Up  Family notified Yes - comment  Time family notified 0300 [facility] Bradley Dominguez spoke with wife)  Simple treatment Other (comment) (assess the pt.)  Progress note created (see row info) Yes  Adult Fall Risk Assessment  Risk Factor Category (scoring not indicated) Fall has occurred during this admission (document High fall risk)  Age 67  Fall History: Fall within 6 months prior to admission 0  Elimination; Bowel and/or Urine Incontinence 2  Elimination; Bowel and/or Urine Urgency/Frequency 0  Medications: includes PCA/Opiates, Anti-convulsants, Anti-hypertensives, Diuretics, Hypnotics, Laxatives, Sedatives, and Psychotropics 0  Patient Care Equipment 2  Mobility-Assistance 2  Mobility-Gait 2  Mobility-Sensory Deficit 0  Altered awareness of immediate physical environment 0  Impulsiveness 0  Lack of understanding of one's physical/cognitive limitations 0  Total Score 9  Patient Fall Risk Level High fall risk  Adult Fall Risk Interventions  Required Bundle Interventions *See Row Information* High fall risk - low, moderate, and high requirements implemented  Additional Interventions PT/OT need assessed if change in mobility from baseline  Screening for Fall Injury Risk (To be completed on HIGH fall risk patients) - Assessing Need for Floor Mats  Risk For Fall Injury- Criteria for Floor Mats None identified - No additional interventions needed (transferred pt to ICU)  Vitals  BP 102/62  MAP (mmHg) 73  BP Location Left Arm  BP  Method Automatic  Patient Position (if appropriate) Lying  Pulse Rate (!) 109  Pulse Rate Source Monitor  ECG Heart Rate (!) 101  Cardiac Rhythm ST  Resp 17  Oxygen Therapy  SpO2 100 %  O2 Device Room Air  Patient Activity (if Appropriate) In bed  Pulse Oximetry Type Continuous  Pain Assessment  Pain Scale 0-10  Pain Score 0  PCA/Epidural/Spinal Assessment  Respiratory Pattern Regular;Unlabored  Neurological  Level of Consciousness Alert  Orientation Level Oriented X4  Glasgow Coma Scale  Eye Opening 4  Best Verbal Response (NON-intubated) 5  Best Motor Response 6  Glasgow Coma Scale Score 15  Musculoskeletal  Musculoskeletal (WDL) X  Assistive Device None  Integumentary  Integumentary (WDL) X  RN Assisting with Skin Assessment on Admission Bradley Dominguez  Skin Color Pale  Skin Condition Dry  Skin Integrity Intact  Abrasion Location Head (older injury with scab present)  Abrasion Location Orientation Upper  Skin Turgor Non-tenting

## 2023-01-20 NOTE — Procedures (Signed)
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  Right CFV CVC is ok to use, in the iliac vein   Complications: None  Recommendations:  - unchanged hemodynamics, return to ICU - continue serial H&H - continue resuscitation - OK to use the right femoral CVC - the endotracheal tube is adequate position - right femoral CFA access closed with CELT device. Right hip straight while CVC is in place - VIR to follow  Signed,  Yvone Neu. Loreta Ave, DO

## 2023-01-20 NOTE — Progress Notes (Addendum)
       Overnight   NAME: Bradley Dominguez MRN: [id number] DOB : 31-Jul-1956    Date of Service   01/20/2023   HPI/Events of Note    Notified by E-Link Physician for assistance in ICU code with a specific order entry.  E-link Physician Dr Cloyd Stagers requested (assistance) specifically with order entry of the following: Massive transfusion Protocol to include :   3 - units of Packed Red Blood Cells  2- units of FFP  1 - unit of Platelets  No further assistance requested. Dr Council Mechanic is en-route.   Interventions/ Plan   Dr Cloyd Stagers request for specific orders entered and read back in assistance as requested.       Chinita Greenland BSN MSNA MSN ACNPC-AG Acute Care Nurse Practitioner [facility]

## 2023-01-20 NOTE — Consult Note (Signed)
Chief Complaint: Upper GI hemorrhage  Referring Physician(s): Allen,Shelby L Dr. Wynona Neat, Critical Care  Supervising Physician: Gilmer Mor  Patient Status: Bradley Dominguez - In-pt  History of Present Illness: Bradley Dominguez is a 67 y.o. male currently admitted to [facility] (01/13/23) for SBO, now with acute upper GI bleeding.   VIR was called about possible angio and embolization.   I met Bradley Dominguez and his wife Bradley Dominguez in ICU room 1236.  He was undergoing intubation at the time, and his wife provides the brief history, along with the medical record.   He was admitted as direct admit by General Surgery with worsening nausea, vomiting, bloating and weakness 01/13/23. Presentation and CT imaging 4/9 are consistent with SBO.    He has history of abdominal surgery years ago, his wife tells me 45 years, as [facility].  The details are currently unknown, however, imaging is suggestive of a variation of gastro-jejunostomy.     He underwent upper GI 01/16/23    Path results 4/11 are negative.     Hematemesis started then 4/14 just after midnight, with initiation of resuscitation (Dr. Tawana Scale note), and escalation of care.   Another upper endoscopy was performed 79   TRANSURETHRAL RESECTION OF PROSTATE N/A 09/22/2017   Procedure: TRANSURETHRAL RESECTION OF THE PROSTATE (TURP);  Surgeon: Malen Gauze, MD;  Location: [facility];  Service: Urology;  Laterality: N/A;    Allergies: Patient has no known allergies.  Medications: Prior to Admission medications   Medication Sig Start Date End Date Taking? Authorizing Provider  acetaminophen (TYLENOL) 500 MG tablet Take 1,000 mg by mouth as needed for moderate pain.   Yes [provider]  aspirin-acetaminophen-caffeine (EXCEDRIN MIGRAINE) [phone] MG tablet Take 1 tablet by  mouth as needed for headache or migraine.   Yes [provider]  cyanocobalamin 2000 MCG tablet Take 2,000 mcg by mouth daily.   Yes [provider]  Lactobacillus (FLORAJEN ACIDOPHILUS PO) Take 1 capsule by mouth daily.   Yes [provider]  ondansetron (ZOFRAN-ODT) 8 MG disintegrating tablet Take 8 mg by mouth every 8 (eight) hours as needed for vomiting or nausea. 01/10/23  Yes [provider]   pantoprazole (PROTONIX) 40 MG tablet Take by mouth. Patient not taking: Reported on 01/14/2023 12/09/22 12/09/23  [provider]     Family History  Problem Relation Age of Onset   Breast cancer Mother    Heart attack Father    Diabetes Other    Colon cancer Neg Hx    Esophageal cancer Neg Hx    Stomach cancer Neg Hx    Colon polyps Neg Hx    Rectal cancer Neg Hx     Social History   Socioeconomic History   Marital status: Married    Spouse name: Not on file   Number of children: 1   Years of education: Not on file   Highest education level: Not on file  Occupational History   Occupation: Teacher, music  Tobacco Use   Smoking status: Never   Smokeless tobacco: Never  Vaping Use   Vaping Use: Never used  Substance and Sexual Activity   Alcohol use: No    Alcohol/week: 0.0 standard drinks of alcohol   Drug use: No   Sexual activity: Not on file  Other Topics Concern   Not on file  Social History Narrative   Not on file   Social Determinants of Health   Financial Resource Strain: Not on file  Food Insecurity: No Food Insecurity (01/13/2023)   Hunger Vital Sign    Worried About Running Out of Food in the Last Year: Never true    Ran Out of Food in the Last Year: Never true  Transportation Needs: No Transportation Needs (01/13/2023)   PRAPARE - Administrator, Civil Service (Medical): No    Lack of Transportation (Non-Medical): No  Physical Activity: Not on file  Stress: Not on file  Social Connections: Not on file       Review of Systems: A 12 point ROS discussed and pertinent positives are indicated in the HPI above.  All other systems are negative.  Review of Systems  Vital Signs: BP (!) 89/43   Pulse 84   Temp (!) 97.4 F (36.3 C) (Oral)   Resp 18   Ht  (1.854 m)   Wt 67.2 kg   SpO2 100%   BMI 19.55 kg/m   Advance Care Plan: The advanced care plan/surrogate decision maker was discussed at the time of visit and documented  in the medical record.    Physical Exam General: 67 yo male, intubated ICU, critically ill. Thin. HEENT: Atraumatic, normocephalic.  NG in place.  ET in place.  .  Neck: Symmetric with no goiter enlargement.  Chest/Lungs:  ET/mechanical vent.  Symmetric chest with inspiration/expiration.  No labored breathing.  Bilateral breath sounds + Heart:  No third heart sounds appreciated. No JVD appreciated.  Abdomen:   + melena. Thin abdomen.  Midline scar.   .   Genito-urinary: deferred Neurologic: Intubated. .   Pulse Exam:  palpable right and left CFA.        Imaging: DG CHEST PORT 1 VIEW  Result Date: 01/19/2023 CLINICAL DATA:  Intubation  EXAM: PORTABLE CHEST 1 VIEW COMPARISON:  None Available. FINDINGS: Endotracheal tube with tip just below the clavicular heads. An enteric tube tip and side-port are at the stomach. Right-sided central line with tip at the upper cavoatrial junction. Gas dilated bowel in the upper abdomen, there has been recent abdominal CT. There is no edema, consolidation, effusion, or pneumothorax. IMPRESSION: Unremarkable hardware. Clear lungs. Electronically Signed   By: Tiburcio Pea M.D.   On: 01/19/2023 07:07   DG Abd 1 View  Result Date: 01/19/2023 CLINICAL DATA:  NG tube placement EXAM: ABDOMEN - 1 VIEW COMPARISON:  CT abdomen and pelvis 494 FINDINGS: Gas-filled loops of dilated bowel in the left upper abdomen are redemonstrated. Enteric tube tip and side-port in the stomach. IMPRESSION: Enteric tube tip and side-port in the stomach. Dilated loops of bowel in the upper abdomen. Electronically Signed   By: Minerva Fester M.D.   On: 01/19/2023 03:25   CT ABDOMEN PELVIS W CONTRAST  Result Date: 01/15/2023 CLINICAL DATA:  Abdominal distension, suspect partial obstruction. EXAM: CT ABDOMEN AND PELVIS WITH CONTRAST TECHNIQUE: Multidetector CT imaging of the abdomen and pelvis was performed using the standard protocol following bolus administration of intravenous contrast.  RADIATION DOSE REDUCTION: This exam was performed according to the departmental dose-optimization program which includes automated exposure control, adjustment of the mA and/or kV according to patient size and/or use of iterative reconstruction technique. CONTRAST:  OMNIPAQUE IOHEXOL 300 MG/ML  SOLN COMPARISON:  09/16/2022. FINDINGS: Lower chest: Stable subpleural nodules are noted bilaterally unchanged from 2018 and are likely benign. The lung bases are otherwise clear. Hepatobiliary: 6 mm hypodensity is present in the left lobe of the liver, most likely cyst or hemangioma. Fatty infiltration of the liver is noted. No biliary ductal dilatation. The gallbladder is not visualized on exam. Pancreas: Unremarkable. No pancreatic ductal dilatation or surrounding inflammatory changes. Spleen: Normal in size without focal abnormality. Adrenals/Urinary Tract: The adrenal glands are within normal limits. The kidneys enhance symmetrically. No renal calculus or hydronephrosis. The bladder is unremarkable. Stomach/Bowel: Surgical clips are present at the gastroesophageal junction. Stomach is within normal limits. There are multiple distended loops of fluid-filled small bowel in the abdomen measuring up to 6.0 cm. No definite transition point is seen. Paucity of intra-abdominal fat limits evaluation of individual bowel loops. The visualized colon is mildly distended with stool. An anastomotic site is seen in the small bowel in the right lower quadrant. No definite free air or pneumatosis. The appendix is not visualized on exam. Vascular/Lymphatic: No significant vascular findings are present. No enlarged abdominal or pelvic lymph nodes. Reproductive: Prostate is unremarkable. Other: No abdominopelvic ascites. Musculoskeletal: No acute osseous abnormality. IMPRESSION: 1. Multiple distended loops of fluid-filled small bowel measuring up to 6.0 cm with no definite transition point, not significantly changed from the prior  exam. Examination of the small bowel is limited due to lack of oral contrast and paucity of intra-abdominal fat findings may represent ileus versus high-grade partial small bowel obstruction. Surgical consultation is recommended. 2. Hepatic steatosis. 3. Aortic atherosclerosis. Electronically Signed   By: Thornell Sartorius M.D.   On: 01/15/2023 01:33   [place] EKG SITE RITE  Result Date: 01/13/2023 If Site Rite image not attached, placement could not be confirmed due to current cardiac rhythm.  DG UGI W SMALL BOWEL  Result Date: 01/09/2023 CLINICAL DATA:  Chronic weight loss, abdominal distention, and bloating. History of gastrointestinal surgery. EXAM: UPPER GI SERIES WITH SMALL BOWEL FOLLOW-THROUGH FLUOROSCOPY: Radiation Exposure Index (  as provided by the fluoroscopic device): 21.0 mGy Kerma TECHNIQUE: Single contrast upper GI series using Gastrografin. Subsequently, serial images of the small bowel were obtained. COMPARISON:  Abdominal x-ray dated December 27, 2022. CT abdomen pelvis dated September 16, 2022. FINDINGS: Scout Radiograph: Multiple dilated loops of bowel are not significantly changed from abdominal x-ray two weeks ago. Small bowel anastomosis again noted in the right lower quadrant. Esophagus: Normal appearance. Esophageal motility: Within normal limits. Gastroesophageal reflux: None visualized. Ingested 37mm barium tablet: Not given. Stomach: Normal appearance. Postsurgical changes at the GE junction. Tiny sliding hiatal hernia. Gastric emptying: Normal. Small bowel: The second portion of the duodenum is distended, and largely retains most of the enteric contrast over the course of the 3 hour examination. Some contrast reaches the nondilated mid jejunum, but is largely diluted at this point. Other:  None. IMPRESSION: 1. 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 2. Tiny sliding hiatal hernia. Electronically Signed   By: Obie Dredge M.D.   On: 01/09/2023 13:35   DG Abd 2  Views  Result Date: 12/27/2022 CLINICAL DATA:  Abdominal bloating, vomiting. EXAM: ABDOMEN - 2 VIEW COMPARISON:  CT scan of September 16, 2022. FINDINGS: Severely dilated bowel loops are noted with air-fluid levels. This appears to represent both large and small bowel. Midline surgical sutures are noted. IMPRESSION: Severely dilated large and small bowel loops are noted with air-fluid levels, concerning for distal small bowel obstruction or diffuse ileus. Electronically Signed   By: Lupita Raider M.D.   On: 12/27/2022 15:08    Labs:  CBC: Recent Labs    01/19/23 0305 01/19/23 0633 01/19/23 1654 01/19/23 2341  WBC 10.0 10.7* 11.6* 7.2  HGB 8.6* 8.8* 10.0* 4.0*  HCT 26.8* 26.9* 30.9* 12.4*  PLT 128* 105*  100* 118* 65*    COAGS: Recent Labs    01/19/23 0305 01/19/23 0633  INR 1.3* 1.4*  APTT  --  31    BMP: Recent Labs    01/16/23 0333 01/17/23 0842 01/18/23 0330 01/19/23 0305  NA 136 136 136 132*  K 3.7 4.3 4.5 4.4  CL 114* 111 105 108  CO2 17* 21* 25 20*  GLUCOSE 95 109* 88 191*  BUN 20 19 19  61*  CALCIUM 7.5* 7.8* 8.4* 7.4*  CREATININE 0.99 0.94 0.95 1.05  GFRNONAA >60 >60 >60 >60    LIVER FUNCTION TESTS: Recent Labs    01/13/23 1522 01/16/23 0333  BILITOT 1.0 0.9  AST 23 19  ALT 26 24  ALKPHOS 59 60  PROT 5.9* 5.6*  ALBUMIN 3.7 3.5    TUMOR MARKERS: No results for input(s): "AFPTM", "CEA", "CA199", "CHROMGRNA" in the last 8760 hours.  Assessment and Plan:  Bradley Moscone is 67 yo male admitted with SBO, now with acute upper GI bleeding leading to acute blood loss anemia and hypovolemic shock.    I discussed his case with Critical Care.  Both surgery and GI are following, however, he is high risk for surgery.   I discussed angiogram and embolization with his wife, Bradley Dominguez.  I do think that this is the best first step for him at this time, considering his ongoing shock with current management, blood loss and ongoing resuscitation.  Risks and benefits of  mesenteric angiogram were discussed with the patient's wife including, but not limited to bleeding, infection, vascular injury, organ injury/contrast induced renal failure, need for further procedure/surgery, non-target embolization, bowel ischemia, cardiopulmonary collapse. .  This interventional procedure involves  the use of X-rays and because of the nature of the planned procedure, it is possible that we will have prolonged use of X-ray fluoroscopy.  Potential radiation risks to you include (but are not limited to) the following: - A slightly elevated risk for cancer  several years later in life. This risk is typically less than 0.5% percent. This risk is low in comparison to the normal incidence of human cancer, which is 33% for women and 50% for men according to the American [employer] Society. - Radiation induced injury can include skin redness, resembling a rash, tissue breakdown / ulcers and hair loss (which can be temporary or permanent).   The likelihood of either of these occurring depends on the difficulty of the procedure and whether you are sensitive to radiation due to previous procedures, disease, or genetic conditions.   IF your procedure requires a prolonged use of radiation, you will be notified and given written instructions for further action.  It is your responsibility to monitor the irradiated area for the 2 weeks following the procedure and to notify your physician if you are concerned that you have suffered a radiation induced injury.    All of the patient's questions were answered, patient is agreeable to proceed.  Consent signed and in chart.  Plan for emergent angiogram and embolization. Plan for image guided CVC placement for resuscitation.   His wife confirms that he is a full code.    Thank you for this interesting consult.  I greatly enjoyed meeting Bradley Dominguez and look forward to participating in their care.  A copy of this report was sent to the requesting  provider on this date.  Electronically Signed: Gilmer Mor, DO 01/20/2023, 1:53 AM   I spent a total of 80 Minutes    in face to face in clinical consultation, greater than 50% of which was counseling/coordinating care for emergent upper GI hemorrhage, possible angiogram/embolization

## 2023-01-21 DIAGNOSIS — K2901 Acute gastritis with bleeding: Secondary | ICD-10-CM | POA: Diagnosis not present

## 2023-01-21 LAB — PREPARE CRYOPRECIPITATE
Unit division: 0
Unit division: 0
Unit division: 0

## 2023-01-21 LAB — PREPARE FRESH FROZEN PLASMA

## 2023-01-21 LAB — BPAM RBC
Blood Product Expiration Date: 202405152359
Blood Product Expiration Date: 202405222359
Blood Product Expiration Date: 202405222359
Blood Product Expiration Date: 202405232359
ISSUE DATE / TIME: 202404150043
ISSUE DATE / TIME: 202404150230
ISSUE DATE / TIME: 202404150748
ISSUE DATE / TIME: 202404150844
ISSUE DATE / TIME: 202404150844
Unit Type and Rh: 9500
Unit Type and Rh: 9500
Unit Type and Rh: 9500

## 2023-01-21 LAB — TYPE AND SCREEN
Unit division: 0
Unit division: 0
Unit division: 0
Unit division: 0
Unit division: 0
Unit division: 0
Unit division: 0

## 2023-01-21 LAB — BPAM PLATELET PHERESIS
Blood Product Expiration Date: 202404162359
Blood Product Expiration Date: 202404162359
Blood Product Expiration Date: 202404172359
Blood Product Expiration Date: 202404172359
Blood Product Expiration Date: 202404182359
ISSUE DATE / TIME: 202404150241
ISSUE DATE / TIME: 202404150241
Unit Type and Rh: 5100
Unit Type and Rh: 5100
Unit Type and Rh: 7300

## 2023-01-21 LAB — CBC
HCT: 28 % — ABNORMAL LOW (ref 39.0–52.0)
HCT: 27.9 % — ABNORMAL LOW (ref 39.0–52.0)
Hemoglobin: 9.7 g/dL — ABNORMAL LOW (ref 13.0–17.0)
Hemoglobin: 9.6 g/dL — ABNORMAL LOW (ref 13.0–17.0)
MCH: 30.7 pg (ref 26.0–34.0)
MCH: 30.7 pg (ref 26.0–34.0)
MCHC: 34.6 g/dL (ref 30.0–36.0)
MCHC: 34.4 g/dL (ref 30.0–36.0)
MCV: 88.6 fL (ref 80.0–100.0)
MCV: 89.1 fL (ref 80.0–100.0)
Platelets: 63 10*3/uL — ABNORMAL LOW (ref 150–400)
Platelets: 64 10*3/uL — ABNORMAL LOW (ref 150–400)
RBC: 3.16 MIL/uL — ABNORMAL LOW (ref 4.22–5.81)
RBC: 3.13 MIL/uL — ABNORMAL LOW (ref 4.22–5.81)
RDW: 14.3 % (ref 11.5–15.5)
RDW: 14.6 % (ref 11.5–15.5)
WBC: 8.2 10*3/uL (ref 4.0–10.5)
WBC: 6.5 10*3/uL (ref 4.0–10.5)
nRBC: 0 % (ref 0.0–0.2)
nRBC: 0 % (ref 0.0–0.2)

## 2023-01-21 LAB — BASIC METABOLIC PANEL
Anion gap: 5 (ref 5–15)
BUN: 42 mg/dL — ABNORMAL HIGH (ref 8–23)
CO2: 31 mmol/L (ref 22–32)
Calcium: 7 mg/dL — ABNORMAL LOW (ref 8.9–10.3)
Chloride: 94 mmol/L — ABNORMAL LOW (ref 98–111)
Creatinine, Ser: 0.8 mg/dL (ref 0.61–1.24)
GFR, Estimated: >60 mL/min (ref >60)
Glucose, Bld: 129 mg/dL — ABNORMAL HIGH (ref 70–99)
Potassium: 4.6 mmol/L (ref 3.5–5.1)
Sodium: 130 mmol/L — ABNORMAL LOW (ref 135–145)

## 2023-01-21 LAB — PREPARE PLATELET PHERESIS
Unit division: 0
Unit division: 0
Unit division: 0
Unit division: 0
Unit division: 0

## 2023-01-21 LAB — GLUCOSE, CAPILLARY
Glucose-Capillary: 100 mg/dL — ABNORMAL HIGH (ref 70–99)
Glucose-Capillary: 75 mg/dL (ref 70–99)
Glucose-Capillary: 81 mg/dL (ref 70–99)
Glucose-Capillary: 82 mg/dL (ref 70–99)
Glucose-Capillary: 91 mg/dL (ref 70–99)
Glucose-Capillary: 92 mg/dL (ref 70–99)

## 2023-01-21 LAB — BPAM FFP
ISSUE DATE / TIME: 202404150255
Unit Type and Rh: 5100

## 2023-01-21 LAB — BPAM CRYOPRECIPITATE
Blood Product Expiration Date: 202404151415
ISSUE DATE / TIME: 202404150214
ISSUE DATE / TIME: 202404150827
ISSUE DATE / TIME: 202404151221
Unit Type and Rh: 5100
Unit Type and Rh: 5100

## 2023-01-21 LAB — HEMOGLOBIN A1C
Hgb A1c MFr Bld: 5.1 % (ref 4.8–5.6)
Mean Plasma Glucose: 99.67 mg/dL

## 2023-01-21 LAB — BLOOD GAS, ARTERIAL
Acid-Base Excess: 9.4 mmol/L — ABNORMAL HIGH (ref 0.0–2.0)
Bicarbonate: 34.3 mmol/L — ABNORMAL HIGH (ref 20.0–28.0)
FIO2: 40 %
O2 Saturation: 100 %
Patient temperature: 37.3
RATE: 53 resp/min
pCO2 arterial: 47 mmHg (ref 32–48)
pH, Arterial: 7.48 — ABNORMAL HIGH (ref 7.35–7.45)
pO2, Arterial: 156 mmHg — ABNORMAL HIGH (ref 83–108)

## 2023-01-21 LAB — PREPARE RBC (CROSSMATCH)

## 2023-01-21 LAB — LACTATE DEHYDROGENASE: LDH: 70 U/L — ABNORMAL LOW (ref 98–192)

## 2023-01-21 LAB — RETICULOCYTES
Immature Retic Fract: 13.4 % (ref 2.3–15.9)
RBC.: 1.76 MIL/uL — ABNORMAL LOW (ref 4.22–5.81)
Retic Count, Absolute: 21.3 10*3/uL (ref 19.0–186.0)
Retic Ct Pct: 1.2 % (ref 0.4–3.1)

## 2023-01-21 LAB — MAGNESIUM: Magnesium: 1.8 mg/dL (ref 1.7–2.4)

## 2023-01-21 MED ORDER — TRAVASOL 10 % IV SOLN
INTRAVENOUS | Status: AC
  Filled 2023-01-21: qty 1122

## 2023-01-21 MED ORDER — HYDROMORPHONE HCL 1 MG/ML IJ SOLN
0.5000 mg | INTRAMUSCULAR | Status: DC | PRN
  Administered 2023-01-21 – 2023-01-24 (×3): 1 mg via INTRAVENOUS
  Administered 2023-01-24 (×2): 0.5 mg via INTRAVENOUS
  Administered 2023-01-25 (×3): 1 mg via INTRAVENOUS
  Administered 2023-01-26 (×2): 0.5 mg via INTRAVENOUS
  Administered 2023-01-26 – 2023-01-27 (×3): 1 mg via INTRAVENOUS
  Filled 2023-01-21 (×12): qty 1

## 2023-01-21 MED ORDER — INSULIN ASPART 100 UNIT/ML IJ SOLN
0.0000 [IU] | Freq: Three times a day (TID) | INTRAMUSCULAR | Status: DC
  Administered 2023-01-23 – 2023-01-24 (×2): 1 [IU] via SUBCUTANEOUS

## 2023-01-21 MED ORDER — SODIUM CHLORIDE 0.9% IV SOLUTION: Freq: Once | INTRAVENOUS | Status: AC

## 2023-01-21 NOTE — Progress Notes (Signed)
Nutrition Follow-up  DOCUMENTATION CODES:   Severe malnutrition in context of chronic illness  INTERVENTION:  - Continue TPN at goal of 39mL/hr which provides 112g protein, 306g dextrose, 612 IL kcals = 2102 kcal/day  -TPN management per Pharmacy  -Daily weights while on TPN   NUTRITION DIAGNOSIS:   Severe Malnutrition related to chronic illness as evidenced by moderate fat depletion, severe muscle depletion, energy intake < or equal to 75% for > or equal to 1 month, percent weight loss. *ongoing  GOAL:   Patient will meet greater than or equal to 90% of their needs *met with TPN  MONITOR:   Diet advancement, Labs, Weight trends, I & O's (TPN)  REASON FOR ASSESSMENT:   Consult New TPN/TNA  ASSESSMENT:   67 yo male who was admitted from home with worsening nausea, vomiting, bloating and weakness. Patient with remote history of multiple prior laparotomies (in the 1970's), with a chronic partial small bowel obstruction.  4/8: admitted, PICC line placed 4/9: TPN initiated 4/13: increased to goal TPN of 39mL/hr 4/15: Intubated  Patient intubated but awake at time of visit. Wife at bedside.   TPN infusing at goal rate of 56mL/hr. Wife reports she is thankful patient is receiving nutrition to help optimize him for his upcoming surgery.  Per surgery's note today, patient will need surgery for duodenal obstruction in the future, but planning to wait until patient more stable.   For now, plan to continue TPN.   Medications reviewed and include: Insulin Levophed Vasopressin  Labs reviewed:  Na 130 Triglycerides 24 (4/15)   Diet Order:   Diet Order     None       EDUCATION NEEDS:  Education needs have been addressed  Skin:  Skin Assessment: Reviewed RN Assessment  Last BM:  4/15  Height:  Ht Readings from Last 1 Encounters:  01/19/23  (1.854 m)   Weight:  Wt Readings from Last 1 Encounters:  01/21/23 78.7 kg    BMI:  Body mass index is  22.89 kg/m.  Estimated Nutritional Needs:  Kcal:  2050-2250 Protein:  100-115g Fluid:  2.1L/day    Shelle Iron RD, LDN For contact information, refer to [facility].

## 2023-01-21 NOTE — Progress Notes (Signed)
eLink Physician-Brief Progress Note Patient Name: Drae Mitzel DOB: [birth year] MRN: [id number]   Date of Service  01/21/2023  HPI/Events of Note  Hemoglobin dropped from 9.6 to 7.3 gm / dl, no overt  evidence of bleeding.  eICU Interventions  Stool occult blood, reticulocyte count, LDH, Haptoglobin, serial CBC.        Raziel Koenigs U Neysha Criado 01/21/2023, 1:42 AM

## 2023-01-21 NOTE — Progress Notes (Signed)
PHARMACY - TOTAL PARENTERAL NUTRITION CONSULT NOTE   Indication: Intolerance to enteral feeding, Duodenal obstruction  Patient Measurements: Height:  (185.4 cm) Weight: 78.7 kg (173 lb 8 oz) IBW/kg (Calculated) : 79.9 TPN AdjBW (KG): 68.1 Body mass index is 22.89 kg/m.   Assessment: Pt is a [name] admitted on 4/8 with N/V, chronic partial SBO, history of multiple previous abdominal surgeries. Pt has very minimal PO intake with ongoing weight loss. Pharmacy consulted to manage TPN.   Glucose / Insulin: No history of DM. Goal CBG: <180 -CBG range: 75-122 after restarting TPN on 4/15, 0 unit SSI required.  Electrolytes: Na remains low (D10 infused while TPN was off), K up to 4.6, Cl low, CorrCa 8.4, Mag 1.8 Renal: SCr WNL/stable, BUN 42 Hepatic: LFTs, Tbili, Alk Phos, all WNL. Albumin 2.2 Intake / Output; MIVF:  - UOP: 4925 mL/24, NG output 459 mL - LBM 4/14 -mIVF: Massive transfusion protocol 4/15 AM, transfusing 1 unit PRBC 4/16.   GI Imaging:  -4/4 UGI: Findings suggestive of chronic partial small bowel obstruction or ileus -4/9 CT Abd: Ileus vs high grade partial SBO GI Surgeries / Procedures:  -4/11 EGD: Gastric and small bowel dilation. Could have functional afferent loop syndrome or internal hernia 4/14 emergent bedside EGD:  oozing cratered gastric ulcer w/ oozing hemorrhage> inj w/ epi & clips placed - 4/15 IR:  IR for emergency embolization -4/15: Anticipated ex lap will be rescheduled d/t shock  Central access: Double lumen PICC placed 4/8 TPN start date: 4/9  Nutritional Goals:  Goal TPN rate is 85 mL/hr (provides 112 g of protein and 2101 kcals per day)  RD Assessment: Estimated Needs Total Energy Estimated Needs: 2050-2250 Total Protein Estimated Needs: 100-115g Total Fluid Estimated Needs: 2.1L/day  Current Nutrition:  NPO  TPN  Plan:  At 1800: Continue TPN @ goal rate of 85 mL/hr  Electrolytes in TPN: Na 150 mEq/L, K 30 mEq/L, Ca 10 mEq/L, Mg  71mEq/L, and Phos 18 mmol/L EA:[id number] 2:1 Add standard MVI and trace elements to TPN Completed 5 days thiamine 4/13 Continue Sensitive q8h SSI and adjust as needed  mIVF per MD Monitor TPN labs on Mon/Thurs and PRN   Lynann Beaver PharmD, BCPS [facility] main pharmacy [phone] 01/21/2023 7:30 AM

## 2023-01-21 NOTE — Progress Notes (Signed)
2 Days Post-Op  Subjective: Hgb 7.3 overnight (from 9.3 yesterday afternoon). Transfused 1u PRBCs. Weaning pressors. Alert and following commands this morning.    Objective: Vital signs in last 24 hours: Temp:  [92.7 F (33.7 C)-99.3 F (37.4 C)] 99 F (37.2 C) (04/16 0530) Pulse Rate:  [51-78] 55 (04/16 0530) Resp:  [14-20] 18 (04/16 0530) SpO2:  [99 %-100 %] 100 % (04/16 0530) Arterial Line BP: (95-152)/(40-73) 127/52 (04/16 0530) FiO2 (%):  [40 %-100 %] 40 % (04/16 0445) Weight:  [78.7 kg] 78.7 kg (04/16 0500) Last BM Date : 01/20/23  Intake/Output from previous day: 04/15 0701 - 04/16 0700 In: 6546.6 [I.V.:5876.7; Blood:320; IV Piggyback:349.9] Out: 1884 [Urine:4925; Emesis/NG output:459] Intake/Output this shift: Total I/O In: 2214 [I.V.:1845.9; Blood:320; IV Piggyback:48.1] Out: 1784 [Urine:1775; Emesis/NG output:9]  PE: General: intubated, alert Neuro: GCS 11T HEENT: NG tube with blood-tinged drainage Resp: .ETT in place Vent Mode: PRVC FiO2 (%):  [40 %-100 %] 40 % Set Rate:  [18 bmp] 18 bmp Vt Set:  [630 mL] 630 mL PEEP:  [5 cmH20] 5 cmH20 Plateau Pressure:  [15 cmH20-20 cmH20] 15 cmH20 Abdomen: soft, nondistended, nontender to palpation Extremities: warm and well-perfused GU: foley draining clear yellow urine   Lab Results:  Recent Labs    01/20/23 1702 01/20/23 2251  WBC 8.8 7.8  HGB 9.6* 7.3*  HCT 27.4* 21.1*  PLT 75* 58*    BMET Recent Labs    01/20/23 1011 01/20/23 1622  NA 133* 134*  K 4.2 4.1  CL 94* 98  CO2 32 31  GLUCOSE 203* 121*  BUN 43* 45*  CREATININE 0.83 0.84  CALCIUM 6.7* 6.8*   PT/INR Recent Labs    01/20/23 1130 01/20/23 1623  LABPROT 16.7* 14.8  INR 1.4* 1.2   CMP     Component Value Date/Time   NA 134 (L) 01/20/2023 1622   K 4.1 01/20/2023 1622   CL 98 01/20/2023 1622   CO2 31 01/20/2023 1622   GLUCOSE 121 (H) 01/20/2023 1622   BUN 45 (H) 01/20/2023 1622   CREATININE 0.84 01/20/2023 1622   CALCIUM  6.8 (L) 01/20/2023 1622   PROT 3.9 (L) 01/20/2023 1622   ALBUMIN 2.2 (L) 01/20/2023 1622   AST 20 01/20/2023 1622   ALT 20 01/20/2023 1622   ALKPHOS 32 (L) 01/20/2023 1622   BILITOT 1.0 01/20/2023 1622   GFRNONAA >60 01/20/2023 1622   GFRAA >60 09/23/2017 0507   Lipase  No results found for: "LIPASE"     Studies/Results: DG CHEST PORT 1 VIEW  Result Date: 01/19/2023 CLINICAL DATA:  Intubation EXAM: PORTABLE CHEST 1 VIEW COMPARISON:  None Available. FINDINGS: Endotracheal tube with tip just below the clavicular heads. An enteric tube tip and side-port are at the stomach. Right-sided central line with tip at the upper cavoatrial junction. Gas dilated bowel in the upper abdomen, there has been recent abdominal CT. There is no edema, consolidation, effusion, or pneumothorax. IMPRESSION: Unremarkable hardware. Clear lungs. Electronically Signed   By: Tiburcio Pea M.D.   On: 01/19/2023 07:07    Anti-infectives: Anti-infectives (From admission, onward)    Start     Dose/Rate Route Frequency Ordered Stop   01/19/23 0600  cefTRIAXone (ROCEPHIN) 2 g in sodium chloride 0.9 % 100 mL IVPB       See Hyperspace for full Linked Orders Report.   2 g 200 mL/hr over 30 Minutes Intravenous On call to O.R. 01/19/23 1660 01/19/23 0606   01/19/23 0350  metroNIDAZOLE (FLAGYL) IVPB 500 mg  Status:  Discontinued       See Hyperspace for full Linked Orders Report.   500 mg 100 mL/hr over 60 Minutes Intravenous Every 8 hours 01/19/23 0350 01/19/23 0752        Assessment/Plan 67 yo male with chronic intermittent bloating, presenting with worsening symptoms, PO intolerance and dehydration. 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 - IR embolization performed 4/15. Required significant  blood products following procedure, however hemodynamics are improving. CBC pending this morning. No bowel movements overnight. - Continue to transfuse as  needed - Wean pressors for MAP>60 - Vent and sedation management per CCM, appreciate assistance. - Will ultimately need surgery for treatment of duodenal obstruction, but this will be deferred until patient is stable with no signs of ongoing bleeding. - Continue TPN - VTE: SCDs; lovenox on hold in setting of acute GI bleed - Dispo: inpatient, ICU    LOS: 8 days    Sophronia Simas, MD [facility] Surgery General, Hepatobiliary and Pancreatic Surgery 01/21/23 6:41 AM

## 2023-01-21 NOTE — Progress Notes (Addendum)
NAME:  Bradley Dominguez, MRN:  [id number], DOB:  03/04/1956, LOS: 8 ADMISSION DATE:  01/13/2023, CONSULTATION DATE: 01/19/2023 REFERRING MD: Dr. Freida Busman, CHIEF COMPLAINT: GI bleed  History of Present Illness:  68 yo M HF, chronic partial small bowel obstruction and multiple previous abd surgeries  who was admitted to [facility] 4/8 from home w n/v/bloating. GI consult 4/10, underwent EGD 4/11 -- concerning for duodenal obstruction. Plan for 4/15 ex lap LOA duodenojejunal bypass.    On 4/14 started vomiting blood, dark red and clots noted. BP on lower end but baseline BP with systolics in 80's to low 90's so not far. Repeat hgb in 8's from 9. Denies pain or nausea, just fatigued. GI reconsulted and considering repeat endoscopy   PCCM consulted.  Pertinent  Medical History   has a past medical history of Arthritis, Bowel obstruction, CHF (congestive heart failure) (11/26/2018), Elevated brain natriuretic peptide (BNP) level (11/25/2018), Family history of adverse reaction to anesthesia, Frequent UTI, History of colon polyps (2009), History of shingles, Plantar fasciitis, Prostate hypertrophy, and Vitamin B 12 deficiency.  Significant Hospital Events: Including procedures, antibiotic start and stop dates in addition to other pertinent events   4/8 Admit 4/11 EGD > duodenal obstruction 4/14 vomiting blood, hypotensive pccm consult  4/15 Significant hematemesis/hematochezia. Decompensated. Intubated. To IR for embolization of jejunal arcade branch. MTP for multiple products.  4/15 Significant hematemesis / hematochezia, decompensated > intubated. IR for embolization of jejunal arcade branch, MTP required, TXA.  Interim History / Subjective:  Tmax 99.3  I/O 4.9L UOP, +1.2L in last 24 hours Vent 40%, PEEP 5  Levophed  Pt reports itching Remains on low dose dilaudid   Objective   Blood pressure (!) 92/51, pulse 63, temperature 99.3 F (37.4 C), resp. rate 18, height  (1.854 m), weight 78.7 kg, SpO2  100 %.    Vent Mode: PRVC FiO2 (%):  [40 %-60 %] 40 % Set Rate:  [18 bmp] 18 bmp Vt Set:  [630 mL] 630 mL PEEP:  [5 cmH20] 5 cmH20 Plateau Pressure:  [15 cmH20-17 cmH20] 17 cmH20   Intake/Output Summary (Last 24 hours) at 01/21/2023 0957 Last data filed at 01/21/2023 0930 Gross per 24 hour  Intake 4294.93 ml  Output 3734 ml  Net 560.93 ml   Filed Weights   01/18/23 0500 01/19/23 0702 01/21/23 0500  Weight: 69.2 kg 67.2 kg 78.7 kg    Examination:  General: critically ill appearing adult male lying in bed on vent in NAD HEENT: MM pink/moist, anicteric, NGT in place with small amt bloody secretions Neuro: on dilaudid infusion, appropriate in interaction, writes messages  CV: s1s2 RRR, no m/r/g PULM: non-labored at rest, lungs bilaterally clear GI: soft, bsx4 hypoactive Extremities: warm/dry, trace dependent edema  Skin: no rashes or lesions  Resolved Hospital Problem list     Assessment & Plan:   Hemorrhagic Shock  Acute Blood Loss Anemia DIC Secondary to gastric ulcer s/p clip 4/14 and 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 Required MTP protocol, IR for embolization.  -appreciate IR, [facility], GI assistance with patient care  -wean vasopressors for MAP >65 -follow H/H  Acute Respiratory Failure with Hypoxia Intubated 4/15 in setting of hemorrhagic shock. Pending return to OR for duodenal obstruction at some point.  -PRVC 8cc/kg -wean PEEP / fiO2 for sats >90% -PAD protocol for RASS Goal -1 to -2 -dilaudid for pain/sedation  -PRN benadryl for itching  -hold SBT given instability  -would consider holding extubation until  definitive OR time for duodenal obstruction  -follow CXR   Duodenal Obstruction Nonfunctioning prior GJ bypass. Initial plan was for surgery 4/15, but developed hemorrhagic shock -defer timing of OR to [facility] -NPO  -continue TPN for nutrition  -follow up path from EGD  Hyponatremia  -follow BMP   Protein Calorie  Malnutrition -TPN per pharmacy   Central Access -has PICC and R FEM TLC (placed 4/15 by IR), would keep TLC until 4/17 to ensure stability / no further bleeding   Best Practice (right click and "Reselect all SmartList Selections" daily)  Diet/type: TPN DVT prophylaxis: SCD GI prophylaxis: PPI Lines: Central line, PICC Foley:  N/A Code Status:  full code Last date of multidisciplinary goals of care discussion: Wife, Bradley Dominguez, updated at bedside   Critical Care Time: 34 minutes   Canary Brim, MSN, APRN, NP-C, AGACNP-BC [facility] 01/21/2023, 9:57 AM   Please see [email or web address] for pager details.   From 7A-7P if no response, please call [phone] After hours, please call ELink [phone]

## 2023-01-21 NOTE — Progress Notes (Signed)
Arterial line has good waveform, draws and flushes blood well, insertion site is within normal limits at this time.

## 2023-01-21 NOTE — Progress Notes (Signed)
1 Unit PRBC infusing and labs done as ordered with exception of stool occult blood. Have notified Elink RN of safety concerns about rectal exam and she will update provider.

## 2023-01-22 ENCOUNTER — Inpatient Hospital Stay (HOSPITAL_COMMUNITY): Payer: BC Managed Care – PPO

## 2023-01-22 DIAGNOSIS — J9601 Acute respiratory failure with hypoxia: Secondary | ICD-10-CM | POA: Diagnosis not present

## 2023-01-22 DIAGNOSIS — E43 Unspecified severe protein-calorie malnutrition: Secondary | ICD-10-CM | POA: Diagnosis not present

## 2023-01-22 DIAGNOSIS — R578 Other shock: Secondary | ICD-10-CM | POA: Diagnosis not present

## 2023-01-22 DIAGNOSIS — R609 Edema, unspecified: Secondary | ICD-10-CM

## 2023-01-22 DIAGNOSIS — K315 Obstruction of duodenum: Secondary | ICD-10-CM | POA: Diagnosis present

## 2023-01-22 DIAGNOSIS — K2901 Acute gastritis with bleeding: Secondary | ICD-10-CM | POA: Diagnosis not present

## 2023-01-22 LAB — COMPREHENSIVE METABOLIC PANEL
ALT: 19 U/L (ref 0–44)
AST: 19 U/L (ref 15–41)
Albumin: 2.3 g/dL — ABNORMAL LOW (ref 3.5–5.0)
Alkaline Phosphatase: 36 U/L — ABNORMAL LOW (ref 38–126)
Anion gap: 5 (ref 5–15)
BUN: 35 mg/dL — ABNORMAL HIGH (ref 8–23)
CO2: 30 mmol/L (ref 22–32)
Calcium: 7.7 mg/dL — ABNORMAL LOW (ref 8.9–10.3)
Chloride: 103 mmol/L (ref 98–111)
Creatinine, Ser: 0.85 mg/dL (ref 0.61–1.24)
GFR, Estimated: >60 mL/min (ref >60)
Glucose, Bld: 109 mg/dL — ABNORMAL HIGH (ref 70–99)
Potassium: 4.6 mmol/L (ref 3.5–5.1)
Sodium: 138 mmol/L (ref 135–145)
Total Bilirubin: 0.6 mg/dL (ref 0.3–1.2)
Total Protein: 4.3 g/dL — ABNORMAL LOW (ref 6.5–8.1)

## 2023-01-22 LAB — CBC
HCT: 33.2 % — ABNORMAL LOW (ref 39.0–52.0)
Hemoglobin: 10.3 g/dL — ABNORMAL LOW (ref 13.0–17.0)
MCH: 32.3 pg (ref 26.0–34.0)
MCHC: 31 g/dL (ref 30.0–36.0)
MCV: 104.1 fL — ABNORMAL HIGH (ref 80.0–100.0)
Platelets: 75 10*3/uL — ABNORMAL LOW (ref 150–400)
RBC: 3.19 MIL/uL — ABNORMAL LOW (ref 4.22–5.81)
RDW: 14.8 % (ref 11.5–15.5)
WBC: 6 10*3/uL (ref 4.0–10.5)
nRBC: 0 % (ref 0.0–0.2)

## 2023-01-22 LAB — GLUCOSE, CAPILLARY
Glucose-Capillary: 112 mg/dL — ABNORMAL HIGH (ref 70–99)
Glucose-Capillary: 108 mg/dL — ABNORMAL HIGH (ref 70–99)
Glucose-Capillary: 125 mg/dL — ABNORMAL HIGH (ref 70–99)

## 2023-01-22 LAB — HEMOGLOBIN AND HEMATOCRIT, BLOOD
HCT: 31 % — ABNORMAL LOW (ref 39.0–52.0)
HCT: 31.7 % — ABNORMAL LOW (ref 39.0–52.0)
HCT: 33.4 % — ABNORMAL LOW (ref 39.0–52.0)
Hemoglobin: 10 g/dL — ABNORMAL LOW (ref 13.0–17.0)
Hemoglobin: 10.3 g/dL — ABNORMAL LOW (ref 13.0–17.0)
Hemoglobin: 10.7 g/dL — ABNORMAL LOW (ref 13.0–17.0)

## 2023-01-22 LAB — TYPE AND SCREEN
ABO/RH(D): O NEG
ABO/RH(D): O NEG
Antibody Screen: NEGATIVE

## 2023-01-22 LAB — BPAM RBC
Blood Product Expiration Date: 202405162359
Blood Product Expiration Date: 202405182359
ISSUE DATE / TIME: 202404140307
ISSUE DATE / TIME: 202404150043
ISSUE DATE / TIME: 202404150217
Unit Type and Rh: 5100
Unit Type and Rh: 9500
Unit Type and Rh: 9500

## 2023-01-22 LAB — CBC WITH DIFFERENTIAL/PLATELET
Abs Immature Granulocytes: 0.03 10*3/uL (ref 0.00–0.07)
Basophils Absolute: 0 10*3/uL (ref 0.0–0.1)
Basophils Relative: 0 %
Eosinophils Absolute: 0.2 10*3/uL (ref 0.0–0.5)
Eosinophils Relative: 3 %
HCT: 31.3 % — ABNORMAL LOW (ref 39.0–52.0)
Hemoglobin: 10.1 g/dL — ABNORMAL LOW (ref 13.0–17.0)
Immature Granulocytes: 1 %
Lymphocytes Relative: 17 %
Lymphs Abs: 1 10*3/uL (ref 0.7–4.0)
MCH: 30.1 pg (ref 26.0–34.0)
MCHC: 32.3 g/dL (ref 30.0–36.0)
MCV: 93.4 fL (ref 80.0–100.0)
Monocytes Absolute: 0.5 10*3/uL (ref 0.1–1.0)
Monocytes Relative: 8 %
Neutro Abs: 4.3 10*3/uL (ref 1.7–7.7)
Neutrophils Relative %: 71 %
Platelets: 74 10*3/uL — ABNORMAL LOW (ref 150–400)
RBC: 3.35 MIL/uL — ABNORMAL LOW (ref 4.22–5.81)
RDW: 14.6 % (ref 11.5–15.5)
WBC: 6.1 10*3/uL (ref 4.0–10.5)
nRBC: 0 % (ref 0.0–0.2)

## 2023-01-22 MED ORDER — ORAL CARE MOUTH RINSE: 15.0000 mL | OROMUCOSAL | Status: DC | PRN

## 2023-01-22 MED ORDER — TRAVASOL 10 % IV SOLN
INTRAVENOUS | Status: AC
  Filled 2023-01-22: qty 1122

## 2023-01-22 NOTE — Progress Notes (Signed)
PHARMACY - TOTAL PARENTERAL NUTRITION CONSULT NOTE   Indication: Intolerance to enteral feeding, Duodenal obstruction  Patient Measurements: Height:  (185.4 cm) Weight: 73.8 kg (162 lb 11.2 oz) IBW/kg (Calculated) : 79.9 TPN AdjBW (KG): 68.1 Body mass index is 21.47 kg/m.   Assessment: Pt is a 35 yoM admitted on 4/8 with N/V, chronic partial SBO, history of multiple previous abdominal surgeries. Pt has very minimal PO intake with ongoing weight loss. Pharmacy consulted to manage TPN.   Glucose / Insulin: No history of DM. Goal CBG: <180 -CBG range: 82-112, 0 unit SSI required.  Electrolytes: WNL including Na (improved), K (4.6), Cl, CO2, CorrCa 9 Renal: SCr WNL/stable, BUN 35 Hepatic: LFTs, Tbili, Alk Phos, all WNL. Albumin 2.3 Intake / Output; MIVF: net I/O -6.5 L - UOP: 8000 mL/24, NG output 1200 mL - LBM 4/14 -mIVF: NS 10 ml/hr (s/p massive transfusion protocol 4/15 AM, 1 unit PRBC 4/16) GI Imaging:  -4/4 UGI: Findings suggestive of chronic partial small bowel obstruction or ileus -4/9 CT Abd: Ileus vs high grade partial SBO GI Surgeries / Procedures:  -4/11 EGD: Gastric and small bowel dilation. Could have functional afferent loop syndrome or internal hernia 4/14 emergent bedside EGD:  oozing cratered gastric ulcer w/ oozing hemorrhage> inj w/ epi & clips placed - 4/15 IR:  IR for emergency embolization -4/15: Anticipated ex lap will be rescheduled d/t shock  Central access: Double lumen PICC placed 4/8 TPN start date: 4/9  Nutritional Goals:  Goal TPN rate is 85 mL/hr (provides 112 g of protein and 2101 kcals per day)  RD Assessment: Estimated Needs Total Energy Estimated Needs: 2050-2250 Total Protein Estimated Needs: 100-115g Total Fluid Estimated Needs: 2.1L/day  Current Nutrition:  NPO  TPN  Plan:  At 1800: Continue TPN @ goal rate of 85 mL/hr  Electrolytes in TPN: Na 150 mEq/L, K 30 mEq/L, Ca 10 mEq/L, Mg 9mEq/L, and Phos 18 mmol/L JY:NWGNFAO  2:1 Add standard MVI and trace elements to TPN Completed 5 days thiamine 4/13 Continue Sensitive q8h SSI and adjust as needed  mIVF per MD Monitor TPN labs on Mon/Thurs and PRN   Lynann Beaver PharmD, BCPS [facility] main pharmacy [phone] 01/22/2023 8:19 AM

## 2023-01-22 NOTE — Progress Notes (Signed)
Left upper extremity venous study completed.   Preliminary results relayed to RN.  Please see CV Procedures for preliminary results.  William Laske, RVT  1:50 PM 01/22/23

## 2023-01-22 NOTE — Progress Notes (Signed)
3 Days Post-Op  Subjective: Increase in dark red drainage from NG overnight. Hgb remains stable at 10, pressor requirement has significantly decreased. On levo at 8, epi and vasopressin have been off since yesterday morning.   Objective: Vital signs in last 24 hours: Temp:  [98.8 F (37.1 C)-99.9 F (37.7 C)] 99.7 F (37.6 C) (04/17 0719) Pulse Rate:  [52-91] 81 (04/17 0719) Resp:  [14-25] 14 (04/17 0719) SpO2:  [97 %-100 %] 98 % (04/17 0719) Arterial Line BP: (72-135)/(41-62) 105/49 (04/17 0719) FiO2 (%):  [30 %-40 %] 30 % (04/17 0409) Weight:  [73.8 kg] 73.8 kg (04/17 0500) Last BM Date : 01/20/23  Intake/Output from previous day: 04/16 0701 - 04/17 0700 In: 2666.8 [I.V.:2666.8] Out: 9200 [Urine:8000; Emesis/NG output:1200] Intake/Output this shift: No intake/output data recorded.  PE: General: intubated, alert, follows commands Neuro: GCS 11T HEENT: NG tube with dark drainage Resp: .ETT in place Vent Mode: PRVC FiO2 (%):  [30 %-40 %] 30 % Set Rate:  [18 bmp] 18 bmp Vt Set:  [630 mL] 630 mL PEEP:  [5 cmH20] 5 cmH20 Plateau Pressure:  [15 cmH20-17 cmH20] 15 cmH20 Abdomen: soft, nondistended, nontender to palpation Extremities: warm and well-perfused GU: foley draining clear yellow urine   Lab Results:  Recent Labs    01/21/23 1045 01/22/23 0515 01/22/23 0555  WBC 6.5 6.1  --   HGB 9.6* 10.1* 10.0*  HCT 27.9* 31.3* 31.0*  PLT 64* 74*  --     BMET Recent Labs    01/21/23 0630 01/22/23 0515  NA 130* 138  K 4.6 4.6  CL 94* 103  CO2 31 30  GLUCOSE 129* 109*  BUN 42* 35*  CREATININE 0.80 0.85  CALCIUM 7.0* 7.7*   PT/INR Recent Labs    01/20/23 1130 01/20/23 1623  LABPROT 16.7* 14.8  INR 1.4* 1.2   CMP     Component Value Date/Time   NA 138 01/22/2023 0515   K 4.6 01/22/2023 0515   CL 103 01/22/2023 0515   CO2 30 01/22/2023 0515   GLUCOSE 109 (H) 01/22/2023 0515   BUN 35 (H) 01/22/2023 0515   CREATININE 0.85 01/22/2023 0515    CALCIUM 7.7 (L) 01/22/2023 0515   PROT 4.3 (L) 01/22/2023 0515   ALBUMIN 2.3 (L) 01/22/2023 0515   AST 19 01/22/2023 0515   ALT 19 01/22/2023 0515   ALKPHOS 36 (L) 01/22/2023 0515   BILITOT 0.6 01/22/2023 0515   GFRNONAA >60 01/22/2023 0515   GFRAA >60 09/23/2017 0507   Lipase  No results found for: "LIPASE"     Studies/Results: No results found.  Anti-infectives: Anti-infectives (From admission, onward)    Start     Dose/Rate Route Frequency Ordered Stop   01/19/23 0600  cefTRIAXone (ROCEPHIN) 2 g in sodium chloride 0.9 % 100 mL IVPB       See Hyperspace for full Linked Orders Report.   2 g 200 mL/hr over 30 Minutes Intravenous On call to O.R. 01/19/23 0350 01/19/23 0606   01/19/23 0350  metroNIDAZOLE (FLAGYL) IVPB 500 mg  Status:  Discontinued       See Hyperspace for full Linked Orders Report.   500 mg 100 mL/hr over 60 Minutes Intravenous Every 8 hours 01/19/23 0350 01/19/23 0752        Assessment/Plan 67 yo male with chronic intermittent bloating, presenting with worsening symptoms, PO intolerance and dehydration. 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 -  IR embolization performed 4/15. Hemodynamics have improved with resuscitation. NG output has increased and is very dark, however hgb remains stable, suspect this is old blood draining. No other signs of ongoing bleeding. - Wean levophed for MAP>60 - Vent and sedation management per CCM, appreciate assistance. Ok for extubation when deemed appropriate by CCM. - Tentatively plan for surgery early next week for exploratory laparotomy, for treatment of duodenal obstruction. Discussed with patient and his wife at bedside this morning. - Continue TPN, remain NPO if extubated. - VTE: SCDs; lovenox on hold in setting of acute GI bleed - Dispo: inpatient, ICU    LOS: 9 days    Sophronia Simas, MD [facility] General, Hepatobiliary and  Pancreatic Surgery 01/22/23 7:28 AM

## 2023-01-22 NOTE — Progress Notes (Signed)
PT Cancellation Note  Patient Details Name: Bradley Dominguez MRN: [id number] DOB: 1956/03/18   Cancelled Treatment:    Reason Eval/Treat Not Completed: Medical issues which prohibited therapy Pt extubated earlier today.  Spoke with RN who reports pt still passing blood and would be more appropriate for PT tomorrow.  PT agrees.  Anise Salvo, PT Acute Rehab [facility] [phone]   Rayetta Humphrey 01/22/2023, 4:25 PM

## 2023-01-22 NOTE — Progress Notes (Signed)
45ml of HYDROmorphone (DILAUDID) 50 mg in 50 mL NS ( /mL) premix infusion wasted in [place], verified with Luna Glasgow, RN.

## 2023-01-22 NOTE — Progress Notes (Signed)
NAME:  Bradley Dominguez, MRN:  [id number], DOB:  [birth year], LOS: 9 ADMISSION DATE:  01/13/2023, CONSULTATION DATE: 01/19/2023 REFERRING MD: Dr. Freida Busman, CHIEF COMPLAINT: GI bleed  History of Present Illness:  67 yo M HF, chronic partial small bowel obstruction and multiple previous abd surgeries  who was admitted to [facility] 4/8 from home w n/v/bloating. GI consult 4/10, underwent EGD 4/11 -- concerning for duodenal obstruction. Plan for 4/15 ex lap LOA duodenojejunal bypass.    On 4/14 started vomiting blood, dark red and clots noted. BP on lower end but baseline BP with systolics in 80's to low 90's so not far. Repeat hgb in 8's from 9. Denies pain or nausea, just fatigued. GI reconsulted and considering repeat endoscopy   PCCM consulted.  Pertinent  Medical History   has a past medical history of Arthritis, Bowel obstruction, CHF (congestive heart failure) (11/26/2018), Elevated brain natriuretic peptide (BNP) level (11/25/2018), Family history of adverse reaction to anesthesia, Frequent UTI, History of colon polyps (2009), History of shingles, Plantar fasciitis, Prostate hypertrophy, and Vitamin B 12 deficiency.  Significant Hospital Events: Including procedures, antibiotic start and stop dates in addition to other pertinent events   4/8 Admit 4/11 EGD > duodenal obstruction 4/14 vomiting blood, hypotensive pccm consult 4/15 Significant hematemesis / hematochezia, decompensated > intubated. IR for embolization of jejunal arcade branch, MTP required, TXA.  Interim History / Subjective:  Tmax 99.7 I/O +6.3L UOP, -6L in last 24 hours Awake on the vent, no distress. No complaints.   Objective   Blood pressure (!) 92/51, pulse 73, temperature 99.7 F (37.6 C), resp. rate 18, height  (1.854 m), weight 73.8 kg, SpO2 98 %.    Vent Mode: PRVC FiO2 (%):  [30 %-40 %] 30 % Set Rate:  [18 bmp] 18 bmp Vt Set:  [630 mL] 630 mL PEEP:  [5 cmH20] 5 cmH20 Plateau Pressure:  [15 cmH20-16 cmH20] 16  cmH20   Intake/Output Summary (Last 24 hours) at 01/22/2023 0841 Last data filed at 01/22/2023 0725 Gross per 24 hour  Intake 2601.48 ml  Output 9200 ml  Net -6598.52 ml    Filed Weights   01/19/23 0702 01/21/23 0500 01/22/23 0500  Weight: 67.2 kg 78.7 kg 73.8 kg    Examination:  General: Middle aged male in NAD on vent HEENT: [place]/AT, PERRL, no JVD. ETT. NGT with dark brown secretions.  Neuro: Alert, interactive, non-focal.  CV: RRR, no MRG PULM: Clear bilateral breath sounds GI: Soft, NT, ND Extremities: No acute deformity. 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 Required MTP protocol, IR for embolization.  -appreciate IR, [facility], GI assistance with patient care  -wean vasopressors for MAP >65, slightly increasing dose this morning.  -follow H/H -no indication for transfusion this morning.   Acute Respiratory Failure with Hypoxia Intubated 4/15 in setting of hemorrhagic shock. Pending return to OR for duodenal obstruction at some point.  -PRVC 8cc/kg -wean PEEP / fiO2 for sats >90% -PAD protocol for RASS Goal -1 to -2 -Dilaudid turned off this morning for SBT -PRN benadryl for itching  -If passes SBT we will plan to extubate with the blessing of Dr. Freida Busman. Sounds like surgery  will be next week at the earliest.   Duodenal Obstruction Nonfunctioning prior GJ bypass. Initial plan was for surgery 4/15, but developed hemorrhagic shock -defer timing of OR to [facility] -NPO  -continue TPN for nutrition  -EGD path negative  Protein Calorie Malnutrition -TPN per pharmacy   Central Access -has PICC and R FEM TLC (placed 4/15 by IR) -If he is on the pathway to extubate and repeat H&H is  stable we will remove the fem line.    Best Practice (right click and "Reselect all SmartList Selections" daily)  Diet/type: TPN DVT prophylaxis: SCD GI prophylaxis: PPI BID Lines: Central line and PICC Foley:  N/A Code Status:  full code Last date of multidisciplinary goals of care discussion:  4/16  Critical Care Time: 37 minutes    Joneen Roach, AGACNP-BC [facility]  See Amion for personal pager PCCM on call pager [phone] until 7pm. Please call Elink 7p-7a. [phone]  01/22/2023 9:07 AM

## 2023-01-22 NOTE — Progress Notes (Signed)
Increase of bloody secretions out of NGT overnight. Patient put out about 450cc in 4 hours. H& H drawn, another 550cc of dark bloody output over the next 2 hours, H&H redrawn per ELink, Hgb stable @ 10.

## 2023-01-22 NOTE — Progress Notes (Signed)
eLink Physician-Brief Progress Note Patient Name: Bradley Dominguez DOB: March 23, 1956 MRN: [id number]   Date of Service  01/22/2023  HPI/Events of Note  NG tube output of approximately 450 cc earlier today (past 5 hours), however Hemoglobin and blood pressure have been stable.  eICU Interventions  Protonix 40 mg Q 12 hours, Trend H & H, guaiac gastric contents.        Bradley Dominguez 01/22/2023, 5:31 AM

## 2023-01-22 NOTE — Procedures (Signed)
Extubation Procedure Note  Patient Details:   Name: Bradley Dominguez DOB: 1955/12/16 MRN: [id number]   Airway Documentation:    Vent end date: 01/22/23 Vent end time: 1148   Evaluation  O2 sats: stable throughout Complications: No apparent complications Patient did tolerate procedure well. Bilateral Breath Sounds: Clear, Diminished   Yes  Pt extubated to 3L [place] per CCMD order. Pt tolerated procedure well, current saturations are 99%. Pt was suctioned and had a positive cuff leak prior to extubation. Pt was able to speak afterwards and no stridor is heard at this time. Vitals are stable at this time, RN at pt bedside.   [name] A Bobi Daudelin 01/22/2023, 11:54 AM

## 2023-01-22 NOTE — Progress Notes (Signed)
Continued dark red output from NGT, color slightly different at suction tubing, critical care team made aware and assessed at bedside. Pressor requirement lower, no changes in vital signs, hemoglobin remains stable, last reading 10.7.

## 2023-01-23 ENCOUNTER — Other Ambulatory Visit: Payer: Self-pay

## 2023-01-23 DIAGNOSIS — R578 Other shock: Secondary | ICD-10-CM | POA: Diagnosis not present

## 2023-01-23 DIAGNOSIS — K2901 Acute gastritis with bleeding: Secondary | ICD-10-CM | POA: Diagnosis not present

## 2023-01-23 LAB — PREPARE FRESH FROZEN PLASMA
Unit division: 0
Unit division: 0
Unit division: 0
Unit division: 0

## 2023-01-23 LAB — BPAM RBC
Blood Product Expiration Date: 202404242359
Blood Product Expiration Date: 202405132359
Blood Product Expiration Date: 202405132359
Blood Product Expiration Date: 202405162359
Blood Product Expiration Date: 202405162359
Blood Product Expiration Date: 202405172359
Blood Product Expiration Date: 202405172359
Blood Product Expiration Date: 202405182359
Blood Product Expiration Date: 202405222359
ISSUE DATE / TIME: 202404150043
ISSUE DATE / TIME: 202404150748
ISSUE DATE / TIME: 202404151151
ISSUE DATE / TIME: 202404151954
ISSUE DATE / TIME: 202404160147
ISSUE DATE / TIME: 202404170735
Unit Type and Rh: 5100
Unit Type and Rh: 5100
Unit Type and Rh: 5100
Unit Type and Rh: 5100
Unit Type and Rh: 9500
Unit Type and Rh: 9500
Unit Type and Rh: 9500
Unit Type and Rh: 9500
Unit Type and Rh: 9500
Unit Type and Rh: 9500
Unit Type and Rh: 9500

## 2023-01-23 LAB — CBC
HCT: 34.3 % — ABNORMAL LOW (ref 39.0–52.0)
HCT: 33.2 % — ABNORMAL LOW (ref 39.0–52.0)
Hemoglobin: 10.2 g/dL — ABNORMAL LOW (ref 13.0–17.0)
Hemoglobin: 10.7 g/dL — ABNORMAL LOW (ref 13.0–17.0)
MCH: 33.7 pg (ref 26.0–34.0)
MCH: 30.6 pg (ref 26.0–34.0)
MCHC: 29.7 g/dL — ABNORMAL LOW (ref 30.0–36.0)
MCHC: 32.2 g/dL (ref 30.0–36.0)
MCV: 113.2 fL — ABNORMAL HIGH (ref 80.0–100.0)
MCV: 94.9 fL (ref 80.0–100.0)
Platelets: 85 10*3/uL — ABNORMAL LOW (ref 150–400)
Platelets: 82 10*3/uL — ABNORMAL LOW (ref 150–400)
RBC: 3.03 MIL/uL — ABNORMAL LOW (ref 4.22–5.81)
RBC: 3.5 MIL/uL — ABNORMAL LOW (ref 4.22–5.81)
RDW: 15.3 % (ref 11.5–15.5)
RDW: 14.4 % (ref 11.5–15.5)
WBC: 5.7 10*3/uL (ref 4.0–10.5)
WBC: 6.4 10*3/uL (ref 4.0–10.5)
nRBC: 0 % (ref 0.0–0.2)
nRBC: 0 % (ref 0.0–0.2)

## 2023-01-23 LAB — TYPE AND SCREEN
Antibody Screen: NEGATIVE
Unit division: 0
Unit division: 0
Unit division: 0
Unit division: 0
Unit division: 0
Unit division: 0
Unit division: 0
Unit division: 0
Unit division: 0

## 2023-01-23 LAB — BPAM FFP
Blood Product Expiration Date: 202404160124
Blood Product Expiration Date: 202404202359
Blood Product Expiration Date: 202404202359
Blood Product Expiration Date: 202404202359
Blood Product Expiration Date: 202404202359
Blood Product Expiration Date: 202404202359
Blood Product Expiration Date: 202404222359
Blood Product Expiration Date: 202404292359
Blood Product Expiration Date: 202405042359
Blood Product Expiration Date: 202405052359
ISSUE DATE / TIME: 202404150202
ISSUE DATE / TIME: 202404150238
ISSUE DATE / TIME: 202404150246
ISSUE DATE / TIME: 202404150255
ISSUE DATE / TIME: 202404151154
Unit Type and Rh: 5100
Unit Type and Rh: 5100
Unit Type and Rh: 5100
Unit Type and Rh: 5100
Unit Type and Rh: 5100
Unit Type and Rh: 5100
Unit Type and Rh: 6200
Unit Type and Rh: 6200
Unit Type and Rh: 6200

## 2023-01-23 LAB — PROTIME-INR
INR: 1.1 (ref 0.8–1.2)
Prothrombin Time: 14.2 seconds (ref 11.4–15.2)

## 2023-01-23 LAB — COMPREHENSIVE METABOLIC PANEL
ALT: 21 U/L (ref 0–44)
AST: 20 U/L (ref 15–41)
Albumin: 2.4 g/dL — ABNORMAL LOW (ref 3.5–5.0)
Alkaline Phosphatase: 41 U/L (ref 38–126)
Anion gap: 6 (ref 5–15)
BUN: 29 mg/dL — ABNORMAL HIGH (ref 8–23)
CO2: 30 mmol/L (ref 22–32)
Calcium: 7.9 mg/dL — ABNORMAL LOW (ref 8.9–10.3)
Chloride: 101 mmol/L (ref 98–111)
Creatinine, Ser: 0.96 mg/dL (ref 0.61–1.24)
GFR, Estimated: >60 mL/min (ref >60)
Glucose, Bld: 131 mg/dL — ABNORMAL HIGH (ref 70–99)
Potassium: 4.2 mmol/L (ref 3.5–5.1)
Sodium: 137 mmol/L (ref 135–145)
Total Bilirubin: 0.8 mg/dL (ref 0.3–1.2)
Total Protein: 4.8 g/dL — ABNORMAL LOW (ref 6.5–8.1)

## 2023-01-23 LAB — HAPTOGLOBIN: Haptoglobin: 53 mg/dL (ref 32–363)

## 2023-01-23 LAB — GLUCOSE, CAPILLARY
Glucose-Capillary: 117 mg/dL — ABNORMAL HIGH (ref 70–99)
Glucose-Capillary: 126 mg/dL — ABNORMAL HIGH (ref 70–99)
Glucose-Capillary: 109 mg/dL — ABNORMAL HIGH (ref 70–99)

## 2023-01-23 LAB — MAGNESIUM: Magnesium: 1.4 mg/dL — ABNORMAL LOW (ref 1.7–2.4)

## 2023-01-23 LAB — PHOSPHORUS: Phosphorus: 4.5 mg/dL (ref 2.5–4.6)

## 2023-01-23 LAB — TRIGLYCERIDES: Triglycerides: 29 mg/dL (ref <150)

## 2023-01-23 MED ORDER — DIPHENHYDRAMINE HCL 50 MG/ML IJ SOLN
12.5000 mg | Freq: Four times a day (QID) | INTRAMUSCULAR | Status: DC | PRN
  Administered 2023-01-23 – 2023-03-02 (×7): 12.5 mg via INTRAVENOUS
  Filled 2023-01-23 (×8): qty 1

## 2023-01-23 MED ORDER — MENTHOL 3 MG MT LOZG
1.0000 | LOZENGE | OROMUCOSAL | Status: DC | PRN
  Administered 2023-01-25 – 2023-01-26 (×3): 3 mg via ORAL
  Filled 2023-01-23 (×8): qty 9

## 2023-01-23 MED ORDER — MAGNESIUM SULFATE 4 GM/100ML IV SOLN
4.0000 g | Freq: Once | INTRAVENOUS | Status: AC
  Administered 2023-01-23: 4 g via INTRAVENOUS
  Filled 2023-01-23: qty 100

## 2023-01-23 MED ORDER — TRAVASOL 10 % IV SOLN
INTRAVENOUS | Status: AC
  Filled 2023-01-23: qty 1122

## 2023-01-23 NOTE — Evaluation (Signed)
Physical Therapy Evaluation Patient Details Name: Bradley Dominguez MRN: [id number] DOB: 06/02/1956 Today's Date: 01/23/2023  History of Present Illness  67 yo M HF, chronic partial small bowel obstruction and multiple previous abd surgeries  who was admitted  4/8 from home w n/v/bloating. Underwent EGD 4/11 -- concerning for duodenal obstruction. Developed hemorrhagic shock 4/15 in the setting of hematemesis / hematochezia, decompensated requiring, intubation, embolization of jejunal arcade branch. Extubated 01/22/23. Plans are for exploratory  lap 01/27/23.  Clinical Impression  Pt admitted with above diagnosis.  Pt currently with functional limitations due to the deficits listed below (see PT Problem List). Pt will benefit from acute skilled PT to increase their independence and safety with mobility to allow discharge.   The patient motivated to mobilize, required supervision  and assistance with multiple lines and tubes. RN  recommended dangle on bedside for this visit.  Patient sat x 15" and shaved his face. Patient tolerated  well.   BP supine 111/62, sitting after 15" 70/64, no dizziness. HR remained in 60-70 range. SPO2 on 2 L >95%  Patient will benefit from PT while in acute care and  after DC.        Recommendations for follow up therapy are one component of a multi-disciplinary discharge planning process, led by the attending physician.  Recommendations may be updated based on patient status, additional functional criteria and insurance authorization.  Follow Up Recommendations       Assistance Recommended at Discharge Set up Supervision/Assistance  Patient can return home with the following  A little help with walking and/or transfers;A little help with bathing/dressing/bathroom;Assistance with cooking/housework;Assist for transportation;Help with stairs or ramp for entrance    Equipment Recommendations    Recommendations for Other Services       Functional Status Assessment  Patient has had a recent decline in their functional status and demonstrates the ability to make significant improvements in function in a reasonable and predictable amount of time.     Precautions / Restrictions Precautions Precautions: Fall Precaution Comments: NG suction, multiple lines, on levo Restrictions Weight Bearing Restrictions: No      Mobility  Bed Mobility Overal bed mobility: Needs Assistance Bed Mobility: Sit to Supine, Supine to Sit     Supine to sit: Supervision Sit to supine: Supervision   General bed mobility comments: assist to mange lines and tubes for safety    Transfers                   General transfer comment: NT as RN recommended dangle only  while on levo, able to lift self up to scoot along bed to [facility] using arms and legs    Ambulation/Gait                  Stairs            Wheelchair Mobility    Modified Rankin (Stroke Patients Only)       Balance Overall balance assessment: Needs assistance Sitting-balance support: No upper extremity supported, Feet supported Sitting balance-Leahy Scale: Good Sitting balance - Comments: sat on bed edge x 15 " to shave self                                     Pertinent Vitals/Pain Pain Assessment Pain Assessment: No/denies pain    Home Living Family/patient expects to be discharged to:: Private residence Living Arrangements: Spouse/significant other Available  Help at Discharge: Family Type of Home: House Home Access: Stairs to enter   [employer] of Steps: 3   Home Layout: Two level;Able to live on main level with bedroom/bathroom Home Equipment: None      Prior Function Prior Level of Function : Independent/Modified Independent;Driving;Working/employed                     Hand Dominance        Extremity/Trunk Assessment   Upper Extremity Assessment Upper Extremity Assessment: Overall WFL for tasks assessed    Lower  Extremity Assessment Lower Extremity Assessment: Generalized weakness    Cervical / Trunk Assessment Cervical / Trunk Assessment: Normal  Communication   Communication:  (voice hoarse with NG tube)  Cognition Arousal/Alertness: Awake/alert Behavior During Therapy: WFL for tasks assessed/performed, Anxious Overall Cognitive Status: Within Functional Limits for tasks assessed                                 General Comments: with NG tube        General Comments      Exercises     Assessment/Plan    PT Assessment Patient needs continued PT services  PT Problem List Decreased activity tolerance;Decreased mobility       PT Treatment Interventions DME instruction;Therapeutic activities;Gait training;Functional mobility training;Therapeutic exercise;Patient/family education    PT Goals (Current goals can be found in the Care Plan section)  Acute Rehab PT Goals Patient Stated Goal: go home PT Goal Formulation: With patient/family Time For Goal Achievement: 02/06/23 Potential to Achieve Goals: Good    Frequency Min 1X/week     Co-evaluation               AM-PAC PT "6 Clicks" Mobility  Outcome Measure Help needed turning from your back to your side while in a flat bed without using bedrails?: None Help needed moving from lying on your back to sitting on the side of a flat bed without using bedrails?: None Help needed moving to and from a bed to a chair (including a wheelchair)?: None Help needed standing up from a chair using your arms (e.g., wheelchair or bedside chair)?: A Lot Help needed to walk in hospital room?: Total Help needed climbing 3-5 steps with a railing? : Total 6 Click Score: 16    End of Session   Activity Tolerance: Patient tolerated treatment well Patient left: in bed;with call bell/phone within reach;with family/visitor present;with bed alarm set Nurse Communication: Mobility status PT Visit Diagnosis: Unsteadiness on feet  (R26.81);Difficulty in walking, not elsewhere classified (R26.2)    Time: 1308-6578 PT Time Calculation (min) (ACUTE ONLY): 27 min   Charges:   PT Evaluation $PT Eval Low Complexity: 1 Low PT Treatments $Therapeutic Activity: 8-22 mins        Blanchard Kelch PT Acute Rehabilitation Services Office [phone] Weekend [phone]    [name] 01/23/2023, 12:22 PM

## 2023-01-23 NOTE — Progress Notes (Addendum)
NAME:  Bradley Dominguez, MRN:  [id number], DOB:  [birth year], LOS: 10 ADMISSION DATE:  01/13/2023, CONSULTATION DATE: 01/19/2023 REFERRING MD: Dr. Freida Busman, CHIEF COMPLAINT: GI bleed  History of Present Illness:  67 yo M HF, chronic partial small bowel obstruction and multiple previous abd surgeries  who was admitted to [facility] 4/8 from home w n/v/bloating. GI consult 4/10, underwent EGD 4/11 -- concerning for duodenal obstruction. Plan for 4/15 ex lap LOA duodenojejunal bypass.    On 4/14 started vomiting blood, dark red and clots noted. BP on lower end but baseline BP with systolics in 80's to low 90's so not far. Repeat hgb in 8's from 9. Denies pain or nausea, just fatigued. GI reconsulted and considering repeat endoscopy.  Developed hemorrhagic shock 4/15 in the setting of hematemesis / hematochezia, decompensated requiring MTP, intubation, TXA and embolization of jejunal arcade branch.    PCCM consulted.  Pertinent  Medical History   has a past medical history of Arthritis, Bowel obstruction, CHF (congestive heart failure) (11/26/2018), Elevated brain natriuretic peptide (BNP) level (11/25/2018), Family history of adverse reaction to anesthesia, Frequent UTI, History of colon polyps (2009), History of shingles, Plantar fasciitis, Prostate hypertrophy, and Vitamin B 12 deficiency.  Significant Hospital Events: Including procedures, antibiotic start and stop dates in addition to other pertinent events   4/8 Admit 4/11 EGD > duodenal obstruction 4/14 vomiting blood, hypotensive pccm consult 4/15 Significant hematemesis / hematochezia, decompensated > intubated. IR for embolization of jejunal arcade branch, MTP required, TXA.  Interim History / Subjective:  Tmax 99.1 [place] 1 L O2 Glucose range 117-131 UOP 6.2L, NGT 2.6L, -6.2L in last 24 hours   Objective   Blood pressure (!) 140/58, pulse 73, temperature 99.1 F (37.3 C), resp. rate 16, height  (1.854 m), weight 65.7 kg, SpO2 98 %.    Vent  Mode: PRVC FiO2 (%):  [30 %] 30 % Set Rate:  [18 bmp] 18 bmp Vt Set:  [630 mL] 630 mL PEEP:  [5 cmH20] 5 cmH20 Plateau Pressure:  [16 cmH20] 16 cmH20   Intake/Output Summary (Last 24 hours) at 01/23/2023 0735 Last data filed at 01/23/2023 0600 Gross per 24 hour  Intake 2311.22 ml  Output 8825 ml  Net -6513.78 ml   Filed Weights   01/21/23 0500 01/22/23 0500 01/23/23 0500  Weight: 78.7 kg 73.8 kg 65.7 kg    Examination:  General: ill appearing adult male lying in bed in NAD HEENT: MM pink/dry, NGT, wearing glasses  Neuro: AAOx4, speech clear, MAE CV: s1s2 RRR, no m/r/g PULM: non-labored at rest, lungs bilaterally clear  GI: soft, bsx4 active, old abdominal scarring Extremities: warm/dry, no edema  Skin: no rashes or lesions  Resolved Hospital Problem list     Assessment & Plan:   Hemorrhagic Shock  Acute Blood Loss Anemia DIC Secondary to gastric ulcer s/p clip 4/14 and 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 Required MTP protocol, IR for embolization.  -appreciate [facility], IR, GI  -wean vasopressors for MAP >65  -trend H/H -monitor for further bleeding  Acute Respiratory Failure with Hypoxia Intubated 4/15 in setting of hemorrhagic shock. Pending return to OR for duodenal obstruction at some point.  -pulmonary hygiene -IS, mobilize -NPO  -O2 as needed to support sats >90%  Duodenal Obstruction Nonfunctioning prior GJ bypass. Initial plan was for surgery 4/15, but developed hemorrhagic shock. EGD path negative.  -tentative timing of OR for Monday per [facility] -NPO  -continue TPN  -NGT to LIS  Protein Calorie Malnutrition -TPN per pharmacy   Central Access -continue PICC line, request for exchange to 3 lumens per [facility]  -discontinue R fem TLC   Left Arm Swelling, DVT RULED OUT  Superficial Vein Thrombosis Positive Left Basilic Vein, L Cephalic Vein  -warm compress  -elevation   Best Practice (right click and "Reselect all SmartList  Selections" daily)  Diet/type: TPN DVT prophylaxis: SCD GI prophylaxis: PPI BID Lines: Central line and PICC Foley:  N/A Code Status:  full code Last date of multidisciplinary goals of care discussion:  4/16  Critical Care Time: 32 minutes   Canary Brim, MSN, APRN, NP-C, AGACNP-BC [facility] 01/23/2023, 7:35 AM   Please see [email or web address] for pager details.   From 7A-7P if no response, please call [phone] After hours, please call ELink [phone]

## 2023-01-23 NOTE — Progress Notes (Signed)
4 Days Post-Op  Subjective: NG output remains dark red, hgb stable at 10. Extubated yesterday. On low dose levophed.   Objective: Vital signs in last 24 hours: Temp:  [99.1 F (37.3 C)-100.6 F (38.1 C)] 99.1 F (37.3 C) (04/18 0800) Pulse Rate:  [69-89] 71 (04/18 0800) Resp:  [13-24] 19 (04/18 0800) BP: (140)/(58) 140/58 (04/17 1400) SpO2:  [96 %-100 %] 99 % (04/18 0800) Arterial Line BP: (66-139)/(42-69) 111/51 (04/18 0800) Weight:  [65.7 kg] 65.7 kg (04/18 0500) Last BM Date : 01/20/23  Intake/Output from previous day: 04/17 0701 - 04/18 0700 In: 2572.7 [I.V.:2572.7] Out: 8825 [Urine:6225; Emesis/NG output:2600] Intake/Output this shift: Total I/O In: 40 [I.V.:40] Out: -   PE: General: alert and oriented Neuro: no focal deficits HEENT: NG tube with dark drainage Resp: normal work of breathing on nasal cannula Abdomen: soft, nondistended, nontender to palpation Extremities: warm and well-perfused GU: foley draining clear yellow urine   Lab Results:  Recent Labs    01/22/23 2111 01/23/23 0330  WBC 6.0 5.7  HGB 10.3* 10.2*  HCT 33.2* 34.3*  PLT 75* 85*    BMET Recent Labs    01/22/23 0515 01/23/23 0632  NA 138 137  K 4.6 4.2  CL 103 101  CO2 30 30  GLUCOSE 109* 131*  BUN 35* 29*  CREATININE 0.85 0.96  CALCIUM 7.7* 7.9*   PT/INR Recent Labs    01/20/23 1623 01/23/23 0330  LABPROT 14.8 14.2  INR 1.2 1.1   CMP     Component Value Date/Time   NA 137 01/23/2023 0632   K 4.2 01/23/2023 0632   CL 101 01/23/2023 0632   CO2 30 01/23/2023 0632   GLUCOSE 131 (H) 01/23/2023 0632   BUN 29 (H) 01/23/2023 0632   CREATININE 0.96 01/23/2023 0632   CALCIUM 7.9 (L) 01/23/2023 0632   PROT 4.8 (L) 01/23/2023 0632   ALBUMIN 2.4 (L) 01/23/2023 0632   AST 20 01/23/2023 0632   ALT 21 01/23/2023 0632   ALKPHOS 41 01/23/2023 0632   BILITOT 0.8 01/23/2023 0632   GFRNONAA >60 01/23/2023 0632   GFRAA >60 09/23/2017 0507   Lipase  No results found  for: "LIPASE"     Studies/Results: VAS [place] UPPER EXTREMITY VENOUS DUPLEX  Result Date: 01/22/2023 UPPER VENOUS STUDY  Patient Name:  Bradley Dominguez  Date of Exam:   01/22/2023 Medical Rec #: [id number]       Accession #:    [id number] Date of Birth: 09-Apr-1956       Patient Gender: M Patient Age:   67 years Exam Location:  [facility] Procedure:      VAS [place] UPPER EXTREMITY VENOUS DUPLEX Referring Phys: Renae Fickle HOFFMAN --------------------------------------------------------------------------------  Indications: Pain, Swelling, and redness, recent IV sticks Limitations: Bandages and Multiple IV placements. Comparison Study: No previous study. Performing Technologist: McKayla Maag RVT, [place]  Examination Guidelines: A complete evaluation includes B-mode imaging, spectral Doppler, color Doppler, and power Doppler as needed of all accessible portions of each vessel. Bilateral testing is considered an integral part of a complete examination. Limited examinations for reoccurring indications may be performed as noted.  Right Findings: +----------+------------+---------+-----------+----------+-------+ RIGHT     CompressiblePhasicitySpontaneousPropertiesSummary +----------+------------+---------+-----------+----------+-------+ Subclavian    Full       Yes       Yes                      +----------+------------+---------+-----------+----------+-------+  Left Findings: +----------+------------+---------+-----------+----------+-------+ LEFT  CompressiblePhasicitySpontaneousPropertiesSummary +----------+------------+---------+-----------+----------+-------+ IJV           Full       Yes       Yes                      +----------+------------+---------+-----------+----------+-------+ Subclavian    Full       Yes       Yes                      +----------+------------+---------+-----------+----------+-------+ Axillary      Full       Yes       Yes                       +----------+------------+---------+-----------+----------+-------+ Brachial      Full       Yes       Yes                      +----------+------------+---------+-----------+----------+-------+ Radial        Full                                          +----------+------------+---------+-----------+----------+-------+ Ulnar         Full                                          +----------+------------+---------+-----------+----------+-------+ Cephalic      None       No        No                Acute  +----------+------------+---------+-----------+----------+-------+ Basilic       None       No        No                Acute  +----------+------------+---------+-----------+----------+-------+  Summary:  Right: No evidence of thrombosis in the subclavian.  Left: No evidence of deep vein thrombosis in the upper extremity. Findings consistent with acute superficial vein thrombosis involving the left basilic vein (Proximal upper arm to [facility]) and left cephalic vein (Distal upper arm to proximal forearm).  *See table(s) above for measurements and observations.  Diagnosing physician: Gerarda Fraction Electronically signed by Gerarda Fraction on 01/22/2023 at 10:28:01 PM.    Final    DG CHEST PORT 1 VIEW  Result Date: 01/22/2023 CLINICAL DATA:  Respiratory failure EXAM: PORTABLE CHEST 1 VIEW COMPARISON:  01/19/2023 FINDINGS: Subsegmental atelectasis right base. No pneumothorax or pleural effusion identified. Unremarkable cardiac silhouette. NG tube tip below the diaphragm and off the x-ray. Endotracheal tube tip just below the thoracic inlet. IMPRESSION: Right base subsegmental atelectasis. Otherwise no acute cardiopulmonary process identified. Electronically Signed   By: Layla Maw M.D.   On: 01/22/2023 07:56    Anti-infectives: Anti-infectives (From admission, onward)    Start     Dose/Rate Route Frequency Ordered Stop   01/19/23 0600  cefTRIAXone (ROCEPHIN) 2 g in sodium  chloride 0.9 % 100 mL IVPB       See Hyperspace for full Linked Orders Report.   2 g 200 mL/hr over 30 Minutes Intravenous On call to O.R. 01/19/23 0350 01/19/23 0606   01/19/23 0350  metroNIDAZOLE (FLAGYL) IVPB  500 mg  Status:  Discontinued       See Hyperspace for full Linked Orders Report.   500 mg 100 mL/hr over 60 Minutes Intravenous Every 8 hours 01/19/23 0350 01/19/23 0752        Assessment/Plan 67 yo male with chronic intermittent bloating, presenting with worsening symptoms, PO intolerance and dehydration. 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 - IR embolization performed 4/15. Hemodynamics have improved with resuscitation. NG output remains dark, however hgb is stable. Continue to monitor. - Wean levophed - Remove foley - PT ordered, needs to start mobilizing to improve strength - Tentatively scheduled for surgery on Monday 4/22 if he remains stable and works with PT. Plan for exploratory laparotomy, adhesiolysis and duodenojejunal bypass. - Continue TPN, remain NPO if extubated. - VTE: SCDs; lovenox on hold in setting of acute GI bleed - Dispo: inpatient, ICU    LOS: 10 days    Sophronia Simas, MD [facility] Surgery General, Hepatobiliary and Pancreatic Surgery 01/23/23 9:38 AM

## 2023-01-23 NOTE — Progress Notes (Signed)
PHARMACY - TOTAL PARENTERAL NUTRITION CONSULT NOTE   Indication: Intolerance to enteral feeding, Duodenal obstruction  Patient Measurements: Height:  (185.4 cm) Weight: 65.7 kg (144 lb 13.5 oz) IBW/kg (Calculated) : 79.9 TPN AdjBW (KG): 68.1 Body mass index is 19.11 kg/m.   Assessment: Pt is a 74 yoM admitted on 4/8 with N/V, chronic partial SBO, history of multiple previous abdominal surgeries. Pt has very minimal PO intake with ongoing weight loss. Pharmacy consulted to manage TPN.   Glucose / Insulin: No history of DM. Goal CBG: <180 - CBGs well controlled with only 2 units SSI given in past 9 days Electrolytes: WNL/stable except Mag low today, Phos trending up to borderline elevated; bicarb stable but borderline high Renal: SCr WNL/stable, BUN 29 Hepatic: LFTs, Tbili, Alk Phos, all WNL. Albumin 2.4 Intake / Output; MIVF: net I/O -6.5 L - UOP: >9000 mL/24, NG output ~3000 mL yesterday - LBM 4/15 -mIVF: NS 10 ml/hr (s/p massive transfusion protocol 4/15 AM, 1 unit PRBC 4/16) GI Imaging:  -4/4 UGI: Findings suggestive of chronic partial small bowel obstruction or ileus -4/9 CT Abd: Ileus vs high grade partial SBO GI Surgeries / Procedures:  -4/11 EGD: Gastric and small bowel dilation. Could have functional afferent loop syndrome or internal hernia 4/14 emergent bedside EGD:  oozing cratered gastric ulcer w/ oozing hemorrhage> inj w/ epi & clips placed - 4/15: IR for emergency embolization - 4/15: Anticipated ex lap will be rescheduled d/t shock  Central access: Double lumen PICC placed 4/8 TPN start date: 4/9  Nutritional Goals:  Goal TPN rate is 85 mL/hr (provides 112 g of protein and 2101 kcals per day)  RD Assessment: Estimated Needs Total Energy Estimated Needs: 2050-2250 Total Protein Estimated Needs: 100-115g Total Fluid Estimated Needs: 2.1L/day  Current Nutrition:  NPO  TPN  Plan:  Mg 4g IV x 1  At 1800: Continue TPN @ goal rate of 85 mL/hr   Electrolytes in TPN: Reduce Phos; increase Mag Na 150 mEq/L, K 30 mEq/L, Ca 10 mEq/L, Mg 53mEq/L, and Phos 15 mmol/L HY:[id number] 2:1 Add standard MVI and trace elements to TPN Completed 5 days thiamine 4/13 Continue Sensitive q8h SSI and adjust as needed >> consider stopping SSI with excellent CBG control mIVF per MD Monitor TPN labs on Mon/Thurs and PRN Mag, Phos, Bmet tomorrow   Bernadene Person, PharmD, BCPS [phone] 01/23/2023, 8:51 AM

## 2023-01-24 DIAGNOSIS — K2901 Acute gastritis with bleeding: Secondary | ICD-10-CM | POA: Diagnosis not present

## 2023-01-24 LAB — CBC WITH DIFFERENTIAL/PLATELET
Abs Immature Granulocytes: 0.05 10*3/uL (ref 0.00–0.07)
Basophils Absolute: 0 10*3/uL (ref 0.0–0.1)
Basophils Relative: 1 %
Eosinophils Absolute: 0.3 10*3/uL (ref 0.0–0.5)
Eosinophils Relative: 5 %
HCT: 34 % — ABNORMAL LOW (ref 39.0–52.0)
Hemoglobin: 10.8 g/dL — ABNORMAL LOW (ref 13.0–17.0)
Immature Granulocytes: 1 %
Lymphocytes Relative: 14 %
Lymphs Abs: 0.8 10*3/uL (ref 0.7–4.0)
MCH: 30.3 pg (ref 26.0–34.0)
MCHC: 31.8 g/dL (ref 30.0–36.0)
MCV: 95.2 fL (ref 80.0–100.0)
Monocytes Absolute: 0.5 10*3/uL (ref 0.1–1.0)
Monocytes Relative: 9 %
Neutro Abs: 4 10*3/uL (ref 1.7–7.7)
Neutrophils Relative %: 70 %
Platelets: 91 10*3/uL — ABNORMAL LOW (ref 150–400)
RBC: 3.57 MIL/uL — ABNORMAL LOW (ref 4.22–5.81)
RDW: 14.2 % (ref 11.5–15.5)
WBC: 5.7 10*3/uL (ref 4.0–10.5)
nRBC: 0 % (ref 0.0–0.2)

## 2023-01-24 LAB — GLUCOSE, CAPILLARY
Glucose-Capillary: 126 mg/dL — ABNORMAL HIGH (ref 70–99)
Glucose-Capillary: 81 mg/dL (ref 70–99)

## 2023-01-24 LAB — PHOSPHORUS: Phosphorus: 3.2 mg/dL (ref 2.5–4.6)

## 2023-01-24 LAB — BASIC METABOLIC PANEL
Anion gap: 6 (ref 5–15)
BUN: 27 mg/dL — ABNORMAL HIGH (ref 8–23)
CO2: 27 mmol/L (ref 22–32)
Calcium: 7.7 mg/dL — ABNORMAL LOW (ref 8.9–10.3)
Chloride: 103 mmol/L (ref 98–111)
Creatinine, Ser: 0.89 mg/dL (ref 0.61–1.24)
GFR, Estimated: >60 mL/min (ref >60)
Glucose, Bld: 117 mg/dL — ABNORMAL HIGH (ref 70–99)
Potassium: 4.3 mmol/L (ref 3.5–5.1)
Sodium: 136 mmol/L (ref 135–145)

## 2023-01-24 LAB — MAGNESIUM: Magnesium: 2.1 mg/dL (ref 1.7–2.4)

## 2023-01-24 MED ORDER — PHENOL 1.4 % MT LIQD
1.0000 | OROMUCOSAL | Status: DC | PRN
  Administered 2023-01-25: 1 via OROMUCOSAL
  Filled 2023-01-24: qty 177

## 2023-01-24 MED ORDER — LACTATED RINGERS IV BOLUS
1000.0000 mL | Freq: Once | INTRAVENOUS | Status: AC
  Administered 2023-01-24: 1000 mL via INTRAVENOUS

## 2023-01-24 MED ORDER — TRAVASOL 10 % IV SOLN
INTRAVENOUS | Status: AC
  Filled 2023-01-24: qty 1122

## 2023-01-24 NOTE — Progress Notes (Signed)
Patient ID: Bradley Dominguez, male   DOB: [birth year], 67 y.o.   MRN: [id number] Pt s/p mesenteric angio with coil embo of jejunal arcade branch (PSA) on 4/15 for GI bleeding; currently stable; afebrile, BP soft, off pressors, NG in place, family in room; WBC 5.7, hgb 10.8(10.7), plts 91k, creat nl; puncture site rt CFA soft, no hematoma, CVC out; scheduled for surgery per Dr. Freida Busman on 4/22

## 2023-01-24 NOTE — Progress Notes (Signed)
PHARMACY - TOTAL PARENTERAL NUTRITION CONSULT NOTE   Indication: Intolerance to enteral feeding, Duodenal obstruction  Patient Measurements: Height:  (185.4 cm) Weight: 64.9 kg (143 lb 1.3 oz) IBW/kg (Calculated) : 79.9 TPN AdjBW (KG): 68.1 Body mass index is 18.88 kg/m.   Assessment: Pt is a 70 yoM admitted on 4/8 with N/V, chronic partial SBO, history of multiple previous abdominal surgeries. Pt has very minimal PO intake with ongoing weight loss. Pharmacy consulted to manage TPN.   Glucose / Insulin: No history of DM. Goal CBG: <180 - CBGs well controlled with only 1 units SSI /24 hrs Electrolytes: WNL/stable  Renal: SCr WNL/stable, BUN 27 Hepatic: LFTs, Tbili, Alk Phos, all WNL. Albumin 2.4 Intake / Output; MIVF: net I/O 1120 mL - UOP: 2100 mL/24, NG output 1125 mL - LBM 4/15 -mIVF: none GI Imaging:  -4/4 UGI: Findings suggestive of chronic partial small bowel obstruction or ileus -4/9 CT Abd: Ileus vs high grade partial SBO GI Surgeries / Procedures:  -4/11 EGD: Gastric and small bowel dilation. Could have functional afferent loop syndrome or internal hernia 4/14 emergent bedside EGD:  oozing cratered gastric ulcer w/ oozing hemorrhage> inj w/ epi & clips placed - 4/15: IR for emergency embolization - 4/15: Anticipated ex lap will be rescheduled d/t shock  Central access: Double lumen PICC placed 4/8 TPN start date: 4/9  Nutritional Goals:  Goal TPN rate is 85 mL/hr (provides 112 g of protein and 2101 kcals per day)  RD Assessment: Estimated Needs Total Energy Estimated Needs: 2050-2250 Total Protein Estimated Needs: 100-115g Total Fluid Estimated Needs: 2.1L/day  Current Nutrition:  NPO  TPN  Plan:  At 1800: Continue TPN @ goal rate of 85 mL/hr  Electrolytes in TPN: Na 150 mEq/L, K 30 mEq/L, Ca 10 mEq/L, Mg 23mEq/L, and Phos 15 mmol/L UJ:[id number] 2:1 Add standard MVI and trace elements to TPN Completed 5 days thiamine 4/13 Continue Sensitive q8h CBG  checks.  D/c SSI with excellent CBG control and consider stopping CBG checks if maintaining stable glucose.   mIVF per MD Monitor TPN labs on Mon/Thurs and PRN   Lynann Beaver PharmD, BCPS [facility] main pharmacy [phone] 01/24/2023 8:34 AM

## 2023-01-24 NOTE — Progress Notes (Addendum)
NAME:  Bradley Dominguez, MRN:  [id number], DOB:  1956-01-01, LOS: 11 ADMISSION DATE:  01/13/2023, CONSULTATION DATE: 01/19/2023 REFERRING MD: Dr. Freida Busman, CHIEF COMPLAINT: GI bleed  History of Present Illness:  67 yo M HF, chronic partial small bowel obstruction and multiple previous abd surgeries  who was admitted to [facility] 4/8 from home w n/v/bloating. GI consult 4/10, underwent EGD 4/11 -- concerning for duodenal obstruction. Plan for 4/15 ex lap LOA duodenojejunal bypass.    On 4/14 started vomiting blood, dark red and clots noted. BP on lower end but baseline BP with systolics in 80's to low 90's so not far. Repeat hgb in 8's from 9. Denies pain or nausea, just fatigued. GI reconsulted and considering repeat endoscopy.  Developed hemorrhagic shock 4/15 in the setting of hematemesis / hematochezia, decompensated requiring MTP, intubation, TXA and embolization of jejunal arcade branch.    PCCM consulted.  Pertinent  Medical History   has a past medical history of Arthritis, Bowel obstruction, CHF (congestive heart failure) (11/26/2018), Elevated brain natriuretic peptide (BNP) level (11/25/2018), Family history of adverse reaction to anesthesia, Frequent UTI, History of colon polyps (2009), History of shingles, Plantar fasciitis, Prostate hypertrophy, and Vitamin B 12 deficiency.  Significant Hospital Events: Including procedures, antibiotic start and stop dates in addition to other pertinent events   4/8 Admit 4/11 EGD > duodenal obstruction 4/14 vomiting blood, hypotensive pccm consult 4/15 Significant hematemesis / hematochezia, decompensated > intubated. IR for embolization of jejunal arcade branch, MTP required, TXA.  Interim History / Subjective:  Pt reports feeling better overall NGT drainage is green/yellow, no obvious bleeding  Compress on LUE  Pt denies acute complaints   Objective   Blood pressure (!) 90/50, pulse 69, temperature 99.7 F (37.6 C), temperature source Oral, resp.  rate 15, height  (1.854 m), weight 64.9 kg, SpO2 99 %.        Intake/Output Summary (Last 24 hours) at 01/24/2023 0908 Last data filed at 01/24/2023 0500 Gross per 24 hour  Intake 1912.82 ml  Output 2900 ml  Net -987.18 ml   Filed Weights   01/22/23 0500 01/23/23 0500 01/24/23 0500  Weight: 73.8 kg 65.7 kg 64.9 kg    Examination:  General: chronically ill appearing adult male lying in bed in NAD HEENT: MM pink/dry, NGT in place, anicteric  Neuro: AAOx4, speech clear, MAE  CV: s1s2 RRR, no m/r/g PULM: non-labored at rest, lungs bilaterally clear with good air entry  GI: soft, bsx4 hypoactive   Extremities: warm/dry, no edema  Skin: no rashes or lesions  Resolved Hospital Problem list     Assessment & Plan:   Hemorrhagic Shock  Acute Blood Loss Anemia DIC Secondary to gastric ulcer s/p clip 4/14 and 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 Required MTP protocol, IR for embolization.  -appreciate IR, GI, [facility] -off vasopressors  -LR bolus 1L @ 276ml/hr  -follow H/H, significantly improved  -monitor for further bleeding  -NGT to LIS  Acute Respiratory Failure with Hypoxia Intubated 4/15 in setting of hemorrhagic shock. Pending return to OR for duodenal obstruction at some point.  -continue pulmonary hygiene -IS, mobilize  -NPO  -O2 as needed to support sats >90%  Duodenal Obstruction Nonfunctioning prior GJ bypass. Initial plan was for surgery 4/15, but developed hemorrhagic shock. EGD path negative.  -[facility] plan for surgery on Monday -NPO / bowel rest  -TPN  -NGT to LIS   Protein Calorie Malnutrition -TPN per Pharmacy    Central  Access -PICC line care per NSG  Left Arm Swelling, DVT RULED OUT  Superficial Vein Thrombosis Positive Left Basilic Vein, L Cephalic Vein  -warm compress, elevation of arm   Best Practice (right click and "Reselect all SmartList Selections" daily)  Diet/type: TPN DVT prophylaxis: SCD GI prophylaxis:  PPI BID Lines: Central line and PICC Foley:  N/A Code Status:  full code Last date of multidisciplinary goals of care discussion:  4/16  Wife and patient updated at bedside on plan of care.   PCCM will ask [facility] to consult for medical management. We will follow up on Monday post-operatively to ensure no new ICU needs.   Critical Care Time: n/a    Canary Brim, MSN, APRN, NP-C, AGACNP-BC [facility] 01/24/2023, 9:08 AM   Please see [email or web address] for pager details.   From 7A-7P if no response, please call [phone] After hours, please call ELink [phone]

## 2023-01-24 NOTE — Progress Notes (Signed)
5 Days Post-Op  Subjective: Off pressors, stable. Hgb stable at 10. No bowel movements yet. Worked with PT yesterday, sat on side of bed.   Objective: Vital signs in last 24 hours: Temp:  [97.6 F (36.4 C)-99.1 F (37.3 C)] 97.9 F (36.6 C) (04/19 0400) Pulse Rate:  [66-88] 77 (04/19 0500) Resp:  [14-26] 16 (04/19 0500) SpO2:  [95 %-100 %] 97 % (04/19 0500) Arterial Line BP: (77-125)/(46-74) 102/50 (04/19 0500) Weight:  [64.9 kg] 64.9 kg (04/19 0500) Last BM Date : 01/20/23  Intake/Output from previous day: 04/18 0701 - 04/19 0700 In: 2009 [I.V.:1909.1; IV Piggyback:100] Out: 3225 [Urine:2100; Emesis/NG output:1125] Intake/Output this shift: No intake/output data recorded.  PE: General: alert and oriented Neuro: no focal deficits HEENT: NG tube with bilious, non-bloody drainage. Resp: normal work of breathing Abdomen: soft, nondistended, nontender to palpation Extremities: warm and well-perfused, swelling LUE    Lab Results:  Recent Labs    01/23/23 0924 01/24/23 0506  WBC 6.4 5.7  HGB 10.7* 10.8*  HCT 33.2* 34.0*  PLT 82* 91*    BMET Recent Labs    01/23/23 0632 01/24/23 0506  NA 137 136  K 4.2 4.3  CL 101 103  CO2 30 27  GLUCOSE 131* 117*  BUN 29* 27*  CREATININE 0.96 0.89  CALCIUM 7.9* 7.7*   PT/INR Recent Labs    01/23/23 0330  LABPROT 14.2  INR 1.1   CMP     Component Value Date/Time   NA 136 01/24/2023 0506   K 4.3 01/24/2023 0506   CL 103 01/24/2023 0506   CO2 27 01/24/2023 0506   GLUCOSE 117 (H) 01/24/2023 0506   BUN 27 (H) 01/24/2023 0506   CREATININE 0.89 01/24/2023 0506   CALCIUM 7.7 (L) 01/24/2023 0506   PROT 4.8 (L) 01/23/2023 0632   ALBUMIN 2.4 (L) 01/23/2023 0632   AST 20 01/23/2023 0632   ALT 21 01/23/2023 0632   ALKPHOS 41 01/23/2023 0632   BILITOT 0.8 01/23/2023 0632   GFRNONAA >60 01/24/2023 0506   GFRAA >60 09/23/2017 0507   Lipase  No results found for: "LIPASE"     Studies/Results: [place] EKG Site  Rite  Result Date: 01/23/2023 If Site Rite image not attached, placement could not be confirmed due to current cardiac rhythm.  VAS [place] UPPER EXTREMITY VENOUS DUPLEX  Result Date: 01/22/2023 UPPER VENOUS STUDY  Patient Name:  Bradley Dominguez  Date of Exam:   01/22/2023 Medical Rec #: [id number]       Accession #:    [id number] Date of Birth: 04-03-1956       Patient Gender: M Patient Age:   83 years Exam Location:  [facility] Procedure:      VAS [place] UPPER EXTREMITY VENOUS DUPLEX Referring Phys: Renae Fickle HOFFMAN --------------------------------------------------------------------------------  Indications: Pain, Swelling, and redness, recent IV sticks Limitations: Bandages and Multiple IV placements. Comparison Study: No previous study. Performing Technologist: McKayla Maag RVT, [place]  Examination Guidelines: A complete evaluation includes B-mode imaging, spectral Doppler, color Doppler, and power Doppler as needed of all accessible portions of each vessel. Bilateral testing is considered an integral part of a complete examination. Limited examinations for reoccurring indications may be performed as noted.  Right Findings: +----------+------------+---------+-----------+----------+-------+ RIGHT     CompressiblePhasicitySpontaneousPropertiesSummary +----------+------------+---------+-----------+----------+-------+ Subclavian    Full       Yes       Yes                      +----------+------------+---------+-----------+----------+-------+  Left Findings: +----------+------------+---------+-----------+----------+-------+ LEFT      CompressiblePhasicitySpontaneousPropertiesSummary +----------+------------+---------+-----------+----------+-------+ IJV           Full       Yes       Yes                      +----------+------------+---------+-----------+----------+-------+ Subclavian    Full       Yes       Yes                       +----------+------------+---------+-----------+----------+-------+ Axillary      Full       Yes       Yes                      +----------+------------+---------+-----------+----------+-------+ Brachial      Full       Yes       Yes                      +----------+------------+---------+-----------+----------+-------+ Radial        Full                                          +----------+------------+---------+-----------+----------+-------+ Ulnar         Full                                          +----------+------------+---------+-----------+----------+-------+ Cephalic      None       No        No                Acute  +----------+------------+---------+-----------+----------+-------+ Basilic       None       No        No                Acute  +----------+------------+---------+-----------+----------+-------+  Summary:  Right: No evidence of thrombosis in the subclavian.  Left: No evidence of deep vein thrombosis in the upper extremity. Findings consistent with acute superficial vein thrombosis involving the left basilic vein (Proximal upper arm to [facility]) and left cephalic vein (Distal upper arm to proximal forearm).  *See table(s) above for measurements and observations.  Diagnosing physician: Gerarda Fraction Electronically signed by Gerarda Fraction on 01/22/2023 at 10:28:01 PM.    Final     Anti-infectives: Anti-infectives (From admission, onward)    Start     Dose/Rate Route Frequency Ordered Stop   01/19/23 0600  cefTRIAXone (ROCEPHIN) 2 g in sodium chloride 0.9 % 100 mL IVPB       See Hyperspace for full Linked Orders Report.   2 g 200 mL/hr over 30 Minutes Intravenous On call to O.R. 01/19/23 0350 01/19/23 0606   01/19/23 0350  metroNIDAZOLE (FLAGYL) IVPB 500 mg  Status:  Discontinued       See Hyperspace for full Linked Orders Report.   500 mg 100 mL/hr over 60 Minutes Intravenous Every 8 hours 01/19/23 0350 01/19/23 0752         Assessment/Plan 67 yo male with chronic intermittent bloating, presenting with worsening symptoms, PO intolerance and dehydration. Developed massive GI bleeding secondary to small GJ ulceration with a pseudoaneurysm of  the jejunal arcade. - IR embolization performed 4/15. Received massive transfusion. Now off pressors. Hgb stable at 10 for several days, no signs of ongoing bleeding. - Remove a-line - PT following, continue mobilizing as much as possible to improve strength - Superficial venous thrombosis LUE: warm compresses - Duodenal obstruction: NG tube to LIS, NPO except sips/chips, continue TPN - Scheduled for surgery Monday 4/22 - VTE: SCDs; lovenox on hold in setting of acute GI bleed - Dispo: inpatient, step-down status    LOS: 11 days    Sophronia Simas, MD [facility] General, Hepatobiliary and Pancreatic Surgery 01/24/23 7:27 AM

## 2023-01-24 NOTE — Progress Notes (Signed)
Notified Dr. Freida Busman of 83/58 Map of 62. This is his baseline prior to admission, and as long as long as he is asymptomatic. It ok

## 2023-01-25 DIAGNOSIS — K565 Intestinal adhesions [bands], unspecified as to partial versus complete obstruction: Secondary | ICD-10-CM | POA: Diagnosis not present

## 2023-01-25 LAB — MAGNESIUM: Magnesium: 1.6 mg/dL — ABNORMAL LOW (ref 1.7–2.4)

## 2023-01-25 LAB — BASIC METABOLIC PANEL
Anion gap: 7 (ref 5–15)
BUN: 34 mg/dL — ABNORMAL HIGH (ref 8–23)
CO2: 26 mmol/L (ref 22–32)
Calcium: 8 mg/dL — ABNORMAL LOW (ref 8.9–10.3)
Chloride: 102 mmol/L (ref 98–111)
Creatinine, Ser: 0.82 mg/dL (ref 0.61–1.24)
GFR, Estimated: >60 mL/min (ref >60)
Glucose, Bld: 98 mg/dL (ref 70–99)
Potassium: 4.5 mmol/L (ref 3.5–5.1)
Sodium: 135 mmol/L (ref 135–145)

## 2023-01-25 LAB — CBC
HCT: 35 % — ABNORMAL LOW (ref 39.0–52.0)
Hemoglobin: 11.1 g/dL — ABNORMAL LOW (ref 13.0–17.0)
MCH: 30.6 pg (ref 26.0–34.0)
MCHC: 31.7 g/dL (ref 30.0–36.0)
MCV: 96.4 fL (ref 80.0–100.0)
Platelets: 102 10*3/uL — ABNORMAL LOW (ref 150–400)
RBC: 3.63 MIL/uL — ABNORMAL LOW (ref 4.22–5.81)
RDW: 14.2 % (ref 11.5–15.5)
WBC: 5.6 10*3/uL (ref 4.0–10.5)
nRBC: 0 % (ref 0.0–0.2)

## 2023-01-25 LAB — GLUCOSE, CAPILLARY
Glucose-Capillary: 100 mg/dL — ABNORMAL HIGH (ref 70–99)
Glucose-Capillary: 96 mg/dL (ref 70–99)
Glucose-Capillary: 98 mg/dL (ref 70–99)

## 2023-01-25 LAB — HEMOGLOBIN AND HEMATOCRIT, BLOOD
HCT: 35.6 % — ABNORMAL LOW (ref 39.0–52.0)
Hemoglobin: 11.4 g/dL — ABNORMAL LOW (ref 13.0–17.0)

## 2023-01-25 MED ORDER — TRAVASOL 10 % IV SOLN
INTRAVENOUS | Status: AC
  Filled 2023-01-25: qty 1122

## 2023-01-25 MED ORDER — MAGNESIUM SULFATE 4 GM/100ML IV SOLN
4.0000 g | Freq: Once | INTRAVENOUS | Status: AC
  Administered 2023-01-25: 4 g via INTRAVENOUS
  Filled 2023-01-25: qty 100

## 2023-01-25 NOTE — Progress Notes (Signed)
PHARMACY - TOTAL PARENTERAL NUTRITION CONSULT NOTE   Indication: Intolerance to enteral feeding, Duodenal obstruction  Patient Measurements: Height:  (185.4 cm) Weight: 64.8 kg (142 lb 13.7 oz) IBW/kg (Calculated) : 79.9 TPN AdjBW (KG): 68.1 Body mass index is 18.85 kg/m.   Assessment: Pt is a 76 yoM admitted on 4/8 with N/V, chronic partial SBO, history of multiple previous abdominal surgeries. Pt has very minimal PO intake with ongoing weight loss. Pharmacy consulted to manage TPN.   Glucose / Insulin: No history of DM. Goal CBG: <180 - CBGs well controlled with only 1 units SSI /24 hrs Electrolytes: WNL/stable except Mag 1.6, CorrCa 9.28 Renal: SCr WNL/stable, BUN 34 Hepatic: LFTs, Tbili, Alk Phos, all WNL. Albumin 2.4 Intake / Output; MIVF: net I/O +838mL - UOP: 1800 mL/24, NG output down to 50 mL - LBM 4/15 -mIVF: none GI Imaging:  -4/4 UGI: Findings suggestive of chronic partial small bowel obstruction or ileus -4/9 CT Abd: Ileus vs high grade partial SBO GI Surgeries / Procedures:  -4/11 EGD: Gastric and small bowel dilation. Could have functional afferent loop syndrome or internal hernia - 4/14 emergent bedside EGD:  oozing cratered gastric ulcer w/ oozing hemorrhage> inj w/ epi & clips placed - 4/15: IR for emergency embolization  Central access: Double lumen PICC placed 4/8 TPN start date: 4/9  Nutritional Goals:  Goal TPN rate is 85 mL/hr (provides 112 g of protein and 2101 kcals per day)  RD Assessment: Estimated Needs Total Energy Estimated Needs: 2050-2250 Total Protein Estimated Needs: 100-115g Total Fluid Estimated Needs: 2.1L/day  Current Nutrition:  NPO  TPN  Plan:  Now: Mag 4g IV already ordered At 1800: Continue TPN @ goal rate of 85 mL/hr  Electrolytes in TPN: Na 150 mEq/L, K 30 mEq/L, Ca 10 mEq/L, Mg 7 mEq/L, and Phos 15 mmol/L [id number] 2:1 Add standard MVI and trace elements to TPN Completed 5 days thiamine 4/13 D/c CBG/SSI with  excellent CBG control mIVF per MD Monitor TPN labs on Mon/Thurs and PRN   Lynann Beaver PharmD, BCPS [facility] main pharmacy [phone] 01/25/2023 9:53 AM

## 2023-01-25 NOTE — Progress Notes (Signed)
6 Days Post-Op   Subjective/Chief Complaint: Awake and alert.  No complaints.   Objective: Vital signs in last 24 hours: Temp:  [97.6 F (36.4 C)-98 F (36.7 C)] 97.6 F (36.4 C) (04/20 0828) Pulse Rate:  [64-77] 69 (04/20 0800) Resp:  [12-19] 13 (04/20 0800) BP: (71-105)/(43-92) 105/92 (04/20 0800) SpO2:  [93 %-100 %] 97 % (04/20 0800) Weight:  [64.8 kg] 64.8 kg (04/20 0500) Last BM Date : 01/20/23  Intake/Output from previous day: 04/19 0701 - 04/20 0700 In: 2692.2 [P.O.:50; I.V.:1950.2; NG/GT:50; IV Piggyback:642.1] Out: 1850 [Urine:1800; Emesis/NG output:50] Intake/Output this shift: Total I/O In: 595 [I.V.:496.2; IV Piggyback:98.9] Out: -   General appearance: alert and cooperative Resp: clear to auscultation bilaterally Cardio: regular rate and rhythm GI: Soft and nontender  Lab Results:  Recent Labs    01/24/23 0506 01/25/23 0317  WBC 5.7 5.6  HGB 10.8* 11.1*  HCT 34.0* 35.0*  PLT 91* 102*   BMET Recent Labs    01/24/23 0506 01/25/23 0317  NA 136 135  K 4.3 4.5  CL 103 102  CO2 27 26  GLUCOSE 117* 98  BUN 27* 34*  CREATININE 0.89 0.82  CALCIUM 7.7* 8.0*   PT/INR Recent Labs    01/23/23 0330  LABPROT 14.2  INR 1.1   ABG No results for input(s): "PHART", "HCO3" in the last 72 hours.  Invalid input(s): "PCO2", "PO2"  Studies/Results: [place] EKG Site Rite  Result Date: 01/23/2023 If Site Rite image not attached, placement could not be confirmed due to current cardiac rhythm.   Anti-infectives: Anti-infectives (From admission, onward)    Start     Dose/Rate Route Frequency Ordered Stop   01/19/23 0600  cefTRIAXone (ROCEPHIN) 2 g in sodium chloride 0.9 % 100 mL IVPB       See Hyperspace for full Linked Orders Report.   2 g 200 mL/hr over 30 Minutes Intravenous On call to O.R. 01/19/23 0350 01/19/23 0606   01/19/23 0350  metroNIDAZOLE (FLAGYL) IVPB 500 mg  Status:  Discontinued       See Hyperspace for full Linked Orders Report.   500  mg 100 mL/hr over 60 Minutes Intravenous Every 8 hours 01/19/23 0350 01/19/23 0752       Assessment/Plan: s/p Procedure(s): ESOPHAGOGASTRODUODENOSCOPY (EGD) (N/A) HEMOSTASIS CONTROL HEMOSTASIS CLIP PLACEMENT SUBMUCOSAL INJECTION Continue NG and bowel rest 67 yo male with chronic intermittent bloating, presenting with worsening symptoms, PO intolerance and dehydration. 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 - IR embolization performed 4/15. Received massive transfusion. Now off pressors. Hgb stable at 10 for several days, no signs of ongoing bleeding. - Remove a-line - PT following, continue mobilizing as much as possible to improve strength - Superficial venous thrombosis LUE: warm compresses - Duodenal obstruction: NG tube to LIS, NPO except sips/chips, continue TPN - Scheduled for surgery Monday 4/22 - VTE: SCDs; lovenox on hold in setting of acute GI bleed - Dispo: inpatient, step-down status  LOS: 12 days    Bradley Dominguez III 01/25/2023

## 2023-01-25 NOTE — Progress Notes (Signed)
PROGRESS NOTE    Bradley Dominguez  ZOX:[id number] DOB: January 22, 1956 DOA: 01/13/2023 PCP: Gaspar Garbe, MD     Brief Narrative:  Bradley Dominguez is a 67 yo M with past medical history significant for chronic partial small bowel obstruction and multiple previous abd surgeries  who was admitted to [facility] 4/8 from home w n/v/bloating. GI consult 4/10, underwent EGD 4/11 -- concerning for duodenal obstruction. Planned for 4/15 ex lap LOA duodenojejunal bypass.    On 4/14 started vomiting blood, dark red and clots noted. BP on lower end but baseline BP with systolics in 80's to low 90's so not far. Repeat hgb in 8's from 9. Developed hemorrhagic shock 4/15 in the setting of hematemesis / hematochezia, decompensated requiring MTP, intubation, TXA and embolization of jejunal arcade branch. PCCM consulted for ICU care.  4/8: Admit to general surgery team 4/11: EGD > duodenal obstruction 4/14: Hematemesis, hypotensive.  PCCM consulted 4/15: Significant hematemesis, hematochezia, hemorrhagic shock.  Intubated.  Status post IR for embolization of jejunal arcade branch, MTP required, TXA 4/17: Extubated 4/20: Transferred from [facility] to [facility] service.  General surgery remains primary service.   New events last 24 hours / Subjective: Patient without any complaints today.  Pain well-controlled.  Awaiting ex lap scheduled for Monday.  Assessment & Plan:  Principal Problem:   Bowel obstruction Active Problems:   Protein-calorie malnutrition, severe   Gastrointestinal hemorrhage associated with acute gastritis   Duodenal obstruction   Hemorrhagic shock   Acute respiratory failure with hypoxia   Hemorrhagic shock, acute blood loss anemia, DIC -Secondary to gastric ulcer s/p clip 4/14 and 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 Required MTP protocol, IR for embolization -Now off vasopressors -Shock resolved.  Hemoglobin stable this morning  Acute respiratory failure with  hypoxia -Extubated.  Stable  Duodenal obstruction -Planned for ex lap 4/22 -Per primary team -TPN, NG tube, NPO  Hypomagnesia -Replace    In agreement with assessment of the pressure ulcer as below:  Pressure Injury 01/24/23 Vertebral column Upper Stage 1 -  Intact skin with non-blanchable redness of a localized area usually over a bony prominence. 4 cm x 2 cm (Active)  01/24/23 1000  Location: Vertebral column  Location Orientation: Upper  Staging: Stage 1 -  Intact skin with non-blanchable redness of a localized area usually over a bony prominence.  Wound Description (Comments): 4 cm x 2 cm  Present on Admission: No  Dressing Type Foam - Lift dressing to assess site every shift 01/25/23 0800     Nutrition Problem: Severe Malnutrition Etiology: chronic illness   Antimicrobials:  Anti-infectives (From admission, onward)    Start     Dose/Rate Route Frequency Ordered Stop   01/19/23 0600  cefTRIAXone (ROCEPHIN) 2 g in sodium chloride 0.9 % 100 mL IVPB       See Hyperspace for full Linked Orders Report.   2 g 200 mL/hr over 30 Minutes Intravenous On call to O.R. 01/19/23 0350 01/19/23 0606   01/19/23 0350  metroNIDAZOLE (FLAGYL) IVPB 500 mg  Status:  Discontinued       See Hyperspace for full Linked Orders Report.   500 mg 100 mL/hr over 60 Minutes Intravenous Every 8 hours 01/19/23 0350 01/19/23 0752        Objective: Vitals:   01/25/23 0600 01/25/23 0700 01/25/23 0800 01/25/23 0828  BP: (!) 101/55 (!) 105/54 (!) 105/92   Pulse: 68 68 69   Resp: Temp:  97.6 F (36.4 C)  TempSrc:    Oral  SpO2: 100% 98% 97%   Weight:      Height:        Intake/Output Summary (Last 24 hours) at 01/25/2023 1035 Last data filed at 01/25/2023 0940 Gross per 24 hour  Intake 2710.26 ml  Output 1850 ml  Net 860.26 ml   Filed Weights   01/23/23 0500 01/24/23 0500 01/25/23 0500  Weight: 65.7 kg 64.9 kg 64.8 kg    Examination:  General exam: Appears calm and  comfortable  Respiratory system: Clear to auscultation. Respiratory effort normal. Cardiovascular system: S1 & S2 heard, RRR. No murmurs. No pedal edema. Gastrointestinal system: Abdomen is nondistended, NG tube in place Central nervous system: Alert and oriented. No focal neurological deficits. Speech clear.  Extremities: Symmetric in appearance  Skin: No rashes, lesions or ulcers on exposed skin  Psychiatry: Judgement and insight appear normal. Mood & affect appropriate.   Data Reviewed: I have personally reviewed following labs and imaging studies  CBC: Recent Labs  Lab 01/22/23 0515 01/22/23 0555 01/22/23 2111 01/23/23 0330 01/23/23 0924 01/24/23 0506 01/25/23 0317  WBC 6.1  --  6.0 5.7 6.4 5.7 5.6  NEUTROABS 4.3  --   --   --   --  4.0  --   HGB 10.1*   < > 10.3* 10.2* 10.7* 10.8* 11.1*  HCT 31.3*   < > 33.2* 34.3* 33.2* 34.0* 35.0*  MCV 93.4  --  104.1* 113.2* 94.9 95.2 96.4  PLT 74*  --  75* 85* 82* 91* 102*   < > = values in this interval not displayed.   Basic Metabolic Panel: Recent Labs  Lab 01/19/23 0959 01/20/23 0146 01/20/23 1010 01/20/23 1622 01/21/23 0630 01/22/23 0515 01/23/23 1610 01/24/23 0506 01/25/23 0317  NA  --  123*   < > 134* 130* 138 137 136 135  K  --  4.4   < > 4.1 4.6 4.6 4.2 4.3 4.5  CL  --  91*   < > 98 94* 103 101 103 102  CO2  --  26   < > GLUCOSE  --  713*   < > 121* 129* 109* 131* 117* 98  BUN  --  43*   < > 45* 42* 35* 29* 27* 34*  CREATININE  --  0.99   < > 0.84 0.80 0.85 0.96 0.89 0.82  CALCIUM  --  5.6*   < > 6.8* 7.0* 7.7* 7.9* 7.7* 8.0*  MG  --  1.0*   < > 2.5* 1.8  --  1.4* 2.1 1.6*  PHOS 2.5 3.3  --  3.3  --   --  4.5 3.2  --    < > = values in this interval not displayed.   GFR: Estimated Creatinine Clearance: 81.2 mL/min (by C-G formula based on SCr of 0.82 mg/dL). Liver Function Tests: Recent Labs  Lab 01/20/23 0146 01/20/23 1622 01/22/23 0515 01/23/23 0632  AST ALT ALKPHOS 18* 32* 36* 41  BILITOT 0.3 1.0 0.6 0.8  PROT <3.0* 3.9* 4.3* 4.8*  ALBUMIN 1.6* 2.2* 2.3* 2.4*   No results for input(s): "LIPASE", "AMYLASE" in the last 168 hours. No results for input(s): "AMMONIA" in the last 168 hours. Coagulation Profile: Recent Labs  Lab 01/20/23 0150 01/20/23 0616 01/20/23 1130 01/20/23 1623 01/23/23 0330  INR 2.4* 1.5* 1.4* 1.2 1.1  Cardiac Enzymes: No results for input(s): "CKTOTAL", "CKMB", "CKMBINDEX", "TROPONINI" in the last 168 hours. BNP (last 3 results) No results for input(s): "PROBNP" in the last 8760 hours. HbA1C: No results for input(s): "HGBA1C" in the last 72 hours. CBG: Recent Labs  Lab 01/23/23 2316 01/24/23 0806 01/24/23 1557 01/25/23 0011 01/25/23 0802  GLUCAP 109* 126* 81 100* 98   Lipid Profile: Recent Labs    01/23/23 0632  TRIG 29   Thyroid Function Tests: No results for input(s): "TSH", "T4TOTAL", "FREET4", "T3FREE", "THYROIDAB" in the last 72 hours. Anemia Panel: No results for input(s): "VITAMINB12", "FOLATE", "FERRITIN", "TIBC", "IRON", "RETICCTPCT" in the last 72 hours. Sepsis Labs: No results for input(s): "PROCALCITON", "LATICACIDVEN" in the last 168 hours.  No results found for this or any previous visit (from the past 240 hour(s)).    Radiology Studies: [place] EKG Site Rite  Result Date: 01/23/2023 If Site Rite image not attached, placement could not be confirmed due to current cardiac rhythm.     Scheduled Meds:  Chlorhexidine Gluconate Cloth  6 each Topical Daily   insulin aspart  0-9 Units Subcutaneous Q8H   pantoprazole  40 mg Intravenous Q12H   sodium chloride flush  10-40 mL Intracatheter Q12H   Continuous Infusions:  sodium chloride     TPN ADULT (ION) 85 mL/hr at 01/25/23 0940   TPN ADULT (ION)       LOS: 12 days   Time spent: 30 minutes   Noralee Stain, DO [facility] 01/25/2023, 10:35 AM   Available via Epic secure chat 7am-7pm After these hours, please refer to  coverage provider listed on [email or web address]

## 2023-01-25 NOTE — Progress Notes (Signed)
[facility] ADULT ICU REPLACEMENT PROTOCOL   The patient does apply for the [facility] Adult ICU Electrolyte Replacment Protocol based on the criteria listed below:   1.Exclusion criteria: TCTS, ECMO, Dialysis, and Myasthenia Gravis patients 2. Is GFR >/= 30 ml/min? Yes.    Patient's GFR today is >60 3. Is SCr </= 2? Yes.   Patient's SCr is 0.82 mg/dL 4. Did SCr increase >/= 0.5 in 24 hours? No. 5.Pt's weight >40kg  Yes.   6. Abnormal electrolyte(s): mag 1.6  7. Electrolytes replaced per protocol 8.  Call MD STAT for K+ </= 2.5, Phos </= 1, or Mag </= 1 Physician:  protocol  Melvern Banker 01/25/2023 5:34 AM

## 2023-01-26 DIAGNOSIS — K565 Intestinal adhesions [bands], unspecified as to partial versus complete obstruction: Secondary | ICD-10-CM | POA: Diagnosis not present

## 2023-01-26 LAB — BASIC METABOLIC PANEL
Anion gap: 5 (ref 5–15)
BUN: 34 mg/dL — ABNORMAL HIGH (ref 8–23)
CO2: 25 mmol/L (ref 22–32)
Calcium: 7.8 mg/dL — ABNORMAL LOW (ref 8.9–10.3)
Chloride: 104 mmol/L (ref 98–111)
Creatinine, Ser: 0.8 mg/dL (ref 0.61–1.24)
GFR, Estimated: >60 mL/min (ref >60)
Glucose, Bld: 94 mg/dL (ref 70–99)
Potassium: 4.3 mmol/L (ref 3.5–5.1)
Sodium: 134 mmol/L — ABNORMAL LOW (ref 135–145)

## 2023-01-26 LAB — GLUCOSE, CAPILLARY
Glucose-Capillary: 112 mg/dL — ABNORMAL HIGH (ref 70–99)
Glucose-Capillary: 90 mg/dL (ref 70–99)
Glucose-Capillary: 97 mg/dL (ref 70–99)

## 2023-01-26 LAB — MAGNESIUM: Magnesium: 1.7 mg/dL (ref 1.7–2.4)

## 2023-01-26 LAB — CBC
HCT: 33.5 % — ABNORMAL LOW (ref 39.0–52.0)
Hemoglobin: 10.6 g/dL — ABNORMAL LOW (ref 13.0–17.0)
MCH: 30.6 pg (ref 26.0–34.0)
MCHC: 31.6 g/dL (ref 30.0–36.0)
MCV: 96.8 fL (ref 80.0–100.0)
Platelets: 114 10*3/uL — ABNORMAL LOW (ref 150–400)
RBC: 3.46 MIL/uL — ABNORMAL LOW (ref 4.22–5.81)
RDW: 14.2 % (ref 11.5–15.5)
WBC: 4.7 10*3/uL (ref 4.0–10.5)
nRBC: 0 % (ref 0.0–0.2)

## 2023-01-26 LAB — PHOSPHORUS: Phosphorus: 3 mg/dL (ref 2.5–4.6)

## 2023-01-26 MED ORDER — TRAVASOL 10 % IV SOLN
INTRAVENOUS | Status: AC
  Filled 2023-01-26: qty 1122

## 2023-01-26 NOTE — Progress Notes (Signed)
PROGRESS NOTE    Bradley Dominguez  ZOX:[id number] DOB: Mar 17, 1956 DOA: 01/13/2023 PCP: Gaspar Garbe, MD     Brief Narrative:  Bradley Dominguez is a 67 yo M with past medical history significant for chronic partial small bowel obstruction and multiple previous abd surgeries  who was admitted to [facility] 4/8 from home w n/v/bloating. GI consult 4/10, underwent EGD 4/11 -- concerning for duodenal obstruction. Planned for 4/15 ex lap LOA duodenojejunal bypass.    On 4/14 started vomiting blood, dark red and clots noted. BP on lower end but baseline BP with systolics in 80's to low 90's so not far. Repeat hgb in 8's from 9. Developed hemorrhagic shock 4/15 in the setting of hematemesis / hematochezia, decompensated requiring MTP, intubation, TXA and embolization of jejunal arcade branch. PCCM consulted for ICU care.  4/8: Admit to general surgery team 4/11: EGD > duodenal obstruction 4/14: Hematemesis, hypotensive.  PCCM consulted 4/15: Significant hematemesis, hematochezia, hemorrhagic shock.  Intubated.  Status post IR for embolization of jejunal arcade branch, MTP required, TXA 4/17: Extubated 4/20: Transferred from [facility] to [facility] service.  General surgery remains primary service.   New events last 24 hours / Subjective: Wife is at bedside.  No new complaints today.   Assessment & Plan:  Principal Problem:   Bowel obstruction Active Problems:   Protein-calorie malnutrition, severe   Gastrointestinal hemorrhage associated with acute gastritis   Duodenal obstruction   Hemorrhagic shock   Acute respiratory failure with hypoxia   Hemorrhagic shock, acute blood loss anemia, DIC -Secondary to gastric ulcer s/p clip 4/14 and 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 Required MTP protocol, IR for embolization -Now off vasopressors -Shock resolved.  Hemoglobin stable this morning  Acute respiratory failure with hypoxia -Extubated.  Stable  Duodenal  obstruction -Planned for ex lap 4/22 -Per primary team -TPN, NG tube, NPO     In agreement with assessment of the pressure ulcer as below:  Pressure Injury 01/24/23 Vertebral column Upper Stage 1 -  Intact skin with non-blanchable redness of a localized area usually over a bony prominence. 4 cm x 2 cm (Active)  01/24/23 1000  Location: Vertebral column  Location Orientation: Upper  Staging: Stage 1 -  Intact skin with non-blanchable redness of a localized area usually over a bony prominence.  Wound Description (Comments): 4 cm x 2 cm  Present on Admission: No  Dressing Type Foam - Lift dressing to assess site every shift 01/25/23 2000     Nutrition Problem: Severe Malnutrition Etiology: chronic illness   Antimicrobials:  Anti-infectives (From admission, onward)    Start     Dose/Rate Route Frequency Ordered Stop   01/19/23 0600  cefTRIAXone (ROCEPHIN) 2 g in sodium chloride 0.9 % 100 mL IVPB       See Hyperspace for full Linked Orders Report.   2 g 200 mL/hr over 30 Minutes Intravenous On call to O.R. 01/19/23 0350 01/19/23 0606   01/19/23 0350  metroNIDAZOLE (FLAGYL) IVPB 500 mg  Status:  Discontinued       See Hyperspace for full Linked Orders Report.   500 mg 100 mL/hr over 60 Minutes Intravenous Every 8 hours 01/19/23 0350 01/19/23 0752        Objective: Vitals:   01/26/23 0400 01/26/23 0500 01/26/23 0828 01/26/23 0956  BP: (!) 90/47     Pulse: 75   78  Resp: 16   17  Temp: 97.7 F (36.5 C)  97.9 F (36.6 C)  TempSrc: Oral  Tympanic   SpO2: 95%   100%  Weight:  65.3 kg    Height:        Intake/Output Summary (Last 24 hours) at 01/26/2023 1327 Last data filed at 01/26/2023 0850 Gross per 24 hour  Intake 1314.63 ml  Output 1250 ml  Net 64.63 ml    Filed Weights   01/24/23 0500 01/25/23 0500 01/26/23 0500  Weight: 64.9 kg 64.8 kg 65.3 kg    Examination:  General exam: Appears calm and comfortable  Respiratory system: Clear to auscultation.  Respiratory effort normal. Cardiovascular system: S1 & S2 heard, RRR. No murmurs. No pedal edema. Gastrointestinal system: Abdomen is nondistended, NG tube in place Central nervous system: Alert and oriented. No focal neurological deficits. Speech clear.  Extremities: Symmetric in appearance  Skin: No rashes, lesions or ulcers on exposed skin  Psychiatry: Judgement and insight appear normal. Mood & affect appropriate.   Data Reviewed: I have personally reviewed following labs and imaging studies  CBC: Recent Labs  Lab 01/22/23 0515 01/22/23 0555 01/23/23 0330 01/23/23 0924 01/24/23 0506 01/25/23 0317 01/25/23 2107 01/26/23 0509  WBC 6.1   < > 5.7 6.4 5.7 5.6  --  4.7  NEUTROABS 4.3  --   --   --  4.0  --   --   --   HGB 10.1*   < > 10.2* 10.7* 10.8* 11.1* 11.4* 10.6*  HCT 31.3*   < > 34.3* 33.2* 34.0* 35.0* 35.6* 33.5*  MCV 93.4   < > 113.2* 94.9 95.2 96.4  --  96.8  PLT 74*   < > 85* 82* 91* 102*  --  114*   < > = values in this interval not displayed.    Basic Metabolic Panel: Recent Labs  Lab 01/20/23 0146 01/20/23 1010 01/20/23 1622 01/21/23 0630 01/22/23 0515 01/23/23 1610 01/24/23 0506 01/25/23 0317 01/26/23 0503  NA 123*   < > 134* 130* 138 137 136 135 134*  K 4.4   < > 4.1 4.6 4.6 4.2 4.3 4.5 4.3  CL 91*   < > 98 94* 103 101 103 102 104  CO2 26   < > 31 31 30 30 27 26 25   GLUCOSE 713*   < > 121* 129* 109* 131* 117* 98 94  BUN 43*   < > 45* 42* 35* 29* 27* 34* 34*  CREATININE 0.99   < > 0.84 0.80 0.85 0.96 0.89 0.82 0.80  CALCIUM 5.6*   < > 6.8* 7.0* 7.7* 7.9* 7.7* 8.0* 7.8*  MG 1.0*   < > 2.5* 1.8  --  1.4* 2.1 1.6* 1.7  PHOS 3.3  --  3.3  --   --  4.5 3.2  --  3.0   < > = values in this interval not displayed.    GFR: Estimated Creatinine Clearance: 83.9 mL/min (by C-G formula based on SCr of 0.8 mg/dL). Liver Function Tests: Recent Labs  Lab 01/20/23 0146 01/20/23 1622 01/22/23 0515 01/23/23 0632  AST 17 20 19 20   ALT 17 20 19 21   ALKPHOS  18* 32* 36* 41  BILITOT 0.3 1.0 0.6 0.8  PROT <3.0* 3.9* 4.3* 4.8*  ALBUMIN 1.6* 2.2* 2.3* 2.4*    No results for input(s): "LIPASE", "AMYLASE" in the last 168 hours. No results for input(s): "AMMONIA" in the last 168 hours. Coagulation Profile: Recent Labs  Lab 01/20/23 0150 01/20/23 0616 01/20/23 1130 01/20/23 1623 01/23/23 0330  INR 2.4* 1.5* 1.4* 1.2 1.1  Cardiac Enzymes: No results for input(s): "CKTOTAL", "CKMB", "CKMBINDEX", "TROPONINI" in the last 168 hours. BNP (last 3 results) No results for input(s): "PROBNP" in the last 8760 hours. HbA1C: No results for input(s): "HGBA1C" in the last 72 hours. CBG: Recent Labs  Lab 01/25/23 0011 01/25/23 0802 01/25/23 1630 01/26/23 0001 01/26/23 0811  GLUCAP 100* 98 96 112* 97    Lipid Profile: No results for input(s): "CHOL", "HDL", "LDLCALC", "TRIG", "CHOLHDL", "LDLDIRECT" in the last 72 hours.  Thyroid Function Tests: No results for input(s): "TSH", "T4TOTAL", "FREET4", "T3FREE", "THYROIDAB" in the last 72 hours. Anemia Panel: No results for input(s): "VITAMINB12", "FOLATE", "FERRITIN", "TIBC", "IRON", "RETICCTPCT" in the last 72 hours. Sepsis Labs: No results for input(s): "PROCALCITON", "LATICACIDVEN" in the last 168 hours.  No results found for this or any previous visit (from the past 240 hour(s)).    Radiology Studies: No results found.    Scheduled Meds:  Chlorhexidine Gluconate Cloth  6 each Topical Daily   pantoprazole  40 mg Intravenous Q12H   sodium chloride flush  10-40 mL Intracatheter Q12H   Continuous Infusions:  sodium chloride 250 mL (01/26/23 0847)   TPN ADULT (ION) 85 mL/hr at 01/26/23 0432   TPN ADULT (ION)       LOS: 13 days   Time spent: 25 minutes   Noralee Stain, DO [facility] 01/26/2023, 1:27 PM   Available via Epic secure chat 7am-7pm After these hours, please refer to coverage provider listed on [email or web address]

## 2023-01-26 NOTE — Progress Notes (Signed)
PHARMACY - TOTAL PARENTERAL NUTRITION CONSULT NOTE   Indication: Intolerance to enteral feeding, Duodenal obstruction  Patient Measurements: Height:  (185.4 cm) Weight: 65.3 kg (143 lb 15.4 oz) IBW/kg (Calculated) : 79.9 TPN AdjBW (KG): 68.1 Body mass index is 18.99 kg/m.   Assessment: Pt is a 59 yoM admitted on 4/8 with N/V, chronic partial SBO, history of multiple previous abdominal surgeries. Pt has very minimal PO intake with ongoing weight loss. Pharmacy consulted to manage TPN.   Glucose / Insulin: No history of DM. CBGs well controlled on goal rate and CBGs/SSI d/c.  Glucose 94.  Electrolytes: Na 134 (Na concentration 150 mEq/L), others WNL Renal: SCr WNL/stable, BUN 34 Hepatic: LFTs, Tbili, Alk Phos, all WNL. Albumin 2.4 Intake / Output; MIVF: net I/O +1.1 L - UOP: 1100 mL/24, NG output not recorded - LBM 4/20 -mIVF: none GI Imaging:  -4/4 UGI: Findings suggestive of chronic partial small bowel obstruction or ileus -4/9 CT Abd: Ileus vs high grade partial SBO GI Surgeries / Procedures:  -4/11 EGD: Gastric and small bowel dilation. Could have functional afferent loop syndrome or internal hernia - 4/14 emergent bedside EGD:  oozing cratered gastric ulcer w/ oozing hemorrhage> inj w/ epi & clips placed - 4/15: IR for emergency embolization - 4/22 planning for ex lap  Central access: Double lumen PICC placed 4/8 TPN start date: 4/9  Nutritional Goals:  Goal TPN rate is 85 mL/hr (provides 112 g of protein and 2101 kcals per day)  RD Assessment: Estimated Needs Total Energy Estimated Needs: 2050-2250 Total Protein Estimated Needs: 100-115g Total Fluid Estimated Needs: 2.1L/day  Current Nutrition:  NPO  TPN  Plan:  At 1800: Continue TPN @ goal rate of 85 mL/hr  Electrolytes in TPN: Na 154 mEq/L, K 30 mEq/L, Ca 10 mEq/L, Mg 10 mEq/L, and Phos 15 mmol/L [id number] 2:1 Add standard MVI and trace elements to TPN Completed 5 days thiamine 4/13 D/c CBG/SSI with  excellent CBG control mIVF per MD Monitor TPN labs on Mon/Thurs and PRN   Lynann Beaver PharmD, BCPS [facility] main pharmacy [phone] 01/26/2023 8:04 AM

## 2023-01-27 ENCOUNTER — Inpatient Hospital Stay (HOSPITAL_COMMUNITY): Payer: BC Managed Care – PPO | Admitting: Anesthesiology

## 2023-01-27 ENCOUNTER — Encounter (HOSPITAL_COMMUNITY): Payer: Self-pay | Admitting: Surgery

## 2023-01-27 ENCOUNTER — Other Ambulatory Visit: Payer: Self-pay

## 2023-01-27 ENCOUNTER — Inpatient Hospital Stay (HOSPITAL_COMMUNITY): Payer: BC Managed Care – PPO

## 2023-01-27 ENCOUNTER — Encounter (HOSPITAL_COMMUNITY): Admission: AD | Disposition: E | Payer: Self-pay | Source: Ambulatory Visit

## 2023-01-27 DIAGNOSIS — K2901 Acute gastritis with bleeding: Secondary | ICD-10-CM | POA: Diagnosis not present

## 2023-01-27 DIAGNOSIS — K565 Intestinal adhesions [bands], unspecified as to partial versus complete obstruction: Secondary | ICD-10-CM | POA: Diagnosis not present

## 2023-01-27 DIAGNOSIS — J9601 Acute respiratory failure with hypoxia: Secondary | ICD-10-CM | POA: Diagnosis not present

## 2023-01-27 HISTORY — PX: LAPAROTOMY: SHX154

## 2023-01-27 LAB — POCT I-STAT 7, (LYTES, BLD GAS, ICA,H+H)
Acid-base deficit: 6 mmol/L — ABNORMAL HIGH (ref 0.0–2.0)
Acid-base deficit: 13 mmol/L — ABNORMAL HIGH (ref 0.0–2.0)
Bicarbonate: 19.9 mmol/L — ABNORMAL LOW (ref 20.0–28.0)
Bicarbonate: 14.7 mmol/L — ABNORMAL LOW (ref 20.0–28.0)
Calcium, Ion: 1.13 mmol/L — ABNORMAL LOW (ref 1.15–1.40)
Calcium, Ion: 1.02 mmol/L — ABNORMAL LOW (ref 1.15–1.40)
HCT: 16 % — ABNORMAL LOW (ref 39.0–52.0)
HCT: 21 % — ABNORMAL LOW (ref 39.0–52.0)
Hemoglobin: 5.4 g/dL — CL (ref 13.0–17.0)
Hemoglobin: 7.1 g/dL — ABNORMAL LOW (ref 13.0–17.0)
O2 Saturation: 100 %
O2 Saturation: 100 %
Patient temperature: 35.4
Potassium: 4.4 mmol/L (ref 3.5–5.1)
Potassium: 3.5 mmol/L (ref 3.5–5.1)
Sodium: 138 mmol/L (ref 135–145)
Sodium: 143 mmol/L (ref 135–145)
TCO2: 21 mmol/L — ABNORMAL LOW (ref 22–32)
TCO2: 16 mmol/L — ABNORMAL LOW (ref 22–32)
pCO2 arterial: 40.9 mmHg (ref 32–48)
pCO2 arterial: 43.3 mmHg (ref 32–48)
pH, Arterial: 7.288 — ABNORMAL LOW (ref 7.35–7.45)
pH, Arterial: 7.138 — CL (ref 7.35–7.45)
pO2, Arterial: 458 mmHg — ABNORMAL HIGH (ref 83–108)
pO2, Arterial: 419 mmHg — ABNORMAL HIGH (ref 83–108)

## 2023-01-27 LAB — BPAM RBC
Blood Product Expiration Date: 202405272359
ISSUE DATE / TIME: 202404221401
ISSUE DATE / TIME: 202404221440
Unit Type and Rh: 5100
Unit Type and Rh: 5100
Unit Type and Rh: 9500
Unit Type and Rh: 9500
Unit Type and Rh: 9500

## 2023-01-27 LAB — TRIGLYCERIDES: Triglycerides: 23 mg/dL (ref <150)

## 2023-01-27 LAB — BASIC METABOLIC PANEL
Anion gap: 7 (ref 5–15)
Anion gap: 9 (ref 5–15)
Anion gap: 9 (ref 5–15)
BUN: 38 mg/dL — ABNORMAL HIGH (ref 8–23)
BUN: 31 mg/dL — ABNORMAL HIGH (ref 8–23)
BUN: 40 mg/dL — ABNORMAL HIGH (ref 8–23)
CO2: 22 mmol/L (ref 22–32)
CO2: 19 mmol/L — ABNORMAL LOW (ref 22–32)
CO2: 23 mmol/L (ref 22–32)
Calcium: 7 mg/dL — ABNORMAL LOW (ref 8.9–10.3)
Calcium: 5.9 mg/dL — CL (ref 8.9–10.3)
Calcium: 6.5 mg/dL — ABNORMAL LOW (ref 8.9–10.3)
Chloride: 109 mmol/L (ref 98–111)
Chloride: 96 mmol/L — ABNORMAL LOW (ref 98–111)
Chloride: 106 mmol/L (ref 98–111)
Creatinine, Ser: 0.97 mg/dL (ref 0.61–1.24)
Creatinine, Ser: 1.01 mg/dL (ref 0.61–1.24)
Creatinine, Ser: 1.1 mg/dL (ref 0.61–1.24)
GFR, Estimated: >60 mL/min (ref >60)
GFR, Estimated: >60 mL/min (ref >60)
GFR, Estimated: >60 mL/min (ref >60)
Glucose, Bld: 430 mg/dL — ABNORMAL HIGH (ref 70–99)
Glucose, Bld: 844 mg/dL (ref 70–99)
Glucose, Bld: 397 mg/dL — ABNORMAL HIGH (ref 70–99)
Potassium: 5.2 mmol/L — ABNORMAL HIGH (ref 3.5–5.1)
Potassium: 3.6 mmol/L (ref 3.5–5.1)
Potassium: 3.8 mmol/L (ref 3.5–5.1)
Sodium: 138 mmol/L (ref 135–145)
Sodium: 124 mmol/L — ABNORMAL LOW (ref 135–145)
Sodium: 138 mmol/L (ref 135–145)

## 2023-01-27 LAB — LACTIC ACID, PLASMA
Lactic Acid, Venous: 4 mmol/L (ref 0.5–1.9)
Lactic Acid, Venous: 6.1 mmol/L (ref 0.5–1.9)

## 2023-01-27 LAB — BLOOD GAS, ARTERIAL
Acid-base deficit: 5.8 mmol/L — ABNORMAL HIGH (ref 0.0–2.0)
Acid-base deficit: 7.1 mmol/L — ABNORMAL HIGH (ref 0.0–2.0)
Bicarbonate: 22.2 mmol/L (ref 20.0–28.0)
Bicarbonate: 20.9 mmol/L (ref 20.0–28.0)
Drawn by: 331471
FIO2: 100 %
MECHVT: 630 mL
MECHVT: 630 mL
O2 Content: 70 L/min
O2 Saturation: 100 %
O2 Saturation: 99.4 %
PEEP: 5 cmH2O
Patient temperature: 37
Patient temperature: 36.4
RATE: 15 resp/min
RATE: 20 resp/min
pCO2 arterial: 53 mmHg — ABNORMAL HIGH (ref 32–48)
pCO2 arterial: 50 mmHg — ABNORMAL HIGH (ref 32–48)
pH, Arterial: 7.23 — ABNORMAL LOW (ref 7.35–7.45)
pH, Arterial: 7.23 — ABNORMAL LOW (ref 7.35–7.45)
pO2, Arterial: 344 mmHg — ABNORMAL HIGH (ref 83–108)
pO2, Arterial: 272 mmHg — ABNORMAL HIGH (ref 83–108)

## 2023-01-27 LAB — POCT I-STAT, CHEM 8
BUN: 31 mg/dL — ABNORMAL HIGH (ref 8–23)
BUN: 31 mg/dL — ABNORMAL HIGH (ref 8–23)
Calcium, Ion: 1.13 mmol/L — ABNORMAL LOW (ref 1.15–1.40)
Calcium, Ion: 1.1 mmol/L — ABNORMAL LOW (ref 1.15–1.40)
Chloride: 104 mmol/L (ref 98–111)
Chloride: 106 mmol/L (ref 98–111)
Creatinine, Ser: 0.7 mg/dL (ref 0.61–1.24)
Creatinine, Ser: 0.6 mg/dL — ABNORMAL LOW (ref 0.61–1.24)
Glucose, Bld: 229 mg/dL — ABNORMAL HIGH (ref 70–99)
Glucose, Bld: 348 mg/dL — ABNORMAL HIGH (ref 70–99)
HCT: 18 % — ABNORMAL LOW (ref 39.0–52.0)
HCT: 25 % — ABNORMAL LOW (ref 39.0–52.0)
Hemoglobin: 6.1 g/dL — CL (ref 13.0–17.0)
Hemoglobin: 8.5 g/dL — ABNORMAL LOW (ref 13.0–17.0)
Potassium: 4.9 mmol/L (ref 3.5–5.1)
Potassium: 4.7 mmol/L (ref 3.5–5.1)
Sodium: 137 mmol/L (ref 135–145)
Sodium: 138 mmol/L (ref 135–145)
TCO2: 23 mmol/L (ref 22–32)
TCO2: 23 mmol/L (ref 22–32)

## 2023-01-27 LAB — TYPE AND SCREEN
ABO/RH(D): O NEG
Unit division: 0
Unit division: 0
Unit division: 0

## 2023-01-27 LAB — PREPARE FRESH FROZEN PLASMA

## 2023-01-27 LAB — CBC
HCT: 32.8 % — ABNORMAL LOW (ref 39.0–52.0)
HCT: 28.8 % — ABNORMAL LOW (ref 39.0–52.0)
Hemoglobin: 10.4 g/dL — ABNORMAL LOW (ref 13.0–17.0)
Hemoglobin: 9.2 g/dL — ABNORMAL LOW (ref 13.0–17.0)
MCH: 30.7 pg (ref 26.0–34.0)
MCH: 29.5 pg (ref 26.0–34.0)
MCHC: 31.7 g/dL (ref 30.0–36.0)
MCHC: 31.9 g/dL (ref 30.0–36.0)
MCV: 96.8 fL (ref 80.0–100.0)
MCV: 92.3 fL (ref 80.0–100.0)
Platelets: 134 10*3/uL — ABNORMAL LOW (ref 150–400)
Platelets: 101 10*3/uL — ABNORMAL LOW (ref 150–400)
RBC: 3.39 MIL/uL — ABNORMAL LOW (ref 4.22–5.81)
RBC: 3.12 MIL/uL — ABNORMAL LOW (ref 4.22–5.81)
RDW: 14.3 % (ref 11.5–15.5)
RDW: 16.7 % — ABNORMAL HIGH (ref 11.5–15.5)
WBC: 4.6 10*3/uL (ref 4.0–10.5)
WBC: 7.7 10*3/uL (ref 4.0–10.5)
nRBC: 0 % (ref 0.0–0.2)
nRBC: 0 % (ref 0.0–0.2)

## 2023-01-27 LAB — GLUCOSE, RANDOM: Glucose, Bld: 819 mg/dL (ref 70–99)

## 2023-01-27 LAB — COMPREHENSIVE METABOLIC PANEL
ALT: 53 U/L — ABNORMAL HIGH (ref 0–44)
AST: 32 U/L (ref 15–41)
Albumin: 2.6 g/dL — ABNORMAL LOW (ref 3.5–5.0)
Alkaline Phosphatase: 53 U/L (ref 38–126)
Anion gap: 5 (ref 5–15)
BUN: 36 mg/dL — ABNORMAL HIGH (ref 8–23)
CO2: 28 mmol/L (ref 22–32)
Calcium: 8 mg/dL — ABNORMAL LOW (ref 8.9–10.3)
Chloride: 103 mmol/L (ref 98–111)
Creatinine, Ser: 0.79 mg/dL (ref 0.61–1.24)
GFR, Estimated: >60 mL/min (ref >60)
Glucose, Bld: 99 mg/dL (ref 70–99)
Potassium: 4.4 mmol/L (ref 3.5–5.1)
Sodium: 136 mmol/L (ref 135–145)
Total Bilirubin: 0.5 mg/dL (ref 0.3–1.2)
Total Protein: 5.2 g/dL — ABNORMAL LOW (ref 6.5–8.1)

## 2023-01-27 LAB — CBC WITH DIFFERENTIAL/PLATELET
Abs Immature Granulocytes: 0.08 10*3/uL — ABNORMAL HIGH (ref 0.00–0.07)
Basophils Absolute: 0 10*3/uL (ref 0.0–0.1)
Basophils Relative: 0 %
Eosinophils Absolute: 0 10*3/uL (ref 0.0–0.5)
Eosinophils Relative: 0 %
HCT: 27.7 % — ABNORMAL LOW (ref 39.0–52.0)
Hemoglobin: 8.4 g/dL — ABNORMAL LOW (ref 13.0–17.0)
Immature Granulocytes: 1 %
Lymphocytes Relative: 11 %
Lymphs Abs: 0.7 10*3/uL (ref 0.7–4.0)
MCH: 29.7 pg (ref 26.0–34.0)
MCHC: 30.3 g/dL (ref 30.0–36.0)
MCV: 97.9 fL (ref 80.0–100.0)
Monocytes Absolute: 0.4 10*3/uL (ref 0.1–1.0)
Monocytes Relative: 7 %
Neutro Abs: 5.5 10*3/uL (ref 1.7–7.7)
Neutrophils Relative %: 81 %
Platelets: 119 10*3/uL — ABNORMAL LOW (ref 150–400)
RBC: 2.83 MIL/uL — ABNORMAL LOW (ref 4.22–5.81)
RDW: 16.6 % — ABNORMAL HIGH (ref 11.5–15.5)
WBC Morphology: INCREASED
WBC: 6.8 10*3/uL (ref 4.0–10.5)
nRBC: 0 % (ref 0.0–0.2)

## 2023-01-27 LAB — BPAM CRYOPRECIPITATE: Unit Type and Rh: 5100

## 2023-01-27 LAB — GLUCOSE, CAPILLARY
Glucose-Capillary: 109 mg/dL — ABNORMAL HIGH (ref 70–99)
Glucose-Capillary: 277 mg/dL — ABNORMAL HIGH (ref 70–99)
Glucose-Capillary: 302 mg/dL — ABNORMAL HIGH (ref 70–99)
Glucose-Capillary: 87 mg/dL (ref 70–99)

## 2023-01-27 LAB — BPAM FFP
Blood Product Expiration Date: 202404272359
Blood Product Expiration Date: 202404272359
Blood Product Expiration Date: 202405042359
Blood Product Expiration Date: 202405052359
ISSUE DATE / TIME: 202404221936
ISSUE DATE / TIME: 202404222303
ISSUE DATE / TIME: 202404222326
Unit Type and Rh: 5100
Unit Type and Rh: 6200
Unit Type and Rh: 6200
Unit Type and Rh: 6200

## 2023-01-27 LAB — BPAM PLATELET PHERESIS

## 2023-01-27 LAB — PREPARE RBC (CROSSMATCH)

## 2023-01-27 LAB — MAGNESIUM: Magnesium: 2 mg/dL (ref 1.7–2.4)

## 2023-01-27 LAB — PROTIME-INR
INR: 1.6 — ABNORMAL HIGH (ref 0.8–1.2)
Prothrombin Time: 19.2 seconds — ABNORMAL HIGH (ref 11.4–15.2)

## 2023-01-27 LAB — PHOSPHORUS: Phosphorus: 3.1 mg/dL (ref 2.5–4.6)

## 2023-01-27 LAB — PREPARE PLATELET PHERESIS: Unit division: 0

## 2023-01-27 LAB — TROPONIN I (HIGH SENSITIVITY): Troponin I (High Sensitivity): 6 ng/L (ref <18)

## 2023-01-27 SURGERY — LAPAROTOMY, EXPLORATORY
Anesthesia: General | Site: Abdomen

## 2023-01-27 MED ORDER — CHLORHEXIDINE GLUCONATE CLOTH 2 % EX PADS
6.0000 | MEDICATED_PAD | Freq: Once | CUTANEOUS | Status: AC
  Administered 2023-01-27: 6 via TOPICAL

## 2023-01-27 MED ORDER — LIDOCAINE HCL (PF) 2 % IJ SOLN
INTRAMUSCULAR | Status: AC
  Filled 2023-01-27: qty 5

## 2023-01-27 MED ORDER — FENTANYL CITRATE (PF) 250 MCG/5ML IJ SOLN
INTRAMUSCULAR | Status: AC
  Filled 2023-01-27: qty 5

## 2023-01-27 MED ORDER — PHENYLEPHRINE HCL-NACL 20-0.9 MG/250ML-% IV SOLN
INTRAVENOUS | Status: DC | PRN
  Administered 2023-01-27: 40 ug/min via INTRAVENOUS

## 2023-01-27 MED ORDER — PROPOFOL 10 MG/ML IV BOLUS
INTRAVENOUS | Status: AC
  Filled 2023-01-27: qty 20

## 2023-01-27 MED ORDER — STERILE WATER FOR INJECTION IV SOLN
INTRAVENOUS | Status: DC
  Filled 2023-01-27: qty 1000

## 2023-01-27 MED ORDER — DEXTROSE 50 % IV SOLN: 0.0000 mL | INTRAVENOUS | Status: DC | PRN

## 2023-01-27 MED ORDER — LIDOCAINE 2% (20 MG/ML) 5 ML SYRINGE
INTRAMUSCULAR | Status: DC | PRN
  Administered 2023-01-27: 20 mg via INTRAVENOUS

## 2023-01-27 MED ORDER — SODIUM CHLORIDE 0.9% IV SOLUTION: Freq: Once | INTRAVENOUS | Status: DC

## 2023-01-27 MED ORDER — FENTANYL CITRATE PF 50 MCG/ML IJ SOSY
50.0000 ug | PREFILLED_SYRINGE | INTRAMUSCULAR | Status: DC | PRN
  Administered 2023-01-27: 25 ug via INTRAVENOUS
  Filled 2023-01-27: qty 1

## 2023-01-27 MED ORDER — NOREPINEPHRINE 4 MG/250ML-% IV SOLN
0.0000 ug/min | INTRAVENOUS | Status: DC
  Filled 2023-01-27: qty 250

## 2023-01-27 MED ORDER — CEFAZOLIN SODIUM-DEXTROSE 2-4 GM/100ML-% IV SOLN
2.0000 g | INTRAVENOUS | Status: AC
  Administered 2023-01-27: 2 g via INTRAVENOUS
  Filled 2023-01-27: qty 100

## 2023-01-27 MED ORDER — ALBUMIN HUMAN 5 % IV SOLN
INTRAVENOUS | Status: AC
  Filled 2023-01-27: qty 250

## 2023-01-27 MED ORDER — ONDANSETRON HCL 4 MG/2ML IJ SOLN
INTRAMUSCULAR | Status: AC
  Filled 2023-01-27: qty 2

## 2023-01-27 MED ORDER — FENTANYL CITRATE PF 50 MCG/ML IJ SOSY
50.0000 ug | PREFILLED_SYRINGE | INTRAMUSCULAR | Status: DC | PRN
  Administered 2023-01-28: 50 ug via INTRAVENOUS
  Filled 2023-01-27 (×3): qty 1

## 2023-01-27 MED ORDER — INSULIN REGULAR(HUMAN) IN NACL 100-0.9 UT/100ML-% IV SOLN: INTRAVENOUS | Status: DC

## 2023-01-27 MED ORDER — INSULIN REGULAR(HUMAN) IN NACL 100-0.9 UT/100ML-% IV SOLN
INTRAVENOUS | Status: DC
  Administered 2023-01-28: 11 [IU]/h via INTRAVENOUS
  Administered 2023-01-28: 20 [IU]/h via INTRAVENOUS
  Filled 2023-01-27 (×2): qty 100

## 2023-01-27 MED ORDER — ORAL CARE MOUTH RINSE: 15.0000 mL | OROMUCOSAL | Status: DC | PRN

## 2023-01-27 MED ORDER — SODIUM CHLORIDE 0.9% IV SOLUTION: Freq: Once | INTRAVENOUS | Status: AC

## 2023-01-27 MED ORDER — FENTANYL CITRATE (PF) 100 MCG/2ML IJ SOLN: INTRAMUSCULAR | Status: DC | PRN

## 2023-01-27 MED ORDER — ROCURONIUM BROMIDE 10 MG/ML (PF) SYRINGE
PREFILLED_SYRINGE | INTRAVENOUS | Status: AC
  Filled 2023-01-27: qty 10

## 2023-01-27 MED ORDER — SODIUM BICARBONATE 8.4 % IV SOLN
INTRAVENOUS | Status: AC
  Filled 2023-01-27: qty 100

## 2023-01-27 MED ORDER — ALBUMIN HUMAN 5 % IV SOLN: INTRAVENOUS | Status: DC | PRN

## 2023-01-27 MED ORDER — EPINEPHRINE 1 MG/10ML IJ SOSY
PREFILLED_SYRINGE | INTRAMUSCULAR | Status: DC | PRN
  Administered 2023-01-27 (×2): 20 ug via INTRAVENOUS

## 2023-01-27 MED ORDER — MIDAZOLAM HCL 2 MG/2ML IJ SOLN
INTRAMUSCULAR | Status: AC
  Filled 2023-01-27: qty 2

## 2023-01-27 MED ORDER — VASOPRESSIN 20 UNIT/ML IV SOLN
0.0000 [IU]/min | INTRAVENOUS | Status: DC
  Administered 2023-01-27: 0.04 [IU]/min via INTRAVENOUS
  Filled 2023-01-27 (×3): qty 2.5

## 2023-01-27 MED ORDER — ROCURONIUM BROMIDE 10 MG/ML (PF) SYRINGE
PREFILLED_SYRINGE | INTRAVENOUS | Status: DC | PRN
  Administered 2023-01-27 (×2): 20 mg via INTRAVENOUS
  Administered 2023-01-27: 40 mg via INTRAVENOUS
  Administered 2023-01-27: 20 mg via INTRAVENOUS
  Administered 2023-01-27: 40 mg via INTRAVENOUS
  Administered 2023-01-27 (×3): 20 mg via INTRAVENOUS

## 2023-01-27 MED ORDER — EPINEPHRINE 1 MG/10ML IJ SOSY
PREFILLED_SYRINGE | INTRAMUSCULAR | Status: AC
  Filled 2023-01-27: qty 10

## 2023-01-27 MED ORDER — DEXAMETHASONE SODIUM PHOSPHATE 10 MG/ML IJ SOLN
INTRAMUSCULAR | Status: DC | PRN
  Administered 2023-01-27: 10 mg via INTRAVENOUS

## 2023-01-27 MED ORDER — NOREPINEPHRINE 4 MG/250ML-% IV SOLN
0.0000 ug/min | INTRAVENOUS | Status: DC
  Administered 2023-01-27: 3 ug/min via INTRAVENOUS
  Administered 2023-01-27: 70 ug/min via INTRAVENOUS
  Administered 2023-01-28: 20 ug/min via INTRAVENOUS
  Administered 2023-01-28 (×2): 12 ug/min via INTRAVENOUS
  Administered 2023-01-28: 65 ug/min via INTRAVENOUS
  Administered 2023-01-28: 50 ug/min via INTRAVENOUS
  Administered 2023-01-28 – 2023-01-29 (×2): 35 ug/min via INTRAVENOUS
  Filled 2023-01-27 (×12): qty 250
  Filled 2023-01-27: qty 500
  Filled 2023-01-27 (×6): qty 250

## 2023-01-27 MED ORDER — DEXMEDETOMIDINE HCL IN NACL 200 MCG/50ML IV SOLN
INTRAVENOUS | Status: AC
  Filled 2023-01-27: qty 50

## 2023-01-27 MED ORDER — FENTANYL CITRATE PF 50 MCG/ML IJ SOSY
25.0000 ug | PREFILLED_SYRINGE | INTRAMUSCULAR | Status: DC | PRN
  Administered 2023-01-27: 50 ug via INTRAVENOUS
  Administered 2023-01-27: 100 ug via INTRAVENOUS
  Filled 2023-01-27: qty 2

## 2023-01-27 MED ORDER — STERILE WATER FOR INJECTION IJ SOLN
INTRAMUSCULAR | Status: AC
  Administered 2023-01-28: 10 mL
  Filled 2023-01-27: qty 10

## 2023-01-27 MED ORDER — SUCCINYLCHOLINE CHLORIDE 200 MG/10ML IV SOSY
PREFILLED_SYRINGE | INTRAVENOUS | Status: DC | PRN
  Administered 2023-01-27: 140 mg via INTRAVENOUS

## 2023-01-27 MED ORDER — FENTANYL CITRATE (PF) 250 MCG/5ML IJ SOLN
INTRAMUSCULAR | Status: DC | PRN
  Administered 2023-01-27 (×2): 50 ug via INTRAVENOUS
  Administered 2023-01-27: 100 ug via INTRAVENOUS

## 2023-01-27 MED ORDER — DEXAMETHASONE SODIUM PHOSPHATE 10 MG/ML IJ SOLN
INTRAMUSCULAR | Status: AC
  Filled 2023-01-27: qty 1

## 2023-01-27 MED ORDER — 0.9 % SODIUM CHLORIDE (POUR BTL) OPTIME
TOPICAL | Status: DC | PRN
  Administered 2023-01-27: 2000 mL
  Administered 2023-01-27 (×2): 1000 mL
  Administered 2023-01-27: 2000 mL

## 2023-01-27 MED ORDER — CALCIUM GLUCONATE-NACL 1-0.675 GM/50ML-% IV SOLN
1.0000 g | Freq: Once | INTRAVENOUS | Status: DC
  Filled 2023-01-27: qty 50

## 2023-01-27 MED ORDER — PHENYLEPHRINE 80 MCG/ML (10ML) SYRINGE FOR IV PUSH (FOR BLOOD PRESSURE SUPPORT)
PREFILLED_SYRINGE | INTRAVENOUS | Status: AC
  Filled 2023-01-27: qty 10

## 2023-01-27 MED ORDER — CALCIUM CHLORIDE 10 % IV SOLN
INTRAVENOUS | Status: AC
  Filled 2023-01-27: qty 10

## 2023-01-27 MED ORDER — ONDANSETRON HCL 4 MG/2ML IJ SOLN
INTRAMUSCULAR | Status: DC | PRN
  Administered 2023-01-27: 4 mg via INTRAVENOUS

## 2023-01-27 MED ORDER — SODIUM CHLORIDE 0.9 % IV SOLN
10.0000 mL/h | Freq: Once | INTRAVENOUS | Status: AC
  Administered 2023-01-27: 10 mL/h via INTRAVENOUS

## 2023-01-27 MED ORDER — SUCCINYLCHOLINE CHLORIDE 200 MG/10ML IV SOSY
PREFILLED_SYRINGE | INTRAVENOUS | Status: AC
  Filled 2023-01-27: qty 10

## 2023-01-27 MED ORDER — HEMOSTATIC AGENTS (NO CHARGE) OPTIME
TOPICAL | Status: DC | PRN
  Administered 2023-01-27: 1 via TOPICAL

## 2023-01-27 MED ORDER — PROPOFOL 10 MG/ML IV BOLUS
INTRAVENOUS | Status: DC | PRN
  Administered 2023-01-27: 100 mg via INTRAVENOUS

## 2023-01-27 MED ORDER — CALCIUM CHLORIDE 10 % IV SOLN
INTRAVENOUS | Status: DC | PRN
  Administered 2023-01-27: 500 mg via INTRAVENOUS

## 2023-01-27 MED ORDER — VASOPRESSIN 20 UNIT/ML IV SOLN
INTRAVENOUS | Status: DC | PRN
  Administered 2023-01-27 (×8): 2 [IU] via INTRAVENOUS
  Administered 2023-01-27: 1 [IU] via INTRAVENOUS
  Administered 2023-01-27: 3 [IU] via INTRAVENOUS
  Administered 2023-01-27 (×4): 2 [IU] via INTRAVENOUS

## 2023-01-27 MED ORDER — TRAVASOL 10 % IV SOLN
INTRAVENOUS | Status: AC
  Filled 2023-01-27: qty 1122

## 2023-01-27 MED ORDER — ORAL CARE MOUTH RINSE
15.0000 mL | OROMUCOSAL | Status: DC
  Administered 2023-01-27 – 2023-01-28 (×6): 15 mL via OROMUCOSAL

## 2023-01-27 MED ORDER — VASOPRESSIN 20 UNITS/100 ML INFUSION FOR SHOCK
INTRAVENOUS | Status: AC
  Administered 2023-01-28: 0.03 [IU]/min via INTRAVENOUS
  Filled 2023-01-27: qty 100

## 2023-01-27 MED ORDER — CHLORHEXIDINE GLUCONATE CLOTH 2 % EX PADS
6.0000 | MEDICATED_PAD | Freq: Once | CUTANEOUS | Status: AC
  Administered 2023-01-26: 6 via TOPICAL

## 2023-01-27 MED ORDER — SODIUM BICARBONATE 8.4 % IV SOLN
100.0000 meq | Freq: Once | INTRAVENOUS | Status: AC
  Administered 2023-01-27: 100 meq via INTRAVENOUS

## 2023-01-27 MED ORDER — SODIUM CHLORIDE 0.9 % IV SOLN
0.5000 ug/min | INTRAVENOUS | Status: DC
  Filled 2023-01-27: qty 10

## 2023-01-27 MED ORDER — METRONIDAZOLE 500 MG/100ML IV SOLN
500.0000 mg | INTRAVENOUS | Status: AC
  Administered 2023-01-27: 500 mg via INTRAVENOUS
  Filled 2023-01-27: qty 100

## 2023-01-27 MED ORDER — DEXMEDETOMIDINE HCL IN NACL 200 MCG/50ML IV SOLN
0.0000 ug/kg/h | INTRAVENOUS | Status: DC
  Administered 2023-01-27: 1 ug/kg/h via INTRAVENOUS
  Administered 2023-01-27: .7 ug/kg/h via INTRAVENOUS
  Administered 2023-01-28: 0.4 ug/kg/h via INTRAVENOUS
  Administered 2023-01-28: 1.1 ug/kg/h via INTRAVENOUS
  Administered 2023-01-29: 0.5 ug/kg/h via INTRAVENOUS
  Filled 2023-01-27 (×6): qty 50

## 2023-01-27 MED ORDER — PHENYLEPHRINE 80 MCG/ML (10ML) SYRINGE FOR IV PUSH (FOR BLOOD PRESSURE SUPPORT)
PREFILLED_SYRINGE | INTRAVENOUS | Status: DC | PRN
  Administered 2023-01-27 (×5): 160 ug via INTRAVENOUS

## 2023-01-27 MED ORDER — EPINEPHRINE HCL 5 MG/250ML IV SOLN IN NS
0.5000 ug/min | INTRAVENOUS | Status: DC
  Administered 2023-01-27: 3 ug/min via INTRAVENOUS
  Administered 2023-01-28: 20 ug/min via INTRAVENOUS
  Administered 2023-01-28: 0.7 ug/min via INTRAVENOUS
  Administered 2023-01-29: 0.5 ug/min via INTRAVENOUS
  Administered 2023-01-29: 10 ug/min via INTRAVENOUS
  Filled 2023-01-27 (×6): qty 250

## 2023-01-27 MED ORDER — FAMOTIDINE 20 MG PO TABS: 20.0000 mg | ORAL_TABLET | Freq: Two times a day (BID) | ORAL | Status: DC

## 2023-01-27 MED ORDER — CALCIUM CHLORIDE 10 % IV SOLN
2.0000 g | Freq: Once | INTRAVENOUS | Status: AC
  Administered 2023-01-27: 2 g via INTRAVENOUS
  Filled 2023-01-27: qty 10

## 2023-01-27 MED ORDER — VASOPRESSIN 20 UNIT/ML IV SOLN
INTRAVENOUS | Status: AC
  Filled 2023-01-27: qty 1

## 2023-01-27 MED ORDER — MIDAZOLAM HCL 2 MG/2ML IJ SOLN
INTRAMUSCULAR | Status: DC | PRN
  Administered 2023-01-27 (×2): 1 mg via INTRAVENOUS

## 2023-01-27 MED ORDER — PHENYLEPHRINE HCL-NACL 20-0.9 MG/250ML-% IV SOLN
INTRAVENOUS | Status: AC
  Filled 2023-01-27: qty 250

## 2023-01-27 MED ORDER — STERILE WATER FOR INJECTION IJ SOLN
INTRAMUSCULAR | Status: AC
  Administered 2023-01-28: 10 mL
  Filled 2023-01-27: qty 20

## 2023-01-27 MED ORDER — LACTATED RINGERS IV SOLN: INTRAVENOUS | Status: DC | PRN

## 2023-01-27 MED ORDER — CALCIUM CHLORIDE 10 % IV SOLN
1.0000 g | Freq: Once | INTRAVENOUS | Status: DC
  Filled 2023-01-27: qty 10

## 2023-01-27 MED ORDER — SODIUM BICARBONATE 8.4 % IV SOLN
INTRAVENOUS | Status: DC | PRN
  Administered 2023-01-27 (×2): 50 meq via INTRAVENOUS

## 2023-01-27 MED ORDER — LACTATED RINGERS IV SOLN: INTRAVENOUS | Status: DC

## 2023-01-27 SURGICAL SUPPLY — 52 items
APL PRP STRL LF DISP 70% ISPRP (MISCELLANEOUS)
APL SRG 38 LTWT LNG FL B (MISCELLANEOUS) ×1
APPLICATOR ARGON PLASMA 100 (MISCELLANEOUS) IMPLANT
APPLICATOR ARISTA FLEXITIP XL (MISCELLANEOUS) IMPLANT
BAG COUNTER SPONGE SURGICOUNT (BAG) IMPLANT
BAG SPNG CNTER NS LX DISP (BAG)
CATH MUSHROOM 22FR (CATHETERS) IMPLANT
CHLORAPREP W/TINT 26 (MISCELLANEOUS) IMPLANT
COVER MAYO STAND STRL (DRAPES) ×1 IMPLANT
COVER SURGICAL LIGHT HANDLE (MISCELLANEOUS) ×1 IMPLANT
DRAIN CHANNEL 19F RND (DRAIN) IMPLANT
DRAPE LAPAROSCOPIC ABDOMINAL (DRAPES) ×1 IMPLANT
DRSG OPSITE POSTOP 4X10 (GAUZE/BANDAGES/DRESSINGS) IMPLANT
DRSG OPSITE POSTOP 4X12 (GAUZE/BANDAGES/DRESSINGS) IMPLANT
DRSG OPSITE POSTOP 4X6 (GAUZE/BANDAGES/DRESSINGS) IMPLANT
DRSG OPSITE POSTOP 4X8 (GAUZE/BANDAGES/DRESSINGS) IMPLANT
ELECT REM PT RETURN 15FT ADLT (MISCELLANEOUS) ×1 IMPLANT
EVACUATOR SILICONE 100CC (DRAIN) IMPLANT
GLOVE BIO SURGEON STRL SZ 6 (GLOVE) ×2 IMPLANT
GLOVE BIOGEL PI MICRO STRL 5.5 (GLOVE) ×1 IMPLANT
GLOVE INDICATOR 6.5 STRL GRN (GLOVE) ×2 IMPLANT
GOWN STRL REUS W/ TWL LRG LVL3 (GOWN DISPOSABLE) ×1 IMPLANT
GOWN STRL REUS W/TWL LRG LVL3 (GOWN DISPOSABLE) ×1
HEMOSTAT ARISTA ABSORB 3G PWDR (HEMOSTASIS) IMPLANT
KIT TURNOVER KIT A (KITS) IMPLANT
LIGASURE IMPACT 36 18CM CVD LR (INSTRUMENTS) IMPLANT
PACK GENERAL/GYN (CUSTOM PROCEDURE TRAY) ×1 IMPLANT
RELOAD PROXIMATE 75MM BLUE (ENDOMECHANICALS) ×1 IMPLANT
RELOAD STAPLE 75 3.8 BLU REG (ENDOMECHANICALS) IMPLANT
SPONGE T-LAP 18X18 ~~LOC~~+RFID (SPONGE) IMPLANT
STAPLER PROXIMATE 75MM BLUE (STAPLE) IMPLANT
STAPLER VISISTAT 35W (STAPLE) IMPLANT
SUT ETHILON 2 0 PS N (SUTURE) IMPLANT
SUT ETHILON 3 0 FSL (SUTURE) IMPLANT
SUT ETHILON 3 0 PS 1 (SUTURE) IMPLANT
SUT MNCRL AB 4-0 PS2 18 (SUTURE) IMPLANT
SUT NOVA T20/GS 25 (SUTURE) IMPLANT
SUT PDS AB 1 TP1 96 (SUTURE) IMPLANT
SUT PDS AB 4-0 RB1 27 (SUTURE) IMPLANT
SUT SILK 2 0 (SUTURE) ×1
SUT SILK 2 0 SH CR/8 (SUTURE) ×1 IMPLANT
SUT SILK 2-0 18XBRD TIE 12 (SUTURE) ×1 IMPLANT
SUT SILK 3 0 (SUTURE) ×1
SUT SILK 3 0 SH CR/8 (SUTURE) ×1 IMPLANT
SUT SILK 3-0 18XBRD TIE 12 (SUTURE) ×1 IMPLANT
SUT VIC AB 3-0 SH 18 (SUTURE) IMPLANT
SUT VIC AB 3-0 SH 8-18 (SUTURE) IMPLANT
TOWEL OR 17X26 10 PK STRL BLUE (TOWEL DISPOSABLE) IMPLANT
TOWEL OR NON WOVEN STRL DISP B (DISPOSABLE) ×1 IMPLANT
TRAY FOLEY MTR SLVR 14FR STAT (SET/KITS/TRAYS/PACK) ×1 IMPLANT
TRAY FOLEY MTR SLVR 16FR STAT (SET/KITS/TRAYS/PACK) ×1 IMPLANT
TUBE T SILICONE 10FR (TUBING) IMPLANT

## 2023-01-27 NOTE — Anesthesia Postprocedure Evaluation (Signed)
Anesthesia Post Note  Patient: Bradley Dominguez  Procedure(s) Performed: EXPLORATORY LAPAROTOMY, LYSIS OF ADHESIONS, DUODENOJEJUNAL BYPASS, PLACEMENT OF BILIARY T-TUBE, PLACEMENT OF DUODENOSTOMY TUBE (Abdomen)     Patient location during evaluation: SICU Anesthesia Type: General Level of consciousness: sedated Pain management: pain level controlled Vital Signs Assessment: vitals unstable Respiratory status: patient remains intubated per anesthesia plan Cardiovascular status: unstable Postop Assessment: no apparent nausea or vomiting Anesthetic complications: no   No notable events documented.  Last Vitals:  Vitals:   01/27/23 1825 01/27/23 1827  BP:    Pulse: (!) 143 (!) 142  Resp: 20 20  Temp: (!) 36.4 C (!) 36.4 C  SpO2:      Last Pain:  Vitals:   01/27/23 1810  TempSrc: Oral  PainSc:                  Huber Mathers

## 2023-01-27 NOTE — Progress Notes (Signed)
NAME:  Bradley Dominguez, MRN:  [id number], DOB:  21-Mar-1956, LOS: 14 ADMISSION DATE:  01/13/2023, CONSULTATION DATE: 01/19/2023 REFERRING MD: Dr. Freida Busman, CHIEF COMPLAINT: GI bleed  History of Present Illness:  67 yo M HF, chronic partial small bowel obstruction and multiple previous abd surgeries  who was admitted to [facility] 4/8 from home w n/v/bloating. GI consult 4/10, underwent EGD 4/11 -- concerning for duodenal obstruction. Plan for 4/15 ex lap LOA duodenojejunal bypass.    On 4/14 started vomiting blood, dark red and clots noted. BP on lower end but baseline BP with systolics in 80's to low 90's so not far. Repeat hgb in 8's from 9. Denies pain or nausea, just fatigued. GI reconsulted and considering repeat endoscopy.  Developed hemorrhagic shock 4/15 in the setting of hematemesis / hematochezia, decompensated requiring MTP, intubation, TXA and embolization of jejunal arcade branch.    PCCM consulted.  Pertinent  Medical History   has a past medical history of Arthritis, Bowel obstruction, CHF (congestive heart failure) (11/26/2018), Elevated brain natriuretic peptide (BNP) level (11/25/2018), Family history of adverse reaction to anesthesia, Frequent UTI, History of colon polyps (2009), History of shingles, Plantar fasciitis, Prostate hypertrophy, and Vitamin B 12 deficiency.  Significant Hospital Events: Including procedures, antibiotic start and stop dates in addition to other pertinent events   4/8 Admit 4/11 EGD > duodenal obstruction 4/14 vomiting blood, hypotensive pccm consult 4/15 Significant hematemesis / hematochezia, decompensated > intubated. IR for embolization of jejunal arcade branch, MTP required, TXA. 4/17 Extubated  4/20 PCCM off, [facility] assisting with medical care. [facility] remains primary.  4/22 OR for ex-lap, lysis of adhesions, returned on vent, shock > ABLA  Interim History / Subjective:  Returned to ICU on vent, post op.  Per report had 2L EBL+.  Per CRNA > got 4 units PRBC, 1  L albumin, 2L crystalloid, new R radial aline, on EPI , Levo 20 mcg, Dex 0.7, 2amps bicarb prior to arrival, additional neosynephrine in case + bolus epi.   Objective   Blood pressure 114/70, pulse 81, temperature (!) 97.5 F (36.4 C), temperature source Oral, resp. rate 15, height  (1.854 m), weight 65.5 kg, SpO2 98 %.    Vent Mode: PRVC FiO2 (%):  [70 %-100 %] 70 % Set Rate:  [15 bmp-18 bmp] 18 bmp Vt Set:  [630 mL] 630 mL PEEP:  [5 cmH20] 5 cmH20 Plateau Pressure:  [16 cmH20] 16 cmH20   Intake/Output Summary (Last 24 hours) at 01/27/2023 1833 Last data filed at 01/27/2023 1830 Gross per 24 hour  Intake 9248.28 ml  Output 4210 ml  Net 5038.28 ml   Filed Weights   01/26/23 0500 01/27/23 0500 01/27/23 0937  Weight: 65.3 kg 65.5 kg 65.5 kg    Examination:  General: critically ill appearing adult male lying in bed on vent post-operative HEENT: MM pink/dry, pale, ETT, pupils 4mm =/reactive Neuro: sedate/paralyzed post OR CV: s1s2 RRR, ST 140's, no m/r/g PULM: non-labored on vent, good air entry bilaterally without wheeze  GI: soft, midline abd dressing intact, biliary T-tube in place with green/brown clear, duodenostomy tube in place with brown drainage, JP drain with thin serosanguinous drainage Extremities: warm/dry, no peripheral edema  Skin: no rashes or lesions  Resolved Hospital Problem list     Assessment & Plan:   Hemorrhagic Shock  Acute Blood Loss Anemia DIC (pre-op) Secondary to gastric ulcer s/p clip 4/14 and 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 Required MTP protocol, IR for  embolization.  -appreciate [facility] -now 1 unit PRBC, 1 pk platelets, 1 unit FFP, 1 cryo -now LR bolus , 2 amps sodium bicarbonate  -CBC Q6 for 3 occurrences, starting at 2100 -monitor abd exam for further bleeding, drain output  -follow up DIC labs -assess EKG, troponin, lactic acid now -1 gm calcium with transfusions   Acute Respiratory Failure  with Hypoxia Intubated 4/15 in setting of hemorrhagic shock. Pending return to OR for duodenal obstruction at some point.  -PRVC with LTVV -increase rate to 20 with metabolic acidosis, repeat ABG at 2000 -follow up CXR now & in am  -wean O2 for sats >90% -no SBT / WUA in am 4/23   Duodenal Obstruction Nonfunctioning prior GJ bypass. Initial plan was for surgery 4/15, but developed hemorrhagic shock. EGD path negative.   -4/22 post ex-lap with lysis of adhesions, T-tube placed for CBD, duodenostomy tube -Dr. Freida Busman anticipates ileus post operatively -NPO  -continue TPN  -NGT to LIS  -NOTHING BY NGT > per Dr. Freida Busman  -no dressing on drains per [facility] / Dr. Freida Busman  At Risk AKI  Acute Metabolic Acidosis / Lactic Acidosis  -now BMP  -Trend BMP / urinary output -Replace electrolytes as indicated -Avoid nephrotoxic agents, ensure adequate renal perfusion  Hyperglycemia -repeat glucose peripherally  -add insulin infusion   Acute Metabolic Encephalopathy post ABD Surgery  -PAD protocol with precedex -PRN low dose fentanyl  -may require transition to fentanyl infusion as pain/sedation   Severe Protein Calorie Malnutrition due to Duodenal Obstruction (POA) -TPN per pharmacy   Central Access -PICC line care per NSGY  -right radial art line care  Left Arm Swelling, DVT RULED OUT  Superficial Vein Thrombosis Positive Left Basilic Vein, L Cephalic Vein  -warm compress, elevation of left arm   Best Practice (right click and "Reselect all SmartList Selections" daily)  Diet/type: TPN DVT prophylaxis: SCD GI prophylaxis: PPI BID Lines: Central line and PICC Foley:  Yes, and it is still needed Code Status:  full code Last date of multidisciplinary goals of care discussion:  per [facility] 4/22.  Dr. Judeth Horn updated wife in detail on post operative course to include EBL, shock, PRBC resuscitation on arrival to ICU.    Dr. Freida Busman updated on patients status prior to hand-off to pm shift. Aware of  improvement in hemodynamics. PCCM night team aware of patient / verbal sign out. Appreciate ongoing support.   Critical Care Time: 60 minutes   Canary Brim, MSN, APRN, NP-C, AGACNP-BC [facility] 01/27/2023, 6:33 PM   Please see [email or web address] for pager details.   From 7A-7P if no response, please call [phone] After hours, please call ELink [phone]

## 2023-01-27 NOTE — Anesthesia Procedure Notes (Signed)
Arterial Line Insertion Start/End4/22/2024 5:11 PM, 01/27/2023 5:18 PM Performed by: Val Eagle, MD  Patient location: OR. Preanesthetic checklist: patient identified, IV checked, risks and benefits discussed, surgical consent, monitors and equipment checked, pre-op evaluation and anesthesia consent Right, radial was placed Catheter size: 20 G Hand hygiene performed  and maximum sterile barriers used   Attempts: 1 Procedure performed without using ultrasound guided technique. Following insertion, dressing applied and Biopatch. Post procedure assessment: normal and unchanged  Patient tolerated the procedure well with no immediate complications.

## 2023-01-27 NOTE — Progress Notes (Signed)
PHARMACY - TOTAL PARENTERAL NUTRITION CONSULT NOTE   Indication: Intolerance to enteral feeding, Duodenal obstruction  Patient Measurements: Height:  (185.4 cm) Weight: 65.5 kg (144 lb 6.4 oz) IBW/kg (Calculated) : 79.9 TPN AdjBW (KG): 68.1 Body mass index is 19.05 kg/m.   Assessment: Pt is a 10 yoM admitted on 4/8 with N/V, chronic partial SBO, history of multiple previous abdominal surgeries. Pt has very minimal PO intake with ongoing weight loss. Pharmacy consulted to manage TPN.   Glucose / Insulin: No history of DM. CBGs well controlled on goal rate and CBGs/SSI d/c.  Glucose 99.  Electrolytes: Na improved to 136, others WNL Renal: SCr WNL/stable, BUN 36 Hepatic: ALT mildly elevated, AST, Tbili WNL, Albumin 2.6.  Trig 23 Intake / Output; MIVF: net I/O +128 mL - UOP: 2000 mL/24, NG output not recorded - LBM 4/21 -mIVF: none GI Imaging:  -4/4 UGI: Findings suggestive of chronic partial small bowel obstruction or ileus -4/9 CT Abd: Ileus vs high grade partial SBO GI Surgeries / Procedures:  -4/11 EGD: Gastric and small bowel dilation. Could have functional afferent loop syndrome or internal hernia - 4/14 emergent bedside EGD:  oozing cratered gastric ulcer w/ oozing hemorrhage> inj w/ epi & clips placed - 4/15: IR for emergency embolization - 4/22 planning for ex lap  Central access: Double lumen PICC placed 4/8 TPN start date: 4/9  Nutritional Goals:  Goal TPN rate is 85 mL/hr (provides 112 g of protein and 2101 kcals per day)  RD Assessment: Estimated Needs Total Energy Estimated Needs: 2050-2250 Total Protein Estimated Needs: 100-115g Total Fluid Estimated Needs: 2.1L/day  Current Nutrition:  NPO  TPN  Plan:  At 1800: Continue TPN @ goal rate of 85 mL/hr  Electrolytes in TPN: Na 154 mEq/L, K 30 mEq/L, Ca 10 mEq/L, Mg 10 mEq/L, and Phos 15 mmol/L [id number] 2:1 Add standard MVI and trace elements to TPN Completed 5 days thiamine 4/13 D/c CBG/SSI mIVF  per MD Monitor TPN labs on Mon/Thurs and PRN   Lynann Beaver PharmD, BCPS [facility] main pharmacy [phone] 01/27/2023 7:26 AM

## 2023-01-27 NOTE — Progress Notes (Signed)
PROGRESS NOTE    Bradley Dominguez  RUE:[id number] DOB: [birth year] DOA: 01/13/2023 PCP: Gaspar Garbe, MD     Brief Narrative:  Bradley Dominguez is a 67 yo M with past medical history significant for chronic partial small bowel obstruction and multiple previous abd surgeries  who was admitted to [facility] 4/8 from home w n/v/bloating. GI consult 4/10, underwent EGD 4/11 -- concerning for duodenal obstruction. Planned for 4/15 ex lap LOA duodenojejunal bypass.    On 4/14 started vomiting blood, dark red and clots noted. BP on lower end but baseline BP with systolics in 80's to low 90's so not far. Repeat hgb in 8's from 9. Developed hemorrhagic shock 4/15 in the setting of hematemesis / hematochezia, decompensated requiring MTP, intubation, TXA and embolization of jejunal arcade branch. PCCM consulted for ICU care.  4/8: Admit to general surgery team 4/11: EGD > duodenal obstruction 4/14: Hematemesis, hypotensive.  PCCM consulted 4/15: Significant hematemesis, hematochezia, hemorrhagic shock.  Intubated.  Status post IR for embolization of jejunal arcade branch, MTP required, TXA 4/17: Extubated 4/20: Transferred from [facility] to [facility] service.  General surgery remains primary service.  4/22: OR for ex lap   New events last 24 hours / Subjective: Son is at bedside.  No new complaints today.  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 Required MTP protocol, IR for embolization -Now off vasopressors -Shock resolved.  Hemoglobin stable this morning  Acute respiratory failure with hypoxia -Extubated.   Stable  Duodenal obstruction -Planned for ex lap 4/22 -Per primary team -TPN, NG tube, NPO     In agreement with assessment of the pressure ulcer as below:  Pressure Injury 01/24/23 Vertebral column Upper Stage 1 -  Intact skin with non-blanchable redness of a localized area usually over a bony prominence. 4 cm x 2 cm (Active)  01/24/23 1000  Location: Vertebral column  Location Orientation: Upper  Staging: Stage 1 -  Intact skin with non-blanchable redness of a localized area usually over a bony prominence.  Wound Description (Comments): 4 cm x 2 cm  Present on Admission: No  Dressing Type Foam - Lift dressing to assess site every shift 01/26/23 1600     Nutrition Problem: Severe Malnutrition Etiology: chronic illness   Antimicrobials:  Anti-infectives (From admission, onward)    Start     Dose/Rate Route Frequency Ordered Stop   01/27/23 1030  ceFAZolin (ANCEF) IVPB 2g/100 mL premix       See Hyperspace for full Linked Orders Report.   2 g 200 mL/hr over 30 Minutes Intravenous On call to O.R. 01/27/23 0746 01/28/23 0559   01/27/23 1030  metroNIDAZOLE (FLAGYL) IVPB 500 mg       See Hyperspace for full Linked Orders Report.   500 mg 100 mL/hr over 60 Minutes Intravenous On call to O.R. 01/27/23 0746 01/28/23 0559   01/19/23 0600  cefTRIAXone (ROCEPHIN) 2 g in sodium chloride 0.9 % 100 mL IVPB       See Hyperspace for full Linked Orders Report.   2 g 200 mL/hr over 30 Minutes Intravenous On call to O.R. 01/19/23 1478 01/19/23 2956  01/19/23 0350  metroNIDAZOLE (FLAGYL) IVPB 500 mg  Status:  Discontinued       See Hyperspace for full Linked Orders Report.   500 mg 100 mL/hr over 60 Minutes Intravenous Every 8 hours 01/19/23 0350 01/19/23 0752        Objective: Vitals:   01/27/23 0600 01/27/23 0800 01/27/23 0937 01/27/23 0941  BP: (!) 99/52   114/70  Pulse: 71   81  Resp: 15   15  Temp:  97.8 F (36.6 C)  97.7 F (36.5 C)  TempSrc:  Oral  Oral  SpO2: 96%    98%  Weight:   65.5 kg   Height:   6\' 1"  (1.854 m)     Intake/Output Summary (Last 24 hours) at 01/27/2023 1027 Last data filed at 01/27/2023 0630 Gross per 24 hour  Intake 1670.7 ml  Output 1850 ml  Net -179.3 ml    Filed Weights   01/26/23 0500 01/27/23 0500 01/27/23 0937  Weight: 65.3 kg 65.5 kg 65.5 kg    Examination:  General exam: Appears calm and comfortable  Respiratory system: Clear to auscultation. Respiratory effort normal. Cardiovascular system: S1 & S2 heard, RRR. No murmurs. No pedal edema. Gastrointestinal system: Abdomen is nondistended, NG tube in place Central nervous system: Alert and oriented. No focal neurological deficits. Speech clear.  Extremities: Symmetric in appearance  Skin: No rashes, lesions or ulcers on exposed skin  Psychiatry: Judgement and insight appear normal. Mood & affect appropriate.   Data Reviewed: I have personally reviewed following labs and imaging studies  CBC: Recent Labs  Lab 01/22/23 0515 01/22/23 0555 01/23/23 0924 01/24/23 0506 01/25/23 0317 01/25/23 2107 01/26/23 0509 01/27/23 0255  WBC 6.1   < > 6.4 5.7 5.6  --  4.7 4.6  NEUTROABS 4.3  --   --  4.0  --   --   --   --   HGB 10.1*   < > 10.7* 10.8* 11.1* 11.4* 10.6* 10.4*  HCT 31.3*   < > 33.2* 34.0* 35.0* 35.6* 33.5* 32.8*  MCV 93.4   < > 94.9 95.2 96.4  --  96.8 96.8  PLT 74*   < > 82* 91* 102*  --  114* 134*   < > = values in this interval not displayed.    Basic Metabolic Panel: Recent Labs  Lab 01/20/23 1622 01/21/23 0630 01/23/23 1610 01/24/23 0506 01/25/23 0317 01/26/23 0503 01/27/23 0255  NA 134*   < > 137 136 135 134* 136  K 4.1   < > 4.2 4.3 4.5 4.3 4.4  CL 98   < > 101 103 102 104 103  CO2 31   < > 30 27 26 25 28   GLUCOSE 121*   < > 131* 117* 98 94 99  BUN 45*   < > 29* 27* 34* 34* 36*  CREATININE 0.84   < > 0.96 0.89 0.82 0.80 0.79  CALCIUM 6.8*   < > 7.9* 7.7* 8.0* 7.8* 8.0*  MG 2.5*   < > 1.4* 2.1 1.6* 1.7 2.0  PHOS 3.3  --  4.5 3.2  --   3.0 3.1   < > = values in this interval not displayed.    GFR: Estimated Creatinine Clearance: 84.1 mL/min (by C-G formula based on SCr of 0.79 mg/dL). Liver Function Tests: Recent Labs  Lab 01/20/23 1622 01/22/23 0515 01/23/23 0632 01/27/23 0255  AST 20 19 20  32  ALT 20 19 21  53*  ALKPHOS 32* 36* 41  53  BILITOT 1.0 0.6 0.8 0.5  PROT 3.9* 4.3* 4.8* 5.2*  ALBUMIN 2.2* 2.3* 2.4* 2.6*    No results for input(s): "LIPASE", "AMYLASE" in the last 168 hours. No results for input(s): "AMMONIA" in the last 168 hours. Coagulation Profile: Recent Labs  Lab 01/20/23 1130 01/20/23 1623 01/23/23 0330  INR 1.4* 1.2 1.1    Cardiac Enzymes: No results for input(s): "CKTOTAL", "CKMB", "CKMBINDEX", "TROPONINI" in the last 168 hours. BNP (last 3 results) No results for input(s): "PROBNP" in the last 8760 hours. HbA1C: No results for input(s): "HGBA1C" in the last 72 hours. CBG: Recent Labs  Lab 01/26/23 0001 01/26/23 0811 01/26/23 1618 01/27/23 0001 01/27/23 0818  GLUCAP 112* 97 90 109* 87    Lipid Profile: Recent Labs    01/27/23 0255  TRIG 23    Thyroid Function Tests: No results for input(s): "TSH", "T4TOTAL", "FREET4", "T3FREE", "THYROIDAB" in the last 72 hours. Anemia Panel: No results for input(s): "VITAMINB12", "FOLATE", "FERRITIN", "TIBC", "IRON", "RETICCTPCT" in the last 72 hours. Sepsis Labs: No results for input(s): "PROCALCITON", "LATICACIDVEN" in the last 168 hours.  No results found for this or any previous visit (from the past 240 hour(s)).    Radiology Studies: No results found.    Scheduled Meds:  [MAR Hold] Chlorhexidine Gluconate Cloth  6 each Topical Daily   [MAR Hold] pantoprazole  40 mg Intravenous Q12H   [MAR Hold] sodium chloride flush  10-40 mL Intracatheter Q12H   Continuous Infusions:  sodium chloride 250 mL (01/26/23 0847)    ceFAZolin (ANCEF) IV     And   metronidazole     TPN ADULT (ION) 85 mL/hr at 01/27/23 0545   TPN ADULT  (ION)       LOS: 14 days   Time spent: 25 minutes   Noralee Stain, DO [facility] 01/27/2023, 10:27 AM   Available via Epic secure chat 7am-7pm After these hours, please refer to coverage provider listed on [email or web address]

## 2023-01-27 NOTE — Progress Notes (Signed)
Patient has had worsening acidosis and hypotension since arriving to ICU postop. Initially on 20 levo and epi, now on max doses of levo, vaso and epi. Hgb has declined to 8.4 from 9.2 in spite of 6u PRBCs. Has not received 1:1 ratio of FFP/platelets/cryo yet. Given acidosis and coagulapathy, will continue further resuscitation. Do not feel that return to OR at this time would beneficial in the setting of coagulopathy and acidosis. Anticipate patient will improve with ongoing resuscitation. Given significant transfusion requirement and critical care needs, will transfer to a surgical ICU at [facility].  Sophronia Simas, MD [facility] Surgery General, Hepatobiliary and Pancreatic Surgery 01/27/23 9:50 PM

## 2023-01-27 NOTE — Anesthesia Procedure Notes (Signed)
Arterial Line Insertion Start/End4/22/2024 2:05 PM, 01/27/2023 2:11 PM Performed by: Beryle Lathe, MD, anesthesiologist  Patient location: Pre-op. Preanesthetic checklist: patient identified, IV checked, risks and benefits discussed, surgical consent, monitors and equipment checked, pre-op evaluation, timeout performed and anesthesia consent Patient sedated Left, radial was placed Catheter size: 20 G Hand hygiene performed   Attempts: 2 (First attempt without [place] unsuccessful) Procedure performed using ultrasound guided technique. Ultrasound Notes:anatomy identified, needle tip was noted to be adjacent to the nerve/plexus identified, no ultrasound evidence of intravascular and/or intraneural injection and image(s) printed for medical record Following insertion, dressing applied and Biopatch. Post procedure assessment: unchanged and normal  Patient tolerated the procedure well with no immediate complications.

## 2023-01-27 NOTE — Progress Notes (Signed)
PT Cancellation Note  Patient Details Name: Bradley Dominguez MRN: [id number] DOB: Apr 04, 1956   Cancelled Treatment:    Reason Eval/Treat Not Completed: Patient at procedure or test/unavailable. Will check back tomorrow. Blanchard Kelch PT Acute Rehabilitation Services Office [phone] Weekend [phone]    [name] 01/27/2023, 10:02 AM

## 2023-01-27 NOTE — Op Note (Signed)
Date: 01/27/23  Patient: Bradley Dominguez MRN: [id number]  Preoperative Diagnosis: Duodenal obstruction Postoperative Diagnosis: Same  Procedure:  Exploratory laparotomy with lysis of adhesions (2-hour adhesiolysis) Duodenojejunal bypass Placement of 10-Fr T-tube in the common bile duct Placement of duodenostomy tube (22-Fr malecot)  Surgeon: Sophronia Simas, MD Assistant: Feliciana Rossetti, MD  EBL: 1500 mL  Anesthesia: General endotracheal  Specimens: None  Indications: Bradley Dominguez is a 67 yo male with a remote history of multiple prior open abdominal operations, most recently in the 1970s, who has had weight loss over the last few months secondary to progressive early satiety, bloating and oral intolerance. An outpatient upper GI showed a dilated duodenum with distal obstruction. He was admitted and started on TPN, and endoscopy confirmed severe stenosis vs complete occlusion of D3/D4. He then developed a massive GI bleed, which required emergent embolization of a jejunal arcade pseudoaneurysm in IR. He has since improved significantly. After an extensive discussion of the risks and benefits of surgery, he consented to proceed with operative exploration and planned bypass to address his duodenal obstruction.  Findings: Extensive adhesions throughout the entire abdomen. Dilation of the proximal duodenum, and diffuse dilation of the small bowel and colon. The colon appeared to be filled with old blood from recent GI bleed. The duodenum was densely adherent to the gallbladder fossa and was adherent to the common bile duct, leading to a small injury to the anterior common bile duct, which was closed over a T-tube. There was also a duodenal bulb injury - this tissue was very thin and friable, and leaked in spite of repair, thus a 22-Fr malecot was placed through the injury. A 19-Fr JP drain was left adjacent to the duodenostomy and the T-tube.  Procedure details: Informed consent was obtained in the  preoperative area prior to the procedure. The patient was brought to the operating room and placed on the table in the supine position. General anesthesia was induced and appropriate lines and drains were placed for intraoperative monitoring. Perioperative antibiotics were administered per SCIP guidelines. The abdomen was prepped and draped in the usual sterile fashion. A pre-procedure timeout was taken verifying patient identity, surgical site and procedure to be performed.  A midline skin incision was made through the previous laparotomy scar and the subcutaneous tissue was sharply divided to expose the fascia. The fascia was grasped and elevated and opened sharply in the upper abdomen. There were adhesions to the abdominal wall. These were taken down using gentle blunt and sharp dissection. The incision was then extended inferiorly. The fascia was gradually opened, clearing the midline of adhesions along the way with sharp dissection. Once the midline was clear, the right side of the wound was elevated and adhesions were cleared from the anterior abdominal wall using sharp dissection. During this dissection an enterotomy was made on the small bowel, which was oversewn in 2 layers with 3-0 silk sutures. Once the abdominal wall was cleared of adhesions, a Bookwalter fixed retractor was placed. Interloop adhesions within the bowel were then lysed sharply. This was a tedious dissection as the adhesions were extensive and involved the entire small bowel and colon. A total of 2 hours of adhesiolysis was performed. An enterotomy was made on the proximal small bowel, which was oversewn in two layers with an inner layer of 3-0 Vicryl sutures and an outer layer of 3-0 silk Lembert sutures. There were multiple serosal tears, which were oversewn with 3-0 silk Lembert sutures. The hepatic flexure was very adherent in the  RUQ, and was mobilized using blunt and sharp dissection. There was a long serosal tear on the transverse  colon, which was oversewn with 3-0 silk Lembert sutures. Once the small bowel and colon had been fully mobilized, the small bowel was run starting at the terminal ileum. There was a stapled anastomosis present in the distal small bowel. More proximally, a jejunojejunal anastomosis was identified. A retrocolic roux limb was identified leading to the gastrojejunal anastomosis. None of the stomach had been resected and the pylorus was in tact. The ligament of Treitz was identified. The proximal duodenum was very dilated and was adherent to the gallbladder fossa. The duodenum was sharply dissected off the gallbladder fossa using sharp dissection. This was tedious as the tissue was very friable and densely adherent to the liver. Two duodenotomies were made. During this dissection, drainage of pure bile was noted from a tubular structure. Probing this structure confirmed it entered the liver, and that this was the common bile duct, with a small injury on the anterior common bile duct. This was left in place, and I planned to repair this over a T tube. At this point the proximal duodenum remained partially adherent to the liver, and I felt that given the friability of the tissue, attempts to fully mobilize the duodenum would only result in further injuries. Enough duodenum had been mobilized to create a long anastomosis on the lateral aspect of D2. ***  All counts were correct x2 at the end of the procedure. The patient remained intubated and was transported directly to the ICU for further care at the completion of the procedure.  Sophronia Simas, MD 01/27/23 5:30 PM

## 2023-01-27 NOTE — Progress Notes (Addendum)
Cross covering ICU physician.   Called to assess pt post operatively. >200 out of drains that were requested not to be taped. Unclear where they were initially. Pt is on increasing pressors.   Obtaining new labs from 2 hours ago to allow for resuscitation.   Page out to surgery on call. Awaiting call back  Update 1954:  Spoke with Dr Luisa Hart states agree with central line. Family at bedside andupdated about the increasing support required via vasopressors and transfusions. Ordered report labs pending.  Awaiting further surgical recs.   2032:  Dr Luisa Hart at bedside updated about increase pressor requirement and increased drain output.  Cvc placed and cxr pending before placing infusion.  Hgb dropping since post op as well 9->8. Continued infusions and more labs pending.   Pt was on 30 norepi post op now on 10 of epi, 65 norepi and 0.06 vaso per ccm orders.   Awaiting surgery response  2055: dr Freida Busman at bedside inquired why cordis was not placed for MTP. I explained that triple lumen was what was on hand and pt has hgb of 8.4, platelets >100. I was the requested to step away from patient care. I inquired if she would be present for his care thru the night and I was again told I was not allowed to participate in this patients care any further.

## 2023-01-27 NOTE — Progress Notes (Addendum)
eLink Physician-Brief Progress Note Patient Name: Shady Bradish DOB: 06-Sep-1956 MRN: [id number]   Date of Service  01/27/2023  HPI/Events of Note  67 year old man that presented to the hospital with suspected small bowel obstruction that had a course complicated by perioperative bleeding and was returned to the ICU earlier today on multiple pressors intubated.  In the setting of his hemorrhagic shock, anticipating that the patient will receive transfusion with 3 additional units FFP, has received 6 units of product.  Currently on epi, nor epi, and vaso.  Currently receiving a LR infusion and underwent to ampoules of bicarb push.  Anticipating that the patient will transfer to [facility] ICU.  eICU Interventions  Change calcium replacement to 2 g.  Suspect that patient has pseudohyponatremia in the setting of hyperglycemia.  Will continue to monitor for now.  Surgery putting in a insulin drip.  Lactate is 6.1, likely secondary to under resuscitation, receiving fluids.  Mild hypercapnia on ABG, increased ventilatory rate to 25   2315 -norepinephrine used up to 70 mcg, order updated to reflect that.  Receiving FFP through epic transfusing now.  Anticipate VBG after volume resuscitation. 0200 -  notified about large-volume bleeding from all surgical sites.  DIC panel is pending.  Surgical team is aware.  8657 -patient is having significant hypotension with Precedex and fentanyl.  Anticipating transfer to [facility] imminently.  Will push Versed once and initiate a Versed drip.  0341 -persistently hypotensive.  Has been receiving essentially continuous colloid resuscitation which seems to be sustaining his pressures.  In reverse trendelenburg, diminished breath sounds in the left with increase Peak pressures.  Calcium is low at 6.1.  Surgery managing resuscitation.  Currently on norepinephrine, epinephrine, vasopressin.  Ordered 2 g of calcium push.  Chest radiograph ordered.  Intervention  Category Major Interventions: Acid-Base disturbance - evaluation and management  Danni Leabo 01/27/2023, 10:06 PM

## 2023-01-27 NOTE — Anesthesia Procedure Notes (Addendum)
Procedure Name: Intubation Date/Time: 01/27/2023 10:59 AM  Performed by: Pearson Grippe, CRNAPre-anesthesia Checklist: Patient identified, Emergency Drugs available, Suction available and Patient being monitored Patient Re-evaluated:Patient Re-evaluated prior to induction Oxygen Delivery Method: Circle system utilized Preoxygenation: Pre-oxygenation with 100% oxygen Induction Type: IV induction, Rapid sequence and Cricoid Pressure applied Laryngoscope Size: Miller and 2 Grade View: Grade I Tube type: Oral Tube size: 7.5 mm Number of attempts: 1 Airway Equipment and Method: Stylet Placement Confirmation: ETT inserted through vocal cords under direct vision, positive ETCO2 and breath sounds checked- equal and bilateral Secured at: 22 cm Tube secured with: Tape Dental Injury: Teeth and Oropharynx as per pre-operative assessment

## 2023-01-27 NOTE — Progress Notes (Signed)
Day of Surgery  Subjective: Remains stable. Hgb stable at 10, no further bleeding.   Objective: Vital signs in last 24 hours: Temp:  [97.7 F (36.5 C)-98 F (36.7 C)] 97.7 F (36.5 C) (04/22 0941) Pulse Rate:  [64-82] 81 (04/22 0941) Resp:  [10-18] 15 (04/22 0941) BP: (83-114)/(41-70) 114/70 (04/22 0941) SpO2:  [87 %-100 %] 98 % (04/22 0941) Weight:  [65.5 kg] 65.5 kg (04/22 0937) Last BM Date : 01/25/23  Intake/Output from previous day: 04/21 0701 - 04/22 0700 In: 1924.7 [I.V.:1924.7] Out: 2000 [Urine:2000] Intake/Output this shift: No intake/output data recorded.  PE: General: alert and oriented Neuro: no focal deficits HEENT: NG tube with bilious, non-bloody drainage. Resp: normal work of breathing Abdomen: soft, nondistended, nontender to palpation Extremities: warm and well-perfused    Lab Results:  Recent Labs    01/26/23 0509 01/27/23 0255  WBC 4.7 4.6  HGB 10.6* 10.4*  HCT 33.5* 32.8*  PLT 114* 134*    BMET Recent Labs    01/26/23 0503 01/27/23 0255  NA 134* 136  K 4.3 4.4  CL 104 103  CO2 25 28  GLUCOSE 94 99  BUN 34* 36*  CREATININE 0.80 0.79  CALCIUM 7.8* 8.0*   PT/INR No results for input(s): "LABPROT", "INR" in the last 72 hours.  CMP     Component Value Date/Time   NA 136 01/27/2023 0255   K 4.4 01/27/2023 0255   CL 103 01/27/2023 0255   CO2 28 01/27/2023 0255   GLUCOSE 99 01/27/2023 0255   BUN 36 (H) 01/27/2023 0255   CREATININE 0.79 01/27/2023 0255   CALCIUM 8.0 (L) 01/27/2023 0255   PROT 5.2 (L) 01/27/2023 0255   ALBUMIN 2.6 (L) 01/27/2023 0255   AST 32 01/27/2023 0255   ALT 53 (H) 01/27/2023 0255   ALKPHOS 53 01/27/2023 0255   BILITOT 0.5 01/27/2023 0255   GFRNONAA >60 01/27/2023 0255   GFRAA >60 09/23/2017 0507   Lipase  No results found for: "LIPASE"     Studies/Results: No results found.  Anti-infectives: Anti-infectives (From admission, onward)    Start     Dose/Rate Route Frequency Ordered Stop    01/27/23 1030  ceFAZolin (ANCEF) IVPB 2g/100 mL premix       See Hyperspace for full Linked Orders Report.   2 g 200 mL/hr over 30 Minutes Intravenous On call to O.R. 01/27/23 0746 01/28/23 0559   01/27/23 1030  metroNIDAZOLE (FLAGYL) IVPB 500 mg       See Hyperspace for full Linked Orders Report.   500 mg 100 mL/hr over 60 Minutes Intravenous On call to O.R. 01/27/23 0746 01/28/23 0559   01/19/23 0600  cefTRIAXone (ROCEPHIN) 2 g in sodium chloride 0.9 % 100 mL IVPB       See Hyperspace for full Linked Orders Report.   2 g 200 mL/hr over 30 Minutes Intravenous On call to O.R. 01/19/23 0350 01/19/23 0606   01/19/23 0350  metroNIDAZOLE (FLAGYL) IVPB 500 mg  Status:  Discontinued       See Hyperspace for full Linked Orders Report.   500 mg 100 mL/hr over 60 Minutes Intravenous Every 8 hours 01/19/23 0350 01/19/23 0752        Assessment/Plan 67 yo male with chronic intermittent bloating, presenting with worsening symptoms, PO intolerance and dehydration. 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 - IR embolization performed 4/15. Received massive transfusion. Now off pressors. Hgb stable at  10 for several days, no signs of ongoing bleeding. - Duodenal obstruction: OR today for exploratory laparotomy, adhesiolysis, duodenojejunal bypass, and possible feeding tube placement. I reviewed the planned procedure details with the patient and his wife, and discussed the benefits and risks. He expressed understanding and agrees to proceed with surgery. - Continue full-strength TPN - VTE: SCDs, begin lovenox postoperatively - Dispo: inpatient, step-down status    LOS: 14 days    Bradley Simas, MD [facility] Surgery General, Hepatobiliary and Pancreatic Surgery 01/27/23 10:17 AM

## 2023-01-27 NOTE — Anesthesia Preprocedure Evaluation (Addendum)
Anesthesia Evaluation  Patient identified by MRN, date of birth, ID band Patient awake    Reviewed: Allergy & Precautions, NPO status , Patient's Chart, lab work & pertinent test results  History of Anesthesia Complications Negative for: history of anesthetic complications  Airway Mallampati: II  TM Distance: >3 FB Neck ROM: Full    Dental  (+) Dental Advisory Given   Pulmonary neg pulmonary ROS   breath sounds clear to auscultation       Cardiovascular (-) angina  Rhythm:Regular Rate:Normal  '20 ECHO :  Left ventricle cavity is normal in size. Normal global wall motion. Normal  diastolic filling pattern. Calculated EF 55%.  Trileaflet aortic valve with trace regurgitation.  Trace to mild Mild (Grade I) mitral regurgitation.  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 Required MTP protocol, IR for embolization -Now off vasopressors   Reproductive/Obstetrics                             Anesthesia Physical Anesthesia Plan  ASA: 3  Anesthesia Plan: General   Post-op Pain Management: Ofirmev IV (intra-op)*   Induction: Intravenous and Rapid sequence  PONV Risk Score and Plan: 2 and Ondansetron and Dexamethasone  Airway Management Planned: Oral ETT  Additional Equipment: None  Intra-op Plan:   Post-operative Plan: Extubation in OR  Informed Consent: I have reviewed the patients History and Physical, chart,  labs and discussed the procedure including the risks, benefits and alternatives for the proposed anesthesia with the patient or authorized representative who has indicated his/her understanding and acceptance.     Dental advisory given and Consent reviewed with POA  Plan Discussed with: CRNA and Surgeon  Anesthesia Plan Comments: (Lengthy discussion with patient and his wife, they understand and accept that pt will receive fentanyl as part of his anesthetic management.  All questions answered)       Anesthesia Quick Evaluation

## 2023-01-27 NOTE — Procedures (Signed)
Central Venous Catheter Insertion Procedure Note  [employer]  [id number]  [birth year]  Date:01/27/23  Time:8:30 PM   Provider Performing:Elchanan Bob Gaynell Face   Procedure: Insertion of Non-tunneled Central Venous [phone]) with US guidance ([id number])   Indication(s) Medication administration  Consent Risks of the procedure as well as the alternatives and risks of each were explained to the patient and/or caregiver.  Consent for the procedure was obtained and is signed in the bedside chart  Anesthesia See mar  Timeout Verified patient identification, verified procedure, site/side was marked, verified correct patient position, special equipment/implants available, medications/allergies/relevant history reviewed, required imaging and test results available.  Sterile Technique Maximal sterile technique including full sterile barrier drape, hand hygiene, sterile gown, sterile gloves, mask, hair covering, sterile ultrasound probe cover (if used).  Procedure Description Area of catheter insertion was cleaned with chlorhexidine and draped in sterile fashion.  With real-time ultrasound guidance a central venous catheter was placed into the left internal jugular vein. Nonpulsatile blood flow and easy flushing noted in all ports.  The catheter was sutured in place and sterile dressing applied.  Complications/Tolerance None; patient tolerated the procedure well. Chest X-ray is ordered to verify placement for internal jugular or subclavian cannulation.   Chest x-ray is not ordered for femoral cannulation.  EBL Minimal  Specimen(s) None

## 2023-01-27 NOTE — Procedures (Signed)
Central line  Date/Time: 01/27/2023 9:44 PM  Performed by: Fritzi Mandes, MD Authorized by: Fritzi Mandes, MD   Consent:    Consent obtained:  Emergent situation Pre-procedure details:    Indication(s): central venous access     Skin preparation agent: Skin preparation agent completely dried prior to procedure   Procedure details:    Location:  L internal jugular   Patient position:  Trendelenburg   Procedural supplies:  Cordis   Catheter size:  10 Fr   Landmarks identified: yes     Ultrasound guidance: yes     Ultrasound guidance timing: real time     Sterile ultrasound techniques: Sterile gel and sterile probe covers were used     Number of attempts:  1   Successful placement: yes   Post-procedure details:    Post-procedure:  Dressing applied and line sutured   Assessment:  Blood return through all ports, no pneumothorax on x-ray, free fluid flow and placement verified by x-ray   Procedure completion:  Tolerated  Previous triple lumen catheter coiled. Catheter was exchanged over a wire to a 10-Fr cordis to be used for rapid infuser. US-guidance used to confirm wire position in vein prior to dilation.  Sophronia Simas, MD [facility] Surgery General, Hepatobiliary and Pancreatic Surgery 01/27/23 9:47 PM

## 2023-01-27 NOTE — Transfer of Care (Signed)
Immediate Anesthesia Transfer of Care Note  Patient: Bradley Dominguez  Procedure(s) Performed: EXPLORATORY LAPAROTOMY, LYSIS OF ADHESIONS, DUODENOJEJUNAL BYPASS, PLACEMENT OF BILIARY T-TUBE, PLACEMENT OF DUODENOSTOMY TUBE (Abdomen)  Patient Location: ICU  Anesthesia Type:General  Level of Consciousness: Patient remains intubated per anesthesia plan  Airway & Oxygen Therapy: Patient remains intubated per anesthesia plan and Patient placed on Ventilator (see vital sign flow sheet for setting)  Post-op Assessment: Report given to RN and Post -op Vital signs reviewed and stable  Post vital signs: Reviewed and stable  Last Vitals:  Vitals Value Taken Time  BP 99/62   Temp    Pulse 140   Resp 14   SpO2 100%     Last Pain:  Vitals:   01/27/23 0941  TempSrc: Oral  PainSc:          Complications: No notable events documented.

## 2023-01-28 ENCOUNTER — Inpatient Hospital Stay (HOSPITAL_COMMUNITY): Payer: BC Managed Care – PPO | Admitting: Certified Registered Nurse Anesthetist

## 2023-01-28 ENCOUNTER — Other Ambulatory Visit: Payer: Self-pay

## 2023-01-28 ENCOUNTER — Encounter (HOSPITAL_COMMUNITY): Admission: AD | Disposition: E | Payer: Self-pay | Source: Ambulatory Visit

## 2023-01-28 ENCOUNTER — Inpatient Hospital Stay (HOSPITAL_COMMUNITY): Payer: BC Managed Care – PPO

## 2023-01-28 ENCOUNTER — Encounter (HOSPITAL_COMMUNITY): Payer: Self-pay | Admitting: Surgery

## 2023-01-28 ENCOUNTER — Inpatient Hospital Stay (HOSPITAL_COMMUNITY): Payer: BC Managed Care – PPO | Admitting: Certified Registered"

## 2023-01-28 DIAGNOSIS — R7401 Elevation of levels of liver transaminase levels: Secondary | ICD-10-CM | POA: Diagnosis not present

## 2023-01-28 DIAGNOSIS — N179 Acute kidney failure, unspecified: Secondary | ICD-10-CM | POA: Diagnosis not present

## 2023-01-28 DIAGNOSIS — T8119XA Other postprocedural shock, initial encounter: Secondary | ICD-10-CM | POA: Diagnosis not present

## 2023-01-28 DIAGNOSIS — K551 Chronic vascular disorders of intestine: Secondary | ICD-10-CM | POA: Diagnosis not present

## 2023-01-28 DIAGNOSIS — R578 Other shock: Secondary | ICD-10-CM | POA: Diagnosis not present

## 2023-01-28 HISTORY — PX: LAPAROTOMY: SHX154

## 2023-01-28 LAB — BPAM FFP
Blood Product Expiration Date: 202404282359
Blood Product Expiration Date: 202404282359
Blood Product Expiration Date: 202404282359
Blood Product Expiration Date: 202404282359
Blood Product Expiration Date: 202404282359
Blood Product Expiration Date: 202404272359
Blood Product Expiration Date: 202405052359
ISSUE DATE / TIME: 202404230102
ISSUE DATE / TIME: 202404230248
ISSUE DATE / TIME: 202404230332
ISSUE DATE / TIME: 202404230407
ISSUE DATE / TIME: 202404230503
ISSUE DATE / TIME: 202404230517
ISSUE DATE / TIME: 202404230517
ISSUE DATE / TIME: 202404230534
Unit Type and Rh: 5100
Unit Type and Rh: 5100
Unit Type and Rh: 5100
Unit Type and Rh: 5100
Unit Type and Rh: 5100
Unit Type and Rh: 5100
Unit Type and Rh: 5100
Unit Type and Rh: 6200

## 2023-01-28 LAB — PREPARE FRESH FROZEN PLASMA
Unit division: 0
Unit division: 0
Unit division: 0

## 2023-01-28 LAB — TYPE AND SCREEN
Antibody Screen: NEGATIVE
Unit division: 0
Unit division: 0
Unit division: 0
Unit division: 0
Unit division: 0
Unit division: 0
Unit division: 0
Unit division: 0
Unit division: 0
Unit division: 0

## 2023-01-28 LAB — CBC
HCT: 18.1 % — ABNORMAL LOW (ref 39.0–52.0)
HCT: 37.4 % — ABNORMAL LOW (ref 39.0–52.0)
HCT: 45.8 % (ref 39.0–52.0)
HCT: 45.2 % (ref 39.0–52.0)
HCT: 45.4 % (ref 39.0–52.0)
Hemoglobin: 6 g/dL — CL (ref 13.0–17.0)
Hemoglobin: 12.1 g/dL — ABNORMAL LOW (ref 13.0–17.0)
Hemoglobin: 16.2 g/dL (ref 13.0–17.0)
Hemoglobin: 15.7 g/dL (ref 13.0–17.0)
Hemoglobin: 16.5 g/dL (ref 13.0–17.0)
MCH: 29.9 pg (ref 26.0–34.0)
MCH: 28.7 pg (ref 26.0–34.0)
MCH: 29.3 pg (ref 26.0–34.0)
MCH: 29.2 pg (ref 26.0–34.0)
MCH: 29.9 pg (ref 26.0–34.0)
MCHC: 33.1 g/dL (ref 30.0–36.0)
MCHC: 32.4 g/dL (ref 30.0–36.0)
MCHC: 35.4 g/dL (ref 30.0–36.0)
MCHC: 34.7 g/dL (ref 30.0–36.0)
MCHC: 36.3 g/dL — ABNORMAL HIGH (ref 30.0–36.0)
MCV: 90 fL (ref 80.0–100.0)
MCV: 88.8 fL (ref 80.0–100.0)
MCV: 83 fL (ref 80.0–100.0)
MCV: 84.2 fL (ref 80.0–100.0)
MCV: 82.4 fL (ref 80.0–100.0)
Platelets: 101 10*3/uL — ABNORMAL LOW (ref 150–400)
Platelets: 44 10*3/uL — ABNORMAL LOW (ref 150–400)
Platelets: 35 10*3/uL — ABNORMAL LOW (ref 150–400)
Platelets: 45 10*3/uL — ABNORMAL LOW (ref 150–400)
Platelets: 63 10*3/uL — ABNORMAL LOW (ref 150–400)
RBC: 2.01 MIL/uL — ABNORMAL LOW (ref 4.22–5.81)
RBC: 4.21 MIL/uL — ABNORMAL LOW (ref 4.22–5.81)
RBC: 5.52 MIL/uL (ref 4.22–5.81)
RBC: 5.37 MIL/uL (ref 4.22–5.81)
RBC: 5.51 MIL/uL (ref 4.22–5.81)
RDW: 16.1 % — ABNORMAL HIGH (ref 11.5–15.5)
RDW: 15.5 % (ref 11.5–15.5)
RDW: 15.9 % — ABNORMAL HIGH (ref 11.5–15.5)
RDW: 15.8 % — ABNORMAL HIGH (ref 11.5–15.5)
RDW: 16.7 % — ABNORMAL HIGH (ref 11.5–15.5)
WBC: 5.6 10*3/uL (ref 4.0–10.5)
WBC: 5 10*3/uL (ref 4.0–10.5)
WBC: 3.6 10*3/uL — ABNORMAL LOW (ref 4.0–10.5)
WBC: 4.3 10*3/uL (ref 4.0–10.5)
WBC: 5.6 10*3/uL (ref 4.0–10.5)
nRBC: 0 % (ref 0.0–0.2)
nRBC: 0.4 % — ABNORMAL HIGH (ref 0.0–0.2)
nRBC: 0.6 % — ABNORMAL HIGH (ref 0.0–0.2)
nRBC: 0.7 % — ABNORMAL HIGH (ref 0.0–0.2)
nRBC: 0.5 % — ABNORMAL HIGH (ref 0.0–0.2)

## 2023-01-28 LAB — BPAM RBC
Blood Product Expiration Date: 202405202359
Blood Product Expiration Date: 202405202359
Blood Product Expiration Date: 202405202359
Blood Product Expiration Date: 202405232359
Blood Product Expiration Date: 202405242359
Blood Product Expiration Date: 202405252359
Blood Product Expiration Date: 202405252359
Blood Product Expiration Date: 202405252359
Blood Product Expiration Date: 202405252359
Blood Product Expiration Date: 202405272359
Blood Product Expiration Date: 202405272359
Blood Product Expiration Date: 202405232359
ISSUE DATE / TIME: 202404230143
ISSUE DATE / TIME: 202404230246
ISSUE DATE / TIME: 202404230403
ISSUE DATE / TIME: 202404230431
ISSUE DATE / TIME: 202404230454
ISSUE DATE / TIME: 202404230649
Unit Type and Rh: 5100
Unit Type and Rh: 5100
Unit Type and Rh: 5100
Unit Type and Rh: 5100
Unit Type and Rh: 5100
Unit Type and Rh: 5100
Unit Type and Rh: 5100
Unit Type and Rh: 5100
Unit Type and Rh: 5100
Unit Type and Rh: 9500

## 2023-01-28 LAB — POCT I-STAT 7, (LYTES, BLD GAS, ICA,H+H)
Acid-base deficit: 12 mmol/L — ABNORMAL HIGH (ref 0.0–2.0)
Acid-base deficit: 8 mmol/L — ABNORMAL HIGH (ref 0.0–2.0)
Acid-base deficit: 3 mmol/L — ABNORMAL HIGH (ref 0.0–2.0)
Bicarbonate: 17.6 mmol/L — ABNORMAL LOW (ref 20.0–28.0)
Bicarbonate: 20.2 mmol/L (ref 20.0–28.0)
Bicarbonate: 22.2 mmol/L (ref 20.0–28.0)
Calcium, Ion: 0.65 mmol/L — CL (ref 1.15–1.40)
Calcium, Ion: 0.92 mmol/L — ABNORMAL LOW (ref 1.15–1.40)
Calcium, Ion: 1.22 mmol/L (ref 1.15–1.40)
HCT: 26 % — ABNORMAL LOW (ref 39.0–52.0)
HCT: 26 % — ABNORMAL LOW (ref 39.0–52.0)
HCT: 43 % (ref 39.0–52.0)
Hemoglobin: 8.8 g/dL — ABNORMAL LOW (ref 13.0–17.0)
Hemoglobin: 8.8 g/dL — ABNORMAL LOW (ref 13.0–17.0)
Hemoglobin: 14.6 g/dL (ref 13.0–17.0)
O2 Saturation: 100 %
O2 Saturation: 100 %
O2 Saturation: 99 %
Patient temperature: 99.2
Potassium: 4.1 mmol/L (ref 3.5–5.1)
Potassium: 4.2 mmol/L (ref 3.5–5.1)
Potassium: 3.3 mmol/L — ABNORMAL LOW (ref 3.5–5.1)
Sodium: 143 mmol/L (ref 135–145)
Sodium: 143 mmol/L (ref 135–145)
Sodium: 141 mmol/L (ref 135–145)
TCO2: 19 mmol/L — ABNORMAL LOW (ref 22–32)
TCO2: 22 mmol/L (ref 22–32)
TCO2: 23 mmol/L (ref 22–32)
pCO2 arterial: 53.1 mmHg — ABNORMAL HIGH (ref 32–48)
pCO2 arterial: 61.1 mmHg — ABNORMAL HIGH (ref 32–48)
pCO2 arterial: 41.2 mmHg (ref 32–48)
pH, Arterial: 7.067 — CL (ref 7.35–7.45)
pH, Arterial: 7.189 — CL (ref 7.35–7.45)
pH, Arterial: 7.34 — ABNORMAL LOW (ref 7.35–7.45)
pO2, Arterial: 476 mmHg — ABNORMAL HIGH (ref 83–108)
pO2, Arterial: 494 mmHg — ABNORMAL HIGH (ref 83–108)
pO2, Arterial: 155 mmHg — ABNORMAL HIGH (ref 83–108)

## 2023-01-28 LAB — MAGNESIUM: Magnesium: 1.2 mg/dL — ABNORMAL LOW (ref 1.7–2.4)

## 2023-01-28 LAB — GLOBAL TEG PANEL
CFF Max Amplitude: 14.8 mm — ABNORMAL LOW (ref 15–32)
CK with Heparinase (R): 6.7 min (ref 4.3–8.3)
Citrated Functional Fibrinogen: 270.1 mg/dL — ABNORMAL LOW (ref 278–581)
Citrated Kaolin (K): 2.1 min (ref 0.8–2.1)
Citrated Kaolin (MA): 47.7 mm — ABNORMAL LOW (ref 52–69)
Citrated Kaolin (R): 5.6 min (ref 4.6–9.1)
Citrated Kaolin Angle: 70 deg (ref 63–78)
Citrated Rapid TEG (MA): 43.4 mm — ABNORMAL LOW (ref 52–70)

## 2023-01-28 LAB — TRAUMA TEG PANEL
CFF Max Amplitude: 14.7 mm — ABNORMAL LOW (ref 15–32)
Citrated Kaolin (R): 6.6 min (ref 4.6–9.1)
Citrated Rapid TEG (MA): 42 mm — ABNORMAL LOW (ref 52–70)
Lysis at 30 Minutes: 0 % (ref 0.0–2.6)

## 2023-01-28 LAB — COMPREHENSIVE METABOLIC PANEL
ALT: 435 U/L — ABNORMAL HIGH (ref 0–44)
ALT: 659 U/L — ABNORMAL HIGH (ref 0–44)
AST: 426 U/L — ABNORMAL HIGH (ref 15–41)
AST: 763 U/L — ABNORMAL HIGH (ref 15–41)
Albumin: 1.6 g/dL — ABNORMAL LOW (ref 3.5–5.0)
Albumin: 1.9 g/dL — ABNORMAL LOW (ref 3.5–5.0)
Alkaline Phosphatase: 27 U/L — ABNORMAL LOW (ref 38–126)
Alkaline Phosphatase: 41 U/L (ref 38–126)
Anion gap: 20 — ABNORMAL HIGH (ref 5–15)
Anion gap: 11 (ref 5–15)
BUN: 33 mg/dL — ABNORMAL HIGH (ref 8–23)
BUN: 32 mg/dL — ABNORMAL HIGH (ref 8–23)
CO2: 17 mmol/L — ABNORMAL LOW (ref 22–32)
CO2: 21 mmol/L — ABNORMAL LOW (ref 22–32)
Calcium: 7.9 mg/dL — ABNORMAL LOW (ref 8.9–10.3)
Calcium: 7.7 mg/dL — ABNORMAL LOW (ref 8.9–10.3)
Chloride: 103 mmol/L (ref 98–111)
Chloride: 108 mmol/L (ref 98–111)
Creatinine, Ser: 1.38 mg/dL — ABNORMAL HIGH (ref 0.61–1.24)
Creatinine, Ser: 1.13 mg/dL (ref 0.61–1.24)
GFR, Estimated: 56 mL/min — ABNORMAL LOW (ref >60)
GFR, Estimated: >60 mL/min (ref >60)
Glucose, Bld: 382 mg/dL — ABNORMAL HIGH (ref 70–99)
Glucose, Bld: 111 mg/dL — ABNORMAL HIGH (ref 70–99)
Potassium: 3.3 mmol/L — ABNORMAL LOW (ref 3.5–5.1)
Potassium: 2.9 mmol/L — ABNORMAL LOW (ref 3.5–5.1)
Sodium: 140 mmol/L (ref 135–145)
Sodium: 140 mmol/L (ref 135–145)
Total Bilirubin: 0.8 mg/dL (ref 0.3–1.2)
Total Bilirubin: 1.4 mg/dL — ABNORMAL HIGH (ref 0.3–1.2)
Total Protein: <3 g/dL — ABNORMAL LOW (ref 6.5–8.1)
Total Protein: 3.5 g/dL — ABNORMAL LOW (ref 6.5–8.1)

## 2023-01-28 LAB — BASIC METABOLIC PANEL
Anion gap: 17 — ABNORMAL HIGH (ref 5–15)
Anion gap: 10 (ref 5–15)
BUN: 34 mg/dL — ABNORMAL HIGH (ref 8–23)
BUN: 34 mg/dL — ABNORMAL HIGH (ref 8–23)
CO2: 12 mmol/L — ABNORMAL LOW (ref 22–32)
CO2: 23 mmol/L (ref 22–32)
Calcium: 6.4 mg/dL — CL (ref 8.9–10.3)
Calcium: 7.6 mg/dL — ABNORMAL LOW (ref 8.9–10.3)
Chloride: 112 mmol/L — ABNORMAL HIGH (ref 98–111)
Chloride: 107 mmol/L (ref 98–111)
Creatinine, Ser: 1.22 mg/dL (ref 0.61–1.24)
Creatinine, Ser: 1.24 mg/dL (ref 0.61–1.24)
GFR, Estimated: >60 mL/min (ref >60)
GFR, Estimated: >60 mL/min (ref >60)
Glucose, Bld: 379 mg/dL — ABNORMAL HIGH (ref 70–99)
Glucose, Bld: 94 mg/dL (ref 70–99)
Potassium: 3.7 mmol/L (ref 3.5–5.1)
Potassium: 2.8 mmol/L — ABNORMAL LOW (ref 3.5–5.1)
Sodium: 141 mmol/L (ref 135–145)
Sodium: 140 mmol/L (ref 135–145)

## 2023-01-28 LAB — BLOOD GAS, ARTERIAL
Acid-base deficit: 12.1 mmol/L — ABNORMAL HIGH (ref 0.0–2.0)
Acid-base deficit: 2.7 mmol/L — ABNORMAL HIGH (ref 0.0–2.0)
Acid-base deficit: 5.9 mmol/L — ABNORMAL HIGH (ref 0.0–2.0)
Bicarbonate: 16.4 mmol/L — ABNORMAL LOW (ref 20.0–28.0)
Bicarbonate: 19.6 mmol/L — ABNORMAL LOW (ref 20.0–28.0)
Bicarbonate: 22.6 mmol/L (ref 20.0–28.0)
Drawn by: 54496
Drawn by: 54496
FIO2: 100 %
FIO2: 40 %
FIO2: 85 %
MECHVT: 630 mL
MECHVT: 630 mL
MECHVT: 630 mL
O2 Saturation: 100 %
O2 Saturation: 100 %
O2 Saturation: 100 %
PEEP: 5 cmH2O
PEEP: 5 cmH2O
PEEP: 5 cmH2O
Patient temperature: 36.3
Patient temperature: 36.5
Patient temperature: 37
RATE: 24 resp/min
RATE: 24 resp/min
RATE: 27 resp/min
pCO2 arterial: 38 mmHg (ref 32–48)
pCO2 arterial: 39 mmHg (ref 32–48)
pCO2 arterial: 45 mmHg (ref 32–48)
pH, Arterial: 7.17 — CL (ref 7.35–7.45)
pH, Arterial: 7.32 — ABNORMAL LOW (ref 7.35–7.45)
pH, Arterial: 7.37 (ref 7.35–7.45)
pO2, Arterial: 153 mmHg — ABNORMAL HIGH (ref 83–108)
pO2, Arterial: 227 mmHg — ABNORMAL HIGH (ref 83–108)
pO2, Arterial: 395 mmHg — ABNORMAL HIGH (ref 83–108)

## 2023-01-28 LAB — DIC (DISSEMINATED INTRAVASCULAR COAGULATION)PANEL
D-Dimer, Quant: 8.36 ug/mL-FEU — ABNORMAL HIGH (ref 0.00–0.50)
D-Dimer, Quant: 5.63 ug/mL-FEU — ABNORMAL HIGH (ref 0.00–0.50)
Fibrinogen: 178 mg/dL — ABNORMAL LOW (ref 210–475)
Fibrinogen: 168 mg/dL — ABNORMAL LOW (ref 210–475)
INR: 1.7 — ABNORMAL HIGH (ref 0.8–1.2)
INR: 1.7 — ABNORMAL HIGH (ref 0.8–1.2)
Platelets: 100 10*3/uL — ABNORMAL LOW (ref 150–400)
Platelets: 46 10*3/uL — ABNORMAL LOW (ref 150–400)
Prothrombin Time: 20 seconds — ABNORMAL HIGH (ref 11.4–15.2)
Prothrombin Time: 20.1 seconds — ABNORMAL HIGH (ref 11.4–15.2)
Smear Review: NONE SEEN
Smear Review: NONE SEEN
aPTT: 40 seconds — ABNORMAL HIGH (ref 24–36)
aPTT: 49 seconds — ABNORMAL HIGH (ref 24–36)

## 2023-01-28 LAB — GLUCOSE, CAPILLARY
Glucose-Capillary: 110 mg/dL — ABNORMAL HIGH (ref 70–99)
Glucose-Capillary: 181 mg/dL — ABNORMAL HIGH (ref 70–99)
Glucose-Capillary: 210 mg/dL — ABNORMAL HIGH (ref 70–99)
Glucose-Capillary: 274 mg/dL — ABNORMAL HIGH (ref 70–99)
Glucose-Capillary: 283 mg/dL — ABNORMAL HIGH (ref 70–99)
Glucose-Capillary: 306 mg/dL — ABNORMAL HIGH (ref 70–99)
Glucose-Capillary: 325 mg/dL — ABNORMAL HIGH (ref 70–99)
Glucose-Capillary: 333 mg/dL — ABNORMAL HIGH (ref 70–99)
Glucose-Capillary: 54 mg/dL — ABNORMAL LOW (ref 70–99)
Glucose-Capillary: 63 mg/dL — ABNORMAL LOW (ref 70–99)
Glucose-Capillary: 74 mg/dL (ref 70–99)
Glucose-Capillary: 94 mg/dL (ref 70–99)
Glucose-Capillary: 97 mg/dL (ref 70–99)

## 2023-01-28 LAB — PREPARE CRYOPRECIPITATE
Unit division: 0
Unit division: 0
Unit division: 0

## 2023-01-28 LAB — PREPARE PLATELET PHERESIS
Unit division: 0
Unit division: 0
Unit division: 0

## 2023-01-28 LAB — PREPARE RBC (CROSSMATCH)

## 2023-01-28 LAB — CBC WITH DIFFERENTIAL/PLATELET
Abs Immature Granulocytes: 0.04 10*3/uL (ref 0.00–0.07)
Basophils Absolute: 0 10*3/uL (ref 0.0–0.1)
Basophils Relative: 1 %
Eosinophils Absolute: 0 10*3/uL (ref 0.0–0.5)
Eosinophils Relative: 0 %
HCT: 40.7 % (ref 39.0–52.0)
Hemoglobin: 13.6 g/dL (ref 13.0–17.0)
Immature Granulocytes: 1 %
Lymphocytes Relative: 10 %
Lymphs Abs: 0.3 10*3/uL — ABNORMAL LOW (ref 0.7–4.0)
MCH: 29 pg (ref 26.0–34.0)
MCHC: 33.4 g/dL (ref 30.0–36.0)
MCV: 86.8 fL (ref 80.0–100.0)
Monocytes Absolute: 0.1 10*3/uL (ref 0.1–1.0)
Monocytes Relative: 3 %
Neutro Abs: 2.7 10*3/uL (ref 1.7–7.7)
Neutrophils Relative %: 85 %
Platelets: 33 10*3/uL — ABNORMAL LOW (ref 150–400)
RBC: 4.69 MIL/uL (ref 4.22–5.81)
RDW: 15.9 % — ABNORMAL HIGH (ref 11.5–15.5)
WBC: 3.2 10*3/uL — ABNORMAL LOW (ref 4.0–10.5)
nRBC: 0 % (ref 0.0–0.2)

## 2023-01-28 LAB — BPAM CRYOPRECIPITATE
Blood Product Expiration Date: 202404230051
Blood Product Expiration Date: 202404230723
Blood Product Expiration Date: 202404231055
ISSUE DATE / TIME: 202404222111
ISSUE DATE / TIME: 202404230500
ISSUE DATE / TIME: 202404230543
Unit Type and Rh: 6200
Unit Type and Rh: 6200

## 2023-01-28 LAB — BPAM PLATELET PHERESIS
Blood Product Expiration Date: 202404252359
Blood Product Expiration Date: 202404252359
Blood Product Expiration Date: 202404242359
ISSUE DATE / TIME: 202404221802
ISSUE DATE / TIME: 202404222307
ISSUE DATE / TIME: 202404231627
Unit Type and Rh: 6200
Unit Type and Rh: 7300
Unit Type and Rh: 6200
Unit Type and Rh: 6200
Unit Type and Rh: 6200

## 2023-01-28 LAB — PROTIME-INR
INR: 1.4 — ABNORMAL HIGH (ref 0.8–1.2)
Prothrombin Time: 16.9 seconds — ABNORMAL HIGH (ref 11.4–15.2)

## 2023-01-28 LAB — PHOSPHORUS: Phosphorus: 4 mg/dL (ref 2.5–4.6)

## 2023-01-28 LAB — MRSA NEXT GEN BY PCR, NASAL: MRSA by PCR Next Gen: NOT DETECTED

## 2023-01-28 LAB — MASSIVE TRANSFUSION PROTOCOL ORDER (BLOOD BANK NOTIFICATION)

## 2023-01-28 SURGERY — LAPAROTOMY, EXPLORATORY
Anesthesia: General | Site: Abdomen

## 2023-01-28 SURGERY — LAPAROTOMY, EXPLORATORY
Anesthesia: General

## 2023-01-28 MED ORDER — VASOPRESSIN 20 UNITS/100 ML INFUSION FOR SHOCK
0.0000 [IU]/min | INTRAVENOUS | Status: DC
  Administered 2023-01-29: 0.03 [IU]/min via INTRAVENOUS
  Filled 2023-01-28 (×2): qty 100

## 2023-01-28 MED ORDER — METHOCARBAMOL 1000 MG/10ML IJ SOLN
1000.0000 mg | Freq: Three times a day (TID) | INTRAVENOUS | Status: AC
  Administered 2023-01-28 (×2): 1000 mg via INTRAVENOUS
  Filled 2023-01-28: qty 10
  Filled 2023-01-28 (×2): qty 1000

## 2023-01-28 MED ORDER — FENTANYL 2500MCG IN NS 250ML (10MCG/ML) PREMIX INFUSION
INTRAVENOUS | Status: AC
  Administered 2023-01-28: 50 ug/h via INTRAVENOUS
  Filled 2023-01-28: qty 250

## 2023-01-28 MED ORDER — SODIUM CHLORIDE 0.9% IV SOLUTION: Freq: Once | INTRAVENOUS | Status: DC

## 2023-01-28 MED ORDER — POTASSIUM CHLORIDE 10 MEQ/50ML IV SOLN
INTRAVENOUS | Status: AC
  Filled 2023-01-28: qty 50

## 2023-01-28 MED ORDER — SODIUM CHLORIDE 0.9 % IV SOLN: INTRAVENOUS | Status: DC | PRN

## 2023-01-28 MED ORDER — FENTANYL CITRATE PF 50 MCG/ML IJ SOSY
PREFILLED_SYRINGE | INTRAMUSCULAR | Status: AC
  Administered 2023-01-28: 12.5 ug via INTRAVENOUS
  Filled 2023-01-28: qty 1

## 2023-01-28 MED ORDER — FENTANYL BOLUS VIA INFUSION
25.0000 ug | INTRAVENOUS | Status: DC | PRN
  Administered 2023-01-28: 25 ug via INTRAVENOUS
  Administered 2023-01-30: 50 ug via INTRAVENOUS
  Administered 2023-01-30 (×2): 25 ug via INTRAVENOUS
  Administered 2023-01-31: 50 ug via INTRAVENOUS
  Administered 2023-02-02 (×2): 100 ug via INTRAVENOUS
  Administered 2023-02-02: 50 ug via INTRAVENOUS

## 2023-01-28 MED ORDER — TRAVASOL 10 % IV SOLN
INTRAVENOUS | Status: AC
  Filled 2023-01-28: qty 1122

## 2023-01-28 MED ORDER — FENTANYL 2500MCG IN NS 250ML (10MCG/ML) PREMIX INFUSION
25.0000 ug/h | INTRAVENOUS | Status: DC
  Administered 2023-01-28: 25 ug/h via INTRAVENOUS
  Filled 2023-01-28: qty 250

## 2023-01-28 MED ORDER — INSULIN ASPART 100 UNIT/ML IJ SOLN
0.0000 [IU] | INTRAMUSCULAR | Status: DC
  Administered 2023-01-28: 3 [IU] via SUBCUTANEOUS

## 2023-01-28 MED ORDER — FENTANYL CITRATE PF 50 MCG/ML IJ SOSY: 12.5000 ug | PREFILLED_SYRINGE | INTRAMUSCULAR | Status: DC | PRN

## 2023-01-28 MED ORDER — MIDAZOLAM HCL 2 MG/2ML IJ SOLN
4.0000 mg | Freq: Once | INTRAMUSCULAR | Status: AC
  Administered 2023-01-28: 4 mg via INTRAVENOUS

## 2023-01-28 MED ORDER — CALCIUM GLUCONATE-NACL 1-0.675 GM/50ML-% IV SOLN
1.0000 g | Freq: Once | INTRAVENOUS | Status: AC
  Administered 2023-01-28: 1000 mg via INTRAVENOUS
  Filled 2023-01-28: qty 50

## 2023-01-28 MED ORDER — MAGNESIUM SULFATE 4 GM/100ML IV SOLN
4.0000 g | Freq: Once | INTRAVENOUS | Status: AC
  Administered 2023-01-28: 4 g via INTRAVENOUS
  Filled 2023-01-28: qty 100

## 2023-01-28 MED ORDER — FENTANYL CITRATE PF 50 MCG/ML IJ SOSY
25.0000 ug | PREFILLED_SYRINGE | Freq: Once | INTRAMUSCULAR | Status: AC
  Administered 2023-01-28: 25 ug via INTRAVENOUS

## 2023-01-28 MED ORDER — FENTANYL CITRATE PF 50 MCG/ML IJ SOSY: 12.5000 ug | PREFILLED_SYRINGE | Freq: Once | INTRAMUSCULAR | Status: AC

## 2023-01-28 MED ORDER — ROCURONIUM BROMIDE 10 MG/ML (PF) SYRINGE
PREFILLED_SYRINGE | INTRAVENOUS | Status: DC | PRN
  Administered 2023-01-28: 40 mg via INTRAVENOUS
  Administered 2023-01-28: 20 mg via INTRAVENOUS

## 2023-01-28 MED ORDER — POTASSIUM CHLORIDE 10 MEQ/50ML IV SOLN
10.0000 meq | INTRAVENOUS | Status: AC
  Administered 2023-01-28 (×2): 10 meq via INTRAVENOUS
  Filled 2023-01-28 (×4): qty 50

## 2023-01-28 MED ORDER — SODIUM BICARBONATE 8.4 % IV SOLN
50.0000 meq | Freq: Once | INTRAVENOUS | Status: AC
  Administered 2023-01-28: 50 meq via INTRAVENOUS
  Filled 2023-01-28: qty 50

## 2023-01-28 MED ORDER — 0.9 % SODIUM CHLORIDE (POUR BTL) OPTIME
TOPICAL | Status: DC | PRN
  Administered 2023-01-28: 1000 mL
  Administered 2023-01-29: 3000 mL

## 2023-01-28 MED ORDER — ACETAMINOPHEN 10 MG/ML IV SOLN
1000.0000 mg | Freq: Four times a day (QID) | INTRAVENOUS | Status: AC
  Administered 2023-01-28 – 2023-01-29 (×4): 1000 mg via INTRAVENOUS
  Filled 2023-01-28 (×4): qty 100

## 2023-01-28 MED ORDER — LACTATED RINGERS IV SOLN: INTRAVENOUS | Status: DC | PRN

## 2023-01-28 MED ORDER — PIPERACILLIN-TAZOBACTAM 3.375 G IVPB
3.3750 g | Freq: Three times a day (TID) | INTRAVENOUS | Status: DC
  Administered 2023-01-28 – 2023-02-05 (×25): 3.375 g via INTRAVENOUS
  Filled 2023-01-28 (×25): qty 50

## 2023-01-28 MED ORDER — CALCIUM CHLORIDE 10 % IV SOLN
INTRAVENOUS | Status: DC | PRN
  Administered 2023-01-28 (×2): 250 mg via INTRAVENOUS
  Administered 2023-01-28: 1 g via INTRAVENOUS
  Administered 2023-01-28 (×2): 250 mg via INTRAVENOUS

## 2023-01-28 MED ORDER — PHENYLEPHRINE 80 MCG/ML (10ML) SYRINGE FOR IV PUSH (FOR BLOOD PRESSURE SUPPORT)
PREFILLED_SYRINGE | INTRAVENOUS | Status: AC
  Filled 2023-01-28: qty 10

## 2023-01-28 MED ORDER — CEFAZOLIN SODIUM 1 G IJ SOLR
INTRAMUSCULAR | Status: AC
  Filled 2023-01-28: qty 20

## 2023-01-28 MED ORDER — EPINEPHRINE PF 1 MG/ML IJ SOLN
INTRAMUSCULAR | Status: AC
  Filled 2023-01-28: qty 1

## 2023-01-28 MED ORDER — MAGNESIUM SULFATE 2 GM/50ML IV SOLN
2.0000 g | Freq: Once | INTRAVENOUS | Status: AC
  Administered 2023-01-28: 2 g via INTRAVENOUS
  Filled 2023-01-28: qty 50

## 2023-01-28 MED ORDER — ORAL CARE MOUTH RINSE
15.0000 mL | OROMUCOSAL | Status: DC
  Administered 2023-01-28 – 2023-02-07 (×114): 15 mL via OROMUCOSAL

## 2023-01-28 MED ORDER — ORAL CARE MOUTH RINSE: 15.0000 mL | OROMUCOSAL | Status: DC | PRN

## 2023-01-28 MED ORDER — MIDAZOLAM-SODIUM CHLORIDE 100-0.9 MG/100ML-% IV SOLN: 0.5000 mg/h | INTRAVENOUS | Status: DC

## 2023-01-28 MED ORDER — SODIUM BICARBONATE 4.2 % IV SOLN
INTRAVENOUS | Status: DC | PRN
  Administered 2023-01-28 (×3): 50 meq via INTRAVENOUS

## 2023-01-28 MED ORDER — DEXMEDETOMIDINE HCL IN NACL 400 MCG/100ML IV SOLN
INTRAVENOUS | Status: AC
  Filled 2023-01-28: qty 100

## 2023-01-28 MED ORDER — DOCUSATE SODIUM 50 MG/5ML PO LIQD: 100.0000 mg | Freq: Two times a day (BID) | ORAL | Status: DC

## 2023-01-28 MED ORDER — ACETAMINOPHEN 10 MG/ML IV SOLN
1000.0000 mg | Freq: Four times a day (QID) | INTRAVENOUS | Status: DC | PRN
  Administered 2023-01-28: 1000 mg via INTRAVENOUS
  Filled 2023-01-28: qty 100

## 2023-01-28 MED ORDER — MIDAZOLAM HCL 2 MG/2ML IJ SOLN
INTRAMUSCULAR | Status: AC
  Filled 2023-01-28: qty 4

## 2023-01-28 MED ORDER — MIDAZOLAM-SODIUM CHLORIDE 100-0.9 MG/100ML-% IV SOLN
INTRAVENOUS | Status: AC
  Filled 2023-01-28: qty 100

## 2023-01-28 MED ORDER — SODIUM CHLORIDE 0.9% IV SOLUTION: Freq: Once | INTRAVENOUS | Status: AC

## 2023-01-28 MED ORDER — DEXTROSE 50 % IV SOLN
25.0000 g | INTRAVENOUS | Status: AC
  Administered 2023-01-28: 25 g via INTRAVENOUS

## 2023-01-28 MED ORDER — CEFAZOLIN SODIUM-DEXTROSE 2-3 GM-%(50ML) IV SOLR
INTRAVENOUS | Status: DC | PRN
  Administered 2023-01-28: 2 g via INTRAVENOUS

## 2023-01-28 MED ORDER — CALCIUM CHLORIDE 10 % IV SOLN
2.0000 g | Freq: Once | INTRAVENOUS | Status: AC
  Administered 2023-01-28: 2 g via INTRAVENOUS

## 2023-01-28 MED ORDER — DEXTROSE 50 % IV SOLN
INTRAVENOUS | Status: AC
  Filled 2023-01-28: qty 50

## 2023-01-28 MED ORDER — DEXTROSE 50 % IV SOLN
12.5000 g | Freq: Once | INTRAVENOUS | Status: AC
  Administered 2023-01-28: 12.5 g via INTRAVENOUS
  Filled 2023-01-28: qty 50

## 2023-01-28 MED ORDER — FENTANYL 2500MCG IN NS 250ML (10MCG/ML) PREMIX INFUSION
50.0000 ug/h | INTRAVENOUS | Status: DC
  Administered 2023-01-29: 100 ug/h via INTRAVENOUS
  Administered 2023-01-30: 125 ug/h via INTRAVENOUS
  Administered 2023-01-31: 200 ug/h via INTRAVENOUS
  Administered 2023-02-01 – 2023-02-02 (×3): 150 ug/h via INTRAVENOUS
  Administered 2023-02-03: 50 ug/h via INTRAVENOUS
  Administered 2023-02-03: 200 ug/h via INTRAVENOUS
  Filled 2023-01-28 (×9): qty 250

## 2023-01-28 MED ORDER — POTASSIUM CHLORIDE 10 MEQ/50ML IV SOLN
10.0000 meq | INTRAVENOUS | Status: AC
  Administered 2023-01-28 (×4): 10 meq via INTRAVENOUS
  Filled 2023-01-28 (×2): qty 50

## 2023-01-28 MED ORDER — POLYETHYLENE GLYCOL 3350 17 G PO PACK: 17.0000 g | PACK | Freq: Every day | ORAL | Status: DC

## 2023-01-28 MED ORDER — VITAMIN K1 10 MG/ML IJ SOLN
10.0000 mg | Freq: Once | INTRAVENOUS | Status: AC
  Administered 2023-01-28: 10 mg via INTRAVENOUS
  Filled 2023-01-28: qty 1

## 2023-01-28 MED ORDER — SODIUM CHLORIDE 0.9 % IV SOLN
4.0000 g | Freq: Once | INTRAVENOUS | Status: AC
  Administered 2023-01-28: 4 g via INTRAVENOUS
  Filled 2023-01-28: qty 40

## 2023-01-28 MED ORDER — ALBUMIN HUMAN 5 % IV SOLN
25.0000 g | Freq: Once | INTRAVENOUS | Status: AC
  Administered 2023-01-28: 25 g via INTRAVENOUS
  Filled 2023-01-28: qty 500

## 2023-01-28 SURGICAL SUPPLY — 48 items
APL PRP STRL LF DISP 70% ISPRP (MISCELLANEOUS)
APL SKNCLS STERI-STRIP NONHPOA (GAUZE/BANDAGES/DRESSINGS) ×2
BAG COUNTER SPONGE SURGICOUNT (BAG) IMPLANT
BAG SPNG CNTER NS LX DISP (BAG)
BENZOIN TINCTURE PRP APPL 2/3 (GAUZE/BANDAGES/DRESSINGS) IMPLANT
CHLORAPREP W/TINT 26 (MISCELLANEOUS) IMPLANT
COVER MAYO STAND STRL (DRAPES) ×1 IMPLANT
COVER SURGICAL LIGHT HANDLE (MISCELLANEOUS) ×1 IMPLANT
DRAPE LAPAROSCOPIC ABDOMINAL (DRAPES) ×1 IMPLANT
DRSG OPSITE POSTOP 4X10 (GAUZE/BANDAGES/DRESSINGS) IMPLANT
DRSG OPSITE POSTOP 4X6 (GAUZE/BANDAGES/DRESSINGS) IMPLANT
DRSG OPSITE POSTOP 4X8 (GAUZE/BANDAGES/DRESSINGS) IMPLANT
ELECT REM PT RETURN 15FT ADLT (MISCELLANEOUS) ×1 IMPLANT
GLOVE BIO SURGEON STRL SZ 6 (GLOVE) ×2 IMPLANT
GLOVE BIO SURGEON STRL SZ8 (GLOVE) IMPLANT
GLOVE BIOGEL PI IND STRL 7.5 (GLOVE) IMPLANT
GLOVE BIOGEL PI IND STRL 8 (GLOVE) IMPLANT
GLOVE BIOGEL PI MICRO STRL 5.5 (GLOVE) ×1 IMPLANT
GLOVE ECLIPSE 8.0 STRL XLNG CF (GLOVE) IMPLANT
GLOVE INDICATOR 6.5 STRL GRN (GLOVE) ×2 IMPLANT
GOWN STRL REUS W/ TWL LRG LVL3 (GOWN DISPOSABLE) ×1 IMPLANT
GOWN STRL REUS W/TWL LRG LVL3 (GOWN DISPOSABLE) ×1 IMPLANT
GOWN STRL REUS W/TWL XL LVL3 (GOWN DISPOSABLE) IMPLANT
HEMOSTAT ARISTA ABSORB 3G PWDR (HEMOSTASIS) IMPLANT
KIT TURNOVER KIT A (KITS) IMPLANT
LIGASURE BLUNT TIP 5 LONG 44CM (ELECTROSURGICAL) IMPLANT
PACK GENERAL/GYN (CUSTOM PROCEDURE TRAY) ×1 IMPLANT
RELOAD PROXIMATE 75MM BLUE (ENDOMECHANICALS) ×2 IMPLANT
RELOAD STAPLE 75 3.8 BLU REG (ENDOMECHANICALS) IMPLANT
SPONGE ABD ABTHERA ADVANCE (MISCELLANEOUS) IMPLANT
SPONGE DRAIN TRACH 4X4 STRL 2S (GAUZE/BANDAGES/DRESSINGS) IMPLANT
SPONGE T-LAP 18X18 ~~LOC~~+RFID (SPONGE) IMPLANT
STAPLER PROXIMATE 75MM BLUE (STAPLE) IMPLANT
STAPLER VISISTAT 35W (STAPLE) IMPLANT
SUT ETHILON 3 0 PS 1 (SUTURE) IMPLANT
SUT MNCRL AB 4-0 PS2 18 (SUTURE) IMPLANT
SUT NOVA T20/GS 25 (SUTURE) IMPLANT
SUT SILK 2 0 (SUTURE) ×1
SUT SILK 2 0 SH CR/8 (SUTURE) ×1 IMPLANT
SUT SILK 2-0 18XBRD TIE 12 (SUTURE) ×1 IMPLANT
SUT SILK 3 0 (SUTURE) ×1
SUT SILK 3 0 SH CR/8 (SUTURE) ×1 IMPLANT
SUT SILK 3-0 18XBRD TIE 12 (SUTURE) ×1 IMPLANT
TOWEL OR 17X26 10 PK STRL BLUE (TOWEL DISPOSABLE) IMPLANT
TOWEL OR NON WOVEN STRL DISP B (DISPOSABLE) ×1 IMPLANT
TOWEL ~~LOC~~+RFID 17X26 BLUE (SPONGE) IMPLANT
TRAY FOLEY MTR SLVR 14FR STAT (SET/KITS/TRAYS/PACK) ×1 IMPLANT
TRAY FOLEY MTR SLVR 16FR STAT (SET/KITS/TRAYS/PACK) ×1 IMPLANT

## 2023-01-28 NOTE — Progress Notes (Signed)
PHARMACY - TOTAL PARENTERAL NUTRITION CONSULT NOTE   Indication: Intolerance to enteral feeding, Duodenal obstruction  Patient Measurements: Height:  (185.4 cm) Weight: 65.5 kg (144 lb 6.4 oz) IBW/kg (Calculated) : 79.9 TPN AdjBW (KG): 65.5 Body mass index is 19.05 kg/m.   Assessment: Pt is a 47 yoM admitted on 4/8 with N/V, chronic partial SBO, history of multiple previous abdominal surgeries. Pt has very minimal PO intake with ongoing weight loss. Pharmacy consulted to manage TPN.   Glucose / Insulin: No history of DM. CBGs well controlled on goal rate and CBGs/SSI d/c on 4/20. 4/23 severe hyperglycemia post op during massive transfusion protocol. Insulin drip max 20 U/hr, currently at 9./5 U/hr.  Electrolytes: K 3.3> 4 runs per MD, Ionized Ca Low @  0.92 after getting a total of 6.5 gm Ca per MD  Renal: SCr up to 1.387, BUN 33 Hepatic: ALT /ALT up,  Albumin 1.6.  Trig 23 (4/22)  Intake / Output; MIVF: net I/O + ~ 8 L (MTP)  - UOP: 650 mL/24, 310 ml drains/24 hrs mIVF: LR 100 ml/hr GI Imaging:  -4/4 UGI: Findings suggestive of chronic partial small bowel obstruction or ileus -4/9 CT Abd: Ileus vs high grade partial SBO GI Surgeries / Procedures:  -4/11 EGD: Gastric and small bowel dilation. Could have functional afferent loop syndrome or internal hernia - 4/14 emergent bedside EGD:  oozing cratered gastric ulcer w/ oozing hemorrhage> inj w/ epi & clips placed - 4/15: IR for emergency embolization - 4/22 ex lap, LOA, duodenojejunal bypass. Placed biliary T-tube, placed duodenostomy tube 4/22 2nd OR: ex lap. Transverse colectomy, abd packed & left open w/ Abthera dressing  Central access: Double lumen PICC placed 4/8 TPN start date: 4/9  Nutritional Goals:  Goal TPN rate is 85 mL/hr (provides 112 g of protein and 2101 kcals per day)  RD Assessment: Estimated Needs Total Energy Estimated Needs: 2050-2250 Total Protein Estimated Needs: 100-115g Total Fluid Estimated  Needs: 2.1L/day  Current Nutrition:  NPO  TPN  Plan:  Now: 4 K runs per CCM & Cagluc 4 gm per CCM At 1800: Continue TPN @ goal rate of 85 mL/hr  Electrolytes in TPN: Na 154 mEq/L, K 30 mEq/L, Ca 10 mEq/L, Mg 10 mEq/L, and Phos 15 mmol/L [id number] 2:1 Add standard MVI and trace elements to TPN Completed 5 days thiamine 4/13 Insulin drip per CCM mIVF per MD Monitor TPN labs on Mon/Thurs CMET, Mg & Phos in AM  Herby Abraham, Pharm.D Use secure chat for questions 01/28/2023 8:34 AM

## 2023-01-28 NOTE — Anesthesia Preprocedure Evaluation (Signed)
Anesthesia Evaluation   Patient unresponsive    Reviewed: Allergy & Precautions, NPO status , Patient's Chart, lab work & pertinent test results, Unable to perform ROS - Chart review onlyPreop documentation limited or incomplete due to emergent nature of procedure.  Airway        Dental   Pulmonary           Cardiovascular +CHF       Neuro/Psych    GI/Hepatic   Endo/Other    Renal/GU      Musculoskeletal  (+) Arthritis ,    Abdominal   Peds  Hematology  (+) Blood dyscrasia (Thrombocytopenia), anemia   Anesthesia Other Findings   Reproductive/Obstetrics                             Anesthesia Physical Anesthesia Plan Anesthesia Quick Evaluation

## 2023-01-28 NOTE — Progress Notes (Addendum)
Patient has had about 2L of output from his VAC since return from OR at 0630.  This is bloody, but is thin so not all blood.  His pressors are currently stable, but he does start dropping, but will recover with transfusion.  We are currently giving 1 cryo, 2FFP, 4 units pRBCs, and 1 plts.  I have asked for blood blank to prepare 4 more units of pRBCs and 1 units of FFP as that is all blood bank has for his type., everything else would be unmatched.  After all of his transfusions, we are going to order a STAT CBC, PT/INR as we are unable to do a TEG at [facility].  Based on these results we will give more product as needed.  If he is stable at this time, we will alert carelink to arrange for transport to [facility] to 4N24 for continued resuscitative efforts and more capabilities such as blood products etc.  We will also start zosyn for ischemic bowel noted in the OR.    Letha Cape 9:01 AM 01/28/2023

## 2023-01-28 NOTE — Progress Notes (Signed)
Report called to [facility] CCM team.      Canary Brim, MSN, APRN, NP-C, AGACNP-BC [facility] 01/28/2023, 10:39 AM   Please see [email or web address] for pager details.   From 7A-7P if no response, please call [phone] After hours, please call ELink [phone]

## 2023-01-28 NOTE — Progress Notes (Signed)
Patient is back from OR after surgery.  Wound VAC placed with 8 rags in him per surgeon.  He has filled up two cannister of 500 ml and working on a third cannister.  MD notified.  Patient still has bed holding for 4N to transfer.  Staff will call Care [facility] when he is stable to go per MD.  He had gotten 1 PRBC and 4 FFP after surgery via rapid transfusion.

## 2023-01-28 NOTE — Progress Notes (Signed)
Wife and son kept updated and informed throughout the shift by Dr. Freida Busman., Norm Salt CN and myself as Primary RN

## 2023-01-28 NOTE — Progress Notes (Signed)
PCCM Update:  Spoke with Trauma Surgery team at bedside and agreed their service would assume full care of the patient and PCCM will no longer be following.   Please call if needed.  Melody Comas, MD [place] [facility] Office: [phone]   See Amion for personal pager PCCM on call pager [phone] until 7pm. Please call Elink 7p-7a. [phone]

## 2023-01-28 NOTE — Progress Notes (Signed)
Multiple members of family updated in conference room - wife, brother, sister in law, sister and brother in law.  Reviewed details of his care overnight, transfusion needs, repeat OR trip and ongoing resuscitation with blood products and subsequent lab findings.  Reviewed pending transfer with family to [facility] for further care.  Support offered.      Canary Brim, MSN, APRN, NP-C, AGACNP-BC [facility] [facility] 01/28/2023, 10:37 AM   Please see [email or web address] for pager details.   From 7A-7P if no response, please call [phone] After hours, please call ELink [phone]

## 2023-01-28 NOTE — Progress Notes (Signed)
No Surgical Tape on Drain Tubes per Surgical MD

## 2023-01-28 NOTE — Progress Notes (Signed)
Inpatient Diabetes Program Recommendations  AACE/ADA: New Consensus Statement on Inpatient Glycemic Control (2015)  Target Ranges:  Prepandial:   less than 140 mg/dL      Peak postprandial:   less than 180 mg/dL (1-2 hours)      Critically ill patients:  140 - 180 mg/dL   Lab Results  Component Value Date   GLUCAP 283 (H) 01/28/2023   HGBA1C 5.1 01/20/2023    Review of Glycemic Control  Latest Reference Range & Units 01/27/23 21:07 01/28/23 00:26 01/28/23 01:31 01/28/23 03:21 01/28/23 06:16 01/28/23 07:50 01/28/23 08:54  Glucose-Capillary 70 - 99 mg/dL 161 (H) 096 (H) 045 (H) 325 (H) 333 (H) 210 (H) 283 (H)   Diabetes history: None  Current orders for Inpatient glycemic control:  IV insulin-  Inpatient Diabetes Program Recommendations:    Agree with IV insulin.  Recommend continuing IV insulin until transition criteria met for ICU glycemic control orders.    Thanks,  Beryl Meager, RN, BC-ADM Inpatient Diabetes Coordinator Pager [phone]  (8a-5p)

## 2023-01-28 NOTE — Transfer of Care (Signed)
Immediate Anesthesia Transfer of Care Note  Patient: Bradley Dominguez  Procedure(s) Performed: EXPLORATORY LAPAROTOMY, PARTIAL BOWEL RESECTION, AND ABTHERA WOUND VAC APPLICATION (Abdomen)  Patient Location: PACU  Anesthesia Type:General  Level of Consciousness: Patient remains intubated per anesthesia plan  Airway & Oxygen Therapy: Patient remains intubated per anesthesia plan and Patient placed on Ventilator (see vital sign flow sheet for setting)  Post-op Assessment: Report given to RN and Post -op Vital signs reviewed and stable  Post vital signs: Reviewed and stable  Last Vitals:  Vitals Value Taken Time  BP    Temp    Pulse 93 01/28/23 0553  Resp 24 01/28/23 0553  SpO2 100 % 01/28/23 0553  Vitals shown include unvalidated device data.  Last Pain:  Vitals:   01/27/23 1838  TempSrc: Oral  PainSc:          Complications: No notable events documented.

## 2023-01-28 NOTE — Progress Notes (Incomplete)
PHARMACY - TOTAL PARENTERAL NUTRITION CONSULT NOTE  Indication: Duodenal obstruction  Patient Measurements: Height:  (185.4 cm) Weight: 65.5 kg (144 lb 6.4 oz) IBW/kg (Calculated) : 79.9 TPN AdjBW (KG): 65.5 Body mass index is 19.05 kg/m.  Assessment:  83 yoM admitted on 4/8 with N/V, chronic partial SBO, history of multiple previous abdominal surgeries. Pt has very minimal PO intake with ongoing weight loss.  Per documentation, patient has severe PCM.  Pharmacy consulted to manage TPN.   Glucose / Insulin: no hx DM - CBGs well controlled and CBGs/SSI d/c on 4/20. 4/23 severe hyperglycemia post op and started on insulin gtt Electrolytes:  Renal:  Hepatic: LFTs elevated post-op 4/22, tbili / TG WNL, albumin 1.6 Intake / Output; MIVF:  GI Imaging:  -4/4 UGI: Findings suggestive of chronic partial small bowel obstruction or ileus -4/9 CT Abd: Ileus vs high grade partial SBO GI Surgeries / Procedures:  -4/11 EGD: Gastric and small bowel dilation. Could have functional afferent loop syndrome or internal hernia - 4/14 emergent bedside EGD:  oozing cratered gastric ulcer w/ oozing hemorrhage> inj w/ epi & clips placed - 4/15: IR for emergency embolization - 4/22 ex lap, LOA, duodenojejunal bypass. Placed biliary T-tube, placed duodenostomy tube 4/22 2nd OR: ex lap. Transverse colectomy, abd packed & left open w/ Abthera dressing  Central access: double lumen PICC placed 01/13/23 TPN start date: 01/14/23  Nutritional Goals: RD Estimated Needs Total Energy Estimated Needs: 2050-2250 Total Protein Estimated Needs: 100-115g Total Fluid Estimated Needs: 2.1L/day  Current Nutrition:   TPN  Plan:  Continue TPN at goal rate of 85 ml/hr to provide 112g AA, *** CHO and 2101 kCal per day, meeting 100% of needs Electrolytes in TPN: Na 154 mEq/L, K 30 mEq/L, Ca 10 mEq/L, Mg 10 mEq/L, and Phos 15 mmol/L, Cl:Ac 2:1 Add standard MVI and trace elements to TPN Insulin drip per CCM mIVF per  MD Monitor TPN labs on Mon/Thurs

## 2023-01-28 NOTE — Anesthesia Postprocedure Evaluation (Signed)
Anesthesia Post Note  Patient: Bradley Dominguez  Procedure(s) Performed: EXPLORATORY LAPAROTOMY, PARTIAL BOWEL RESECTION, AND ABTHERA WOUND VAC APPLICATION (Abdomen)     Patient location during evaluation: SICU Anesthesia Type: General Level of consciousness: sedated Pain management: pain level controlled Vital Signs Assessment: vitals unstable Respiratory status: patient remains intubated per anesthesia plan Cardiovascular status: unstable Postop Assessment: no apparent nausea or vomiting Anesthetic complications: no   No notable events documented.  Last Vitals:  Vitals:   01/28/23 0542 01/28/23 0610  BP:    Pulse:    Resp:    Temp:    SpO2: 98% 98%    Last Pain:  Vitals:   01/27/23 1838  TempSrc: Oral  PainSc:                  Wynelle Dreier

## 2023-01-28 NOTE — Plan of Care (Signed)
Family update about the patient by the OR MD Dr. Freida Busman -  continuing blood products still hemodynamically stable for transfer to [facility]  Problem: Education: Goal: Knowledge of General Education information will improve Description: Including pain rating scale, medication(s)/side effects and non-pharmacologic comfort measures Outcome: Progressing   Problem: Health Behavior/Discharge Planning: Goal: Ability to manage health-related needs will improve Outcome: Progressing

## 2023-01-28 NOTE — Progress Notes (Signed)
Report called to 4N to [place] Allen RN.  Patient was going to be transferred after he receives blood products via rapid transfusion.  Patient was to hemodynamically unstable for Care link to transfer after arrival.  Patient is being taken back to OR for surgery and will reevaluate afterwards.

## 2023-01-28 NOTE — Progress Notes (Signed)
Verified with surgical MD patient getting more blood products ( 1 PLT, 1 Cryo, 2 FFP and 1 PRBC's).  CN and day shift RN made aware.

## 2023-01-28 NOTE — Plan of Care (Signed)
Discussed with family in front of patient plan of care for the evening, pain and blood pressure medications with some teach back displayed.  Patient has no evidence by patient who is on a ventilator.   Problem: Education: Goal: Knowledge of General Education information will improve Description: Including pain rating scale, medication(s)/side effects and non-pharmacologic comfort measures 01/28/2023 0923 by Miki Kins, RN Outcome: Progressing   Problem: Health Behavior/Discharge Planning: Goal: Ability to manage health-related needs will improve 01/28/2023 0923 by Miki Kins, RN Outcome: Progressing

## 2023-01-28 NOTE — Anesthesia Preprocedure Evaluation (Signed)
Anesthesia Evaluation   Patient unresponsive    Reviewed: Unable to perform ROS - Chart review onlyPreop documentation limited or incomplete due to emergent nature of procedure.  Airway Mallampati: Intubated       Dental   Pulmonary           Cardiovascular      Neuro/Psych    GI/Hepatic   Endo/Other    Renal/GU      Musculoskeletal   Abdominal   Peds  Hematology Lab Results      Component                Value               Date                      WBC                      5.6                 01/28/2023                HGB                      6.0 (LL)            01/28/2023                HCT                      18.1 (L)            01/28/2023                MCV                      90.0                01/28/2023                PLT                      101 (L)             01/28/2023                PLT                      100 (L)             01/28/2023            Lab Results      Component                Value               Date                      INR                      1.7 (H)             01/28/2023                INR                      1.6 (H)  01/27/2023                INR                      1.1                 01/23/2023              Anesthesia Other Findings   Reproductive/Obstetrics                             Anesthesia Physical Anesthesia Plan  ASA: 5 and emergent  Anesthesia Plan: General   Post-op Pain Management:    Induction: Intravenous  PONV Risk Score and Plan: 2 and Treatment may vary due to age or medical condition  Airway Management Planned: Oral ETT  Additional Equipment: Arterial line and CVP  Intra-op Plan:   Post-operative Plan: Post-operative intubation/ventilation  Informed Consent:      Only emergency history available and History available from chart only  Plan Discussed with: Surgeon and CRNA  Anesthesia Plan Comments:         Anesthesia Quick Evaluation

## 2023-01-28 NOTE — Progress Notes (Signed)
This RN and Britta Mccreedy May RN gave: -4 units PRBC's -2 units FFP -1 unit platelets  -1 unit cryo Per verbal orders from [employer] PA-C under the MTP from [phone].

## 2023-01-28 NOTE — Progress Notes (Addendum)
NAME:  Bradley Dominguez, MRN:  [id number], DOB:  1956-08-31, LOS: 15 ADMISSION DATE:  01/13/2023, CONSULTATION DATE: 01/19/2023 REFERRING MD: Dr. Freida Busman, CHIEF COMPLAINT: GI bleed  History of Present Illness:  67 yo M HF, chronic partial small bowel obstruction and multiple previous abd surgeries  who was admitted to [facility] 4/8 from home w n/v/bloating. GI consult 4/10, underwent EGD 4/11 -- concerning for duodenal obstruction. Plan for 4/15 ex lap LOA duodenojejunal bypass.    On 4/14 started vomiting blood, dark red and clots noted. BP on lower end but baseline BP with systolics in 80's to low 90's so not far. Repeat hgb in 8's from 9. Denies pain or nausea, just fatigued. GI reconsulted and considering repeat endoscopy.  Developed hemorrhagic shock 4/15 in the setting of hematemesis / hematochezia, decompensated requiring MTP, intubation, TXA and embolization of jejunal arcade branch.    PCCM consulted.  Pertinent  Medical History   has a past medical history of Arthritis, Bowel obstruction, CHF (congestive heart failure) (11/26/2018), Elevated brain natriuretic peptide (BNP) level (11/25/2018), Family history of adverse reaction to anesthesia, Frequent UTI, History of colon polyps (2009), History of shingles, Plantar fasciitis, Prostate hypertrophy, and Vitamin B 12 deficiency.  Significant Hospital Events: Including procedures, antibiotic start and stop dates in addition to other pertinent events   4/8 Admit 4/11 EGD > duodenal obstruction 4/14 vomiting blood, hypotensive pccm consult 4/15 Significant hematemesis / hematochezia, decompensated > intubated. IR for embolization of jejunal arcade branch, MTP required, TXA. 4/17 Extubated  4/20 PCCM off, [facility] assisting with medical care. [facility] remains primary.  4/22 OR for ex-lap, lysis of adhesions, returned on vent, shock > ABLA. Returned to ICU on vent, post op.  Per report had 2L EBL+.  Per CRNA > got 4 units PRBC, 1 L albumin, 2L crystalloid, new R  radial aline, on EPI , Levo 20 mcg, Dex 0.7, 2amps bicarb prior to arrival, additional neosynephrine in case + bolus epi.   Interim History / Subjective:  Overnight > pt with persistent hypotension, concern for ongoing bleeding, returned to OR for bleeding. Operative findings > hemoperitoneum with two actively bleeding small arteries in the small bowel mesentery, controlled with suture ligation, diffuse surface oozing on the retroperitoneum consistent with coagulopathy, ischemia of transverse colon.    Received ~ 9 units PRBC, 4 FFP, 1 cryo, 1 platelet, additional 4 FFP ordered  I/O 650 ml UOP, 3.9L blood products, +7.9L in last 24h Afebrile  Objective   Blood pressure 92/70, pulse (!) 124, temperature 97.6 F (36.4 C), resp. rate (!) 31, height  (1.854 m), weight 65.5 kg, SpO2 98 %.    Vent Mode: PRVC FiO2 (%):  [40 %-100 %] 60 % Set Rate:  [15 bmp-24 bmp] 24 bmp Vt Set:  [630 mL] 630 mL PEEP:  [5 cmH20] 5 cmH20 Plateau Pressure:  [16 cmH20-18 cmH20] 16 cmH20   Intake/Output Summary (Last 24 hours) at 01/28/2023 0739 Last data filed at 01/28/2023 0641 Gross per 24 hour  Intake 12820.81 ml  Output 4860 ml  Net 7960.81 ml   Filed Weights   01/26/23 0500 01/27/23 0500 01/27/23 0937  Weight: 65.3 kg 65.5 kg 65.5 kg    Examination:  General: critically ill appearing adult male lying in bed on vent HEENT: MM pink/moist, ETT, anicteric, pupils =/reactive 3mm. Neuro: sedate on precedex, moves upper extremities spontaneously, mild grimace on face CV: s1s2 RRR, NSR 80's on monitor, no m/r/g PULM: non-labored at rest, lungs bilaterally clear with  good air entry  GI: abd with midline VAC in place - superior portion of wound with bowel noted, dressing intact with serosanguinous drainage in cannister. Biliary T-Tube in RUQ, duodenostomy on R mid abd with bloody brown drainage at site, JP drain on L with serosanguinous drainage Extremities: warm/dry, no peripheral edema  Skin: no  rashes or lesions Gtts: dex 0.4, epi 0.7, levo 20, vaso 0.04,  Vasc:  R IJ cordis, L IJ TLC, R radial aline  Resolved Hospital Problem list     Assessment & Plan:   Hemorrhagic Shock  Acute Blood Loss Anemia Thrombocytopenia  DIC (pre-op) Secondary to gastric ulcer s/p clip 4/14 and 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 Required MTP protocol, IR for embolization.  No schistocytes on peripheral smear.  -appreciate [facility] -complete additional 4 units PRBC, 2 FFP, 1 pk platelets now (just finished cryo) -additional 4 gm calcium now  -Vitamin K 10 mg IV x1 now -assess post transfusion labs > INR, CBC -trend serial CBC -likely will need transfusion in transport to [facility]  -care plan discussed with [facility] at bedside > Dr. Geoffery Spruce, PA-C  Post-Operative Ventilator Management At Risk TACO/TRALI s/p Mass Transfusion   Intubated 4/15 in setting of hemorrhagic shock. Return to OR 4/22 for duodenal obstruction with lysis of adhesions.  -PRVC with LTVV  -increased rate with acidosis  -follow CXR, at risk ALI -no WUA/SBT with open abdomen  -wean PEEP / FiO2 for sats >90%  Duodenal Obstruction Nonfunctioning prior GJ bypass. Initial plan was for surgery 4/15, but developed hemorrhagic shock. EGD path negative.  4/22 post ex-lap with lysis of adhesions, T-tube placed for CBD, duodenostomy tube. Course complicated by post-op by hemorrhagic shock.  -NPO -NOTHING BY NGT per Dr. Freida Busman  -continue TPN  -[facility] anticipates ileus post operatively  -NO DRESSINGS on drains of any kind (tape, gauze etc) -BID PPI  AKI  Hypokalemia, Hypocalcemia Acute Metabolic Acidosis / Lactic Acidosis  -Trend BMP / urinary output -Replace electrolytes as indicated, replace potassium, Ca+ -Avoid nephrotoxic agents, ensure adequate renal perfusion  Hyperglycemia -insulin gtt per hyperglycemia protocol   Transaminitis  In the setting of shock/ischemic perfusion -follow LFT's    Acute Metabolic Encephalopathy post ABD Surgery  -PAD protocol with precedex  -low dose fentanyl 12.5-25 as he was sensitive  -delirium prevention measures  Severe Protein Calorie Malnutrition due to Duodenal Obstruction (POA) -TPN per pharmacy   Central Access -PICC line care per NSG  -monitor cordis, TLC  Left Arm Swelling, DVT RULED OUT  Superficial Vein Thrombosis Positive Left Basilic Vein, L Cephalic Vein  -warm compress, elevate arm   Best Practice (right click and "Reselect all SmartList Selections" daily)  Diet/type: TPN DVT prophylaxis: SCD GI prophylaxis: PPI BID Lines: Central line and PICC Foley:  Yes, and it is still needed Code Status:  full code Last date of multidisciplinary goals of care discussion: Wife asleep in the car, family at bedside calling her back. Will update on arrival.   Critical Care Time: 70 minutes   Canary Brim, MSN, APRN, NP-C, AGACNP-BC  Pulmonary & Critical Care 01/28/2023, 7:39 AM   Please see [email or web address] for pager details.   From 7A-7P if no response, please call [phone] After hours, please call ELink [phone]

## 2023-01-28 NOTE — Progress Notes (Signed)
Report received from [facility] RN Mat Carne.

## 2023-01-28 NOTE — Progress Notes (Signed)
Pt CBG 63 despite receiving TPN. New order for 12.5 g of D50 per standing order.    Octavia Bruckner RN

## 2023-01-28 NOTE — Progress Notes (Signed)
1 unit RBC transfused at 1141; Unit #: [id number]  Was verified by two Rns. Paperwork was put in the patients chart. Vitals were documented; transfusion finished 1212  01/28/2023 Oralia Manis, RN 7:48 PM

## 2023-01-28 NOTE — Progress Notes (Signed)
Transported PT from OR back to ICU on vent. PT tolerated well no complications. OR staff present.

## 2023-01-28 NOTE — Anesthesia Procedure Notes (Signed)
Central Venous Catheter Insertion Performed by: Val Eagle, MD, anesthesiologist Start/End4/23/2024 4:28 AM, 01/28/2023 4:35 AM Patient location: OR. Emergency situation Preanesthetic checklist: patient identified and monitors and equipment checked Hand hygiene performed  Catheter size: 12 Fr Triple lumen Procedure performed using ultrasound guided technique. Ultrasound Notes:anatomy identified, needle tip was noted to be adjacent to the nerve/plexus identified, no ultrasound evidence of intravascular and/or intraneural injection and image(s) printed for medical record Attempts: 1 Following insertion, line sutured, dressing applied and Biopatch. Post procedure assessment: blood return through all ports, free fluid flow and no air  Patient tolerated the procedure well with no immediate complications.

## 2023-01-28 NOTE — Progress Notes (Signed)
Patient was initially very volume responsive earlier in the night, with a decrease in pressor requirement and improvement in acidosis. However around 12:30am he had an increase in pressor requirement with worsening acidosis, and new abdominal distension with elevated peak airway pressures. Transfer to [facility] had been planned but was delayed due to limited availability of critical care transport. Patient continued to deteriorate while awaiting transport, in spite of nearly continuous blood transfusion. He was taken to the operating room emergently at [facility]. Two bleeding vessels were identified and ligated, and the transverse colon was resected due to ischemia. Abdomen was packed and left open with an Abthera dressing. Will continue resuscitation and plan for return to the OR in 24-48 hours. Postop labs are pending, including CBC, CMP, DIC panel and ABG. Patient received 9u PRBCs, 4u FFP, 1 cryo and 1 plt intra-op. Transfuse an additional 4u FFP now and await results of labs. Wean pressors as able. Plan for transfer to [facility] this morning after further resuscitation.  Sophronia Simas, MD [facility], Hepatobiliary and Pancreatic Surgery 01/28/23 6:28 AM

## 2023-01-28 NOTE — Op Note (Signed)
Date: 01/28/23  Patient: Bradley Dominguez MRN: [id number]  Preoperative Diagnosis: Postoperative hemorrhagic shock Postoperative Diagnosis: Hemorrhagic shock, colonic ischemia  Procedure:  Exploratory laparotomy Transverse colectomy Temporary abdominal closure with negative pressure dressing  Surgeon: Sophronia Simas, MD Assistant: Marca Ancona, MD  EBL: Massive (large amount of clot evacuated from abdominal cavity)  Anesthesia: General endotracheal  Specimens: Transverse colon  Indications: Bradley Dominguez is a 67 yo male who underwent exploratory laparotomy with adhesiolysis and duodenojejunal bypass yesterday. Overnight he became increasingly acidotic and hypotensive, with persistent transfusion requirement despite adequate warming and correction of acidosis. He was brought to the OR emergently for re-exploration.  Findings: Hemoperitoneum with two actively bleeding small arteries in the small bowel mesentery, controlled with suture ligation. Diffuse surface oozing on the retroperitoneum consistent with coagulopathy. Ischemia of the transverse colon.  Procedure details: Emergent consent was obtained from the patient's family verbally prior to the procedure. The patient was brought to the operating room and placed on the table in the supine position. He was already intubated, and general anesthesia was induced. The abdomen was prepped and draped in the usual sterile fashion. A pre-procedure timeout was taken verifying patient identity, surgical site and procedure to be performed.  The staples were removed from the previous skin incision.  The fascial sutures were cut and the abdomen was entered.  There was a very large amount of clot throughout the abdomen, especially in the right upper quadrant.  Clot was evacuated and blood continue to pull in the right upper quadrant.  A small arterial bleeding vessel was identified on the mesentery of the jejunum in the right upper quadrant near the duodenum  jejunal anastomosis.  This was oversewn with a 2-0 silk figure-of-eight suture and was hemostatic.  An additional small bleeding arterial vessel was identified on the proximal jejunal mesentery, which was oversewn with a 3-0 silk figure-of-eight suture.  After this, there was small amount of raw surface oozing on the retroperitoneum but otherwise no significant bleeding.  Clot was evacuated out of the remainder of the abdomen and no further large bleeding vessels were noted.  The transverse colon was ischemic in appearance and the decision was made to resect this.  The small bowel appeared well-perfused, as did the ascending colon, descending colon, and upper rectum.  A GIA 75 mm blue load stapler was used to transect the colon just proximal to the hepatic flexure and just proximal to the splenic flexure.  The anterior was divided with LigaSure.  The specimen was passed off the field and sent for routine pathology.  The abdomen was irrigated with saline.  There was still some oozing in the right upper quadrant.  The right upper quadrant was packed for several minutes with improvement in the oozing.  Additional packs were placed in the right upper quadrant and right lateral abdomen on the retroperitoneum (a total of 8 laparotomy pads).  An ABThera negative pressure dressing was applied and placed to continuous suction.  All counts were correct x2 at the end of the procedure. The patient remained intubated and was transported back to the ICU in critical condition for further care.  Sophronia Simas, MD 01/28/23 5:50 AM

## 2023-01-28 NOTE — Progress Notes (Signed)
Shift report of blood products:  After returning from OR after 0615 today:  Massive transfusion   Product   Start   Stop FFP  4/23 0638  4/23 0641 FFP   4/23 0634  4/23 0637 FFP  4/23 0630  4/23 0633 FFP  4/23 0624  4/23 0628  PRBC  4/23 0618  4/23 0624   In OR from 1610-9604 today  Patient received the following (see blood sheets for times): 9 units PRBC's 4 units FFP 1 unit Cryo 1 unit Platelets  Prior to OR before 0400 today:  Massive transfusion  Product   Start   Stop FFP  4/23 0343  4/23 0347   PRBC  4/23 0346  4/23 0349 PRBC  4/23 0338  4/23 0342   Several of the FFP and PRBC's given by the rapid infusion machine, by pressure bags and alaris pump when appropriate (Cyro and Platelets).  Other potential not scanned during the emergency transfusion where potentially 2 Levophed, 2 Precedex bags as well. (FYI to pharmacy).

## 2023-01-28 NOTE — Consult Note (Addendum)
Reason for Consult/Chief Complaint: critical care management  Consultant: Freida Busman, MD  Bradley Dominguez is an 67 y.o. male.   HPI: 48M with chronic intermittent bloating, presenting with worsening symptoms, PO intolerance and dehydration. 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 Underwent exploratory laparotomy with extensive adhesiolysis and duodenojejunal bypass 01/27/23 followed by takeback for hypotension with transverse colectomy left in discontinuity for colonic ischemia, abdominal packing (8 laps left in), and abdomen was left open. Post-operatively, required aggressive resuscitation. Intra-op, patient received 9/4/1/1. Prior to transport to [facility], received 6/7/1/1 and arrived with 2+2 in coolers. On arrival, was on levo at 20, epi at 0.7, vaso at 0.03, precedex, actively receiving calcium and potassium infusions.   Past Medical History:  Diagnosis Date   Arthritis    HANDS   Bowel obstruction    CHF (congestive heart failure) 11/26/2018   Elevated brain natriuretic peptide (BNP) level 11/25/2018   Family history of adverse reaction to anesthesia    FATHER SLOW TO AWAKEN, MOTHER POST OP N/V   Frequent UTI    History of colon polyps 2009   History of shingles    Plantar fasciitis    Prostate hypertrophy    Vitamin B 12 deficiency     Past Surgical History:  Procedure Laterality Date   APPENDECTOMY     BIOPSY  01/16/2023   Procedure: BIOPSY;  Surgeon: Lynann Bologna, MD;  Location: [facility];  Service: Gastroenterology;;   CHOLECYSTECTOMY     COLONSCOPY  AGE 79   WITH POLYP REMOVED   ESOPHAGOGASTRODUODENOSCOPY N/A 01/19/2023   Procedure: ESOPHAGOGASTRODUODENOSCOPY (EGD);  Surgeon: Lynann Bologna, MD;  Location: [facility];  Service: Gastroenterology;  Laterality: N/A;   ESOPHAGOGASTRODUODENOSCOPY (EGD) WITH PROPOFOL N/A 01/16/2023   Procedure: ESOPHAGOGASTRODUODENOSCOPY (EGD) WITH PROPOFOL;  Surgeon: Lynann Bologna, MD;  Location: [facility];  Service: Gastroenterology;  Laterality: N/A;   HEMOSTASIS CLIP PLACEMENT  01/19/2023   Procedure: HEMOSTASIS CLIP PLACEMENT;  Surgeon: Lynann Bologna, MD;  Location: [facility];  Service: Gastroenterology;;   HEMOSTASIS CONTROL  01/19/2023   Procedure: HEMOSTASIS CONTROL;  Surgeon: Lynann Bologna, MD;  Location: [facility];  Service: Gastroenterology;;   IR ANGIOGRAM SELECTIVE EACH ADDITIONAL VESSEL  01/20/2023   IR ANGIOGRAM SELECTIVE EACH ADDITIONAL VESSEL  01/20/2023   IR ANGIOGRAM SELECTIVE EACH ADDITIONAL VESSEL  01/20/2023   IR ANGIOGRAM VISCERAL SELECTIVE  01/20/2023   IR ANGIOGRAM VISCERAL SELECTIVE  01/20/2023   IR CHEST FLUORO  01/20/2023   IR EMBO ART  VEN HEMORR LYMPH EXTRAV  INC GUIDE ROADMAPPING  01/20/2023   IR FLUORO GUIDE CV LINE RIGHT  01/20/2023   IR US GUIDE VASC ACCESS RIGHT  01/20/2023   IR US GUIDE VASC ACCESS RIGHT  01/20/2023   LAPAROTOMY N/A 01/27/2023   Procedure: EXPLORATORY LAPAROTOMY, LYSIS OF ADHESIONS, DUODENOJEJUNAL BYPASS, PLACEMENT OF BILIARY T-TUBE, PLACEMENT OF DUODENOSTOMY TUBE;  Surgeon: Fritzi Mandes, MD;  Location: [facility];  Service: General;  Laterality: N/A;   SMALL INTESTINE SURGERY     1979   SUBMUCOSAL INJECTION  01/19/2023   Procedure: SUBMUCOSAL INJECTION;  Surgeon: Lynann Bologna, MD;  Location: [facility];  Service: Gastroenterology;;   TRANSURETHRAL RESECTION OF PROSTATE N/A 09/22/2017   Procedure: TRANSURETHRAL RESECTION OF THE PROSTATE (TURP);  Surgeon: Malen Gauze, MD;  Location: [facility];  Service: Urology;  Laterality: N/A;    Family History  Problem Relation Age of Onset   Breast cancer Mother  Heart attack Father    Diabetes Other    Colon cancer Neg Hx    Esophageal cancer Neg Hx    Stomach cancer Neg Hx    Colon polyps Neg Hx    Rectal cancer Neg Hx     Social History:  reports that he has never smoked. He has never used smokeless tobacco. He reports that he does  not drink alcohol and does not use drugs.  Allergies: No Known Allergies  Medications: I have reviewed the patient's current medications.  Results for orders placed or performed during the hospital encounter of 01/13/23 (from the past 48 hour(s))  Glucose, capillary     Status: None   Collection Time: 01/26/23  4:18 PM  Result Value Ref Range   Glucose-Capillary 90 70 - 99 mg/dL    Comment: Glucose reference range applies only to samples taken after fasting for at least 8 hours.   Comment 1 Notify RN    Comment 2 Document in Chart   Glucose, capillary     Status: Abnormal   Collection Time: 01/27/23 12:01 AM  Result Value Ref Range   Glucose-Capillary 109 (H) 70 - 99 mg/dL    Comment: Glucose reference range applies only to samples taken after fasting for at least 8 hours.   Comment 1 Notify RN    Comment 2 Document in Chart   Type and screen [facility]     Status: None (Preliminary result)   Collection Time: 01/27/23  2:52 AM  Result Value Ref Range   ABO/RH(D) O NEG    Antibody Screen NEG    Sample Expiration      01/30/2023,2359 Performed at [facility], [address] [place], [place] [address]    Unit Number [id number]    Blood Component Type RBC LR PHER2    Unit division 00    Status of Unit ISSUED    Transfusion Status OK TO TRANSFUSE    Crossmatch Result Compatible    Unit Number [id number]    Blood Component Type RBC LR PHER1    Unit division 00    Status of Unit ISSUED    Transfusion Status OK TO TRANSFUSE    Crossmatch Result Compatible    Unit Number [id number]    Blood Component Type RBC LR PHER1    Unit division 00    Status of Unit ISSUED    Transfusion Status OK TO TRANSFUSE    Crossmatch Result Compatible    Unit Number [id number]    Blood Component Type RBC LR PHER2    Unit division 00    Status of Unit ISSUED    Transfusion Status OK TO TRANSFUSE    Crossmatch Result Compatible    Unit Number  [id number]    Blood Component Type RED CELLS,LR    Unit division 00    Status of Unit ISSUED    Transfusion Status OK TO TRANSFUSE    Crossmatch Result COMPATIBLE    Unit Number [id number]    Blood Component Type RBC LR PHER1    Unit division 00    Status of Unit ISSUED    Transfusion Status OK TO TRANSFUSE    Crossmatch Result COMPATIBLE    Unit Number [id number]    Blood Component Type RED CELLS,LR    Unit division 00    Status of Unit ISSUED    Transfusion Status OK TO TRANSFUSE    Crossmatch Result COMPATIBLE    Unit Number [id number]  Blood Component Type RED CELLS,LR    Unit division 00    Status of Unit ISSUED    Transfusion Status OK TO TRANSFUSE    Crossmatch Result COMPATIBLE    Unit Number [id number]    Blood Component Type RED CELLS,LR    Unit division 00    Status of Unit ISSUED    Transfusion Status OK TO TRANSFUSE    Crossmatch Result COMPATIBLE    Unit Number [id number]    Blood Component Type RBC LR PHER1    Unit division 00    Status of Unit ISSUED    Transfusion Status OK TO TRANSFUSE    Crossmatch Result Compatible    Unit Number [id number]    Blood Component Type RED CELLS,LR    Unit division 00    Status of Unit ISSUED    Transfusion Status OK TO TRANSFUSE    Crossmatch Result Compatible    Unit Number [id number]    Blood Component Type RED CELLS,LR    Unit division 00    Status of Unit ISSUED    Transfusion Status OK TO TRANSFUSE    Crossmatch Result COMPATIBLE    Unit Number [id number]    Blood Component Type RED CELLS,LR    Unit division 00    Status of Unit ISSUED    Transfusion Status OK TO TRANSFUSE    Crossmatch Result COMPATIBLE    Unit Number [id number]    Blood Component Type RED CELLS,LR    Unit division 00    Status of Unit ISSUED    Transfusion Status OK TO TRANSFUSE    Crossmatch Result COMPATIBLE    Unit Number [id number]    Blood Component Type RED CELLS,LR    Unit division 00     Status of Unit ISSUED    Transfusion Status OK TO TRANSFUSE    Crossmatch Result COMPATIBLE    Unit Number [id number]    Blood Component Type RED CELLS,LR    Unit division 00    Status of Unit ISSUED    Transfusion Status OK TO TRANSFUSE    Crossmatch Result COMPATIBLE    Unit Number [id number]    Blood Component Type RED CELLS,LR    Unit division 00    Status of Unit ISSUED    Transfusion Status OK TO TRANSFUSE    Crossmatch Result COMPATIBLE    Unit Number [id number]    Blood Component Type RED CELLS,LR    Unit division 00    Status of Unit ISSUED    Transfusion Status OK TO TRANSFUSE    Crossmatch Result COMPATIBLE    Unit Number [id number]    Blood Component Type RED CELLS,LR    Unit division 00    Status of Unit ISSUED    Transfusion Status OK TO TRANSFUSE    Crossmatch Result COMPATIBLE    Unit Number [id number]    Blood Component Type RED CELLS,LR    Unit division 00    Status of Unit ISSUED    Transfusion Status OK TO TRANSFUSE    Crossmatch Result COMPATIBLE    Unit Number [id number]    Blood Component Type RED CELLS,LR    Unit division 00    Status of Unit ISSUED    Transfusion Status OK TO TRANSFUSE    Crossmatch Result COMPATIBLE    Unit Number [id number]    Blood Component Type RED CELLS,LR    Unit division 00    Status of Unit ISSUED    Transfusion Status OK  TO TRANSFUSE    Crossmatch Result COMPATIBLE    Unit Number [id number]    Blood Component Type RED CELLS,LR    Unit division 00    Status of Unit ISSUED    Transfusion Status OK TO TRANSFUSE    Crossmatch Result COMPATIBLE    Unit Number [id number]    Blood Component Type RED CELLS,LR    Unit division 00    Status of Unit ISSUED    Transfusion Status OK TO TRANSFUSE    Crossmatch Result COMPATIBLE    Unit Number [id number]    Blood Component Type RED CELLS,LR    Unit division 00    Status of Unit ISSUED    Transfusion Status OK TO TRANSFUSE    Crossmatch  Result COMPATIBLE    Unit Number [id number]    Blood Component Type RED CELLS,LR    Unit division 00    Status of Unit ISSUED    Transfusion Status OK TO TRANSFUSE    Crossmatch Result COMPATIBLE    Unit Number [id number]    Blood Component Type RED CELLS,LR    Unit division 00    Status of Unit ISSUED    Transfusion Status OK TO TRANSFUSE    Crossmatch Result COMPATIBLE    Unit Number [id number]    Blood Component Type RED CELLS,LR    Unit division 00    Status of Unit ALLOCATED    Transfusion Status OK TO TRANSFUSE    Crossmatch Result COMPATIBLE    Unit Number [id number]    Blood Component Type RED CELLS,LR    Unit division 00    Status of Unit ISSUED    Transfusion Status OK TO TRANSFUSE    Crossmatch Result COMPATIBLE    Unit Number [id number]    Blood Component Type RED CELLS,LR    Unit division 00    Status of Unit ALLOCATED    Transfusion Status OK TO TRANSFUSE    Crossmatch Result COMPATIBLE    Unit Number [id number]    Blood Component Type RED CELLS,LR    Unit division 00    Status of Unit ISSUED    Transfusion Status OK TO TRANSFUSE    Crossmatch Result COMPATIBLE    Unit Number [id number]    Blood Component Type RBC LR PHER2    Unit division 00    Status of Unit ALLOCATED    Transfusion Status OK TO TRANSFUSE    Crossmatch Result COMPATIBLE    Unit Number [id number]    Blood Component Type RBC LR PHER1    Unit division 00    Status of Unit ALLOCATED    Transfusion Status OK TO TRANSFUSE    Crossmatch Result COMPATIBLE   Comprehensive metabolic panel     Status: Abnormal   Collection Time: 01/27/23  2:55 AM  Result Value Ref Range   Sodium 136 135 - 145 mmol/L   Potassium 4.4 3.5 - 5.1 mmol/L   Chloride 103 98 - 111 mmol/L   CO2 28 22 - 32 mmol/L   Glucose, Bld 99 70 - 99 mg/dL    Comment: Glucose reference range applies only to samples taken after fasting for at least 8 hours.   BUN 36 (H) 8 - 23 mg/dL   Creatinine, Ser  6.27 0.61 - 1.24 mg/dL   Calcium 8.0 (L) 8.9 - 10.3 mg/dL   Total Protein 5.2 (L) 6.5 - 8.1 g/dL   Albumin 2.6 (L) 3.5 - 5.0 g/dL   AST 32 15 - 41  U/L   ALT 53 (H) 0 - 44 U/L   Alkaline Phosphatase 53 38 - 126 U/L   Total Bilirubin 0.5 0.3 - 1.2 mg/dL   GFR, Estimated >16 >10 mL/min    Comment: (NOTE) Calculated using the CKD-EPI Creatinine Equation (2021)    Anion gap 5 5 - 15    Comment: Performed at [facility], [address], [place], [place] [address]  Magnesium     Status: None   Collection Time: 01/27/23  2:55 AM  Result Value Ref Range   Magnesium 2.0 1.7 - 2.4 mg/dL    Comment: Performed at [facility], [address], [place], [place] [address]  Phosphorus     Status: None   Collection Time: 01/27/23  2:55 AM  Result Value Ref Range   Phosphorus 3.1 2.5 - 4.6 mg/dL    Comment: Performed at [facility], [address], [place], [place] [address]  Triglycerides     Status: None   Collection Time: 01/27/23  2:55 AM  Result Value Ref Range   Triglycerides 23 <150 mg/dL    Comment: Performed at [facility], [address], [place], [place] [address]  CBC     Status: Abnormal   Collection Time: 01/27/23  2:55 AM  Result Value Ref Range   WBC 4.6 4.0 - 10.5 K/uL   RBC 3.39 (L) 4.22 - 5.81 MIL/uL   Hemoglobin 10.4 (L) 13.0 - 17.0 g/dL   HCT 21.3 (L) 08.6 - 57.8 %   MCV 96.8 80.0 - 100.0 fL   MCH 30.7 26.0 - 34.0 pg   MCHC 31.7 30.0 - 36.0 g/dL   RDW 46.9 62.9 - 52.8 %   Platelets 134 (L) 150 - 400 K/uL   nRBC 0.0 0.0 - 0.2 %    Comment: Performed at [facility], [address], [place], [place] [address]  Glucose, capillary     Status: None   Collection Time: 01/27/23  8:18 AM  Result Value Ref Range   Glucose-Capillary 87 70 - 99 mg/dL    Comment: Glucose reference range applies only to samples taken after fasting for at least 8 hours.  I-STAT, chem 8     Status:  Abnormal   Collection Time: 01/27/23  1:59 PM  Result Value Ref Range   Sodium 137 135 - 145 mmol/L   Potassium 4.9 3.5 - 5.1 mmol/L   Chloride 104 98 - 111 mmol/L   BUN 31 (H) 8 - 23 mg/dL   Creatinine, Ser 4.01 0.61 - 1.24 mg/dL   Glucose, Bld 027 (H) 70 - 99 mg/dL    Comment: Glucose reference range applies only to samples taken after fasting for at least 8 hours.   Calcium, Ion 1.13 (L) 1.15 - 1.40 mmol/L   TCO2 23 22 - 32 mmol/L   Hemoglobin 6.1 (LL) 13.0 - 17.0 g/dL   HCT 25.3 (L) 66.4 - 40.3 %  Prepare RBC (crossmatch)     Status: None   Collection Time: 01/27/23  2:18 PM  Result Value Ref Range   Order Confirmation      ORDER PROCESSED BY BLOOD BANK Performed at [facility], [address] [place], [place] [address]   I-STAT 7, (LYTES, BLD GAS, ICA, H+H)     Status: Abnormal   Collection Time: 01/27/23  2:51 PM  Result Value Ref Range   pH, Arterial 7.288 (L) 7.35 - 7.45  pCO2 arterial 40.9 32 - 48 mmHg   pO2, Arterial 458 (H) 83 - 108 mmHg   Bicarbonate 19.9 (L) 20.0 - 28.0 mmol/L   TCO2 21 (L) 22 - 32 mmol/L   O2 Saturation 100 %   Acid-base deficit 6.0 (H) 0.0 - 2.0 mmol/L   Sodium 138 135 - 145 mmol/L   Potassium 4.4 3.5 - 5.1 mmol/L   Calcium, Ion 1.13 (L) 1.15 - 1.40 mmol/L   HCT 16.0 (L) 39.0 - 52.0 %   Hemoglobin 5.4 (LL) 13.0 - 17.0 g/dL   Patient temperature 16.1 C    Collection site RADIAL, ALLEN'S TEST ACCEPTABLE    Drawn by HIDE    Sample type ARTERIAL    Comment NOTIFIED PHYSICIAN   I-STAT, chem 8     Status: Abnormal   Collection Time: 01/27/23  4:10 PM  Result Value Ref Range   Sodium 138 135 - 145 mmol/L   Potassium 4.7 3.5 - 5.1 mmol/L   Chloride 106 98 - 111 mmol/L   BUN 31 (H) 8 - 23 mg/dL   Creatinine, Ser 0.96 (L) 0.61 - 1.24 mg/dL   Glucose, Bld 045 (H) 70 - 99 mg/dL    Comment: Glucose reference range applies only to samples taken after fasting for at least 8 hours.   Calcium, Ion 1.10 (L) 1.15 - 1.40 mmol/L    TCO2 23 22 - 32 mmol/L   Hemoglobin 8.5 (L) 13.0 - 17.0 g/dL   HCT 40.9 (L) 81.1 - 91.4 %  I-STAT 7, (LYTES, BLD GAS, ICA, H+H)     Status: Abnormal   Collection Time: 01/27/23  5:19 PM  Result Value Ref Range   pH, Arterial 7.138 (LL) 7.35 - 7.45   pCO2 arterial 43.3 32 - 48 mmHg   pO2, Arterial 419 (H) 83 - 108 mmHg   Bicarbonate 14.7 (L) 20.0 - 28.0 mmol/L   TCO2 16 (L) 22 - 32 mmol/L   O2 Saturation 100 %   Acid-base deficit 13.0 (H) 0.0 - 2.0 mmol/L   Sodium 143 135 - 145 mmol/L   Potassium 3.5 3.5 - 5.1 mmol/L   Calcium, Ion 1.02 (L) 1.15 - 1.40 mmol/L   HCT 21.0 (L) 39.0 - 52.0 %   Hemoglobin 7.1 (L) 13.0 - 17.0 g/dL   Sample type ARTERIAL   Prepare fresh frozen plasma     Status: None (Preliminary result)   Collection Time: 01/27/23  5:23 PM  Result Value Ref Range   Unit Number [id number]    Blood Component Type THW PLS APHR    Unit division A0    Status of Unit ISSUED    Transfusion Status OK TO TRANSFUSE    Unit Number [id number]    Blood Component Type THW PLS APHR    Unit division B0    Status of Unit ISSUED    Transfusion Status OK TO TRANSFUSE   Prepare platelet pheresis     Status: None (Preliminary result)   Collection Time: 01/27/23  5:26 PM  Result Value Ref Range   Unit Number [id number]    Blood Component Type PLTP2 PSORALEN TREATED    Unit division 00    Status of Unit ISSUED    Transfusion Status OK TO TRANSFUSE   CBC     Status: Abnormal   Collection Time: 01/27/23  5:28 PM  Result Value Ref Range   WBC 7.7 4.0 - 10.5 K/uL   RBC 3.12 (L) 4.22 - 5.81 MIL/uL   Hemoglobin  9.2 (L) 13.0 - 17.0 g/dL   HCT 46.9 (L) 62.9 - 52.8 %   MCV 92.3 80.0 - 100.0 fL   MCH 29.5 26.0 - 34.0 pg   MCHC 31.9 30.0 - 36.0 g/dL   RDW 41.3 (H) 24.4 - 01.0 %   Platelets 101 (L) 150 - 400 K/uL    Comment: Immature Platelet Fraction may be clinically indicated, consider ordering this additional test [id number] REPEATED TO VERIFY SPECIMEN CHECKED FOR  CLOTS PLATELET COUNT CONFIRMED BY SMEAR    nRBC 0.0 0.0 - 0.2 %    Comment: Performed at [facility], [address] [place], [place] [address]  Basic metabolic panel     Status: Abnormal   Collection Time: 01/27/23  5:28 PM  Result Value Ref Range   Sodium 138 135 - 145 mmol/L   Potassium 5.2 (H) 3.5 - 5.1 mmol/L   Chloride 109 98 - 111 mmol/L   CO2 22 22 - 32 mmol/L   Glucose, Bld 430 (H) 70 - 99 mg/dL    Comment: Glucose reference range applies only to samples taken after fasting for at least 8 hours.   BUN 38 (H) 8 - 23 mg/dL   Creatinine, Ser 4.25 0.61 - 1.24 mg/dL   Calcium 7.0 (L) 8.9 - 10.3 mg/dL   GFR, Estimated >95 >63 mL/min    Comment: (NOTE) Calculated using the CKD-EPI Creatinine Equation (2021)    Anion gap 7 5 - 15    Comment: Performed at [facility], [address], [place], [place] [address]  Protime-INR     Status: Abnormal   Collection Time: 01/27/23  5:28 PM  Result Value Ref Range   Prothrombin Time 19.2 (H) 11.4 - 15.2 seconds   INR 1.6 (H) 0.8 - 1.2    Comment: (NOTE) INR goal varies based on device and disease states. Performed at [facility], [address], [place], [place] [address]   Lactic acid, plasma     Status: Abnormal   Collection Time: 01/27/23  5:28 PM  Result Value Ref Range   Lactic Acid, Venous 4.0 (HH) 0.5 - 1.9 mmol/L    Comment: CRITICAL RESULT CALLED TO, READ BACK BY AND VERIFIED WITH RN B MAY AT 1904 01/27/23 CRUICKSHANK A Performed at [facility], [address] [place], [place] [address]   Troponin I (High Sensitivity)     Status: None   Collection Time: 01/27/23  5:28 PM  Result Value Ref Range   Troponin I (High Sensitivity) 6 <18 ng/L    Comment: (NOTE) Elevated high sensitivity troponin I (hsTnI) values and significant  changes across serial measurements may suggest ACS but many other  chronic and acute conditions are known to elevate  hsTnI results.  Refer to the "Links" section for chest pain algorithms and additional  guidance. Performed at [facility], [address], [place], [place] [address]   Blood gas, arterial     Status: Abnormal   Collection Time: 01/27/23  5:45 PM  Result Value Ref Range   FIO2 100 %   Delivery systems VENTILATOR    Mode PRESSURE REGULATED VOLUME CONTROL    MECHVT 630 mL   RATE 15 resp/min   pH, Arterial 7.23 (L) 7.35 - 7.45   pCO2 arterial 53 (H) 32 - 48 mmHg   pO2, Arterial 344 (H) 83 - 108 mmHg   Bicarbonate 22.2 20.0 - 28.0 mmol/L   Acid-base deficit 5.8 (H)  0.0 - 2.0 mmol/L   O2 Saturation 100 %   Patient temperature 37.0    Collection site A-LINE    Drawn by [id number]    Allens test (pass/fail) PASS PASS    Comment: Performed at [facility], [address] [place], [place] [address]  Prepare RBC (crossmatch)     Status: None   Collection Time: 01/27/23  5:56 PM  Result Value Ref Range   Order Confirmation      ORDER PROCESSED BY BLOOD BANK Performed at [facility], [address], [place], [place] [address]   Prepare cryoprecipitate     Status: None (Preliminary result)   Collection Time: 01/27/23  6:35 PM  Result Value Ref Range   Unit Number [id number]    Blood Component Type CRYO,THAWED    Unit division 00    Status of Unit ISSUED    Transfusion Status OK TO TRANSFUSE   Glucose, capillary     Status: Abnormal   Collection Time: 01/27/23  7:14 PM  Result Value Ref Range   Glucose-Capillary 302 (H) 70 - 99 mg/dL    Comment: Glucose reference range applies only to samples taken after fasting for at least 8 hours.  Lactic acid, plasma     Status: Abnormal   Collection Time: 01/27/23  7:50 PM  Result Value Ref Range   Lactic Acid, Venous 6.1 (HH) 0.5 - 1.9 mmol/L    Comment: CRITICAL RESULT CALLED TO, READ BACK BY AND VERIFIED WITH Shelly Coss, RN 01/27/23 2049 BY K. DAVIS Performed at [facility], [address] [place], [place] [address]   CBC with Differential/Platelet     Status: Abnormal   Collection Time: 01/27/23  7:50 PM  Result Value Ref Range   WBC 6.8 4.0 - 10.5 K/uL   RBC 2.83 (L) 4.22 - 5.81 MIL/uL   Hemoglobin 8.4 (L) 13.0 - 17.0 g/dL   HCT 32.4 (L) 40.1 - 02.7 %   MCV 97.9 80.0 - 100.0 fL   MCH 29.7 26.0 - 34.0 pg   MCHC 30.3 30.0 - 36.0 g/dL   RDW 25.3 (H) 66.4 - 40.3 %   Platelets 119 (L) 150 - 400 K/uL   nRBC 0.0 0.0 - 0.2 %   Neutrophils Relative % 81 %   Neutro Abs 5.5 1.7 - 7.7 K/uL   Lymphocytes Relative 11 %   Lymphs Abs 0.7 0.7 - 4.0 K/uL   Monocytes Relative 7 %   Monocytes Absolute 0.4 0.1 - 1.0 K/uL   Eosinophils Relative 0 %   Eosinophils Absolute 0.0 0.0 - 0.5 K/uL   Basophils Relative 0 %   Basophils Absolute 0.0 0.0 - 0.1 K/uL   WBC Morphology INCREASED BANDS (>20% BANDS)     Comment: Mild Left Shift (1-5% metas, occ myelo) TOXIC GRANULATION VACUOLATED NEUTROPHILS    Immature Granulocytes 1 %   Abs Immature Granulocytes 0.08 (H) 0.00 - 0.07 K/uL   Burr Cells PRESENT     Comment: Performed at [facility], [address], [place], [place] [address]  Basic metabolic panel     Status: Abnormal   Collection Time: 01/27/23  7:50 PM  Result Value Ref Range   Sodium 124 (L) 135 - 145 mmol/L   Potassium 3.6 3.5 - 5.1 mmol/L   Chloride 96 (L) 98 - 111 mmol/L   CO2 19 (L) 22 - 32 mmol/L   Glucose, Bld 844 (HH) 70 - 99 mg/dL  Comment: CRITICAL RESULT CALLED TO, READ BACK BY AND VERIFIED WITH T. Gaynelle Adu, RN 01/27/23 2046 BY K. DAVIS Glucose reference range applies only to samples taken after fasting for at least 8 hours.    BUN 31 (H) 8 - 23 mg/dL   Creatinine, Ser 1.61 0.61 - 1.24 mg/dL   Calcium 5.9 (LL) 8.9 - 10.3 mg/dL    Comment: CRITICAL RESULT CALLED TO, READ BACK BY AND VERIFIED WITH T. NEILSON, RN 01/27/23 1047 BY K. DAVIS   GFR, Estimated >60 >60 mL/min    Comment: (NOTE) Calculated using the  CKD-EPI Creatinine Equation (2021)    Anion gap 9 5 - 15    Comment: Performed at [facility], [address] [place], [place] [address]  Blood gas, arterial     Status: Abnormal   Collection Time: 01/27/23  8:05 PM  Result Value Ref Range   O2 Content 70.0 L/min   Mode PRESSURE REGULATED VOLUME CONTROL    MECHVT 630 mL   RATE 20 resp/min   PEEP 5 cm H20   pH, Arterial 7.23 (L) 7.35 - 7.45   pCO2 arterial 50 (H) 32 - 48 mmHg   pO2, Arterial 272 (H) 83 - 108 mmHg   Bicarbonate 20.9 20.0 - 28.0 mmol/L   Acid-base deficit 7.1 (H) 0.0 - 2.0 mmol/L   O2 Saturation 99.4 %   Patient temperature 36.4     Comment: Performed at [facility], [address], [place], [place] [address]  Glucose, capillary     Status: Abnormal   Collection Time: 01/27/23  9:07 PM  Result Value Ref Range   Glucose-Capillary 277 (H) 70 - 99 mg/dL    Comment: Glucose reference range applies only to samples taken after fasting for at least 8 hours.  Glucose, random     Status: Abnormal   Collection Time: 01/27/23  9:13 PM  Result Value Ref Range   Glucose, Bld 819 (HH) 70 - 99 mg/dL    Comment: CRITICAL RESULT CALLED TO, READ BACK BY AND VERIFIED WITH S. CALDER, RN 01/27/23 2143 BY K. DAVIS Glucose reference range applies only to samples taken after fasting for at least 8 hours. Performed at [facility], [address], [place], [place] [address]   Basic metabolic panel     Status: Abnormal   Collection Time: 01/27/23 10:07 PM  Result Value Ref Range   Sodium 138 135 - 145 mmol/L    Comment: DELTA CHECK NOTED   Potassium 3.8 3.5 - 5.1 mmol/L   Chloride 106 98 - 111 mmol/L   CO2 23 22 - 32 mmol/L   Glucose, Bld 397 (H) 70 - 99 mg/dL    Comment: Glucose reference range applies only to samples taken after fasting for at least 8 hours.   BUN 40 (H) 8 - 23 mg/dL   Creatinine, Ser 7.82 0.61 - 1.24 mg/dL   Calcium 6.5 (L) 8.9 - 10.3 mg/dL   GFR,  Estimated >95 >62 mL/min    Comment: (NOTE) Calculated using the CKD-EPI Creatinine Equation (2021)    Anion gap 9 5 - 15    Comment: Performed at [facility], [address] [place], [place] [address]  Prepare fresh frozen plasma     Status: None (Preliminary result)   Collection Time: 01/27/23 10:21 PM  Result Value Ref Range   Unit Number [id number]    Blood Component Type LIQ PLASMA    Unit division 00  Status of Unit ISSUED    Transfusion Status OK TO TRANSFUSE    Unit Number [id number]    Blood Component Type LIQ PLASMA    Unit division 00    Status of Unit ISSUED    Transfusion Status OK TO TRANSFUSE    Unit Number [id number]    Blood Component Type LIQ PLASMA    Unit division 00    Status of Unit ISSUED    Transfusion Status OK TO TRANSFUSE    Unit Number [id number]    Blood Component Type LIQ PLASMA    Unit division 00    Status of Unit ISSUED    Transfusion Status      OK TO TRANSFUSE Performed at [facility], [address], [place], [place] [address]    Unit Number [id number]    Blood Component Type LIQ PLASMA    Unit division 00    Status of Unit ISSUED    Transfusion Status OK TO TRANSFUSE   Prepare platelet pheresis     Status: None (Preliminary result)   Collection Time: 01/27/23 10:23 PM  Result Value Ref Range   Unit Number [id number]    Blood Component Type PSORALEN TREATED    Unit division 00    Status of Unit ISSUED    Transfusion Status OK TO TRANSFUSE   CBC     Status: Abnormal   Collection Time: 01/28/23 12:20 AM  Result Value Ref Range   WBC 5.6 4.0 - 10.5 K/uL   RBC 2.01 (L) 4.22 - 5.81 MIL/uL   Hemoglobin 6.0 (LL) 13.0 - 17.0 g/dL    Comment: REPEATED TO VERIFY THIS CRITICAL RESULT HAS VERIFIED AND BEEN CALLED TO DR Freida Busman, DR BY ATCHISON,MARY ON 04 23 2024 AT 0132, AND HAS BEEN READ BACK.     HCT 18.1 (L) 39.0 - 52.0 %   MCV 90.0 80.0 - 100.0 fL   MCH 29.9 26.0 - 34.0 pg    MCHC 33.1 30.0 - 36.0 g/dL   RDW 38.7 (H) 56.4 - 33.2 %   Platelets 101 (L) 150 - 400 K/uL    Comment: Immature Platelet Fraction may be clinically indicated, consider ordering this additional test [id number]    nRBC 0.0 0.0 - 0.2 %    Comment: Performed at [facility], [address], [place], [place] [address]  Prepare RBC (crossmatch)     Status: None   Collection Time: 01/28/23 12:20 AM  Result Value Ref Range   Order Confirmation      ORDER PROCESSED BY BLOOD BANK Performed at [facility], [address] [place], [place] [address]   DIC Panel ONCE - STAT     Status: Abnormal   Collection Time: 01/28/23 12:20 AM  Result Value Ref Range   Prothrombin Time 20.0 (H) 11.4 - 15.2 seconds   INR 1.7 (H) 0.8 - 1.2    Comment: (NOTE) INR goal varies based on device and disease states.    aPTT 40 (H) 24 - 36 seconds    Comment:        IF BASELINE aPTT IS ELEVATED, SUGGEST PATIENT RISK ASSESSMENT BE USED TO DETERMINE APPROPRIATE ANTICOAGULANT THERAPY.    Fibrinogen 178 (L) 210 - 475 mg/dL    Comment: (NOTE) Fibrinogen results may be underestimated in patients receiving thrombolytic therapy.    D-Dimer, Quant 8.36 (H) 0.00 - 0.50 ug/mL-FEU    Comment: (NOTE) At the manufacturer cut-off value of 0.5 g/mL FEU, this assay  has a negative predictive value of 95-100%.This assay is intended for use in conjunction with a clinical pretest probability (PTP) assessment model to exclude pulmonary embolism (PE) and deep venous thrombosis (DVT) in outpatients suspected of PE or DVT. Results should be correlated with clinical presentation.    Platelets 100 (L) 150 - 400 K/uL    Comment: Immature Platelet Fraction may be clinically indicated, consider ordering this additional test [id number]    Smear Review NO SCHISTOCYTES SEEN     Comment: Performed at [facility], [address] [place], [place] [address]  Glucose, capillary      Status: Abnormal   Collection Time: 01/28/23 12:26 AM  Result Value Ref Range   Glucose-Capillary 306 (H) 70 - 99 mg/dL    Comment: Glucose reference range applies only to samples taken after fasting for at least 8 hours.  Blood gas, arterial     Status: Abnormal   Collection Time: 01/28/23 12:27 AM  Result Value Ref Range   FIO2 40 %   Mode PRESSURE REGULATED VOLUME CONTROL    MECHVT 630 mL   RATE 27 resp/min   PEEP 5 cm H20   pH, Arterial 7.37 7.35 - 7.45   pCO2 arterial 39 32 - 48 mmHg   pO2, Arterial 153 (H) 83 - 108 mmHg   Bicarbonate 22.6 20.0 - 28.0 mmol/L   Acid-base deficit 2.7 (H) 0.0 - 2.0 mmol/L   O2 Saturation 100 %   Patient temperature 36.5    Collection site A-LINE    Drawn by [phone]     Comment: Performed at [facility], [address], [place], [place] [address]  Prepare fresh frozen plasma     Status: None (Preliminary result)   Collection Time: 01/28/23 12:46 AM  Result Value Ref Range   Unit Number [id number]    Blood Component Type THW PLS APHR    Unit division A0    Status of Unit ISSUED    Transfusion Status      OK TO TRANSFUSE Performed at [facility], [address], [place], [place] [address]   Prepare cryoprecipitate     Status: None (Preliminary result)   Collection Time: 01/28/23 12:46 AM  Result Value Ref Range   Unit Number [id number]    Blood Component Type CRYO,THAWED    Unit division 00    Status of Unit ISSUED    Transfusion Status OK TO TRANSFUSE    Unit Number [id number]    Blood Component Type CRYO,THAWED    Unit division 00    Status of Unit ISSUED    Transfusion Status OK TO TRANSFUSE    Unit Number [id number]    Blood Component Type CRYO,THAWED    Unit division 00    Status of Unit ISSUED    Transfusion Status OK TO TRANSFUSE    Unit Number [id number]    Blood Component Type CRYO,THAWED    Unit division 00    Status of Unit ALLOCATED    Transfusion Status OK TO  TRANSFUSE   Glucose, capillary     Status: Abnormal   Collection Time: 01/28/23  1:31 AM  Result Value Ref Range   Glucose-Capillary 274 (H) 70 - 99 mg/dL    Comment: Glucose reference range applies only to samples taken after fasting for at least 8 hours.  Prepare platelet pheresis     Status: None (Preliminary result)   Collection Time: 01/28/23  2:00 AM  Result Value Ref Range  Unit Number [id number]    Blood Component Type PLTP1 PSORALEN TREATED    Unit division 00    Status of Unit ISSUED    Transfusion Status      OK TO TRANSFUSE Performed at [facility], [address], [place], [place] [address]    Unit Number [id number]    Blood Component Type PLTP1 PSORALEN TREATED    Unit division 00    Status of Unit ISSUED    Transfusion Status OK TO TRANSFUSE    Unit Number [id number]    Blood Component Type PLTP1 PSORALEN TREATED    Unit division 00    Status of Unit ALLOCATED    Transfusion Status OK TO TRANSFUSE   Basic metabolic panel     Status: Abnormal   Collection Time: 01/28/23  2:30 AM  Result Value Ref Range   Sodium 141 135 - 145 mmol/L   Potassium 3.7 3.5 - 5.1 mmol/L   Chloride 112 (H) 98 - 111 mmol/L   CO2 12 (L) 22 - 32 mmol/L   Glucose, Bld 379 (H) 70 - 99 mg/dL    Comment: Glucose reference range applies only to samples taken after fasting for at least 8 hours.   BUN 34 (H) 8 - 23 mg/dL   Creatinine, Ser 1.02 0.61 - 1.24 mg/dL   Calcium 6.4 (LL) 8.9 - 10.3 mg/dL    Comment: CRITICAL RESULT CALLED TO, READ BACK BY AND VERIFIED WITH SALAS,B RN @0331  01/28/23 BY CHILDRESS,E   GFR, Estimated >60 >60 mL/min    Comment: (NOTE) Calculated using the CKD-EPI Creatinine Equation (2021)    Anion gap 17 (H) 5 - 15    Comment: Performed at [facility], [address], [place], [place] [address]  Prepare RBC (crossmatch)     Status: None   Collection Time: 01/28/23  3:01 AM  Result Value Ref Range   Order Confirmation       BB SAMPLE OR UNITS ALREADY AVAILABLE Performed at [facility], [address] [place], [place] [address]   Initiate MTP (Blood Bank Notification)     Status: None   Collection Time: 01/28/23  3:15 AM  Result Value Ref Range   Initiate Massive Transfusion Protocol      MTP ORDER RECEIVED Performed at [facility], [address], [place], [place] [address]   Glucose, capillary     Status: Abnormal   Collection Time: 01/28/23  3:21 AM  Result Value Ref Range   Glucose-Capillary 325 (H) 70 - 99 mg/dL    Comment: Glucose reference range applies only to samples taken after fasting for at least 8 hours.  Prepare RBC     Status: None   Collection Time: 01/28/23  3:21 AM  Result Value Ref Range   Order Confirmation      ORDER PROCESSED BY BLOOD BANK Performed at [facility], [address] [place], [place] [address]   Prepare fresh frozen plasma     Status: None (Preliminary result)   Collection Time: 01/28/23  3:22 AM  Result Value Ref Range   Unit Number [id number]    Blood Component Type THW PLS APHR    Unit division 00    Status of Unit ISSUED    Transfusion Status OK TO TRANSFUSE    Unit Number [id number]    Blood Component Type THW PLS APHR    Unit division B0    Status of Unit ISSUED  Transfusion Status OK TO TRANSFUSE    Unit Number [id number]    Blood Component Type THW PLS APHR    Unit division 00    Status of Unit ISSUED    Transfusion Status      OK TO TRANSFUSE Performed at [facility], [address], [place], [place] [address]    Unit Number [id number]    Blood Component Type THW PLS APHR    Unit division 00    Status of Unit ISSUED    Transfusion Status OK TO TRANSFUSE    Unit Number [id number]    Blood Component Type THW PLS APHR    Unit division A0    Status of Unit ISSUED    Transfusion Status OK TO TRANSFUSE    Unit Number [id number]    Blood  Component Type THW PLS APHR    Unit division 00    Status of Unit ISSUED    Transfusion Status OK TO TRANSFUSE    Unit Number [id number]    Blood Component Type THW PLS APHR    Unit division A0    Status of Unit ISSUED    Transfusion Status OK TO TRANSFUSE    Unit Number [id number]    Blood Component Type THW PLS APHR    Unit division 00    Status of Unit ISSUED    Transfusion Status OK TO TRANSFUSE    Unit Number [id number]    Blood Component Type THW PLS APHR    Unit division B0    Status of Unit ISSUED    Transfusion Status OK TO TRANSFUSE    Unit Number [id number]    Blood Component Type THW PLS APHR    Unit division B0    Status of Unit ISSUED    Transfusion Status OK TO TRANSFUSE    Unit Number [id number]    Blood Component Type THW PLS APHR    Unit division 00    Status of Unit ISSUED    Transfusion Status OK TO TRANSFUSE    Unit Number [id number]    Blood Component Type THW PLS APHR    Unit division 00    Status of Unit ISSUED    Transfusion Status OK TO TRANSFUSE   I-STAT 7, (LYTES, BLD GAS, ICA, H+H)     Status: Abnormal   Collection Time: 01/28/23  4:48 AM  Result Value Ref Range   pH, Arterial 7.067 (LL) 7.35 - 7.45   pCO2 arterial 61.1 (H) 32 - 48 mmHg   pO2, Arterial 494 (H) 83 - 108 mmHg   Bicarbonate 17.6 (L) 20.0 - 28.0 mmol/L   TCO2 19 (L) 22 - 32 mmol/L   O2 Saturation 100 %   Acid-base deficit 12.0 (H) 0.0 - 2.0 mmol/L   Sodium 143 135 - 145 mmol/L   Potassium 4.2 3.5 - 5.1 mmol/L   Calcium, Ion 0.65 (LL) 1.15 - 1.40 mmol/L   HCT 26.0 (L) 39.0 - 52.0 %   Hemoglobin 8.8 (L) 13.0 - 17.0 g/dL   Sample type ARTERIAL   I-STAT 7, (LYTES, BLD GAS, ICA, H+H)     Status: Abnormal   Collection Time: 01/28/23  5:07 AM  Result Value Ref Range   pH, Arterial 7.189 (LL) 7.35 - 7.45   pCO2 arterial 53.1 (H) 32 - 48 mmHg   pO2, Arterial 476 (H) 83 - 108 mmHg   Bicarbonate 20.2 20.0 - 28.0 mmol/L   TCO2 22 22 - 32 mmol/L   O2  Saturation  100 %   Acid-base deficit 8.0 (H) 0.0 - 2.0 mmol/L   Sodium 143 135 - 145 mmol/L   Potassium 4.1 3.5 - 5.1 mmol/L   Calcium, Ion 0.92 (L) 1.15 - 1.40 mmol/L   HCT 26.0 (L) 39.0 - 52.0 %   Hemoglobin 8.8 (L) 13.0 - 17.0 g/dL   Sample type ARTERIAL   Prepare fresh frozen plasma     Status: None (Preliminary result)   Collection Time: 01/28/23  5:47 AM  Result Value Ref Range   Unit Number [id number]    Blood Component Type THW PLS APHR    Unit division 00    Status of Unit ISSUED    Transfusion Status OK TO TRANSFUSE    Unit Number [id number]    Blood Component Type THW PLS APHR    Unit division A0    Status of Unit ALLOCATED    Transfusion Status OK TO TRANSFUSE    Unit Number [id number]    Blood Component Type THW PLS APHR    Unit division A0    Status of Unit ISSUED    Transfusion Status      OK TO TRANSFUSE Performed at [facility], [address] [place], [place] [address]   CBC     Status: Abnormal   Collection Time: 01/28/23  5:50 AM  Result Value Ref Range   WBC 5.0 4.0 - 10.5 K/uL   RBC 4.21 (L) 4.22 - 5.81 MIL/uL   Hemoglobin 12.1 (L) 13.0 - 17.0 g/dL    Comment: REPEATED TO VERIFY POST TRANSFUSION SPECIMEN DELTA CHECK NOTED    HCT 37.4 (L) 39.0 - 52.0 %   MCV 88.8 80.0 - 100.0 fL   MCH 28.7 26.0 - 34.0 pg   MCHC 32.4 30.0 - 36.0 g/dL   RDW 25.3 66.4 - 40.3 %   Platelets 44 (L) 150 - 400 K/uL    Comment: Immature Platelet Fraction may be clinically indicated, consider ordering this additional test [id number] DELTA CHECK NOTED    nRBC 0.4 (H) 0.0 - 0.2 %    Comment: Performed at [facility], [address], [place], [place] [address]  Comprehensive metabolic panel     Status: Abnormal   Collection Time: 01/28/23  5:50 AM  Result Value Ref Range   Sodium 140 135 - 145 mmol/L   Potassium 3.3 (L) 3.5 - 5.1 mmol/L   Chloride 103 98 - 111 mmol/L   CO2 17 (L) 22 - 32 mmol/L   Glucose, Bld 382 (H) 70 - 99  mg/dL    Comment: Glucose reference range applies only to samples taken after fasting for at least 8 hours.   BUN 33 (H) 8 - 23 mg/dL   Creatinine, Ser 4.33 (H) 0.61 - 1.24 mg/dL   Calcium 7.9 (L) 8.9 - 10.3 mg/dL   Total Protein <2.9 (L) 6.5 - 8.1 g/dL   Albumin 1.6 (L) 3.5 - 5.0 g/dL   AST 518 (H) 15 - 41 U/L   ALT 435 (H) 0 - 44 U/L   Alkaline Phosphatase 27 (L) 38 - 126 U/L   Total Bilirubin 0.8 0.3 - 1.2 mg/dL   GFR, Estimated 56 (L) >60 mL/min    Comment: (NOTE) Calculated using the CKD-EPI Creatinine Equation (2021)    Anion gap 20 (H) 5 - 15    Comment: Performed at [facility], [address] [place], [place] [address]  DIC Panel ONCE - STAT  Status: Abnormal   Collection Time: 01/28/23  5:50 AM  Result Value Ref Range   Prothrombin Time 20.1 (H) 11.4 - 15.2 seconds   INR 1.7 (H) 0.8 - 1.2    Comment: (NOTE) INR goal varies based on device and disease states.    aPTT 49 (H) 24 - 36 seconds    Comment:        IF BASELINE aPTT IS ELEVATED, SUGGEST PATIENT RISK ASSESSMENT BE USED TO DETERMINE APPROPRIATE ANTICOAGULANT THERAPY.    Fibrinogen 168 (L) 210 - 475 mg/dL    Comment: (NOTE) Fibrinogen results may be underestimated in patients receiving thrombolytic therapy.    D-Dimer, Quant 5.63 (H) 0.00 - 0.50 ug/mL-FEU    Comment: (NOTE) At the manufacturer cut-off value of 0.5 g/mL FEU, this assay has a negative predictive value of 95-100%.This assay is intended for use in conjunction with a clinical pretest probability (PTP) assessment model to exclude pulmonary embolism (PE) and deep venous thrombosis (DVT) in outpatients suspected of PE or DVT. Results should be correlated with clinical presentation.    Platelets 46 (L) 150 - 400 K/uL    Comment: SPECIMEN CHECKED FOR CLOTS Immature Platelet Fraction may be clinically indicated, consider ordering this additional test [id number] REPEATED TO VERIFY    Smear Review NO SCHISTOCYTES SEEN      Comment: Performed at [facility], [address] [place], [place] [address]  Blood gas, arterial     Status: Abnormal   Collection Time: 01/28/23  5:55 AM  Result Value Ref Range   FIO2 100 %   MECHVT 630 mL   RATE 24 resp/min   PEEP 5 cm H20   pH, Arterial 7.17 (LL) 7.35 - 7.45    Comment: CRITICAL RESULT CALLED TO, READ BACK BY AND VERIFIED WITH: HAWKINS,E RN @0609  01/28/23 BY CHILDRESS,E    pCO2 arterial 45 32 - 48 mmHg   pO2, Arterial 395 (H) 83 - 108 mmHg   Bicarbonate 16.4 (L) 20.0 - 28.0 mmol/L   Acid-base deficit 12.1 (H) 0.0 - 2.0 mmol/L   O2 Saturation 100 %   Patient temperature 36.3    Collection site A-LINE    Drawn by [phone]     Comment: Performed at [facility], [address], [place], [place] [address]  Glucose, capillary     Status: Abnormal   Collection Time: 01/28/23  6:16 AM  Result Value Ref Range   Glucose-Capillary 333 (H) 70 - 99 mg/dL    Comment: Glucose reference range applies only to samples taken after fasting for at least 8 hours.  Glucose, capillary     Status: Abnormal   Collection Time: 01/28/23  7:50 AM  Result Value Ref Range   Glucose-Capillary 210 (H) 70 - 99 mg/dL    Comment: Glucose reference range applies only to samples taken after fasting for at least 8 hours.   Comment 1 Notify RN    Comment 2 Document in Chart    Comment 3 Glucose Stabilizer   Glucose, capillary     Status: Abnormal   Collection Time: 01/28/23  8:54 AM  Result Value Ref Range   Glucose-Capillary 283 (H) 70 - 99 mg/dL    Comment: Glucose reference range applies only to samples taken after fasting for at least 8 hours.  CBC with Differential/Platelet     Status: Abnormal   Collection Time: 01/28/23  9:23 AM  Result Value Ref Range   WBC 3.2 (L) 4.0 - 10.5 K/uL  RBC 4.69 4.22 - 5.81 MIL/uL   Hemoglobin 13.6 13.0 - 17.0 g/dL   HCT 16.1 09.6 - 04.5 %   MCV 86.8 80.0 - 100.0 fL   MCH 29.0 26.0 - 34.0 pg   MCHC 33.4 30.0 -  36.0 g/dL   RDW 40.9 (H) 81.1 - 91.4 %   Platelets 33 (L) 150 - 400 K/uL    Comment: Immature Platelet Fraction may be clinically indicated, consider ordering this additional test [id number] CONSISTENT WITH PREVIOUS RESULT    nRBC 0.0 0.0 - 0.2 %   Neutrophils Relative % 85 %   Neutro Abs 2.7 1.7 - 7.7 K/uL   Lymphocytes Relative 10 %   Lymphs Abs 0.3 (L) 0.7 - 4.0 K/uL   Monocytes Relative 3 %   Monocytes Absolute 0.1 0.1 - 1.0 K/uL   Eosinophils Relative 0 %   Eosinophils Absolute 0.0 0.0 - 0.5 K/uL   Basophils Relative 1 %   Basophils Absolute 0.0 0.0 - 0.1 K/uL   Immature Granulocytes 1 %   Abs Immature Granulocytes 0.04 0.00 - 0.07 K/uL    Comment: Performed at [facility], [address] [place], [place] [address]  Protime-INR     Status: Abnormal   Collection Time: 01/28/23  9:23 AM  Result Value Ref Range   Prothrombin Time 16.9 (H) 11.4 - 15.2 seconds   INR 1.4 (H) 0.8 - 1.2    Comment: (NOTE) INR goal varies based on device and disease states. Performed at [facility], [address], [place], [place] [address]   Glucose, capillary     Status: Abnormal   Collection Time: 01/28/23  9:47 AM  Result Value Ref Range   Glucose-Capillary 110 (H) 70 - 99 mg/dL    Comment: Glucose reference range applies only to samples taken after fasting for at least 8 hours.  Blood gas, arterial     Status: Abnormal   Collection Time: 01/28/23  9:59 AM  Result Value Ref Range   FIO2 85 %   Mode PRESSURE REGULATED VOLUME CONTROL    MECHVT 630 mL   RATE 24 resp/min   PEEP 5 cm H20   pH, Arterial 7.32 (L) 7.35 - 7.45   pCO2 arterial 38 32 - 48 mmHg   pO2, Arterial 227 (H) 83 - 108 mmHg   Bicarbonate 19.6 (L) 20.0 - 28.0 mmol/L   Acid-base deficit 5.9 (H) 0.0 - 2.0 mmol/L   O2 Saturation 100 %   Patient temperature 37.0    Collection site RIGHT RADIAL    Drawn by Morrie Sheldon, RN    Allens test (pass/fail) PASS PASS    Comment: Performed at  [facility], [address] [place], [place] [address]  Prepare RBC (crossmatch)     Status: None   Collection Time: 01/28/23 10:01 AM  Result Value Ref Range   Order Confirmation      BB SAMPLE OR UNITS ALREADY AVAILABLE Performed at [facility], [address] [place], [place] [address]   Type and screen [facility]     Status: None (Preliminary result)   Collection Time: 01/28/23 10:45 AM  Result Value Ref Range   ABO/RH(D) O NEG    Antibody Screen PENDING    Sample Expiration      01/31/2023,2359 Performed at [facility], [address] [place], [place] [address]   Prepare platelet pheresis     Status: None (Preliminary result)  Collection Time: 01/28/23 10:54 AM  Result Value Ref Range   Unit Number [id number]    Blood Component Type PLTP1 PSORALEN TREATED    Unit division 00    Status of Unit ALLOCATED    Transfusion Status      OK TO TRANSFUSE Performed at [facility], [address] [place], [place] [address]   I-STAT 7, (LYTES, BLD GAS, ICA, H+H)     Status: Abnormal   Collection Time: 01/28/23 11:39 AM  Result Value Ref Range   pH, Arterial 7.340 (L) 7.35 - 7.45   pCO2 arterial 41.2 32 - 48 mmHg   pO2, Arterial 155 (H) 83 - 108 mmHg   Bicarbonate 22.2 20.0 - 28.0 mmol/L   TCO2 23 22 - 32 mmol/L   O2 Saturation 99 %   Acid-base deficit 3.0 (H) 0.0 - 2.0 mmol/L   Sodium 141 135 - 145 mmol/L   Potassium 3.3 (L) 3.5 - 5.1 mmol/L   Calcium, Ion 1.22 1.15 - 1.40 mmol/L   HCT 43.0 39.0 - 52.0 %   Hemoglobin 14.6 13.0 - 17.0 g/dL   Patient temperature 69.6 F    Collection site art line    Drawn by RT    Sample type ARTERIAL     DG CHEST PORT 1 VIEW  Result Date: 01/28/2023 CLINICAL DATA:  Postoperative hypoxia, CHF EXAM: PORTABLE CHEST 1 VIEW COMPARISON:  Portable exam 0728 hours compared to 01/27/2023 FINDINGS: Tip of endotracheal tube projects 5.9 cm above carina. Nasogastric tube  coiled in distal esophagus; recommend withdrawal and replacement. RIGHT jugular central venous catheter tip projects over SVC. LEFT jugular line tip projects over confluence of LEFT brachiocephalic vein. RIGHT arm PICC line tip projects over SVC. Normal heart size and mediastinal contours. Atelectasis versus consolidation RIGHT upper and RIGHT lower lobes. Minimal retrocardiac LEFT lower lobe opacity. No pleural effusion or pneumothorax. IMPRESSION: Atelectasis versus infiltrate in RIGHT upper and RIGHT lower lobes, minimally LEFT lower lobe. Nasogastric tube coiled in distal esophagus, recommend withdrawal and replacement. Findings called to [facility] in ICU on 01/28/2023 at 0818 hours. Electronically Signed   By: Ulyses Southward M.D.   On: 01/28/2023 08:17   DG CHEST PORT 1 VIEW  Result Date: 01/27/2023 CLINICAL DATA:  A central venous catheter position. EXAM: PORTABLE CHEST 1 VIEW COMPARISON:  01/27/2023. FINDINGS: The heart size and mediastinal contours are stable. A right PICC line terminates over the superior vena cava. A left internal jugular central venous catheter sheath terminates in the anticipated region of the left brachiocephalic vein. The endotracheal tube terminates 5.6 cm above the carina. An enteric tube courses over the midline and out of the field of view. Lung volumes are low and the lungs appear clear. No pneumothorax. No acute osseous abnormality. IMPRESSION: 1. Left internal jugular central venous catheter sheath terminates in the anticipated region of the left brachiocephalic vein. 2. Remaining support apparatus as described above. Electronically Signed   By: Thornell Sartorius M.D.   On: 01/27/2023 21:56   DG Chest Port 1 View  Result Date: 01/27/2023 CLINICAL DATA:  Central venous catheter in place. EXAM: PORTABLE CHEST 1 VIEW COMPARISON:  Earlier today FINDINGS: New left internal jugular central venous catheter. This appears looped in the midline with tip projecting over the left  mediastinum. The previous right-sided central line remains in place with tip overlying the atrial caval junction. Endotracheal tube tip 5 cm from the carina. Enteric tube below the diaphragm. Previous metallic density overlying  the supraclavicular region has been removed. Normal heart size with stable mediastinal contours. No pneumothorax, pleural effusion or focal airspace disease. IMPRESSION: 1. New left internal jugular central venous catheter appears looped in the midline with tip projecting over the left mediastinum. Recommend repositioning. No pneumothorax. 2. Endotracheal and enteric tubes remain in place. Right-sided central line remains in place. These results will be called to the ordering clinician or representative by the Radiologist Assistant, and communication documented in the PACS or [employer]. Electronically Signed   By: Narda Rutherford M.D.   On: 01/27/2023 20:54   Portable Chest x-ray  Result Date: 01/27/2023 CLINICAL DATA:  Endotracheal tube present. EXAM: PORTABLE CHEST 1 VIEW COMPARISON:  Radiograph 01/22/2023 FINDINGS: Endotracheal tube tip 4.6 cm from the carina. Enteric tube in place with tip below the diaphragm. Right upper extremity PICC/subclavian central line tip overlies the lower SVC. Transverse metallic density just above the level of the clavicles is presumably external to the patient. Suspected small right pleural effusion, improved from prior exam. The heart is normal in size. Stable mediastinal contours. No pneumothorax. Surgical clips as well as skin staples in the upper abdomen. IMPRESSION: 1. Endotracheal tube tip 4.6 cm from the carina. Enteric tube in place with tip below the diaphragm. 2. Suspected small right pleural effusion. Electronically Signed   By: Narda Rutherford M.D.   On: 01/27/2023 19:06    ROS 10 point review of systems is negative except as listed above in HPI.   Physical Exam Blood pressure 98/79, pulse 84, temperature (P) 98.8 F (37.1 C),  temperature source (P) Oral, resp. rate (!) 25, height 6\' 1"  (1.854 m), weight 65.5 kg, SpO2 98 %. Constitutional: well-developed, well-nourished HEENT: pupils equal, round, reactive to light, 2mm b/l, moist conjunctiva, external inspection of ears and nose normal, hearing intact Oropharynx: normal oropharyngeal mucosa, normal dentition Neck: no thyromegaly, trachea midline, unable to assess midline cervical tenderness to palpation Chest: breath sounds equal bilaterally, normal respiratory effort, on 60% and 5, RR24 Abdomen: soft, abthera in place with good seal, duodenostomy tube and T-tubes present GU: no blood at urethral meatus of penis, no scrotal masses or abnormality, foley in place, clear yellow urine Back: no wounds, unable to assess thoracic/lumbar spine tenderness to palpation Rectal: deferred MSK: unable to assess gait/station, no clubbing/cyanosis of fingers/toes, unable to assess ROM of all four extremities Skin: warm, dry, no rashes Psych: unable to assess     Assessment/Plan:  19M with massive GI bleeding secondary to 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 Takeback to OR when coagulopathy corrected. Will obtain consent from wife.  Massive transfusion - continued transfusion requirements, priority to product transported with patient from [facility], send CBC/TEG/T&C Shock - likely hemorrhagic etiology, continue transfusion prn and wean pressors as tolerated. Defer SDS for now given bowel perforation and improving hemodynamics, but low threshold for initiation. Will clarify if TXA has been administered.  AKI - anticipate worsening given hemodynamics, but UOP remains adequate.  Trend creatinine and UOP. VDRF - full support, on 60% and 5, RR24. ABG on arrival 7.34/41/155/-3. Decreased FiO2 to 40%. Augment sedation with analgesic drip.  FEN - TPN, strict NPO while in intestinal discontinuity and pressors DVT - SCDs, hold chemical ppx in  light of active bleeding Foley - to remain  ID - empiric zosyn for bowel ischemia, start 4/23 Dispo - ICU  Clinical update provided to wife at bedside.   Critical care time:  Diamantina Monks, MD General and Trauma Surgery [facility] Surgery

## 2023-01-28 NOTE — Progress Notes (Signed)
[facility] has arrived and patient has been transported to [facility].  Family has been updated.  He is traveling with 2 units of pRBCs and 2FFP.  Upon arrival to [facility] he will need a stat T&S, plts tx, and TEG.  I have discussed this with the team over at [facility] who is going to help take over upon his arrival.  Greatly appreciate the assistance of CCM in this case!  Letha Cape 10:31 AM 01/28/2023

## 2023-01-29 ENCOUNTER — Inpatient Hospital Stay (HOSPITAL_COMMUNITY): Payer: BC Managed Care – PPO | Admitting: General Practice

## 2023-01-29 ENCOUNTER — Encounter (HOSPITAL_COMMUNITY): Admission: AD | Disposition: E | Payer: Self-pay | Source: Ambulatory Visit

## 2023-01-29 ENCOUNTER — Inpatient Hospital Stay (HOSPITAL_COMMUNITY): Payer: BC Managed Care – PPO

## 2023-01-29 ENCOUNTER — Other Ambulatory Visit: Payer: Self-pay

## 2023-01-29 ENCOUNTER — Encounter (HOSPITAL_COMMUNITY): Payer: Self-pay | Admitting: Surgery

## 2023-01-29 HISTORY — PX: APPLICATION OF WOUND VAC: SHX5189

## 2023-01-29 HISTORY — PX: CECOSTOMY: SHX1316

## 2023-01-29 HISTORY — PX: LAPAROSCOPIC ABDOMINAL EXPLORATION: SHX6249

## 2023-01-29 HISTORY — PX: LAPAROTOMY: SHX154

## 2023-01-29 LAB — PREPARE FRESH FROZEN PLASMA
Unit division: 0
Unit division: 0
Unit division: 0
Unit division: 0
Unit division: 0
Unit division: 0
Unit division: 0
Unit division: 0
Unit division: 0
Unit division: 0
Unit division: 0
Unit division: 0
Unit division: 0
Unit division: 0
Unit division: 0
Unit division: 0

## 2023-01-29 LAB — GLUCOSE, CAPILLARY
Glucose-Capillary: 110 mg/dL — ABNORMAL HIGH (ref 70–99)
Glucose-Capillary: 108 mg/dL — ABNORMAL HIGH (ref 70–99)
Glucose-Capillary: 75 mg/dL (ref 70–99)
Glucose-Capillary: 59 mg/dL — ABNORMAL LOW (ref 70–99)
Glucose-Capillary: 79 mg/dL (ref 70–99)
Glucose-Capillary: 98 mg/dL (ref 70–99)
Glucose-Capillary: 88 mg/dL (ref 70–99)

## 2023-01-29 LAB — BPAM RBC
Blood Product Expiration Date: 202405202359
Blood Product Expiration Date: 202405242359
Blood Product Expiration Date: 202405242359
Blood Product Expiration Date: 202405242359
Blood Product Expiration Date: 202405242359
Blood Product Expiration Date: 202405252359
Blood Product Expiration Date: 202405252359
Blood Product Expiration Date: 202405252359
Blood Product Expiration Date: 202405252359
Blood Product Expiration Date: 202405252359
Blood Product Expiration Date: 202405252359
Blood Product Expiration Date: 202405262359
Blood Product Expiration Date: 202405262359
Blood Product Expiration Date: 202405262359
Blood Product Expiration Date: 202405272359
Blood Product Expiration Date: 202405302359
Blood Product Expiration Date: 202405302359
Blood Product Expiration Date: 202405302359
Blood Product Expiration Date: 202406012359
Blood Product Expiration Date: 202405232359
Blood Product Expiration Date: 202405252359
Blood Product Expiration Date: 202405262359
Blood Product Expiration Date: 202405262359
Blood Product Expiration Date: 202405262359
Blood Product Expiration Date: 202405272359
ISSUE DATE / TIME: 202404221401
ISSUE DATE / TIME: 202404221440
ISSUE DATE / TIME: 202404221805
ISSUE DATE / TIME: 202404222003
ISSUE DATE / TIME: 202404230244
ISSUE DATE / TIME: 202404230335
ISSUE DATE / TIME: 202404230335
ISSUE DATE / TIME: 202404230335
ISSUE DATE / TIME: 202404230335
ISSUE DATE / TIME: 202404230403
ISSUE DATE / TIME: 202404230403
ISSUE DATE / TIME: 202404230403
ISSUE DATE / TIME: 202404230431
ISSUE DATE / TIME: 202404230431
ISSUE DATE / TIME: 202404230431
ISSUE DATE / TIME: 202404230454
ISSUE DATE / TIME: 202404230454
ISSUE DATE / TIME: 202404230454
ISSUE DATE / TIME: 202404240724
ISSUE DATE / TIME: 202404240643
ISSUE DATE / TIME: 202404240710
ISSUE DATE / TIME: 202404240710
ISSUE DATE / TIME: 202404240724
Unit Type and Rh: 5100
Unit Type and Rh: 5100
Unit Type and Rh: 5100
Unit Type and Rh: 5100
Unit Type and Rh: 5100
Unit Type and Rh: 5100
Unit Type and Rh: 5100
Unit Type and Rh: 5100
Unit Type and Rh: 5100
Unit Type and Rh: 5100
Unit Type and Rh: 5100
Unit Type and Rh: 5100
Unit Type and Rh: 5100
Unit Type and Rh: 5100
Unit Type and Rh: 5100
Unit Type and Rh: 5100
Unit Type and Rh: 5100
Unit Type and Rh: 5100
Unit Type and Rh: 9500
Unit Type and Rh: 9500
Unit Type and Rh: 5100
Unit Type and Rh: 5100
Unit Type and Rh: 5100
Unit Type and Rh: 5100
Unit Type and Rh: 5100

## 2023-01-29 LAB — BPAM FFP
Blood Product Expiration Date: 202405052359
Blood Product Expiration Date: 202405052359
Blood Product Expiration Date: 202404282359
Blood Product Expiration Date: 202404282359
Blood Product Expiration Date: 202404282359
Blood Product Expiration Date: 202404282359
Blood Product Expiration Date: 202404282359
Blood Product Expiration Date: 202404282359
Blood Product Expiration Date: 202404282359
Blood Product Expiration Date: 202404282359
Blood Product Expiration Date: 202404282359
Blood Product Expiration Date: 202404282359
Blood Product Expiration Date: 202404282359
Blood Product Expiration Date: 202404272359
Blood Product Expiration Date: 202404272359
Blood Product Expiration Date: 202404272359
Blood Product Expiration Date: 202404272359
Blood Product Expiration Date: 202404272359
Blood Product Expiration Date: 202404282359
Blood Product Expiration Date: 202404282359
Blood Product Expiration Date: 202404292359
Blood Product Expiration Date: 202404292359
ISSUE DATE / TIME: 202404222326
ISSUE DATE / TIME: 202404230150
ISSUE DATE / TIME: 202404230248
ISSUE DATE / TIME: 202404230332
ISSUE DATE / TIME: 202404230407
ISSUE DATE / TIME: 202404230435
ISSUE DATE / TIME: 202404230435
ISSUE DATE / TIME: 202404230503
ISSUE DATE / TIME: 202404230534
ISSUE DATE / TIME: 202404240643
ISSUE DATE / TIME: 202404240711
ISSUE DATE / TIME: 202404240721
ISSUE DATE / TIME: 202404240748
ISSUE DATE / TIME: 202404240748
ISSUE DATE / TIME: 202404240748
ISSUE DATE / TIME: 202404240748
ISSUE DATE / TIME: 202404240748
ISSUE DATE / TIME: 202404240643
ISSUE DATE / TIME: 202404240643
Unit Type and Rh: 5100
Unit Type and Rh: 5100
Unit Type and Rh: 5100
Unit Type and Rh: 5100
Unit Type and Rh: 5100
Unit Type and Rh: 5100
Unit Type and Rh: 5100
Unit Type and Rh: 6200
Unit Type and Rh: 6200
Unit Type and Rh: 9500
Unit Type and Rh: 5100
Unit Type and Rh: 5100
Unit Type and Rh: 6200
Unit Type and Rh: 6200
Unit Type and Rh: 9500
Unit Type and Rh: 5100
Unit Type and Rh: 5100
Unit Type and Rh: 5100

## 2023-01-29 LAB — POCT I-STAT 7, (LYTES, BLD GAS, ICA,H+H)
Acid-base deficit: 2 mmol/L (ref 0.0–2.0)
Acid-base deficit: 7 mmol/L — ABNORMAL HIGH (ref 0.0–2.0)
Acid-base deficit: 2 mmol/L (ref 0.0–2.0)
Bicarbonate: 24 mmol/L (ref 20.0–28.0)
Bicarbonate: 21.8 mmol/L (ref 20.0–28.0)
Bicarbonate: 24.3 mmol/L (ref 20.0–28.0)
Calcium, Ion: 1.19 mmol/L (ref 1.15–1.40)
Calcium, Ion: 0.75 mmol/L — CL (ref 1.15–1.40)
Calcium, Ion: 1.07 mmol/L — ABNORMAL LOW (ref 1.15–1.40)
HCT: 47 % (ref 39.0–52.0)
HCT: 29 % — ABNORMAL LOW (ref 39.0–52.0)
HCT: 34 % — ABNORMAL LOW (ref 39.0–52.0)
Hemoglobin: 16 g/dL (ref 13.0–17.0)
Hemoglobin: 9.9 g/dL — ABNORMAL LOW (ref 13.0–17.0)
Hemoglobin: 11.6 g/dL — ABNORMAL LOW (ref 13.0–17.0)
O2 Saturation: 98 %
O2 Saturation: 100 %
O2 Saturation: 99 %
Patient temperature: 38.1
Patient temperature: 36.9
Patient temperature: 35.3
Potassium: 4.2 mmol/L (ref 3.5–5.1)
Potassium: 3.7 mmol/L (ref 3.5–5.1)
Potassium: 3.9 mmol/L (ref 3.5–5.1)
Sodium: 141 mmol/L (ref 135–145)
Sodium: 144 mmol/L (ref 135–145)
Sodium: 142 mmol/L (ref 135–145)
TCO2: 25 mmol/L (ref 22–32)
TCO2: 24 mmol/L (ref 22–32)
TCO2: 26 mmol/L (ref 22–32)
pCO2 arterial: 45.6 mmHg (ref 32–48)
pCO2 arterial: 59.8 mmHg — ABNORMAL HIGH (ref 32–48)
pCO2 arterial: 41.8 mmHg (ref 32–48)
pH, Arterial: 7.334 — ABNORMAL LOW (ref 7.35–7.45)
pH, Arterial: 7.168 — CL (ref 7.35–7.45)
pH, Arterial: 7.364 (ref 7.35–7.45)
pO2, Arterial: 115 mmHg — ABNORMAL HIGH (ref 83–108)
pO2, Arterial: 407 mmHg — ABNORMAL HIGH (ref 83–108)
pO2, Arterial: 132 mmHg — ABNORMAL HIGH (ref 83–108)

## 2023-01-29 LAB — TYPE AND SCREEN
Antibody Screen: NEGATIVE
Unit division: 0
Unit division: 0
Unit division: 0
Unit division: 0
Unit division: 0
Unit division: 0
Unit division: 0
Unit division: 0
Unit division: 0
Unit division: 0
Unit division: 0
Unit division: 0
Unit division: 0
Unit division: 0
Unit division: 0
Unit division: 0
Unit division: 0
Unit division: 0
Unit division: 0
Unit division: 0
Unit division: 0
Unit division: 0
Unit division: 0
Unit division: 0
Unit division: 0

## 2023-01-29 LAB — CBC
HCT: 49.4 % (ref 39.0–52.0)
HCT: 36.7 % — ABNORMAL LOW (ref 39.0–52.0)
HCT: 38.5 % — ABNORMAL LOW (ref 39.0–52.0)
Hemoglobin: 16.7 g/dL (ref 13.0–17.0)
Hemoglobin: 12.4 g/dL — ABNORMAL LOW (ref 13.0–17.0)
Hemoglobin: 13.6 g/dL (ref 13.0–17.0)
MCH: 28.6 pg (ref 26.0–34.0)
MCH: 29.1 pg (ref 26.0–34.0)
MCH: 29.1 pg (ref 26.0–34.0)
MCHC: 33.8 g/dL (ref 30.0–36.0)
MCHC: 33.8 g/dL (ref 30.0–36.0)
MCHC: 35.3 g/dL (ref 30.0–36.0)
MCV: 84.7 fL (ref 80.0–100.0)
MCV: 86.2 fL (ref 80.0–100.0)
MCV: 82.4 fL (ref 80.0–100.0)
Platelets: 68 10*3/uL — ABNORMAL LOW (ref 150–400)
Platelets: 42 10*3/uL — ABNORMAL LOW (ref 150–400)
Platelets: 38 10*3/uL — ABNORMAL LOW (ref 150–400)
RBC: 5.83 MIL/uL — ABNORMAL HIGH (ref 4.22–5.81)
RBC: 4.26 MIL/uL (ref 4.22–5.81)
RBC: 4.67 MIL/uL (ref 4.22–5.81)
RDW: 17.4 % — ABNORMAL HIGH (ref 11.5–15.5)
RDW: 15.9 % — ABNORMAL HIGH (ref 11.5–15.5)
RDW: 16.4 % — ABNORMAL HIGH (ref 11.5–15.5)
WBC: 7.7 10*3/uL (ref 4.0–10.5)
WBC: 4.4 10*3/uL (ref 4.0–10.5)
WBC: 6.2 10*3/uL (ref 4.0–10.5)
nRBC: 0.3 % — ABNORMAL HIGH (ref 0.0–0.2)
nRBC: 0.5 % — ABNORMAL HIGH (ref 0.0–0.2)
nRBC: 0 % (ref 0.0–0.2)

## 2023-01-29 LAB — PREPARE CRYOPRECIPITATE
Unit division: 0
Unit division: 0
Unit division: 0
Unit division: 0

## 2023-01-29 LAB — BPAM PLATELET PHERESIS
Blood Product Expiration Date: 202404262359
Blood Product Expiration Date: 202404262359
Blood Product Expiration Date: 202404262359
Blood Product Expiration Date: 202404242359
Blood Product Expiration Date: 202404252359
Blood Product Expiration Date: 202404252359
Blood Product Expiration Date: 202404262359
ISSUE DATE / TIME: 202404230412
ISSUE DATE / TIME: 202404230604
ISSUE DATE / TIME: 202404231218
ISSUE DATE / TIME: 202404231344
ISSUE DATE / TIME: 202404231454
ISSUE DATE / TIME: 202404240646
ISSUE DATE / TIME: 202404241738
Unit Type and Rh: 6200
Unit Type and Rh: 6200
Unit Type and Rh: 5100
Unit Type and Rh: 6200
Unit Type and Rh: 6200

## 2023-01-29 LAB — BPAM CRYOPRECIPITATE
Blood Product Expiration Date: 202404231019
Blood Product Expiration Date: 202404231139
ISSUE DATE / TIME: 202404230126
ISSUE DATE / TIME: 202404230421
ISSUE DATE / TIME: 202404231844
Unit Type and Rh: 5100
Unit Type and Rh: 5100
Unit Type and Rh: 6200
Unit Type and Rh: 6200

## 2023-01-29 LAB — PREPARE PLATELET PHERESIS
Unit division: 0
Unit division: 0
Unit division: 0
Unit division: 0
Unit division: 0
Unit division: 0

## 2023-01-29 LAB — COMPREHENSIVE METABOLIC PANEL
ALT: 645 U/L — ABNORMAL HIGH (ref 0–44)
AST: 755 U/L — ABNORMAL HIGH (ref 15–41)
Albumin: 2.1 g/dL — ABNORMAL LOW (ref 3.5–5.0)
Alkaline Phosphatase: 41 U/L (ref 38–126)
Anion gap: 12 (ref 5–15)
BUN: 37 mg/dL — ABNORMAL HIGH (ref 8–23)
CO2: 23 mmol/L (ref 22–32)
Calcium: 7.7 mg/dL — ABNORMAL LOW (ref 8.9–10.3)
Chloride: 102 mmol/L (ref 98–111)
Creatinine, Ser: 1.4 mg/dL — ABNORMAL HIGH (ref 0.61–1.24)
GFR, Estimated: 55 mL/min — ABNORMAL LOW (ref >60)
Glucose, Bld: 118 mg/dL — ABNORMAL HIGH (ref 70–99)
Potassium: 4.2 mmol/L (ref 3.5–5.1)
Sodium: 137 mmol/L (ref 135–145)
Total Bilirubin: 1.4 mg/dL — ABNORMAL HIGH (ref 0.3–1.2)
Total Protein: 4 g/dL — ABNORMAL LOW (ref 6.5–8.1)

## 2023-01-29 LAB — URINALYSIS, ROUTINE W REFLEX MICROSCOPIC
Bilirubin Urine: NEGATIVE
Glucose, UA: NEGATIVE mg/dL
Ketones, ur: NEGATIVE mg/dL
Leukocytes,Ua: NEGATIVE
Nitrite: NEGATIVE
Protein, ur: 30 mg/dL — AB
RBC / HPF: >50 RBC/hpf (ref 0–5)
Specific Gravity, Urine: 1.019 (ref 1.005–1.030)
pH: 5 (ref 5.0–8.0)

## 2023-01-29 LAB — HEMOGLOBIN AND HEMATOCRIT, BLOOD
HCT: 39 % (ref 39.0–52.0)
Hemoglobin: 13.8 g/dL (ref 13.0–17.0)

## 2023-01-29 LAB — DIC (DISSEMINATED INTRAVASCULAR COAGULATION)PANEL
D-Dimer, Quant: 11.03 ug/mL-FEU — ABNORMAL HIGH (ref 0.00–0.50)
D-Dimer, Quant: 7.98 ug/mL-FEU — ABNORMAL HIGH (ref 0.00–0.50)
Fibrinogen: 261 mg/dL (ref 210–475)
Fibrinogen: 201 mg/dL — ABNORMAL LOW (ref 210–475)
INR: 2.7 — ABNORMAL HIGH (ref 0.8–1.2)
INR: 1.5 — ABNORMAL HIGH (ref 0.8–1.2)
Platelets: 83 10*3/uL — ABNORMAL LOW (ref 150–400)
Platelets: 42 10*3/uL — ABNORMAL LOW (ref 150–400)
Prothrombin Time: 28.3 seconds — ABNORMAL HIGH (ref 11.4–15.2)
Prothrombin Time: 17.8 seconds — ABNORMAL HIGH (ref 11.4–15.2)
Smear Review: NONE SEEN
Smear Review: NONE SEEN
aPTT: 41 seconds — ABNORMAL HIGH (ref 24–36)
aPTT: 37 seconds — ABNORMAL HIGH (ref 24–36)

## 2023-01-29 LAB — BASIC METABOLIC PANEL
Anion gap: 16 — ABNORMAL HIGH (ref 5–15)
Anion gap: 11 (ref 5–15)
BUN: 32 mg/dL — ABNORMAL HIGH (ref 8–23)
BUN: 36 mg/dL — ABNORMAL HIGH (ref 8–23)
CO2: 23 mmol/L (ref 22–32)
CO2: 26 mmol/L (ref 22–32)
Calcium: 7.8 mg/dL — ABNORMAL LOW (ref 8.9–10.3)
Calcium: 7.9 mg/dL — ABNORMAL LOW (ref 8.9–10.3)
Chloride: 100 mmol/L (ref 98–111)
Chloride: 105 mmol/L (ref 98–111)
Creatinine, Ser: 1.31 mg/dL — ABNORMAL HIGH (ref 0.61–1.24)
Creatinine, Ser: 1.52 mg/dL — ABNORMAL HIGH (ref 0.61–1.24)
GFR, Estimated: >60 mL/min (ref >60)
GFR, Estimated: 50 mL/min — ABNORMAL LOW (ref >60)
Glucose, Bld: 123 mg/dL — ABNORMAL HIGH (ref 70–99)
Glucose, Bld: 62 mg/dL — ABNORMAL LOW (ref 70–99)
Potassium: 3.9 mmol/L (ref 3.5–5.1)
Potassium: 4 mmol/L (ref 3.5–5.1)
Sodium: 139 mmol/L (ref 135–145)
Sodium: 142 mmol/L (ref 135–145)

## 2023-01-29 LAB — SURGICAL PATHOLOGY

## 2023-01-29 LAB — MASSIVE TRANSFUSION PROTOCOL ORDER (BLOOD BANK NOTIFICATION)

## 2023-01-29 LAB — PHOSPHORUS
Phosphorus: 4.3 mg/dL (ref 2.5–4.6)
Phosphorus: 7.4 mg/dL — ABNORMAL HIGH (ref 2.5–4.6)
Phosphorus: 5.9 mg/dL — ABNORMAL HIGH (ref 2.5–4.6)

## 2023-01-29 LAB — TRAUMA TEG PANEL
CFF Max Amplitude: 14.8 mm — ABNORMAL LOW (ref 15–32)
Citrated Kaolin (R): 7.2 min (ref 4.6–9.1)
Citrated Rapid TEG (MA): 43.9 mm — ABNORMAL LOW (ref 52–70)
Lysis at 30 Minutes: 0 % (ref 0.0–2.6)

## 2023-01-29 LAB — MAGNESIUM
Magnesium: 2.2 mg/dL (ref 1.7–2.4)
Magnesium: 1.8 mg/dL (ref 1.7–2.4)
Magnesium: 2.4 mg/dL (ref 1.7–2.4)

## 2023-01-29 SURGERY — LAPAROTOMY, EXPLORATORY
Anesthesia: General | Site: Abdomen

## 2023-01-29 MED ORDER — MIDAZOLAM HCL 2 MG/2ML IJ SOLN
INTRAMUSCULAR | Status: AC
  Filled 2023-01-29: qty 2

## 2023-01-29 MED ORDER — ALBUMIN HUMAN 5 % IV SOLN: 12.5000 g | Freq: Once | INTRAVENOUS | Status: DC

## 2023-01-29 MED ORDER — HYDROCORTISONE SOD SUC (PF) 100 MG IJ SOLR
INTRAMUSCULAR | Status: AC
  Filled 2023-01-29: qty 2

## 2023-01-29 MED ORDER — DEXTROSE 10 % IV SOLN: INTRAVENOUS | Status: AC

## 2023-01-29 MED ORDER — LACTATED RINGERS IV SOLN: INTRAVENOUS | Status: DC | PRN

## 2023-01-29 MED ORDER — CALCIUM CHLORIDE 10 % IV SOLN
INTRAVENOUS | Status: AC
  Filled 2023-01-29: qty 10

## 2023-01-29 MED ORDER — TRANEXAMIC ACID-NACL 1000-0.7 MG/100ML-% IV SOLN
INTRAVENOUS | Status: AC
  Filled 2023-01-29: qty 100

## 2023-01-29 MED ORDER — TRAVASOL 10 % IV SOLN
INTRAVENOUS | Status: AC
  Filled 2023-01-29: qty 1122

## 2023-01-29 MED ORDER — MIDAZOLAM HCL 2 MG/2ML IJ SOLN
INTRAMUSCULAR | Status: DC | PRN
  Administered 2023-01-29: 2 mg via INTRAVENOUS

## 2023-01-29 MED ORDER — DEXTROSE 50 % IV SOLN
12.5000 g | INTRAVENOUS | Status: AC
  Administered 2023-01-29: 12.5 g via INTRAVENOUS

## 2023-01-29 MED ORDER — ALBUMIN HUMAN 5 % IV SOLN
INTRAVENOUS | Status: AC
  Filled 2023-01-29: qty 250

## 2023-01-29 MED ORDER — ROCURONIUM BROMIDE 10 MG/ML (PF) SYRINGE
PREFILLED_SYRINGE | INTRAVENOUS | Status: AC
  Filled 2023-01-29: qty 10

## 2023-01-29 MED ORDER — NOREPINEPHRINE 16 MG/250ML-% IV SOLN
0.0000 ug/min | INTRAVENOUS | Status: DC
  Administered 2023-01-29: 100 ug/min via INTRAVENOUS
  Administered 2023-01-30: 10 ug/min via INTRAVENOUS
  Filled 2023-01-29: qty 250

## 2023-01-29 MED ORDER — ALBUMIN HUMAN 5 % IV SOLN: INTRAVENOUS | Status: DC | PRN

## 2023-01-29 MED ORDER — MAGNESIUM SULFATE 2 GM/50ML IV SOLN
2.0000 g | Freq: Once | INTRAVENOUS | Status: AC
  Administered 2023-01-29: 2 g via INTRAVENOUS
  Filled 2023-01-29: qty 50

## 2023-01-29 MED ORDER — CALCIUM CHLORIDE 10 % IV SOLN
INTRAVENOUS | Status: DC | PRN
  Administered 2023-01-29 (×5): 200 mg via INTRAVENOUS

## 2023-01-29 MED ORDER — SODIUM BICARBONATE 8.4 % IV SOLN
INTRAVENOUS | Status: DC | PRN
  Administered 2023-01-29: 50 meq via INTRAVENOUS

## 2023-01-29 MED ORDER — DEXTROSE 50 % IV SOLN
INTRAVENOUS | Status: AC
  Filled 2023-01-29: qty 50

## 2023-01-29 MED ORDER — ROCURONIUM BROMIDE 10 MG/ML (PF) SYRINGE
PREFILLED_SYRINGE | INTRAVENOUS | Status: DC | PRN
  Administered 2023-01-29: 60 mg via INTRAVENOUS
  Administered 2023-01-29: 40 mg via INTRAVENOUS

## 2023-01-29 MED ORDER — TRANEXAMIC ACID 1000 MG/10ML IV SOLN
1000.0000 mg | Freq: Once | INTRAVENOUS | Status: DC
  Filled 2023-01-29: qty 10

## 2023-01-29 MED ORDER — FENTANYL CITRATE (PF) 250 MCG/5ML IJ SOLN
INTRAMUSCULAR | Status: DC | PRN
  Administered 2023-01-29: 50 ug via INTRAVENOUS

## 2023-01-29 MED ORDER — FENTANYL CITRATE (PF) 250 MCG/5ML IJ SOLN
INTRAMUSCULAR | Status: AC
  Filled 2023-01-29: qty 5

## 2023-01-29 MED ORDER — TRANEXAMIC ACID-NACL 1000-0.7 MG/100ML-% IV SOLN: 1000.0000 mg | Freq: Once | INTRAVENOUS | Status: AC

## 2023-01-29 MED ORDER — DEXMEDETOMIDINE HCL IN NACL 400 MCG/100ML IV SOLN
0.0000 ug/kg/h | INTRAVENOUS | Status: DC
  Administered 2023-01-29: .7 ug/kg/h via INTRAVENOUS
  Administered 2023-01-29: 0.5 ug/kg/h via INTRAVENOUS
  Administered 2023-01-30: 0.4 ug/kg/h via INTRAVENOUS
  Administered 2023-01-30: 0.3 ug/kg/h via INTRAVENOUS
  Administered 2023-02-03: 0.4 ug/kg/h via INTRAVENOUS
  Administered 2023-02-04: 0.2 ug/kg/h via INTRAVENOUS
  Administered 2023-02-05: 0.3 ug/kg/h via INTRAVENOUS
  Filled 2023-01-29 (×7): qty 100

## 2023-01-29 MED ORDER — HEMOSTATIC AGENTS (NO CHARGE) OPTIME
TOPICAL | Status: DC | PRN
  Administered 2023-01-28: 1 via TOPICAL

## 2023-01-29 MED ORDER — CALCIUM GLUCONATE-NACL 1-0.675 GM/50ML-% IV SOLN
1.0000 g | Freq: Once | INTRAVENOUS | Status: AC
  Administered 2023-01-29: 1000 mg via INTRAVENOUS
  Filled 2023-01-29: qty 50

## 2023-01-29 SURGICAL SUPPLY — 44 items
APL PRP STRL LF DISP 70% ISPRP (MISCELLANEOUS) ×3
BAG COUNTER SPONGE SURGICOUNT (BAG) ×3 IMPLANT
BAG DRN RND TRDRP ANRFLXCHMBR (UROLOGICAL SUPPLIES) ×3
BAG SPNG CNTER NS LX DISP (BAG) ×3
BAG URINE DRAIN 2000ML AR STRL (UROLOGICAL SUPPLIES) IMPLANT
BLADE CLIPPER SURG (BLADE) IMPLANT
CANISTER SUCT 3000ML PPV (MISCELLANEOUS) ×3 IMPLANT
CANISTER WOUND CARE 500ML ATS (WOUND CARE) IMPLANT
CHLORAPREP W/TINT 26 (MISCELLANEOUS) ×3 IMPLANT
COVER SURGICAL LIGHT HANDLE (MISCELLANEOUS) ×3 IMPLANT
DRAPE LAPAROSCOPIC ABDOMINAL (DRAPES) ×3 IMPLANT
DRAPE WARM FLUID 44X44 (DRAPES) ×3 IMPLANT
ELECT BLADE 6.5 EXT (BLADE) IMPLANT
ELECT REM PT RETURN 9FT ADLT (ELECTROSURGICAL) ×3
ELECTRODE REM PT RTRN 9FT ADLT (ELECTROSURGICAL) ×3 IMPLANT
GLOVE BIO SURGEON STRL SZ7.5 (GLOVE) ×3 IMPLANT
GLOVE INDICATOR 8.0 STRL GRN (GLOVE) ×3 IMPLANT
GOWN STRL REUS W/ TWL LRG LVL3 (GOWN DISPOSABLE) ×3 IMPLANT
GOWN STRL REUS W/ TWL XL LVL3 (GOWN DISPOSABLE) ×3 IMPLANT
GOWN STRL REUS W/TWL LRG LVL3 (GOWN DISPOSABLE) ×3
GOWN STRL REUS W/TWL XL LVL3 (GOWN DISPOSABLE) ×3
HANDLE SUCTION POOLE (INSTRUMENTS) ×3 IMPLANT
KIT BASIN OR (CUSTOM PROCEDURE TRAY) ×3 IMPLANT
KIT TURNOVER KIT B (KITS) ×3 IMPLANT
LIGASURE IMPACT 36 18CM CVD LR (INSTRUMENTS) IMPLANT
NS IRRIG 1000ML POUR BTL (IV SOLUTION) ×6 IMPLANT
PACK GENERAL/GYN (CUSTOM PROCEDURE TRAY) ×3 IMPLANT
PAD ARMBOARD 7.5X6 YLW CONV (MISCELLANEOUS) ×3 IMPLANT
PENCIL SMOKE EVACUATOR (MISCELLANEOUS) ×3 IMPLANT
SPECIMEN JAR LARGE (MISCELLANEOUS) IMPLANT
SPONGE ABD ABTHERA ADVANCE (MISCELLANEOUS) IMPLANT
SPONGE T-LAP 18X18 ~~LOC~~+RFID (SPONGE) IMPLANT
STAPLER PROXIMATE 75MM BLUE (STAPLE) IMPLANT
STAPLER VISISTAT 35W (STAPLE) ×3 IMPLANT
SUCTION POOLE HANDLE (INSTRUMENTS) ×3
SUT PDS AB 1 TP1 96 (SUTURE) ×6 IMPLANT
SUT SILK 2 0 SH CR/8 (SUTURE) ×3 IMPLANT
SUT SILK 2 0 TIES 10X30 (SUTURE) ×3 IMPLANT
SUT SILK 3 0 SH CR/8 (SUTURE) ×3 IMPLANT
SUT SILK 3 0 TIES 10X30 (SUTURE) ×3 IMPLANT
SUT VIC AB 3-0 SH 18 (SUTURE) IMPLANT
TOWEL GREEN STERILE (TOWEL DISPOSABLE) ×3 IMPLANT
TRAY FOLEY MTR SLVR 16FR STAT (SET/KITS/TRAYS/PACK) ×3 IMPLANT
YANKAUER SUCT BULB TIP NO VENT (SUCTIONS) IMPLANT

## 2023-01-29 NOTE — Anesthesia Procedure Notes (Signed)
Date/Time: 01/29/2023 7:07 AM  Performed by: Maxine Glenn, CRNAPre-anesthesia Checklist: Patient identified, Emergency Drugs available, Suction available, Patient being monitored and Timeout performed Patient Re-evaluated:Patient Re-evaluated prior to induction Oxygen Delivery Method: Circle system utilized Preoxygenation: Pre-oxygenation with 100% oxygen Induction Type: Inhalational induction with existing ETT

## 2023-01-29 NOTE — Progress Notes (Addendum)
2300: Notified Dr. Bedelia Person pt platelet level 63 after receiving 1 unit of platelets and cryo. Stable drain and urine output on levo and vaso.   0600: Pt systolic bp began dropping into 80s per aline. Pt vaso and levo requirement increasing so started epi gtt and notifed Dr Bedelia Person. New order for 5% albumin bolus and 100 mg hydrocortisone per Dr Bedelia Person. Teg sent to lab. 0630: Dr Bedelia Person and Dr. Freida Busman at bedside noted new bloody drainage from wound vac, JP drain, gastrostomy, and T tube. All Pressors near their ceiling. Order to start MTP. Pt taken to OR emergently with anesthesia.

## 2023-01-29 NOTE — Anesthesia Preprocedure Evaluation (Addendum)
Anesthesia Evaluation  Patient identified by MRN, date of birth, ID band Patient unresponsive    Reviewed: Allergy & Precautions, H&P , NPO status , Patient's Chart, lab work & pertinent test results, Unable to perform ROS - Chart review onlyPreop documentation limited or incomplete due to emergent nature of procedure.  Airway Mallampati: Intubated       Dental   Pulmonary neg pulmonary ROS   breath sounds clear to auscultation       Cardiovascular negative cardio ROS  Rhythm:Regular Rate:Normal     Neuro/Psych negative neurological ROS  negative psych ROS   GI/Hepatic negative GI ROS, Neg liver ROS,,,  Endo/Other  negative endocrine ROS    Renal/GU negative Renal ROS  negative genitourinary   Musculoskeletal  (+) Arthritis , Osteoarthritis,    Abdominal   Peds negative pediatric ROS (+)  Hematology negative hematology ROS (+) Lab Results      Component                Value               Date                      WBC                      5.6                 01/28/2023                HGB                      6.0 (LL)            01/28/2023                HCT                      18.1 (L)            01/28/2023                MCV                      90.0                01/28/2023                PLT                      101 (L)             01/28/2023                PLT                      100 (L)             01/28/2023            Lab Results      Component                Value               Date                      INR  1.7 (H)             01/28/2023                INR                      1.6 (H)             01/27/2023                INR                      1.1                 01/23/2023              Anesthesia Other Findings Small bowel obstruction Open abdomen  Reproductive/Obstetrics negative OB ROS                             Anesthesia Physical Anesthesia  Plan  ASA: 5 and emergent  Anesthesia Plan: General   Post-op Pain Management:    Induction: Intravenous  PONV Risk Score and Plan: 2 and Treatment may vary due to age or medical condition  Airway Management Planned: Oral ETT  Additional Equipment: Arterial line  Intra-op Plan:   Post-operative Plan: Post-operative intubation/ventilation  Informed Consent:      Only emergency history available and History available from chart only  Plan Discussed with: Surgeon and CRNA  Anesthesia Plan Comments:        Anesthesia Quick Evaluation

## 2023-01-29 NOTE — Progress Notes (Signed)
Trauma Event Note    Stopped MTP at this time per Dr. Bedelia Person.  Called blood bank, told to keep 4+4 ahead.  Last imported Vital Signs BP 134/77   Pulse 81   Temp 100.2 F (37.9 C)   Resp (!) 24   Ht  (1.854 m)   Wt 90.8 kg   SpO2 97%   BMI 26.41 kg/m   Trending CBC Recent Labs    01/28/23 1707 01/28/23 2150 01/29/23 0340 01/29/23 0348 01/29/23 0702 01/29/23 0731  WBC 4.3 5.6  --  7.7  --   --   HGB 15.7 16.5   < > 16.7 13.8 9.9*  HCT 45.2 45.4   < > 49.4 39.0 29.0*  PLT 45* 63*  --  68* 83*  --    < > = values in this interval not displayed.    Trending Coag's Recent Labs    01/28/23 0020 01/28/23 0550 01/28/23 0923 01/29/23 0702  APTT 40* 49*  --  41*  INR 1.7* 1.7* 1.4* 2.7*    Trending BMET Recent Labs    01/28/23 1305 01/28/23 1707 01/29/23 0340 01/29/23 0348 01/29/23 0731  NA 140 140 141 137 144  K 2.9* 2.8* 4.2 4.2 3.7  CL 108 107  --  102  --   CO2 21* 23  --  23  --   BUN 32* 34*  --  37*  --   CREATININE 1.13 1.24  --  1.40*  --   GLUCOSE 111* 94  --  118*  --     Bradley Dominguez  Trauma Response RN  Please call TRN at [phone] for further assistance.

## 2023-01-29 NOTE — Anesthesia Postprocedure Evaluation (Signed)
Anesthesia Post Note  Patient: Bradley Dominguez  Procedure(s) Performed: OPEN ABDOMINAL WASHOUT APPLICATION OF WOUND VAC ReExploration Laparotomy, Removal of Abdominal Packing, Repacked Abdomen Packing (Abdomen) ILLOCECOSTOMY (Abdomen)     Patient location during evaluation: ICU Anesthesia Type: General Level of consciousness: patient remains intubated per anesthesia plan Pain management: satisfactory to patient Vital Signs Assessment: post-procedure vital signs reviewed and stable Respiratory status: respiratory function stable and patient remains intubated per anesthesia plan Cardiovascular status: blood pressure returned to baseline and tachycardic Postop Assessment: no apparent nausea or vomiting Anesthetic complications: no   No notable events documented.  Last Vitals:  Vitals:   01/29/23 0813 01/29/23 0900  BP: 134/77 108/77  Pulse: 81 67  Resp: (!) 24 (!) 24  Temp:  (!) 35.3 C  SpO2: 97% 99%    Last Pain:  Vitals:   01/28/23 2015  TempSrc: Bladder  PainSc:                  Lowella Curb

## 2023-01-29 NOTE — Progress Notes (Signed)
Trauma/Critical Care Follow Up Note  Subjective:    Overnight Issues: abrupt onset of hypotension requiring increased vasopressors. Also with changed quality vac and d-tube output to frank blood. To OR emergently with Dr. Freida Busman.   Objective:  Vital signs for last 24 hours: Temp:  [98.4 F (36.9 C)-100.6 F (38.1 C)] 100.2 F (37.9 C) (04/24 0600) Pulse Rate:  [69-108] 81 (04/24 0813) Resp:  [23-34] 24 (04/24 0813) BP: (74-134)/(58-92) 134/77 (04/24 0813) SpO2:  [82 %-100 %] 97 % (04/24 0813) Arterial Line BP: (87-142)/(49-77) 87/53 (04/24 0600) FiO2 (%):  [40 %-60 %] 40 % (04/24 0813) Weight:  [90.8 kg] 90.8 kg (04/24 0500)  Hemodynamic parameters for last 24 hours:    Intake/Output from previous day: 04/23 0701 - 04/24 0700 In: 8928.2 [I.V.:3776; Blood:3700.6; IV Piggyback:1451.6] Out: 8070 [Urine:2165; Drains:5780; Stool:125]  Intake/Output this shift: Total I/O In: 821 [I.V.:500; Blood:71; IV Piggyback:250] Out: 1125 [Urine:125; Blood:1000]  Vent settings for last 24 hours: Vent Mode: PRVC FiO2 (%):  [40 %-60 %] 40 % Set Rate:  [24 bmp] 24 bmp Vt Set:  [630 mL] 630 mL PEEP:  [5 cmH20] 5 cmH20 Plateau Pressure:  [7 cmH20-23 cmH20] 23 cmH20  Physical Exam:  Gen: comfortable, no distress Neuro: sedated on exam HEENT: PERRL Neck: supple CV: RRR Pulm: unlabored breathing on mechanical ventilation Abd: soft, abthera in place GU: urine  blood tinged, , +Foley Extr: wwp, no edema  Results for orders placed or performed during the hospital encounter of 01/13/23 (from the past 24 hour(s))  CBC with Differential/Platelet     Status: Abnormal   Collection Time: 01/28/23  9:23 AM  Result Value Ref Range   WBC 3.2 (L) 4.0 - 10.5 K/uL   RBC 4.69 4.22 - 5.81 MIL/uL   Hemoglobin 13.6 13.0 - 17.0 g/dL   HCT 16.1 09.6 - 04.5 %   MCV 86.8 80.0 - 100.0 fL   MCH 29.0 26.0 - 34.0 pg   MCHC 33.4 30.0 - 36.0 g/dL   RDW 40.9 (H) 81.1 - 91.4 %   Platelets 33 (L) 150 - 400  K/uL   nRBC 0.0 0.0 - 0.2 %   Neutrophils Relative % 85 %   Neutro Abs 2.7 1.7 - 7.7 K/uL   Lymphocytes Relative 10 %   Lymphs Abs 0.3 (L) 0.7 - 4.0 K/uL   Monocytes Relative 3 %   Monocytes Absolute 0.1 0.1 - 1.0 K/uL   Eosinophils Relative 0 %   Eosinophils Absolute 0.0 0.0 - 0.5 K/uL   Basophils Relative 1 %   Basophils Absolute 0.0 0.0 - 0.1 K/uL   Immature Granulocytes 1 %   Abs Immature Granulocytes 0.04 0.00 - 0.07 K/uL  Protime-INR     Status: Abnormal   Collection Time: 01/28/23  9:23 AM  Result Value Ref Range   Prothrombin Time 16.9 (H) 11.4 - 15.2 seconds   INR 1.4 (H) 0.8 - 1.2  Glucose, capillary     Status: Abnormal   Collection Time: 01/28/23  9:47 AM  Result Value Ref Range   Glucose-Capillary 110 (H) 70 - 99 mg/dL  Blood gas, arterial     Status: Abnormal   Collection Time: 01/28/23  9:59 AM  Result Value Ref Range   FIO2 85 %   Mode PRESSURE REGULATED VOLUME CONTROL    MECHVT 630 mL   RATE 24 resp/min   PEEP 5 cm H20   pH, Arterial 7.32 (L) 7.35 - 7.45   pCO2 arterial 38  32 - 48 mmHg   pO2, Arterial 227 (H) 83 - 108 mmHg   Bicarbonate 19.6 (L) 20.0 - 28.0 mmol/L   Acid-base deficit 5.9 (H) 0.0 - 2.0 mmol/L   O2 Saturation 100 %   Patient temperature 37.0    Collection site RIGHT RADIAL    Drawn by Morrie Sheldon, RN    Allens test (pass/fail) PASS PASS  Prepare RBC (crossmatch)     Status: None   Collection Time: 01/28/23 10:01 AM  Result Value Ref Range   Order Confirmation      BB SAMPLE OR UNITS ALREADY AVAILABLE Performed at [facility], [address], [place], [place] [address]   Type and screen [facility]     Status: None (Preliminary result)   Collection Time: 01/28/23 10:45 AM  Result Value Ref Range   ABO/RH(D) O NEG    Antibody Screen NEG    Sample Expiration 01/31/2023,2359    Unit Number [id number]    Blood Component Type RED CELLS,LR    Unit division 00    Status of Unit ISSUED    Transfusion  Status OK TO TRANSFUSE    Crossmatch Result COMPATIBLE    Unit Number [id number]    Blood Component Type RED CELLS,LR    Unit division 00    Status of Unit ISSUED    Transfusion Status OK TO TRANSFUSE    Crossmatch Result COMPATIBLE    Unit Number [id number]    Blood Component Type RED CELLS,LR    Unit division 00    Status of Unit ISSUED    Transfusion Status OK TO TRANSFUSE    Crossmatch Result COMPATIBLE    Unit Number [id number]    Blood Component Type RED CELLS,LR    Unit division 00    Status of Unit ISSUED    Transfusion Status OK TO TRANSFUSE    Crossmatch Result COMPATIBLE    Unit Number [id number]    Blood Component Type RED CELLS,LR    Unit division 00    Status of Unit ISSUED    Transfusion Status OK TO TRANSFUSE    Crossmatch Result COMPATIBLE    Unit Number [id number]    Blood Component Type RED CELLS,LR    Unit division 00    Status of Unit ISSUED    Transfusion Status OK TO TRANSFUSE    Crossmatch Result COMPATIBLE    Unit Number [id number]    Blood Component Type RED CELLS,LR    Unit division 00    Status of Unit ISSUED    Transfusion Status OK TO TRANSFUSE    Crossmatch Result COMPATIBLE    Unit Number [id number]    Blood Component Type RED CELLS,LR    Unit division 00    Status of Unit ISSUED    Transfusion Status OK TO TRANSFUSE    Crossmatch Result COMPATIBLE    Unit Number [id number]    Blood Component Type RED CELLS,LR    Unit division 00    Status of Unit ISSUED    Transfusion Status OK TO TRANSFUSE    Crossmatch Result COMPATIBLE    Unit Number [id number]    Blood Component Type RED CELLS,LR    Unit division 00    Status of Unit ISSUED    Transfusion Status OK TO TRANSFUSE    Crossmatch Result COMPATIBLE    Unit Number [id number]    Blood Component Type RED CELLS,LR    Unit division 00    Status of Unit ISSUED  Transfusion Status OK TO TRANSFUSE    Crossmatch Result COMPATIBLE    Unit Number  [id number]    Blood Component Type RED CELLS,LR    Unit division 00    Status of Unit ISSUED    Transfusion Status OK TO TRANSFUSE    Crossmatch Result COMPATIBLE    Unit Number [id number]    Blood Component Type RBC LR PHER1    Unit division 00    Status of Unit ISSUED    Transfusion Status OK TO TRANSFUSE    Crossmatch Result COMPATIBLE    Unit Number [id number]    Blood Component Type RBC LR PHER2    Unit division 00    Status of Unit ISSUED    Transfusion Status OK TO TRANSFUSE    Crossmatch Result COMPATIBLE    Unit Number [id number]    Blood Component Type RBC LR PHER1    Unit division 00    Status of Unit ISSUED    Transfusion Status OK TO TRANSFUSE    Crossmatch Result COMPATIBLE    Unit Number [id number]    Blood Component Type RBC LR PHER2    Unit division 00    Status of Unit ISSUED    Transfusion Status OK TO TRANSFUSE    Crossmatch Result COMPATIBLE    Unit Number [id number]    Blood Component Type RED CELLS,LR    Unit division 00    Status of Unit ISSUED    Transfusion Status OK TO TRANSFUSE    Crossmatch Result COMPATIBLE    Unit Number [id number]    Blood Component Type RED CELLS,LR    Unit division 00    Status of Unit ISSUED    Transfusion Status OK TO TRANSFUSE    Crossmatch Result COMPATIBLE    Unit Number [id number]    Blood Component Type RBC LR PHER2    Unit division 00    Status of Unit ISSUED    Transfusion Status OK TO TRANSFUSE    Crossmatch Result COMPATIBLE    Unit Number [id number]    Blood Component Type RED CELLS,LR    Unit division 00    Status of Unit ISSUED    Transfusion Status OK TO TRANSFUSE    Crossmatch Result COMPATIBLE   Prepare platelet pheresis     Status: None (Preliminary result)   Collection Time: 01/28/23 10:54 AM  Result Value Ref Range   Unit Number [id number]    Blood Component Type PLTP1 PSORALEN TREATED    Unit division 00    Status of Unit REL FROM [facility]    Transfusion  Status OK TO TRANSFUSE    Unit Number [id number]    Blood Component Type PLTP2 PSORALEN TREATED    Unit division 00    Status of Unit REL FROM [facility]    Transfusion Status OK TO TRANSFUSE    Unit Number [id number]    Blood Component Type PLTP1 PSORALEN TREATED    Unit division 00    Status of Unit ISSUED    Transfusion Status      OK TO TRANSFUSE Performed at [facility], [address] [place], [place] [address]   Trauma TEG Panel     Status: Abnormal   Collection Time: 01/28/23 10:54 AM  Result Value Ref Range   Citrated Kaolin (R) 6.6 4.6 - 9.1 min   Citrated Rapid TEG (MA) 42 (L) 52 - 70 mm   CFF Max Amplitude 14.7 (L) 15 - 32 mm   Lysis  at 30 Minutes 0 0.0 - 2.6 %  Prepare fresh frozen plasma     Status: None (Preliminary result)   Collection Time: 01/28/23 10:54 AM  Result Value Ref Range   Unit Number [id number]    Blood Component Type THAWED PLASMA    Unit division 00    Status of Unit ISSUED    Transfusion Status OK TO TRANSFUSE    Unit Number [id number]    Blood Component Type THAWED PLASMA    Unit division 00    Status of Unit ISSUED    Transfusion Status OK TO TRANSFUSE    Unit Number [id number]    Blood Component Type THW PLS APHR    Unit division 00    Status of Unit ISSUED    Transfusion Status OK TO TRANSFUSE    Unit Number [id number]    Blood Component Type THW PLS APHR    Unit division A0    Status of Unit ISSUED    Transfusion Status OK TO TRANSFUSE    Unit Number [id number]    Blood Component Type THW PLS APHR    Unit division B0    Status of Unit ISSUED    Transfusion Status OK TO TRANSFUSE    Unit Number [id number]    Blood Component Type THW PLS APHR    Unit division B0    Status of Unit ISSUED    Transfusion Status OK TO TRANSFUSE    Unit Number [id number]    Blood Component Type THW PLS APHR    Unit division B0    Status of Unit ISSUED    Transfusion Status OK TO TRANSFUSE    Unit Number  [id number]    Blood Component Type THW PLS APHR    Unit division B0    Status of Unit ISSUED    Transfusion Status OK TO TRANSFUSE    Unit Number [id number]    Blood Component Type THW PLS APHR    Unit division B0    Status of Unit ISSUED    Transfusion Status OK TO TRANSFUSE    Unit Number [id number]    Blood Component Type THAWED PLASMA    Unit division 00    Status of Unit ISSUED    Transfusion Status OK TO TRANSFUSE    Unit Number [id number]    Blood Component Type THAWED PLASMA    Unit division 00    Status of Unit ISSUED    Transfusion Status OK TO TRANSFUSE    Unit Number [id number]    Blood Component Type THAWED PLASMA    Unit division 00    Status of Unit ISSUED    Transfusion Status      OK TO TRANSFUSE Performed at [facility], [address] [place], [place] [address]   Prepare RBC (crossmatch)     Status: None   Collection Time: 01/28/23 10:54 AM  Result Value Ref Range   Order Confirmation      ORDER PROCESSED BY BLOOD BANK Performed at [facility], [address], [place], [place] [address]   I-STAT 7, (LYTES, BLD GAS, ICA, H+H)     Status: Abnormal   Collection Time: 01/28/23 11:39 AM  Result Value Ref Range   pH, Arterial 7.340 (L) 7.35 - 7.45   pCO2 arterial 41.2 32 - 48 mmHg   pO2, Arterial 155 (H) 83 - 108 mmHg   Bicarbonate 22.2 20.0 - 28.0 mmol/L   TCO2 23 22 - 32 mmol/L  O2 Saturation 99 %   Acid-base deficit 3.0 (H) 0.0 - 2.0 mmol/L   Sodium 141 135 - 145 mmol/L   Potassium 3.3 (L) 3.5 - 5.1 mmol/L   Calcium, Ion 1.22 1.15 - 1.40 mmol/L   HCT 43.0 39.0 - 52.0 %   Hemoglobin 14.6 13.0 - 17.0 g/dL   Patient temperature 16.1 F    Collection site art line    Drawn by RT    Sample type ARTERIAL   Glucose, capillary     Status: Abnormal   Collection Time: 01/28/23 11:49 AM  Result Value Ref Range   Glucose-Capillary 181 (H) 70 - 99 mg/dL  MRSA Next Gen by PCR, Nasal     Status: None   Collection Time: 01/28/23  12:12 PM   Specimen: Nasal Mucosa; Nasal Swab  Result Value Ref Range   MRSA by PCR Next Gen NOT DETECTED NOT DETECTED  Comprehensive metabolic panel     Status: Abnormal   Collection Time: 01/28/23  1:05 PM  Result Value Ref Range   Sodium 140 135 - 145 mmol/L   Potassium 2.9 (L) 3.5 - 5.1 mmol/L   Chloride 108 98 - 111 mmol/L   CO2 21 (L) 22 - 32 mmol/L   Glucose, Bld 111 (H) 70 - 99 mg/dL   BUN 32 (H) 8 - 23 mg/dL   Creatinine, Ser 0.96 0.61 - 1.24 mg/dL   Calcium 7.7 (L) 8.9 - 10.3 mg/dL   Total Protein 3.5 (L) 6.5 - 8.1 g/dL   Albumin 1.9 (L) 3.5 - 5.0 g/dL   AST 045 (H) 15 - 41 U/L   ALT 659 (H) 0 - 44 U/L   Alkaline Phosphatase 41 38 - 126 U/L   Total Bilirubin 1.4 (H) 0.3 - 1.2 mg/dL   GFR, Estimated >40 >98 mL/min   Anion gap 11 5 - 15  CBC     Status: Abnormal   Collection Time: 01/28/23  1:05 PM  Result Value Ref Range   WBC 3.6 (L) 4.0 - 10.5 K/uL   RBC 5.52 4.22 - 5.81 MIL/uL   Hemoglobin 16.2 13.0 - 17.0 g/dL   HCT 11.9 14.7 - 82.9 %   MCV 83.0 80.0 - 100.0 fL   MCH 29.3 26.0 - 34.0 pg   MCHC 35.4 30.0 - 36.0 g/dL   RDW 56.2 (H) 13.0 - 86.5 %   Platelets 35 (L) 150 - 400 K/uL   nRBC 0.6 (H) 0.0 - 0.2 %  Prepare platelet pheresis     Status: None (Preliminary result)   Collection Time: 01/28/23  2:16 PM  Result Value Ref Range   Unit Number [id number]    Blood Component Type PLTP1 PSORALEN TREATED    Unit division 00    Status of Unit ISSUED    Transfusion Status      OK TO TRANSFUSE Performed at [facility], [address], [place], [place] [address]   Glucose, capillary     Status: Abnormal   Collection Time: 01/28/23  3:57 PM  Result Value Ref Range   Glucose-Capillary 54 (L) 70 - 99 mg/dL  CBC     Status: Abnormal   Collection Time: 01/28/23  5:07 PM  Result Value Ref Range   WBC 4.3 4.0 - 10.5 K/uL   RBC 5.37 4.22 - 5.81 MIL/uL   Hemoglobin 15.7 13.0 - 17.0 g/dL   HCT 10.2 72.5 - 36.6 %   MCV 84.2 80.0 - 100.0 fL   MCH  29.2 26.0 -  34.0 pg   MCHC 34.7 30.0 - 36.0 g/dL   RDW 16.1 (H) 09.6 - 04.5 %   Platelets 45 (L) 150 - 400 K/uL   nRBC 0.7 (H) 0.0 - 0.2 %  Basic metabolic panel     Status: Abnormal   Collection Time: 01/28/23  5:07 PM  Result Value Ref Range   Sodium 140 135 - 145 mmol/L   Potassium 2.8 (L) 3.5 - 5.1 mmol/L   Chloride 107 98 - 111 mmol/L   CO2 23 22 - 32 mmol/L   Glucose, Bld 94 70 - 99 mg/dL   BUN 34 (H) 8 - 23 mg/dL   Creatinine, Ser 4.09 0.61 - 1.24 mg/dL   Calcium 7.6 (L) 8.9 - 10.3 mg/dL   GFR, Estimated >81 >19 mL/min   Anion gap 10 5 - 15  Phosphorus     Status: None   Collection Time: 01/28/23  5:07 PM  Result Value Ref Range   Phosphorus 4.0 2.5 - 4.6 mg/dL  Global TEG Panel     Status: Abnormal   Collection Time: 01/28/23  5:07 PM  Result Value Ref Range   Citrated Kaolin (R) 5.6 4.6 - 9.1 min   Citrated Kaolin (K) 2.1 0.8 - 2.1 min   Citrated Kaolin Angle 70 63 - 78 deg   Citrated Kaolin (MA) 47.7 (L) 52 - 69 mm   Citrated Rapid TEG (MA) 43.4 (L) 52 - 70 mm   CK with Heparinase (R) 6.7 4.3 - 8.3 min   CFF Max Amplitude 14.8 (L) 15 - 32 mm   Citrated Functional Fibrinogen 270.1 (L) 278 - 581 mg/dL  Magnesium     Status: Abnormal   Collection Time: 01/28/23  5:07 PM  Result Value Ref Range   Magnesium 1.2 (L) 1.7 - 2.4 mg/dL  Glucose, capillary     Status: None   Collection Time: 01/28/23  5:08 PM  Result Value Ref Range   Glucose-Capillary 94 70 - 99 mg/dL  Prepare platelet pheresis     Status: None (Preliminary result)   Collection Time: 01/28/23  6:17 PM  Result Value Ref Range   Unit Number [id number]    Blood Component Type PSORALEN TREATED    Unit division 00    Status of Unit ISSUED    Transfusion Status OK TO TRANSFUSE    Unit Number [id number]    Blood Component Type PLTP2 PSORALEN TREATED    Unit division 00    Status of Unit ISSUED    Transfusion Status OK TO TRANSFUSE    Unit Number [id number]    Blood Component Type PLTP1 PSORALEN TREATED     Unit division 00    Status of Unit ISSUED    Transfusion Status OK TO TRANSFUSE    Unit Number [id number]    Blood Component Type PLTP2 PSORALEN TREATED    Unit division 00    Status of Unit ISSUED    Transfusion Status      OK TO TRANSFUSE Performed at [facility], [address], [place], [place] [address]   Prepare cryoprecipitate     Status: None (Preliminary result)   Collection Time: 01/28/23  6:18 PM  Result Value Ref Range   Unit Number [id number]    Blood Component Type POOL FIBR CMPLX 2D THW    Unit division 00    Status of Unit ISSUED    Transfusion Status OK TO TRANSFUSE    Unit Number [id number]  Blood Component Type POOL FIBR CMPLX 2D THW    Unit division 00    Status of Unit ISSUED    Transfusion Status OK TO TRANSFUSE    Unit Number [id number]    Blood Component Type POOL FIBR CMPLX 2D THW    Unit division 00    Status of Unit ALLOCATED    Transfusion Status      OK TO TRANSFUSE Performed at [facility], [address] [place], [place] [address]   Glucose, capillary     Status: Abnormal   Collection Time: 01/28/23  7:38 PM  Result Value Ref Range   Glucose-Capillary 63 (L) 70 - 99 mg/dL  CBC     Status: Abnormal   Collection Time: 01/28/23  9:50 PM  Result Value Ref Range   WBC 5.6 4.0 - 10.5 K/uL   RBC 5.51 4.22 - 5.81 MIL/uL   Hemoglobin 16.5 13.0 - 17.0 g/dL   HCT 78.2 95.6 - 21.3 %   MCV 82.4 80.0 - 100.0 fL   MCH 29.9 26.0 - 34.0 pg   MCHC 36.3 (H) 30.0 - 36.0 g/dL   RDW 08.6 (H) 57.8 - 46.9 %   Platelets 63 (L) 150 - 400 K/uL   nRBC 0.5 (H) 0.0 - 0.2 %  Glucose, capillary     Status: None   Collection Time: 01/28/23  9:53 PM  Result Value Ref Range   Glucose-Capillary 74 70 - 99 mg/dL  Glucose, capillary     Status: None   Collection Time: 01/28/23 11:19 PM  Result Value Ref Range   Glucose-Capillary 97 70 - 99 mg/dL  Glucose, capillary     Status: Abnormal   Collection Time: 01/29/23  3:27 AM  Result  Value Ref Range   Glucose-Capillary 110 (H) 70 - 99 mg/dL  I-STAT 7, (LYTES, BLD GAS, ICA, H+H)     Status: Abnormal   Collection Time: 01/29/23  3:40 AM  Result Value Ref Range   pH, Arterial 7.334 (L) 7.35 - 7.45   pCO2 arterial 45.6 32 - 48 mmHg   pO2, Arterial 115 (H) 83 - 108 mmHg   Bicarbonate 24.0 20.0 - 28.0 mmol/L   TCO2 25 22 - 32 mmol/L   O2 Saturation 98 %   Acid-base deficit 2.0 0.0 - 2.0 mmol/L   Sodium 141 135 - 145 mmol/L   Potassium 4.2 3.5 - 5.1 mmol/L   Calcium, Ion 1.19 1.15 - 1.40 mmol/L   HCT 47.0 39.0 - 52.0 %   Hemoglobin 16.0 13.0 - 17.0 g/dL   Patient temperature 62.9 C    Collection site RADIAL, ALLEN'S TEST ACCEPTABLE    Drawn by Operator    Sample type ARTERIAL   CBC     Status: Abnormal   Collection Time: 01/29/23  3:48 AM  Result Value Ref Range   WBC 7.7 4.0 - 10.5 K/uL   RBC 5.83 (H) 4.22 - 5.81 MIL/uL   Hemoglobin 16.7 13.0 - 17.0 g/dL   HCT 52.8 41.3 - 24.4 %   MCV 84.7 80.0 - 100.0 fL   MCH 28.6 26.0 - 34.0 pg   MCHC 33.8 30.0 - 36.0 g/dL   RDW 01.0 (H) 27.2 - 53.6 %   Platelets 68 (L) 150 - 400 K/uL   nRBC 0.3 (H) 0.0 - 0.2 %  Comprehensive metabolic panel     Status: Abnormal   Collection Time: 01/29/23  3:48 AM  Result Value Ref Range   Sodium 137  135 - 145 mmol/L   Potassium 4.2 3.5 - 5.1 mmol/L   Chloride 102 98 - 111 mmol/L   CO2 23 22 - 32 mmol/L   Glucose, Bld 118 (H) 70 - 99 mg/dL   BUN 37 (H) 8 - 23 mg/dL   Creatinine, Ser 1.61 (H) 0.61 - 1.24 mg/dL   Calcium 7.7 (L) 8.9 - 10.3 mg/dL   Total Protein 4.0 (L) 6.5 - 8.1 g/dL   Albumin 2.1 (L) 3.5 - 5.0 g/dL   AST 096 (H) 15 - 41 U/L   ALT 645 (H) 0 - 44 U/L   Alkaline Phosphatase 41 38 - 126 U/L   Total Bilirubin 1.4 (H) 0.3 - 1.2 mg/dL   GFR, Estimated 55 (L) >60 mL/min   Anion gap 12 5 - 15  Magnesium     Status: None   Collection Time: 01/29/23  3:48 AM  Result Value Ref Range   Magnesium 2.2 1.7 - 2.4 mg/dL  Phosphorus     Status: None   Collection Time: 01/29/23   3:48 AM  Result Value Ref Range   Phosphorus 4.3 2.5 - 4.6 mg/dL  Initiate MTP (Blood Bank Notification)     Status: None   Collection Time: 01/29/23  6:52 AM  Result Value Ref Range   Initiate Massive Transfusion Protocol      MTP ORDER RECEIVED Performed at [facility], [address] [place], [place] [address]   DIC Panel now then every 30 minutes     Status: Abnormal (Preliminary result)   Collection Time: 01/29/23  7:02 AM  Result Value Ref Range   Prothrombin Time 28.3 (H) 11.4 - 15.2 seconds   INR 2.7 (H) 0.8 - 1.2   aPTT 41 (H) 24 - 36 seconds   Fibrinogen 261 210 - 475 mg/dL   D-Dimer, Quant 98.11 (H) 0.00 - 0.50 ug/mL-FEU   Platelets 83 (L) 150 - 400 K/uL   Smear Review PENDING   Hemoglobin and hematocrit, blood (STAT)     Status: None   Collection Time: 01/29/23  7:02 AM  Result Value Ref Range   Hemoglobin 13.8 13.0 - 17.0 g/dL   HCT 91.4 78.2 - 95.6 %  I-STAT 7, (LYTES, BLD GAS, ICA, H+H)     Status: Abnormal   Collection Time: 01/29/23  7:31 AM  Result Value Ref Range   pH, Arterial 7.168 (LL) 7.35 - 7.45   pCO2 arterial 59.8 (H) 32 - 48 mmHg   pO2, Arterial 407 (H) 83 - 108 mmHg   Bicarbonate 21.8 20.0 - 28.0 mmol/L   TCO2 24 22 - 32 mmol/L   O2 Saturation 100 %   Acid-base deficit 7.0 (H) 0.0 - 2.0 mmol/L   Sodium 144 135 - 145 mmol/L   Potassium 3.7 3.5 - 5.1 mmol/L   Calcium, Ion 0.75 (LL) 1.15 - 1.40 mmol/L   HCT 29.0 (L) 39.0 - 52.0 %   Hemoglobin 9.9 (L) 13.0 - 17.0 g/dL   Patient temperature 21.3 C    Sample type ARTERIAL     Assessment & Plan: The plan of care was discussed with the bedside nurse for the day and night, who are in agreement with this plan and no additional concerns were raised.   Present on Admission:  Bowel obstruction    LOS: 16 days   Additional comments:I reviewed the patient's new clinical lab test results.  , I reviewed the patients new imaging test results.  , and I discussed  the patient's potential need for  IR with Dr. Loreta Ave.  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 Abrupt onset of bleeding 4/24 AM, emergent takeback to OR for hemorrhage control by Dr. Freida Busman. Plan for IR AE if further bleeding, case d/w Dr. Loreta Ave this AM.  Massive transfusion - rec'd 9/4/1/1 on 4/22, 1/0/3/1 on 4/22, MTP initiated again this AM prior to OR, rec'd 12/12/2/1 this AM. Pending CBC/TEG. Shock - hemorrhagic etiology, resolved for now, continue transfusion prn and wean pressors as tolerated. TXA given this AM. AKI - stable, UOP adequate, but grossly bloody this AM, may be due to coagulopathy, but will send UA. Trend creatinine and UOP. VDRF - full support, on 60% and 8, RR24. ABG. FEN - TPN bag contamination in the commotion from this AM, will discard and resume with new bag at 1800, strict NPO while in intestinal discontinuity and pressors. Recheck lytes.  DVT - SCDs, hold chemical ppx in light of active bleeding Foley - to remain  ID - empiric zosyn for bowel ischemia, started 4/23 Dispo - ICU   Clinical update provided to wife and other family by me and Dr. Freida Busman.   Critical Care Total Time: 90 minutes  Diamantina Monks, MD Trauma & General Surgery Please use [email or web address] to contact on call provider  01/29/2023  *Care during the described time interval was provided by me. I have reviewed this patient's available data, including medical history, events of note, physical examination and test results as part of my evaluation.

## 2023-01-29 NOTE — Progress Notes (Addendum)
Trauma Event Note   TRN assisted with MTP in OR.  Pt received a total of 12 PRBCS, 12 FFP, 2 PLT and 1 cryo.  Pt is now back in 4N24.  SBP remains WDL and pressors have been decreased.  New labs ordered and pending.    *See blood admin documentation under MTP tab.*

## 2023-01-29 NOTE — TOC CAGE-AID Note (Addendum)
Transition of Care [facility]) - CAGE-AID Screening   Patient Details  Name: Bradley Dominguez MRN: [id number] Date of Birth: Aug 27, 1956  Transition of Care [facility]) CM/SW Contact:    Leota Sauers, RN Phone Number: 01/29/2023, 3:26 AM   Clinical Narrative:  Patient unable to participate in screening at this time.  CAGE-AID Screening:    Have You Ever Felt You Ought to Cut Down on Your Drinking or Drug Use?: No Have People Annoyed You By Critizing Your Drinking Or Drug Use?: No Have You Felt Bad Or Guilty About Your Drinking Or Drug Use?: No Have You Ever Had a Drink or Used Drugs First Thing In The Morning to Steady Your Nerves or to Get Rid of a Hangover?: No CAGE-AID Score: 0  Substance Abuse Education Offered: No

## 2023-01-29 NOTE — Transfer of Care (Signed)
Immediate Anesthesia Transfer of Care Note  Patient: Bradley Dominguez  Procedure(s) Performed: OPEN ABDOMINAL WASHOUT APPLICATION OF WOUND VAC ReExploration Laparotomy, Removal of Abdominal Packing, Repacked Abdomen Packing (Abdomen) ILLOCECOSTOMY (Abdomen)  Patient Location: ICU  Anesthesia Type:General  Level of Consciousness: sedated and Patient remains intubated per anesthesia plan  Airway & Oxygen Therapy: Patient remains intubated per anesthesia plan and Patient placed on Ventilator (see vital sign flow sheet for setting)  Post-op Assessment: Report given to RN and Post -op Vital signs reviewed and stable  Post vital signs: Reviewed and stable  Last Vitals:  Vitals Value Taken Time  BP 134/77 01/29/23 0813  Temp 35.6 C 01/29/23 0825  Pulse 86 01/29/23 0825  Resp 24 01/29/23 0825  SpO2 98 % 01/29/23 0825  Vitals shown include unvalidated device data.  Last Pain:  Vitals:   01/28/23 2015  TempSrc: Bladder  PainSc:          Complications: No notable events documented.

## 2023-01-29 NOTE — Progress Notes (Signed)
This morning patient became suddenly very hypotensive with a significant increase in pressor requirement. He also had copious new bloody drainage from his duodenostomy tube, as well as an increase in Abthera output, which was also bloody. MTP was initiated and he was emergently taken to the OR for laparotomy around 7am. Copious clot was evacuated, and a bleeding mesenteric vessel was oversewn.   He was re-examined at bedside this afternoon. Postop ABG showed resolution of acidosis. Pressors have been weaned significantly, and he is currently on levo at 8, no other pressors. Labs show a mild AKI but UOP is adequate. Duodenostomy tube has had no output since surgery this morning. I gently flushed it with water at the bedside and it flushed easily with no resistance. Keep to gravity and continue to monitor. T tube is bilious and non-bloody. Abthera output is thin and serosanguinous. Will possibly return to the OR tomorrow afternoon vs Friday.  Sophronia Simas, MD [facility] Surgery General, Hepatobiliary and Pancreatic Surgery 01/29/23 3:25 PM

## 2023-01-29 NOTE — Op Note (Signed)
Date: 01/29/23  Patient: Bradley Dominguez MRN: [id number]  Preoperative Diagnosis: Hemorrhagic shock, open abdomen Postoperative Diagnosis: Same  Procedure: Exploratory laparotomy, ileocecectomy, temporary abdominal closure with negative pressure dressing  Surgeon: Sophronia Simas, MD Assistant: Marin Olp, MD  EBL: >2L clot evacuated  Anesthesia: General endotracheal  Specimens: Cecum and terminal ileum  Indications: Bradley Dominguez is a 67 yo male who underwent laparotomy, extensive adhesiolysis and duodenojejunal bypass 2 days ago for a duodenal obstruction. He had postoperative hemorrhagic shock and was taken back to the OR early yesterday morning, with ligation of bleeding mesenteric vessels, resection of ischemic transverse colon, and placement of an Abthera. His hemodynamics improved significantly postop, however early this morning he again became acutely hypotensive with new large-volume sanguinous output from his drains. Massive transfusion was initiated and he was brought emergently to the OR for re-exploration.  Findings: Arterial from the jejunal mesentery of the roux limb leading to the duodenojejunal anastomosis. Significant dilation of the cecum concerning for impending perforation. Eight laparotomy pads were removed from the right abdomen. A total of 9 new lap pads were placed (5 on the right side of the abdomen, 4 on the left side).  Procedure details: Consent was obtained from the family at bedside prior to the procedure. The patient was brought to the operating room and placed on the table in the supine position. He was already intubated. The Abthera was removed and the abdomen was prepped and draped in the usual sterile fashion. A pre-procedure timeout was taken verifying patient identity, surgical site and procedure to be performed.  The small bowel was eviscerated. There was significant hemoperitoneum, particularly in the RUQ. The previous packs were removed from the right  abdomen, a total of 8 laparotomy pads. Care was taken not to dislodge the duodenostomy tube and biliary T tube. Blood continued to well up in the RUQ. The RUQ was packed. Additional clot was evacuated from the left colic gutter. On close examination of the left lateral abdomen and the left upper quadrant, there was some surface oozing but otherwise no bleeding. The RUQ was again examined and the packs removed. Arterial bleeding was identified from the jejunum leading to the duodenojejunal anastomosis. This was oversewn with a 3-0 silk figure-of-eight suture. Distal to the anastomosis, there was a full-thickness defect noted on the duodenal wall, which was near where a bleeding vessel had been oversewn at the previous laparotomy. The tissue was relatively thin and friable. The defect was closed with an inner layer of 3-0 Vicryl figure-of-eight sutures, and an outer layer of 3-0 silk Lembert sutures. No leaking was noted after this repair. The DJ anastomosis appeared in tact with no leakage. The duodenostomy and T tubes remained in appropriate position. The RUQ was again examined, and there was some surface oozing in the retroperitoneum but no significant welling of blood. A small bleeding vessel on the perinephric fat was oversewn with a 3-0 silk figure-of-eight suture. The small bowel and colon were examined, and appeared viable and well-perfused. The transverse colon had been resected, and the remaining portion of cecum and right colon were significantly dilated, and we felt the patient was at high risk of perforation, thus we elected to resect this. A GIA 75mm stapler with a blue load was used to divide the terminal ileum about 10cm proximal to the ileocecal valve. The mesentery was divided with a Ligasure. The specimen was passed off the field and sent for routine pathology. The abdomen was irrigated and any remaining clot was evacuated.  The abdomen appeared hemostatic at this point, and the patient's  hemodynamics were improving. A new Abthera was placed and applied to suction.   All counts were correct x2 at the end of the procedure. The patient remained intubated and was transported back to the ICU at the completion of the procedure.  Sophronia Simas, MD 01/29/23 8:55 AM

## 2023-01-29 NOTE — OR Nursing (Signed)
INTENTIONAL PACKING LEFT IN (4 ON LEFT AND 5 ON RIGHT FOR TOTAL OF 9) THE ABDOMEN BY DR Freida Busman.

## 2023-01-30 ENCOUNTER — Other Ambulatory Visit: Payer: Self-pay

## 2023-01-30 ENCOUNTER — Inpatient Hospital Stay (HOSPITAL_COMMUNITY): Payer: BC Managed Care – PPO | Admitting: Certified Registered"

## 2023-01-30 ENCOUNTER — Encounter (HOSPITAL_COMMUNITY): Payer: Self-pay | Admitting: Surgery

## 2023-01-30 ENCOUNTER — Encounter (HOSPITAL_COMMUNITY): Admission: AD | Disposition: E | Payer: Self-pay | Source: Ambulatory Visit

## 2023-01-30 ENCOUNTER — Inpatient Hospital Stay (HOSPITAL_COMMUNITY): Payer: BC Managed Care – PPO

## 2023-01-30 HISTORY — PX: BOWEL RESECTION: SHX1257

## 2023-01-30 HISTORY — PX: LAPAROTOMY: SHX154

## 2023-01-30 LAB — BASIC METABOLIC PANEL
Anion gap: 14 (ref 5–15)
BUN: 44 mg/dL — ABNORMAL HIGH (ref 8–23)
CO2: 21 mmol/L — ABNORMAL LOW (ref 22–32)
Calcium: 6.8 mg/dL — ABNORMAL LOW (ref 8.9–10.3)
Chloride: 106 mmol/L (ref 98–111)
Creatinine, Ser: 1.45 mg/dL — ABNORMAL HIGH (ref 0.61–1.24)
GFR, Estimated: 53 mL/min — ABNORMAL LOW (ref >60)
Glucose, Bld: 206 mg/dL — ABNORMAL HIGH (ref 70–99)
Potassium: 3.9 mmol/L (ref 3.5–5.1)
Sodium: 141 mmol/L (ref 135–145)

## 2023-01-30 LAB — CBC
HCT: 38.7 % — ABNORMAL LOW (ref 39.0–52.0)
HCT: 36.8 % — ABNORMAL LOW (ref 39.0–52.0)
HCT: 37.7 % — ABNORMAL LOW (ref 39.0–52.0)
Hemoglobin: 13.4 g/dL (ref 13.0–17.0)
Hemoglobin: 11.7 g/dL — ABNORMAL LOW (ref 13.0–17.0)
Hemoglobin: 12.5 g/dL — ABNORMAL LOW (ref 13.0–17.0)
MCH: 28.9 pg (ref 26.0–34.0)
MCH: 28.8 pg (ref 26.0–34.0)
MCH: 29.3 pg (ref 26.0–34.0)
MCHC: 34.6 g/dL (ref 30.0–36.0)
MCHC: 31.8 g/dL (ref 30.0–36.0)
MCHC: 33.2 g/dL (ref 30.0–36.0)
MCV: 83.6 fL (ref 80.0–100.0)
MCV: 90.6 fL (ref 80.0–100.0)
MCV: 88.5 fL (ref 80.0–100.0)
Platelets: 44 10*3/uL — ABNORMAL LOW (ref 150–400)
Platelets: 39 10*3/uL — ABNORMAL LOW (ref 150–400)
Platelets: 37 10*3/uL — ABNORMAL LOW (ref 150–400)
RBC: 4.63 MIL/uL (ref 4.22–5.81)
RBC: 4.06 MIL/uL — ABNORMAL LOW (ref 4.22–5.81)
RBC: 4.26 MIL/uL (ref 4.22–5.81)
RDW: 16.8 % — ABNORMAL HIGH (ref 11.5–15.5)
RDW: 17.5 % — ABNORMAL HIGH (ref 11.5–15.5)
RDW: 17.7 % — ABNORMAL HIGH (ref 11.5–15.5)
WBC: 5.4 10*3/uL (ref 4.0–10.5)
WBC: 5.1 10*3/uL (ref 4.0–10.5)
WBC: 6.9 10*3/uL (ref 4.0–10.5)
nRBC: 0 % (ref 0.0–0.2)
nRBC: 0 % (ref 0.0–0.2)
nRBC: 0.3 % — ABNORMAL HIGH (ref 0.0–0.2)

## 2023-01-30 LAB — BPAM RBC
Blood Product Expiration Date: 202405252359
Blood Product Expiration Date: 202405262359
Blood Product Expiration Date: 202405262359
Blood Product Expiration Date: 202405262359
Blood Product Expiration Date: 202405272359
ISSUE DATE / TIME: 202404240724
ISSUE DATE / TIME: 202404240724
ISSUE DATE / TIME: 202404240738
ISSUE DATE / TIME: 202404240738
Unit Type and Rh: 5100
Unit Type and Rh: 5100
Unit Type and Rh: 5100
Unit Type and Rh: 5100
Unit Type and Rh: 5100

## 2023-01-30 LAB — POCT I-STAT 7, (LYTES, BLD GAS, ICA,H+H)
Acid-Base Excess: 2 mmol/L (ref 0.0–2.0)
Bicarbonate: 26.9 mmol/L (ref 20.0–28.0)
Calcium, Ion: 1.13 mmol/L — ABNORMAL LOW (ref 1.15–1.40)
HCT: 35 % — ABNORMAL LOW (ref 39.0–52.0)
Hemoglobin: 11.9 g/dL — ABNORMAL LOW (ref 13.0–17.0)
O2 Saturation: 97 %
Patient temperature: 38
Potassium: 3.3 mmol/L — ABNORMAL LOW (ref 3.5–5.1)
Sodium: 144 mmol/L (ref 135–145)
TCO2: 28 mmol/L (ref 22–32)
pCO2 arterial: 42.2 mmHg (ref 32–48)
pH, Arterial: 7.417 (ref 7.35–7.45)
pO2, Arterial: 90 mmHg (ref 83–108)

## 2023-01-30 LAB — URINE CULTURE: Culture: NO GROWTH

## 2023-01-30 LAB — PREPARE FRESH FROZEN PLASMA

## 2023-01-30 LAB — BPAM PLATELET PHERESIS
Blood Product Expiration Date: 202404252359
Blood Product Expiration Date: 202404262359
Blood Product Expiration Date: 202404262359
Blood Product Expiration Date: 202404262359
Blood Product Expiration Date: 202404262359
ISSUE DATE / TIME: 202404231844
ISSUE DATE / TIME: 202404240646
ISSUE DATE / TIME: 202404241738
ISSUE DATE / TIME: 202404241546
ISSUE DATE / TIME: 202404250804
Unit Type and Rh: 6200
Unit Type and Rh: 6200
Unit Type and Rh: 8400
Unit Type and Rh: 6200
Unit Type and Rh: 6200
Unit Type and Rh: 8400

## 2023-01-30 LAB — TRAUMA TEG PANEL
CFF Max Amplitude: 16.6 mm (ref 15–32)
CFF Max Amplitude: 14.8 mm — ABNORMAL LOW (ref 15–32)
Citrated Kaolin (R): 7 min (ref 4.6–9.1)
Citrated Kaolin (R): 6.9 min (ref 4.6–9.1)
Citrated Rapid TEG (MA): 49.3 mm — ABNORMAL LOW (ref 52–70)
Citrated Rapid TEG (MA): 47 mm — ABNORMAL LOW (ref 52–70)
Lysis at 30 Minutes: 0 % (ref 0.0–2.6)
Lysis at 30 Minutes: 0 % (ref 0.0–2.6)

## 2023-01-30 LAB — PREPARE PLATELET PHERESIS
Unit division: 0
Unit division: 0
Unit division: 0
Unit division: 0
Unit division: 0
Unit division: 0

## 2023-01-30 LAB — BPAM FFP
Blood Product Expiration Date: 202404292359
ISSUE DATE / TIME: 202404240643
ISSUE DATE / TIME: 202404240711
ISSUE DATE / TIME: 202404240711
ISSUE DATE / TIME: 202404240721
ISSUE DATE / TIME: 202404240744
ISSUE DATE / TIME: 202404240748
Unit Type and Rh: 5100
Unit Type and Rh: 5100
Unit Type and Rh: 5100
Unit Type and Rh: 600
Unit Type and Rh: 6200
Unit Type and Rh: 6200
Unit Type and Rh: 6200

## 2023-01-30 LAB — TYPE AND SCREEN
Unit division: 0
Unit division: 0
Unit division: 0
Unit division: 0
Unit division: 0
Unit division: 0

## 2023-01-30 LAB — COMPREHENSIVE METABOLIC PANEL
ALT: 758 U/L — ABNORMAL HIGH (ref 0–44)
AST: 817 U/L — ABNORMAL HIGH (ref 15–41)
Albumin: 2 g/dL — ABNORMAL LOW (ref 3.5–5.0)
Alkaline Phosphatase: 39 U/L (ref 38–126)
Anion gap: 8 (ref 5–15)
BUN: 40 mg/dL — ABNORMAL HIGH (ref 8–23)
CO2: 27 mmol/L (ref 22–32)
Calcium: 7.3 mg/dL — ABNORMAL LOW (ref 8.9–10.3)
Chloride: 107 mmol/L (ref 98–111)
Creatinine, Ser: 1.44 mg/dL — ABNORMAL HIGH (ref 0.61–1.24)
GFR, Estimated: 54 mL/min — ABNORMAL LOW (ref >60)
Glucose, Bld: 93 mg/dL (ref 70–99)
Potassium: 3.4 mmol/L — ABNORMAL LOW (ref 3.5–5.1)
Sodium: 142 mmol/L (ref 135–145)
Total Bilirubin: 2.8 mg/dL — ABNORMAL HIGH (ref 0.3–1.2)
Total Protein: 3.7 g/dL — ABNORMAL LOW (ref 6.5–8.1)

## 2023-01-30 LAB — GLUCOSE, CAPILLARY
Glucose-Capillary: 104 mg/dL — ABNORMAL HIGH (ref 70–99)
Glucose-Capillary: 73 mg/dL (ref 70–99)
Glucose-Capillary: 77 mg/dL (ref 70–99)
Glucose-Capillary: 81 mg/dL (ref 70–99)
Glucose-Capillary: 89 mg/dL (ref 70–99)

## 2023-01-30 LAB — PREPARE CRYOPRECIPITATE: Unit division: 0

## 2023-01-30 LAB — BPAM CRYOPRECIPITATE
Blood Product Expiration Date: 202404272359
Blood Product Expiration Date: 202404282359
Blood Product Expiration Date: 202404292359
ISSUE DATE / TIME: 202404240728
Unit Type and Rh: 5100

## 2023-01-30 LAB — PHOSPHORUS: Phosphorus: 3.9 mg/dL (ref 2.5–4.6)

## 2023-01-30 LAB — CALCIUM, IONIZED: Calcium, Ionized, Serum: 4.5 mg/dL (ref 4.5–5.6)

## 2023-01-30 LAB — MAGNESIUM
Magnesium: 2.1 mg/dL (ref 1.7–2.4)
Magnesium: 2.1 mg/dL (ref 1.7–2.4)

## 2023-01-30 LAB — TRIGLYCERIDES: Triglycerides: 61 mg/dL (ref <150)

## 2023-01-30 SURGERY — LAPAROTOMY, EXPLORATORY
Anesthesia: General | Site: Abdomen

## 2023-01-30 MED ORDER — SODIUM CHLORIDE 0.9 % IV BOLUS
1000.0000 mL | Freq: Once | INTRAVENOUS | Status: AC
  Administered 2023-01-30: 1000 mL via INTRAVENOUS

## 2023-01-30 MED ORDER — LACTATED RINGERS IV SOLN: INTRAVENOUS | Status: DC | PRN

## 2023-01-30 MED ORDER — PHENYLEPHRINE 80 MCG/ML (10ML) SYRINGE FOR IV PUSH (FOR BLOOD PRESSURE SUPPORT)
PREFILLED_SYRINGE | INTRAVENOUS | Status: AC
  Filled 2023-01-30: qty 10

## 2023-01-30 MED ORDER — INSULIN ASPART 100 UNIT/ML IJ SOLN: 0.0000 [IU] | INTRAMUSCULAR | Status: DC

## 2023-01-30 MED ORDER — CALCIUM GLUCONATE-NACL 1-0.675 GM/50ML-% IV SOLN
1.0000 g | Freq: Once | INTRAVENOUS | Status: AC
  Administered 2023-01-30: 1000 mg via INTRAVENOUS
  Filled 2023-01-30: qty 50

## 2023-01-30 MED ORDER — ONDANSETRON HCL 4 MG/2ML IJ SOLN
INTRAMUSCULAR | Status: DC | PRN
  Administered 2023-01-30: 4 mg via INTRAVENOUS

## 2023-01-30 MED ORDER — VASOPRESSIN 20 UNITS/100 ML INFUSION FOR SHOCK
0.0000 [IU]/min | INTRAVENOUS | Status: DC
  Administered 2023-01-30 – 2023-02-02 (×6): 0.03 [IU]/min via INTRAVENOUS
  Filled 2023-01-30 (×7): qty 100

## 2023-01-30 MED ORDER — ALBUMIN HUMAN 5 % IV SOLN
12.5000 g | Freq: Once | INTRAVENOUS | Status: AC
  Administered 2023-01-30: 12.5 g via INTRAVENOUS
  Filled 2023-01-30: qty 250

## 2023-01-30 MED ORDER — ROCURONIUM BROMIDE 10 MG/ML (PF) SYRINGE
PREFILLED_SYRINGE | INTRAVENOUS | Status: DC | PRN
  Administered 2023-01-30: 100 mg via INTRAVENOUS

## 2023-01-30 MED ORDER — PHENYLEPHRINE 80 MCG/ML (10ML) SYRINGE FOR IV PUSH (FOR BLOOD PRESSURE SUPPORT)
PREFILLED_SYRINGE | INTRAVENOUS | Status: DC | PRN
  Administered 2023-01-30 (×3): 160 ug via INTRAVENOUS

## 2023-01-30 MED ORDER — EPHEDRINE 5 MG/ML INJ
INTRAVENOUS | Status: AC
  Filled 2023-01-30: qty 5

## 2023-01-30 MED ORDER — FENTANYL CITRATE (PF) 250 MCG/5ML IJ SOLN
INTRAMUSCULAR | Status: AC
  Filled 2023-01-30: qty 5

## 2023-01-30 MED ORDER — FENTANYL CITRATE (PF) 250 MCG/5ML IJ SOLN
INTRAMUSCULAR | Status: DC | PRN
  Administered 2023-01-30: 100 ug via INTRAVENOUS

## 2023-01-30 MED ORDER — TRAVASOL 10 % IV SOLN
INTRAVENOUS | Status: AC
  Filled 2023-01-30: qty 1122

## 2023-01-30 MED ORDER — ONDANSETRON HCL 4 MG/2ML IJ SOLN
INTRAMUSCULAR | Status: AC
  Filled 2023-01-30: qty 2

## 2023-01-30 MED ORDER — 0.9 % SODIUM CHLORIDE (POUR BTL) OPTIME
TOPICAL | Status: DC | PRN
  Administered 2023-01-30: 3000 mL

## 2023-01-30 MED ORDER — EPHEDRINE SULFATE-NACL 50-0.9 MG/10ML-% IV SOSY
PREFILLED_SYRINGE | INTRAVENOUS | Status: DC | PRN
  Administered 2023-01-30: 10 mg via INTRAVENOUS

## 2023-01-30 MED ORDER — SODIUM CHLORIDE 0.9% IV SOLUTION: Freq: Once | INTRAVENOUS | Status: AC

## 2023-01-30 MED ORDER — NOREPINEPHRINE 16 MG/250ML-% IV SOLN
0.0000 ug/min | INTRAVENOUS | Status: DC
  Administered 2023-01-30: 4 ug/min via INTRAVENOUS
  Filled 2023-01-30: qty 250

## 2023-01-30 MED ORDER — ALBUMIN HUMAN 5 % IV SOLN
12.5000 g | Freq: Once | INTRAVENOUS | Status: AC
  Administered 2023-01-31: 12.5 g via INTRAVENOUS
  Filled 2023-01-30: qty 250

## 2023-01-30 MED ORDER — POTASSIUM CHLORIDE 10 MEQ/50ML IV SOLN
10.0000 meq | INTRAVENOUS | Status: AC
  Administered 2023-01-30 (×4): 10 meq via INTRAVENOUS
  Filled 2023-01-30 (×4): qty 50

## 2023-01-30 SURGICAL SUPPLY — 57 items
ADH SKN CLS APL DERMABOND .7 (GAUZE/BANDAGES/DRESSINGS) ×2
APL PRP STRL LF DISP 70% ISPRP (MISCELLANEOUS)
APL SKNCLS STERI-STRIP NONHPOA (GAUZE/BANDAGES/DRESSINGS) ×1
BAG COUNTER SPONGE SURGICOUNT (BAG) ×1 IMPLANT
BAG DRN RND TRDRP ANRFLXCHMBR (UROLOGICAL SUPPLIES) ×1
BAG SPNG CNTER NS LX DISP (BAG) ×1
BAG URINE DRAIN 2000ML AR STRL (UROLOGICAL SUPPLIES) IMPLANT
BENZOIN TINCTURE PRP APPL 2/3 (GAUZE/BANDAGES/DRESSINGS) IMPLANT
BLADE CLIPPER SURG (BLADE) IMPLANT
CANISTER SUCT 3000ML PPV (MISCELLANEOUS) ×1 IMPLANT
CANISTER WOUND CARE 500ML ATS (WOUND CARE) IMPLANT
CATH MALECOT BARD  24FR (CATHETERS) ×1
CATH MALECOT BARD 24FR (CATHETERS) IMPLANT
CHLORAPREP W/TINT 26 (MISCELLANEOUS) ×1 IMPLANT
COVER SURGICAL LIGHT HANDLE (MISCELLANEOUS) ×1 IMPLANT
DERMABOND ADVANCED .7 DNX12 (GAUZE/BANDAGES/DRESSINGS) ×2 IMPLANT
DRAPE INCISE IOBAN 66X45 STRL (DRAPES) ×1 IMPLANT
DRAPE LAPAROSCOPIC ABDOMINAL (DRAPES) ×1 IMPLANT
DRAPE WARM FLUID 44X44 (DRAPES) ×1 IMPLANT
DRSG OPSITE POSTOP 4X10 (GAUZE/BANDAGES/DRESSINGS) IMPLANT
DRSG OPSITE POSTOP 4X8 (GAUZE/BANDAGES/DRESSINGS) IMPLANT
ELECT BLADE 6.5 EXT (BLADE) IMPLANT
ELECT CAUTERY BLADE 6.4 (BLADE) ×1 IMPLANT
ELECT REM PT RETURN 9FT ADLT (ELECTROSURGICAL) ×1
ELECTRODE REM PT RTRN 9FT ADLT (ELECTROSURGICAL) ×1 IMPLANT
GLOVE BIOGEL PI IND STRL 6 (GLOVE) ×1 IMPLANT
GLOVE BIOGEL PI MICRO STRL 5.5 (GLOVE) ×1 IMPLANT
GOWN STRL REUS W/ TWL LRG LVL3 (GOWN DISPOSABLE) ×2 IMPLANT
GOWN STRL REUS W/TWL LRG LVL3 (GOWN DISPOSABLE) ×2
HANDLE SUCTION POOLE (INSTRUMENTS) ×1 IMPLANT
KIT BASIN OR (CUSTOM PROCEDURE TRAY) ×1 IMPLANT
KIT TURNOVER KIT B (KITS) ×1 IMPLANT
LIGASURE IMPACT 36 18CM CVD LR (INSTRUMENTS) IMPLANT
NS IRRIG 1000ML POUR BTL (IV SOLUTION) ×2 IMPLANT
PACK GENERAL/GYN (CUSTOM PROCEDURE TRAY) ×1 IMPLANT
PAD ARMBOARD 7.5X6 YLW CONV (MISCELLANEOUS) ×1 IMPLANT
PENCIL SMOKE EVACUATOR (MISCELLANEOUS) ×1 IMPLANT
SLEEVE SUCTION CATH 165 (SLEEVE) ×1 IMPLANT
SPECIMEN JAR LARGE (MISCELLANEOUS) IMPLANT
SPONGE ABD ABTHERA ADVANCE (MISCELLANEOUS) IMPLANT
SPONGE T-LAP 18X18 ~~LOC~~+RFID (SPONGE) IMPLANT
STAPLER PROXIMATE 75MM BLUE (STAPLE) IMPLANT
STAPLER VISISTAT 35W (STAPLE) IMPLANT
SUCTION POOLE HANDLE (INSTRUMENTS) ×1
SUT ETHILON 2 0 FS 18 (SUTURE) IMPLANT
SUT MNCRL AB 4-0 PS2 18 (SUTURE) ×2 IMPLANT
SUT PDS AB 1 TP1 96 (SUTURE) ×2 IMPLANT
SUT SILK 2 0 SH CR/8 (SUTURE) ×1 IMPLANT
SUT SILK 2 0 TIES 10X30 (SUTURE) ×1 IMPLANT
SUT SILK 3 0 SH CR/8 (SUTURE) ×1 IMPLANT
SUT SILK 3 0 TIES 10X30 (SUTURE) ×1 IMPLANT
SUT VIC AB 3-0 SH 18 (SUTURE) IMPLANT
SUT VIC AB 3-0 SH 27 (SUTURE) ×1
SUT VIC AB 3-0 SH 27XBRD (SUTURE) ×2 IMPLANT
TOWEL GREEN STERILE (TOWEL DISPOSABLE) ×1 IMPLANT
TRAY FOLEY MTR SLVR 16FR STAT (SET/KITS/TRAYS/PACK) IMPLANT
YANKAUER SUCT BULB TIP NO VENT (SUCTIONS) IMPLANT

## 2023-01-30 NOTE — Progress Notes (Signed)
1 Day Post-Op  Subjective: On levo at 10 this morning, levophed slowly titrated up overnight. Hgb stable at 13. Good UOP. Total drain output of about 2.5L over last 24 hours. No output from duodenostomy. Creatinine starting to downtrend.   Objective: Vital signs in last 24 hours: Temp:  [95.5 F (35.3 C)-100.4 F (38 C)] 100.4 F (38 C) (04/25 0500) Pulse Rate:  [55-91] 91 (04/25 0500) Resp:  [24] 24 (04/25 0500) BP: (91-134)/(42-80) 91/54 (04/25 0500) SpO2:  [97 %-100 %] 98 % (04/25 0500) Arterial Line BP: (96-121)/(47-70) 98/47 (04/25 0500) FiO2 (%):  [40 %-50 %] 40 % (04/25 0401) Weight:  [93.6 kg] 93.6 kg (04/25 0500) Last BM Date : 01/29/23  Intake/Output from previous day: 04/24 0701 - 04/25 0700 In: 29562 [I.V.:2733.2; ZHYQM:5784; IV Piggyback:613.8] Out: 5347 [Urine:1637; Drains:2485; Stool:225; Blood:1000] Intake/Output this shift: Total I/O In: 1460.1 [I.V.:1346.2; IV Piggyback:113.9] Out: 1785 [Urine:755; Drains:980; Stool:50]  PE: General: NAD Neuro: sedated, opens eyes to verbal stimuli Resp: ETT in place, on vent Vent Mode: PRVC FiO2 (%):  [40 %-50 %] 40 % Set Rate:  [24 bmp] 24 bmp Vt Set:  [630 mL] 630 mL PEEP:  [5 cmH20] 5 cmH20 Plateau Pressure:  [14 cmH20-23 cmH20] 18 cmH20 CV: RRR Abdomen: soft, nondistended. Abthera in place with drainage of thin serosanguinous fluid. LUQ JP serosanguinous. RUQ: T tube with bilious fluid, duodenostomy to gravity with no output. Extremities: warm and well-perfused   Lab Results:  Recent Labs    01/29/23 1533 01/30/23 0417 01/30/23 0425  WBC 6.2 5.4  --   HGB 13.6 13.4 11.9*  HCT 38.5* 38.7* 35.0*  PLT 38* 44*  --    BMET Recent Labs    01/29/23 1533 01/30/23 0417 01/30/23 0425  NA 142 142 144  K 4.0 3.4* 3.3*  CL 105 107  --   CO2 26 27  --   GLUCOSE 62* 93  --   BUN 36* 40*  --   CREATININE 1.52* 1.44*  --   CALCIUM 7.9* 7.3*  --    PT/INR Recent Labs    01/29/23 0702 01/29/23 0956   LABPROT 28.3* 17.8*  INR 2.7* 1.5*   CMP     Component Value Date/Time   NA 144 01/30/2023 0425   K 3.3 (L) 01/30/2023 0425   CL 107 01/30/2023 0417   CO2 27 01/30/2023 0417   GLUCOSE 93 01/30/2023 0417   BUN 40 (H) 01/30/2023 0417   CREATININE 1.44 (H) 01/30/2023 0417   CALCIUM 7.3 (L) 01/30/2023 0417   PROT 3.7 (L) 01/30/2023 0417   ALBUMIN 2.0 (L) 01/30/2023 0417   AST 817 (H) 01/30/2023 0417   ALT 758 (H) 01/30/2023 0417   ALKPHOS 39 01/30/2023 0417   BILITOT 2.8 (H) 01/30/2023 0417   GFRNONAA 54 (L) 01/30/2023 0417   GFRAA >60 09/23/2017 0507   Lipase  No results found for: "LIPASE"     Studies/Results: DG CHEST PORT 1 VIEW  Result Date: 01/29/2023 CLINICAL DATA:  Acute respiratory failure with hypoxia EXAM: PORTABLE CHEST 1 VIEW COMPARISON:  Portable exam 0520 hours compared to 01/28/2023 FINDINGS: Tip of endotracheal tube projects 6.7 cm above carina. Nasogastric tube extends into abdomen. LEFT jugular line tip projects over LEFT brachiocephalic vein confluence. RIGHT jugular line tip projects over SVC. RIGHT arm PICC line tip projects over SVC. Normal heart size, mediastinal contours, and pulmonary vascularity. Infiltrate and questionable small layered effusion RIGHT lung slightly increased. No pneumothorax or acute osseous  findings. IMPRESSION: Probable RIGHT lung infiltrate and questionable small layered RIGHT pleural effusion. Electronically Signed   By: Ulyses Southward M.D.   On: 01/29/2023 07:50   DG CHEST PORT 1 VIEW  Result Date: 01/28/2023 CLINICAL DATA:  Recent surgery. EXAM: PORTABLE CHEST 1 VIEW COMPARISON:  Earlier film, same date. FINDINGS: The endotracheal tube is 7 cm above the carina. The NG tube is coursing down the esophagus and is now in the stomach. The right IJ catheter is stable. The left IJ Cordis is stable. Persistent patchy airspace opacities, atelectasis versus infiltrates. No large pleural effusions or pneumothorax. IMPRESSION: 1. Support apparatus  in good position without complicating features. 2. Persistent patchy airspace opacities, atelectasis versus infiltrates. Electronically Signed   By: Rudie Meyer M.D.   On: 01/28/2023 11:52   DG CHEST PORT 1 VIEW  Result Date: 01/28/2023 CLINICAL DATA:  Postoperative hypoxia, CHF EXAM: PORTABLE CHEST 1 VIEW COMPARISON:  Portable exam 0728 hours compared to 01/27/2023 FINDINGS: Tip of endotracheal tube projects 5.9 cm above carina. Nasogastric tube coiled in distal esophagus; recommend withdrawal and replacement. RIGHT jugular central venous catheter tip projects over SVC. LEFT jugular line tip projects over confluence of LEFT brachiocephalic vein. RIGHT arm PICC line tip projects over SVC. Normal heart size and mediastinal contours. Atelectasis versus consolidation RIGHT upper and RIGHT lower lobes. Minimal retrocardiac LEFT lower lobe opacity. No pleural effusion or pneumothorax. IMPRESSION: Atelectasis versus infiltrate in RIGHT upper and RIGHT lower lobes, minimally LEFT lower lobe. Nasogastric tube coiled in distal esophagus, recommend withdrawal and replacement. Findings called to [facility] in ICU on 01/28/2023 at 0818 hours. Electronically Signed   By: Ulyses Southward M.D.   On: 01/28/2023 08:17       Assessment/Plan 67 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 4/22 - Ex lap, adhesiolysis, duodenojejunal bypass, duodenostomy tube placement, T tube placement in common bile duct. 4/23 - Takeback for bleeding with hemorrhagic shock, resection of ischemic transverse colon, left in discontinuity with Abthera 4/24 - Takeback for bleeding, abdominal washout, ileocecectomy, placement of Abthera  - Likely hypovolemic from drain losses, giving 1L crystalloid bolus this morning with increase in BP.  - Continue TPN at full strength - Elevated LFTs: Likely secondary to shock liver. Elevated bili likely from massive transfusion of RBCs. Continue to trend. - Keep T tube and D tube to gravity drainage. - Tentatively  return to OR this afternoon for washout, possible abdominal closure. Plan to transfuse platelets prior to surgery. - FEN: Strict NPO, TPN - VTE: SCDs, chemical DVT ppx on hold in setting of recent bleeding and coagulopathy. - Dispo: ICU  I discussed the plan of care with the patient's wife at bedside and the patient's nurse.   LOS: 17 days    Sophronia Simas, MD [facility] Surgery General, Hepatobiliary and Pancreatic Surgery 01/30/23 6:54 AM

## 2023-01-30 NOTE — Progress Notes (Signed)
PHARMACY - TOTAL PARENTERAL NUTRITION CONSULT NOTE  Indication: Duodenal obstruction with ileus  Patient Measurements: Height:  (185.4 cm) Weight: 93.6 kg (206 lb 5.6 oz) IBW/kg (Calculated) : 79.9 TPN AdjBW (KG): 65.5 Body mass index is 27.22 kg/m.  Assessment:  41 yoM admitted on 4/8 with N/V, chronic partial SBO, history of multiple previous abdominal surgeries. Pt has very minimal PO intake with ongoing weight loss.  Per documentation, patient has severe PCM.  Pharmacy consulted to manage TPN.   Glucose / Insulin: no hx DM - CBGs well controlled and CBGs/SSI d/c on 4/20. 4/23 severe hyperglycemia post op and started on insulin gtt >> transitioned to SSI and CBGs low normal. No SSI use in the past 24 hrs Electrolytes: K 3.4, others WNL (Mag 2.1 post 2gm, low iCa with MTP, received 1g Ca gluc yesterday) Renal: SCr 1.44, BUN up to 40 Hepatic: LFTs elevated post-op 4/22 and increased further post-op 4/24, tbili up to 2.8 (no jaundice), TG WNL, albumin 2 Intake / Output; MIVF: UOP 0.7 ml/kg/hr, drain , stool , EBL 1L, net +16.9L (actually dry from output) GI Imaging:  -4/4 UGI: Findings suggestive of chronic pSBO or ileus -4/9 CT: Ileus vs high grade partial SBO GI Surgeries / Procedures:  -4/11 EGD: 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 Placed biliary T-tube, duodenostomy tube - 4/22 2nd OR: ex lap with transverse colectomy, abd packed & left open w/ Abthera dressing - 4/24 OR: acute decompensation >> ex-lap with ileocectomy, temporary abd closure with neg pressure dressing.  MTP.  Central access: double lumen PICC placed 01/13/23 TPN start date: 01/14/23  Nutritional Goals: RD Estimated Needs Total Energy Estimated Needs: 2050-2250 Total Protein Estimated Needs: 100-115g Total Fluid Estimated  Needs: 2.1L/day  Current Nutrition:   TPN  Plan:  Continue TPN at goal rate of 85 ml/hr to provide 112g AA, 306g CHO and 2101 kCal per day, meeting 100% of needs Electrolytes in TPN: reduce Na to 151mEq/L, con't K 30 mEq/L with risk for worsening renal fxn, Ca 10 mEq/L, Mg 10 mEq/L, reduce Phos to 15mmol/L on 4/24, Cl:Ac 2:1 Add standard MVI and trace elements to TPN Reduce to sensitive SSI Q4H KCL x 4 runs as ordered Ca gluc 1gm IV x 1 Monitor TPN labs on Mon/Thurs - labs in AM Tentatively returning to OR 4/25 for abdominal closure  Elsia Lasota D. Laney Potash, PharmD, BCPS, BCCCP 01/30/2023, 8:23 AM

## 2023-01-30 NOTE — Anesthesia Preprocedure Evaluation (Addendum)
Anesthesia Evaluation  Patient identified by MRN, date of birth, ID band Patient unresponsive    Reviewed: Allergy & Precautions, Patient's Chart, lab work & pertinent test results  History of Anesthesia Complications (+) Family history of anesthesia reaction and history of anesthetic complications (father slow to waken, mother with PONV)  Airway Mallampati: Intubated      Comment: Previous grade I view with [facility]   Pulmonary neg pulmonary ROS    + decreased breath sounds  (-) rales    Cardiovascular +CHF  negative cardio ROS + dysrhythmias (1st degree AV block)  Rhythm:Regular Rate:Tachycardia  Echocardiogram 12/11/2018 : Left ventricle cavity is normal in size. Normal global wall motion. Normal diastolic filling pattern. Calculated EF 55%. Trileaflet aortic valve with trace regurgitation. Trace to mild Mild (Grade I) mitral regurgitation. Trace tricuspid regurgitation. Unable to estimate PA pressure due to absence/minimal TR signal. Trace pulmonic regurgitation. Essentially normal echocardiogram.  Normal stress test 12/02/2018     Neuro/Psych negative neurological ROS     GI/Hepatic negative GI ROS, Neg liver ROS,,,  Endo/Other  negative endocrine ROS    Renal/GU negative Renal ROS     Musculoskeletal negative musculoskeletal ROS (+) Arthritis ,    Abdominal   Peds  Hematology negative hematology ROS (+)   Anesthesia Other Findings [phone] with massive GI bleeding secondary to small GJ ulceration with a pseudoaneurysm of the jejunal arcade  Duodenal obstruction and UGIB - S/P: 4/22 - Ex lap, adhesiolysis, duodenojejunal bypass, duodenostomy tube placement, T tube placement in common bile duct. 4/23 - Takeback for bleeding with hemorrhagic shock, resection of ischemic transverse colon, left in discontinuity with Abthera 4/24 - Takeback for bleeding, abdominal washout, ileocecectomy, placement of  Abthera Return to OR today with Dr. Freida Busman for washout and possible closure Massive transfusion - rec'd 9/4/1/1 on 4/22, 1/0/3/1 on 4/22, MTP initiated again yesterday and went back to OR. Today Hb 13.4, PLTs 44k and will get PLTs now as going to OR Shock - hemorrhagic etiology, better with surgery and resuscitation, albumin bolus, wean levo as able (at 10 now) AKI - albumin bolus now, UTI as below, Trend creatinine and UOP. VDRF - full support, now on 40% and PEEP 5, ABG good   Reproductive/Obstetrics                             Anesthesia Physical Anesthesia Plan  ASA: 4  Anesthesia Plan: General   Post-op Pain Management: Minimal or no pain anticipated   Induction: Intravenous  PONV Risk Score and Plan: 2 and Treatment may vary due to age or medical condition  Airway Management Planned: Oral ETT  Additional Equipment: Arterial line  Intra-op Plan:   Post-operative Plan: Post-operative intubation/ventilation  Informed Consent:      History available from chart only  Plan Discussed with: Anesthesiologist and CRNA  Anesthesia Plan Comments:         Anesthesia Quick Evaluation

## 2023-01-30 NOTE — Transfer of Care (Signed)
Immediate Anesthesia Transfer of Care Note  Patient: Bradley Dominguez  Procedure(s) Performed: REOPENING OF LAPAROTOMY, POSSIBLE OSTOMY  Patient Location: ICU  Anesthesia Type:General  Level of Consciousness: sedated and Patient remains intubated per anesthesia plan  Airway & Oxygen Therapy: Patient remains intubated per anesthesia plan and Patient placed on Ventilator (see vital sign flow sheet for setting)  Post-op Assessment: Report given to RN and Post -op Vital signs reviewed and stable  Post vital signs: Reviewed and stable  Last Vitals:  Vitals Value Taken Time  BP    Temp    Pulse    Resp    SpO2      Last Pain:  Vitals:   01/30/23 0810  TempSrc: Esophageal  PainSc:          Complications: No notable events documented.

## 2023-01-30 NOTE — Progress Notes (Signed)
Nutrition Follow-up  DOCUMENTATION CODES:   Severe malnutrition in context of chronic illness  INTERVENTION:   TPN to meet increased nutrition needs Increased protein needs with surgery and VAC output - discussed with Pharmacy for adjustments   NUTRITION DIAGNOSIS:   Severe Malnutrition related to chronic illness as evidenced by moderate fat depletion, severe muscle depletion, energy intake < or equal to 75% for > or equal to 1 month, percent weight loss. Ongoing.   GOAL:   Patient will meet greater than or equal to 90% of their needs Met with TPN at goal   MONITOR:   Diet advancement, Labs, Weight trends, I & O's (TPN)  REASON FOR ASSESSMENT:   Consult New TPN/TNA  ASSESSMENT:   67 yo male who was admitted from home with worsening nausea, vomiting, bloating and weakness. Patient with remote history of multiple prior laparotomies (in the 1970's), with a chronic partial small bowel obstruction.  Pt discussed during ICU rounds and with RN.  Plan today to return to the OR for washout and possible closure   04/08: admitted, PICC line placed 04/09: TPN initiated 04/13: increased to goal TPN of 18mL/hr 04/14: emergent EGD; oozing gastric ulcer s/p epi and clips 04/15: IR for emergent embolization, Intubated 04/22: s/p ex lap, LOA, duodenojejunal bypass with biliary T-tube, duodenostomy tube 04/23: s/p second OR for ex lap with transverse colectomy, abd packed and open abd, left in discontinuity due to colonic ischemia, ligation of bleeding mesenteric vessels,  04/24: OR due to acute decompensation s/p ex lap with ileocectomy, temp abd closure, MTP (did not receive TPN due to contaminated port)   Medications reviewed and include: SSI, protonix  Calcium gluconate Precedex Fentanyl  Levophed @ 10 mcg Zosyn KCl x 4  Labs reviewed: K 3.3    TPN via PICC @ 85 ml/hr  Provides: 2101 kcal and 112 g AA Standard MVI and TE  UOP: 1672 ml  NG: 0 ml T tube: 180 ml  L JP:  295 ml RUQ: 70 ml  VAC: 2050 ml <--- 4195 ml <---  1000 ml  +16.7 L   Diet Order:   Diet Order             Diet NPO time specified  Diet effective now                   EDUCATION NEEDS:   Education needs have been addressed  Skin:  Skin Assessment: Reviewed RN Assessment  Last BM:  4/15  Height:   Ht Readings from Last 1 Encounters:  01/27/23  (1.854 m)    Weight:   Wt Readings from Last 1 Encounters:  01/30/23 93.6 kg    BMI:  Body mass index is 27.22 kg/m.  Estimated Nutritional Needs:   Kcal:  2050-2250  Protein:  130-145 grams  Fluid:  2.1L/day  Rector Devonshire P., RD, LDN, CNSC See AMiON for contact information

## 2023-01-30 NOTE — Op Note (Signed)
Date: 01/30/23  Patient: Bradley Dominguez MRN: [id number]  Preoperative Diagnosis: Open abdomen with bowel in discontinuity Postoperative Diagnosis: Small bowel ischemia  Procedure: Exploratory laparotomy with intraabdominal washout Small bowel resection (distal ileum) Temporary abdominal closure with negative pressure dressing  Surgeon: Sophronia Simas, MD Assistant: Violeta Gelinas, MD  EBL: Minimal  Anesthesia: General endotracheal  Specimens: Small bowel (distal ileum)  Indications: Bradley Dominguez is a 67 yo male who underwent exploratory laparotomy, adhesiolysis, and duodenojejunal bypass three days ago for a duodenal obstruction. His course has been complicated with bleeding with hemorrhagic shock, as well as colonic ischemia, for which he required takeback to the OR with abdominal packing and bowel resection, most recently yesterday morning. His abdomen remained open with the bowel in discontinuity, and he has remained overall stable with only low-dose pressor requirement for the last 24 hours, and no further signs of bleeding. His acidosis has been corrected. He was brought back to the OR today for re-exploration and possible abdominal closure.  Findings: Nine previously placed laparotomy pads were removed from the abdomen (5 from the right side, 4 from the left side). Necrosis of the distal 10cm of ileum, which was resected. This included the patient's previous distal small bowel anastomosis (done at a prior surgery in the 1970s). The small bowel was otherwise pink and well-perfused. The duodenostomy tube was upsized to a 24-Fr malecot.  Procedure details: Informed consent was obtained from the patient's family prior to surgery. The patient was transferred from the ICU to the operating room and placed on the table in the supine position. General anesthesia was induced. The Abthera was removed and the abdomen was prepped and draped in the usual sterile fashion. A pre-procedure timeout was  taken verifying patient identity, surgical site and procedure to be performed.  On examining the bowel, the distal 10cm of ileum was frankly necrotic, with a sharp demarcation between healthy and necrotic bowel. The remainder of the bowel was run and was healthy and well-perfused. The previously placed lap pads were all removed. A mesenteric window was made on the distal ileum just proximal to the necrotic area. The bowel was transected with a 75mm GIA stapler with a blue load. The mesentery was divided with Ligasure. The specimen was passed off the field and sent for routine pathology. The resected specimen included the previous small bowel anastomosis from previous surgeries many years ago. The RUQ was examined. There was leakage of succus around the duodenostomy tube, which appeared intraluminal but the closure around the tube had opened, and the tube did not appear secure. The duodenal tissue was very friable. The decision was made to upsize to a larger tube. The 22-Fr malecot was removed. A new 24-Fr malecot was brought onto the field and passed through the abdominal wall at the same site as the previous tube. The end of the new 24-Fr tube was placed through the duodenal defect on D1. The adjacent jejunum was pulled over the tube exit site from the small bowel and secured to healthy pyloric channel tissue using a single 3-0 silk suture. The JP drain was appropriately positioned along the duodenum next to the exit site of the malecot. The small bowel was placed back into the abdomen, taking care not to twist the mesentery.  The patient tolerated the procedure well with no apparent complications. All counts were correct x2 at the end of the procedure. The patient was extubated and taken to PACU in stable condition.  Sophronia Simas, MD 01/30/23 3:57 PM

## 2023-01-30 NOTE — Progress Notes (Signed)
Patient ID: Bradley Dominguez, male   DOB: 05-11-1956, 67 y.o.   MRN: [id number] Follow up - Trauma Critical Care   Patient Details:    Bradley Dominguez is an 67 y.o. male.  Lines/tubes : Airway 7.5 mm (Active)  Secured at (cm) 24 cm 01/30/23 0749  Measured From Lips 01/30/23 0749  Secured Location Center 01/30/23 0749  Secured By [employer] 01/30/23 0749  Tube Holder Repositioned Yes 01/30/23 0749  Prone position No 01/30/23 0749  Cuff Pressure (cm H2O) Green OR 18-26 [facility] 01/30/23 0749  Site Condition Dry 01/30/23 0749     PICC Triple Lumen 01/23/23 Right Basilic 39 cm 0 cm (Active)  Indication for Insertion or Continuance of Line Vasoactive infusions;Administration of hyperosmolar/irritating solutions (i.e. TPN, Vancomycin, etc.);Prolonged intravenous therapies 01/29/23 1100  Exposed Catheter (cm) 0 cm 01/23/23 1840  Site Assessment Clean, Dry, Intact 01/29/23 2000  Lumen #1 Status Infusing 01/29/23 2000  Lumen #2 Status Infusing 01/29/23 2000  Lumen #3 Status Infusing 01/29/23 2000  Dressing Type Transparent 01/29/23 2000  Dressing Status Antimicrobial disc in place 01/29/23 2000  Safety Lock Intact 01/24/23 0800  Line Care Lumen 3 cap changed;Lumen 3 tubing changed 01/29/23 1729  Line Adjustment (NICU/IV Team Only) No 01/24/23 2000  Dressing Intervention New dressing;Other (Comment) 01/23/23 1840  Dressing Change Due 01/30/23 01/29/23 2000     CVC Single Lumen 01/27/23 Left Internal jugular (Active)  Indication for Insertion or Continuance of Line Vasoactive infusions;Administration of hyperosmolar/irritating solutions (i.e. TPN, Vancomycin, etc.);Prolonged intravenous therapies 01/29/23 1100  Site Assessment Clean, Dry, Intact 01/29/23 2000  Line Status Infusing 01/29/23 2000  Dressing Type Transparent 01/29/23 2000  Dressing Status Antimicrobial disc in place 01/29/23 2000  Line Care Connections checked and tightened 01/29/23 1100  Dressing Intervention New  dressing 01/28/23 0800  Dressing Change Due 02/04/23 01/29/23 2000     CVC Triple Lumen 01/28/23 Right Internal jugular (Active)  Indication for Insertion or Continuance of Line Vasoactive infusions;Administration of hyperosmolar/irritating solutions (i.e. TPN, Vancomycin, etc.);Prolonged intravenous therapies 01/29/23 1100  Site Assessment Clean, Dry, Intact 01/29/23 2000  Proximal Lumen Status Infusing 01/29/23 2000  Medial Lumen Status Infusing 01/29/23 2000  Distal Lumen Status Infusing 01/29/23 2000  Dressing Type Transparent 01/29/23 2000  Dressing Status Antimicrobial disc in place 01/29/23 2000  Line Care Connections checked and tightened 01/29/23 1100  Dressing Intervention New dressing;Dressing changed;Antimicrobial disc changed 01/29/23 1100  Dressing Change Due 02/05/23 01/29/23 2000     Arterial Line 01/27/23 Right Radial (Active)  Site Assessment Clean, Dry, Intact 01/29/23 2000  Line Status Pulsatile blood flow 01/29/23 2000  Art Line Waveform Square wave test performed;Dampened 01/29/23 1100  Art Line Interventions Zeroed and calibrated;Connections checked and tightened;Flushed per protocol 01/29/23 1100  Color/Movement/Sensation Capillary refill less than 3 sec 01/29/23 1100  Dressing Type Transparent 01/29/23 2000  Dressing Status Clean, Dry, Intact 01/29/23 2000  Dressing Change Due 02/03/23 01/29/23 2000     Closed System Drain 1 Left Abdomen Bulb (JP) 19 Fr. (Active)  Site Description Unremarkable 01/29/23 2000  Dressing Status None 01/29/23 2000  Drainage Appearance Bloody;Dark red 01/29/23 2000  Status To suction (Charged) 01/29/23 2000  Output (mL) 25 mL 01/30/23 0700     Negative Pressure Wound Therapy Abdomen Anterior;Mid (Active)  Last dressing change 01/29/23 01/29/23 0749  Site / Wound Assessment Dressing in place / Unable to assess 01/29/23 2000  Peri-wound Assessment Intact 01/29/23 2000  Wound filler - Nonadherent 1 01/29/23 0749  Cycle Continuous  01/29/23 2000  Target Pressure (mmHg) 125 01/29/23 2000  Canister Changed Yes 01/29/23 2200  Machine plugged into wall outlet (NOT bed outlet) Yes 01/29/23 1100  Dressing Status Intact 01/29/23 2000  Drainage Amount Moderate 01/29/23 0830  Drainage Description Sanguineous 01/29/23 0830  Output (mL) 75 mL 01/30/23 0700     Biliary Tube T-tube 10 Fr. RUQ (Active)  Site Description Unremarkable 01/29/23 2000  Dressing Status None 01/29/23 2000  Drainage Color Brown 01/29/23 2000  Tube Status/Interventions Open to gravity drainage 01/29/23 1100  Output (mL) 10 mL 01/30/23 0700     NG/OG Vented/Dual Lumen Right nare (Active)  Tube Position (Required) External length of tube 01/29/23 2000  Measurement (cm) (Required) 73 cm 01/29/23 0830  Ongoing Placement Verification (Required) (See row information) Yes 01/29/23 1100  Site Assessment Clean, Dry, Intact 01/29/23 2000  Interventions Retaped 01/29/23 0830  Status Low continuous suction 01/29/23 2000  Amount of suction 70 mmHg 01/29/23 2000  Drainage Appearance Dark red 01/29/23 2000  Intake (mL) 50 mL 01/24/23 1800  Output (mL) 0 mL 01/29/23 1800     Gastrostomy/Enterostomy Other (Comment) RUQ (Active)  Surrounding Skin Dry;Intact 01/29/23 2000  Tube Status/Interventions Open to gravity drainage 01/29/23 2000  Drainage Appearance Dark red 01/29/23 2000  Dressing Status None 01/29/23 1100  Output (mL) 0 mL 01/30/23 0600     Urethral Catheter Octavia Bruckner RN Latex 16 Fr. (Active)  Site Assessment Clean, Dry, Intact 01/30/23 0000  Catheter Maintenance Bag below level of bladder;Catheter secured;Drainage bag/tubing not touching floor;Insertion date on drainage bag;No dependent loops;Seal intact 01/30/23 0000  Collection Container Standard drainage bag 01/30/23 0000  Securement Method Securing device (Describe) 01/30/23 0000  Urinary Catheter Interventions (if applicable) Unclamped 01/30/23 0000  Output (mL) 35 mL 01/30/23 0700     Fecal  Management System (Active)  Does patient meet criteria for removal? No 01/29/23 0830  Daily care Skin around tube assessed;Skin barrier applied to rectal area;Assess location of position indicator line;Flushed tube with 30mL water (document as intake) 01/29/23 0830  Patient Indicator Assessment Green 01/29/23 2000  Bulb Deflated and Reinflated Yes 01/29/23 2000  Output (mL) 50 mL 01/30/23 0600  Intake (mL) 30 mL 01/29/23 0830    Microbiology/Sepsis markers: Results for orders placed or performed during the hospital encounter of 01/13/23  MRSA Next Gen by PCR, Nasal     Status: None   Collection Time: 01/28/23 12:12 PM   Specimen: Nasal Mucosa; Nasal Swab  Result Value Ref Range Status   MRSA by PCR Next Gen NOT DETECTED NOT DETECTED Final    Comment: (NOTE) The GeneXpert MRSA Assay (FDA approved for NASAL specimens only), is one component of a comprehensive MRSA colonization surveillance program. It is not intended to diagnose MRSA infection nor to guide or monitor treatment for MRSA infections. Test performance is not FDA approved in patients less than 83 years old. Performed at [facility], [address], [place], [place] [address]     Anti-infectives:  Anti-infectives (From admission, onward)    Start     Dose/Rate Route Frequency Ordered Stop   01/28/23 1000  piperacillin-tazobactam (ZOSYN) IVPB 3.375 g        3.375 g 12.5 mL/hr over 240 Minutes Intravenous Every 8 hours 01/28/23 0902     01/27/23 1030  ceFAZolin (ANCEF) IVPB 2g/100 mL premix       See Hyperspace for full Linked Orders Report.   2 g 200 mL/hr over 30 Minutes Intravenous On call to O.R. 01/27/23 6045  01/27/23 1135   01/27/23 1030  metroNIDAZOLE (FLAGYL) IVPB 500 mg       See Hyperspace for full Linked Orders Report.   500 mg 100 mL/hr over 60 Minutes Intravenous On call to O.R. 01/27/23 0746 01/27/23 1141   01/19/23 0600  cefTRIAXone (ROCEPHIN) 2 g in sodium chloride 0.9 % 100 mL IVPB       See  Hyperspace for full Linked Orders Report.   2 g 200 mL/hr over 30 Minutes Intravenous On call to O.R. 01/19/23 0350 01/19/23 0606   01/19/23 0350  metroNIDAZOLE (FLAGYL) IVPB 500 mg  Status:  Discontinued       See Hyperspace for full Linked Orders Report.   500 mg 100 mL/hr over 60 Minutes Intravenous Every 8 hours 01/19/23 0350 01/19/23 0752       Subjective:    Overnight Issues: more stable, no blood from drains  Objective:  Vital signs for last 24 hours: Temp:  [95.5 F (35.3 C)-100.6 F (38.1 C)] 100.4 F (38 C) (04/25 0700) Pulse Rate:  [55-91] 89 (04/25 0600) Resp:  [24] 24 (04/25 0700) BP: (86-134)/(42-80) 104/47 (04/25 0749) SpO2:  [97 %-100 %] 97 % (04/25 0600) Arterial Line BP: (92-121)/(45-70) 97/46 (04/25 0700) FiO2 (%):  [40 %-50 %] 40 % (04/25 0749) Weight:  [93.6 kg] 93.6 kg (04/25 0500)  Hemodynamic parameters for last 24 hours:    Intake/Output from previous day: 04/24 0701 - 04/25 0700 In: [address] [I.V.:2733.2; UEAVW:0981; IV Piggyback:613.8] Out: 5492 [Urine:1672; Drains:2595; Stool:225; Blood:1000]  Intake/Output this shift: No intake/output data recorded.  Vent settings for last 24 hours: Vent Mode: PRVC FiO2 (%):  [40 %-50 %] 40 % Set Rate:  [24 bmp] 24 bmp Vt Set:  [630 mL] 630 mL PEEP:  [5 cmH20] 5 cmH20 Plateau Pressure:  [14 cmH20-23 cmH20] 17 cmH20  Physical Exam:  General: on vent Neuro: arouses, opens eyes and F/C HEENT/Neck: ETT Resp: clear to auscultation bilaterally CVS: RRR GI: open abd vac, D tube and T tube bilious, touch of blood in D tube Extremities: mild edema  Results for orders placed or performed during the hospital encounter of 01/13/23 (from the past 24 hour(s))  CBC     Status: Abnormal   Collection Time: 01/29/23  9:03 AM  Result Value Ref Range   WBC 4.4 4.0 - 10.5 K/uL   RBC 4.26 4.22 - 5.81 MIL/uL   Hemoglobin 12.4 (L) 13.0 - 17.0 g/dL   HCT 19.1 (L) 47.8 - 29.5 %   MCV 86.2 80.0 - 100.0 fL   MCH 29.1  26.0 - 34.0 pg   MCHC 33.8 30.0 - 36.0 g/dL   RDW 62.1 (H) 30.8 - 65.7 %   Platelets 42 (L) 150 - 400 K/uL   nRBC 0.5 (H) 0.0 - 0.2 %  Basic metabolic panel     Status: Abnormal   Collection Time: 01/29/23  9:03 AM  Result Value Ref Range   Sodium 139 135 - 145 mmol/L   Potassium 3.9 3.5 - 5.1 mmol/L   Chloride 100 98 - 111 mmol/L   CO2 23 22 - 32 mmol/L   Glucose, Bld 123 (H) 70 - 99 mg/dL   BUN 32 (H) 8 - 23 mg/dL   Creatinine, Ser 8.46 (H) 0.61 - 1.24 mg/dL   Calcium 7.8 (L) 8.9 - 10.3 mg/dL   GFR, Estimated >96 >29 mL/min   Anion gap 16 (H) 5 - 15  Magnesium     Status: None  Collection Time: 01/29/23  9:03 AM  Result Value Ref Range   Magnesium 1.8 1.7 - 2.4 mg/dL  Phosphorus     Status: Abnormal   Collection Time: 01/29/23  9:03 AM  Result Value Ref Range   Phosphorus 7.4 (H) 2.5 - 4.6 mg/dL  Trauma TEG Panel     Status: Abnormal   Collection Time: 01/29/23  9:03 AM  Result Value Ref Range   Citrated Kaolin (R) 7.2 4.6 - 9.1 min   Citrated Rapid TEG (MA) 43.9 (L) 52 - 70 mm   CFF Max Amplitude 14.8 (L) 15 - 32 mm   Lysis at 30 Minutes 0 0.0 - 2.6 %  I-STAT 7, (LYTES, BLD GAS, ICA, H+H)     Status: Abnormal   Collection Time: 01/29/23  9:06 AM  Result Value Ref Range   pH, Arterial 7.364 7.35 - 7.45   pCO2 arterial 41.8 32 - 48 mmHg   pO2, Arterial 132 (H) 83 - 108 mmHg   Bicarbonate 24.3 20.0 - 28.0 mmol/L   TCO2 26 22 - 32 mmol/L   O2 Saturation 99 %   Acid-base deficit 2.0 0.0 - 2.0 mmol/L   Sodium 142 135 - 145 mmol/L   Potassium 3.9 3.5 - 5.1 mmol/L   Calcium, Ion 1.07 (L) 1.15 - 1.40 mmol/L   HCT 34.0 (L) 39.0 - 52.0 %   Hemoglobin 11.6 (L) 13.0 - 17.0 g/dL   Patient temperature 54.0 C    Collection site art line    Drawn by RT    Sample type ARTERIAL   DIC Panel     Status: Abnormal   Collection Time: 01/29/23  9:56 AM  Result Value Ref Range   Prothrombin Time 17.8 (H) 11.4 - 15.2 seconds   INR 1.5 (H) 0.8 - 1.2   aPTT 37 (H) 24 - 36 seconds    Fibrinogen 201 (L) 210 - 475 mg/dL   D-Dimer, Quant 9.81 (H) 0.00 - 0.50 ug/mL-FEU   Platelets 42 (L) 150 - 400 K/uL   Smear Review NO SCHISTOCYTES SEEN   Glucose, capillary     Status: Abnormal   Collection Time: 01/29/23 10:04 AM  Result Value Ref Range   Glucose-Capillary 108 (H) 70 - 99 mg/dL  Urinalysis, Routine w reflex microscopic -Urine, Catheterized     Status: Abnormal   Collection Time: 01/29/23 10:39 AM  Result Value Ref Range   Color, Urine AMBER (A) YELLOW   APPearance CLOUDY (A) CLEAR   Specific Gravity, Urine 1.019 1.005 - 1.030   pH 5.0 5.0 - 8.0   Glucose, UA NEGATIVE NEGATIVE mg/dL   Hgb urine dipstick LARGE (A) NEGATIVE   Bilirubin Urine NEGATIVE NEGATIVE   Ketones, ur NEGATIVE NEGATIVE mg/dL   Protein, ur 30 (A) NEGATIVE mg/dL   Nitrite NEGATIVE NEGATIVE   Leukocytes,Ua NEGATIVE NEGATIVE   RBC / HPF >50 0 - 5 RBC/hpf   WBC, UA 6-10 0 - 5 WBC/hpf   Bacteria, UA MANY (A) NONE SEEN   Squamous Epithelial / HPF 0-5 0 - 5 /HPF   Mucus PRESENT    Hyaline Casts, UA PRESENT    Granular Casts, UA PRESENT   Glucose, capillary     Status: None   Collection Time: 01/29/23 12:20 PM  Result Value Ref Range   Glucose-Capillary 75 70 - 99 mg/dL  Prepare platelet pheresis     Status: None (Preliminary result)   Collection Time: 01/29/23  3:20 PM  Result Value Ref Range  Unit Number [id number]    Blood Component Type PLTP1 PSORALEN TREATED    Unit division 00    Status of Unit ISSUED    Transfusion Status OK TO TRANSFUSE   CBC     Status: Abnormal   Collection Time: 01/29/23  3:33 PM  Result Value Ref Range   WBC 6.2 4.0 - 10.5 K/uL   RBC 4.67 4.22 - 5.81 MIL/uL   Hemoglobin 13.6 13.0 - 17.0 g/dL   HCT 13.2 (L) 44.0 - 10.2 %   MCV 82.4 80.0 - 100.0 fL   MCH 29.1 26.0 - 34.0 pg   MCHC 35.3 30.0 - 36.0 g/dL   RDW 72.5 (H) 36.6 - 44.0 %   Platelets 38 (L) 150 - 400 K/uL   nRBC 0.0 0.0 - 0.2 %  Basic metabolic panel     Status: Abnormal   Collection Time:  01/29/23  3:33 PM  Result Value Ref Range   Sodium 142 135 - 145 mmol/L   Potassium 4.0 3.5 - 5.1 mmol/L   Chloride 105 98 - 111 mmol/L   CO2 26 22 - 32 mmol/L   Glucose, Bld 62 (L) 70 - 99 mg/dL   BUN 36 (H) 8 - 23 mg/dL   Creatinine, Ser 3.47 (H) 0.61 - 1.24 mg/dL   Calcium 7.9 (L) 8.9 - 10.3 mg/dL   GFR, Estimated 50 (L) >60 mL/min   Anion gap 11 5 - 15  Magnesium     Status: None   Collection Time: 01/29/23  3:33 PM  Result Value Ref Range   Magnesium 2.4 1.7 - 2.4 mg/dL  Phosphorus     Status: Abnormal   Collection Time: 01/29/23  3:33 PM  Result Value Ref Range   Phosphorus 5.9 (H) 2.5 - 4.6 mg/dL  Glucose, capillary     Status: Abnormal   Collection Time: 01/29/23  3:49 PM  Result Value Ref Range   Glucose-Capillary 59 (L) 70 - 99 mg/dL  Glucose, capillary     Status: None   Collection Time: 01/29/23  4:32 PM  Result Value Ref Range   Glucose-Capillary 79 70 - 99 mg/dL  Prepare platelet pheresis     Status: None (Preliminary result)   Collection Time: 01/29/23  5:30 PM  Result Value Ref Range   Unit Number [id number]    Blood Component Type PLTP2 PSORALEN TREATED    Unit division 00    Status of Unit ISSUED    Transfusion Status      OK TO TRANSFUSE Performed at [facility], [address], [place], [place] [address]   Glucose, capillary     Status: None   Collection Time: 01/29/23  7:40 PM  Result Value Ref Range   Glucose-Capillary 98 70 - 99 mg/dL  Glucose, capillary     Status: None   Collection Time: 01/29/23 11:26 PM  Result Value Ref Range   Glucose-Capillary 88 70 - 99 mg/dL  Glucose, capillary     Status: None   Collection Time: 01/30/23  3:41 AM  Result Value Ref Range   Glucose-Capillary 73 70 - 99 mg/dL  Comprehensive metabolic panel     Status: Abnormal   Collection Time: 01/30/23  4:17 AM  Result Value Ref Range   Sodium 142 135 - 145 mmol/L   Potassium 3.4 (L) 3.5 - 5.1 mmol/L   Chloride 107 98 - 111 mmol/L   CO2 27 22 - 32  mmol/L   Glucose, Bld 93 70 -  99 mg/dL   BUN 40 (H) 8 - 23 mg/dL   Creatinine, Ser 0.86 (H) 0.61 - 1.24 mg/dL   Calcium 7.3 (L) 8.9 - 10.3 mg/dL   Total Protein 3.7 (L) 6.5 - 8.1 g/dL   Albumin 2.0 (L) 3.5 - 5.0 g/dL   AST 578 (H) 15 - 41 U/L   ALT 758 (H) 0 - 44 U/L   Alkaline Phosphatase 39 38 - 126 U/L   Total Bilirubin 2.8 (H) 0.3 - 1.2 mg/dL   GFR, Estimated 54 (L) >60 mL/min   Anion gap 8 5 - 15  Magnesium     Status: None   Collection Time: 01/30/23  4:17 AM  Result Value Ref Range   Magnesium 2.1 1.7 - 2.4 mg/dL  Phosphorus     Status: None   Collection Time: 01/30/23  4:17 AM  Result Value Ref Range   Phosphorus 3.9 2.5 - 4.6 mg/dL  Triglycerides     Status: None   Collection Time: 01/30/23  4:17 AM  Result Value Ref Range   Triglycerides 61 <150 mg/dL  CBC     Status: Abnormal   Collection Time: 01/30/23  4:17 AM  Result Value Ref Range   WBC 5.4 4.0 - 10.5 K/uL   RBC 4.63 4.22 - 5.81 MIL/uL   Hemoglobin 13.4 13.0 - 17.0 g/dL   HCT 46.9 (L) 62.9 - 52.8 %   MCV 83.6 80.0 - 100.0 fL   MCH 28.9 26.0 - 34.0 pg   MCHC 34.6 30.0 - 36.0 g/dL   RDW 41.3 (H) 24.4 - 01.0 %   Platelets 44 (L) 150 - 400 K/uL   nRBC 0.0 0.0 - 0.2 %  Trauma TEG Panel     Status: Abnormal   Collection Time: 01/30/23  4:17 AM  Result Value Ref Range   Citrated Kaolin (R) 7.0 4.6 - 9.1 min   Citrated Rapid TEG (MA) 49.3 (L) 52 - 70 mm   CFF Max Amplitude 16.6 15 - 32 mm   Lysis at 30 Minutes 0 0.0 - 2.6 %  I-STAT 7, (LYTES, BLD GAS, ICA, H+H)     Status: Abnormal   Collection Time: 01/30/23  4:25 AM  Result Value Ref Range   pH, Arterial 7.417 7.35 - 7.45   pCO2 arterial 42.2 32 - 48 mmHg   pO2, Arterial 90 83 - 108 mmHg   Bicarbonate 26.9 20.0 - 28.0 mmol/L   TCO2 28 22 - 32 mmol/L   O2 Saturation 97 %   Acid-Base Excess 2.0 0.0 - 2.0 mmol/L   Sodium 144 135 - 145 mmol/L   Potassium 3.3 (L) 3.5 - 5.1 mmol/L   Calcium, Ion 1.13 (L) 1.15 - 1.40 mmol/L   HCT 35.0 (L) 39.0 - 52.0 %    Hemoglobin 11.9 (L) 13.0 - 17.0 g/dL   Patient temperature 27.2 C    Collection site RADIAL, ALLEN'S TEST ACCEPTABLE    Drawn by RT    Sample type ARTERIAL   Prepare platelet pheresis     Status: None (Preliminary result)   Collection Time: 01/30/23  6:00 AM  Result Value Ref Range   Unit Number [id number]    Blood Component Type PLTP1 PSORALEN TREATED    Unit division 00    Status of Unit ISSUED    Transfusion Status      OK TO TRANSFUSE Performed at [facility], [address] [place], [place] [address]   Glucose, capillary  Status: None   Collection Time: 01/30/23  7:21 AM  Result Value Ref Range   Glucose-Capillary 81 70 - 99 mg/dL    Assessment & Plan: Present on Admission:  Bowel obstruction    LOS: 17 days   Additional comments:I reviewed the patient's new clinical lab test results. 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 4/23 - Takeback for bleeding with hemorrhagic shock, resection of ischemic transverse colon, left in discontinuity with Abthera 4/24 - Takeback for bleeding, abdominal washout, ileocecectomy, placement of Abthera Return to OR today with Dr. Freida Busman for washout and possible closure Massive transfusion - rec'd 9/4/1/1 on 4/22, 1/0/3/1 on 4/22, MTP initiated again yesterday and went back to OR. Today Hb 13.4, PLTs 44k and will get PLTs now as going to OR Shock - hemorrhagic etiology, better with surgery and resuscitation, albumin bolus, wean levo as able (at 10 now) AKI - albumin bolus now, UTI as below, Trend creatinine and UOP. VDRF - full support, now on 40% and PEEP 5, ABG good FEN - TPN, replete hypokalemia DVT - SCDs, hold chemical ppx in light of active bleeding Foley - to remain  ID - UTI with CX pending,  empiric zosyn for bowel ischemia, started 4/23 Dispo - ICU, OR   Clinical update provided to wife at the bedside Critical Care Total Time*: 38 Minutes  Violeta Gelinas, MD, MPH, FACS Trauma & General Surgery Use [email or web address] to contact on call provider  01/30/2023  *Care during the described time interval was provided by me. I have reviewed this patient's available data, including medical history, events of note, physical examination and test results as part of my evaluation.

## 2023-01-31 ENCOUNTER — Encounter (HOSPITAL_COMMUNITY): Payer: Self-pay | Admitting: Surgery

## 2023-01-31 LAB — COMPREHENSIVE METABOLIC PANEL
ALT: 920 U/L — ABNORMAL HIGH (ref 0–44)
AST: 801 U/L — ABNORMAL HIGH (ref 15–41)
Albumin: 2.1 g/dL — ABNORMAL LOW (ref 3.5–5.0)
Alkaline Phosphatase: 45 U/L (ref 38–126)
Anion gap: 13 (ref 5–15)
BUN: 57 mg/dL — ABNORMAL HIGH (ref 8–23)
CO2: 23 mmol/L (ref 22–32)
Calcium: 7.5 mg/dL — ABNORMAL LOW (ref 8.9–10.3)
Chloride: 109 mmol/L (ref 98–111)
Creatinine, Ser: 1.55 mg/dL — ABNORMAL HIGH (ref 0.61–1.24)
GFR, Estimated: 49 mL/min — ABNORMAL LOW (ref >60)
Glucose, Bld: 122 mg/dL — ABNORMAL HIGH (ref 70–99)
Potassium: 4 mmol/L (ref 3.5–5.1)
Sodium: 145 mmol/L (ref 135–145)
Total Bilirubin: 4.7 mg/dL — ABNORMAL HIGH (ref 0.3–1.2)
Total Protein: 3.9 g/dL — ABNORMAL LOW (ref 6.5–8.1)

## 2023-01-31 LAB — BPAM FFP
Blood Product Expiration Date: 202404272359
Blood Product Expiration Date: 202404272359
Blood Product Expiration Date: 202404272359
Blood Product Expiration Date: 202404282359
Blood Product Expiration Date: 202404282359
Blood Product Expiration Date: 202404292359
Blood Product Expiration Date: 202404292359
Blood Product Expiration Date: 202404292359
Blood Product Expiration Date: 202404292359
Blood Product Expiration Date: 202404292359
Blood Product Expiration Date: 202404292359
Blood Product Expiration Date: 202404292359
Blood Product Expiration Date: 202404292359
Blood Product Expiration Date: 202404292359
Blood Product Expiration Date: 202404292359
Blood Product Expiration Date: 202404292359
Blood Product Expiration Date: 202405082359
Blood Product Expiration Date: 202405082359
Blood Product Expiration Date: 202404292359
ISSUE DATE / TIME: 202404240643
ISSUE DATE / TIME: 202404240643
ISSUE DATE / TIME: 202404240711
ISSUE DATE / TIME: 202404240721
ISSUE DATE / TIME: 202404240721
ISSUE DATE / TIME: 202404240744
Unit Type and Rh: 5100
Unit Type and Rh: 5100
Unit Type and Rh: 600
Unit Type and Rh: 6200
Unit Type and Rh: 6200
Unit Type and Rh: 6200
Unit Type and Rh: 6200
Unit Type and Rh: 6200
Unit Type and Rh: 6200
Unit Type and Rh: 6200
Unit Type and Rh: 6200
Unit Type and Rh: 6200
Unit Type and Rh: 6200
Unit Type and Rh: 6200

## 2023-01-31 LAB — PREPARE FRESH FROZEN PLASMA
Unit division: 0
Unit division: 0
Unit division: 0
Unit division: 0
Unit division: 0
Unit division: 0
Unit division: 0
Unit division: 0
Unit division: 0
Unit division: 0

## 2023-01-31 LAB — POCT I-STAT 7, (LYTES, BLD GAS, ICA,H+H)
Acid-Base Excess: 0 mmol/L (ref 0.0–2.0)
Bicarbonate: 25.2 mmol/L (ref 20.0–28.0)
Calcium, Ion: 1.16 mmol/L (ref 1.15–1.40)
HCT: 34 % — ABNORMAL LOW (ref 39.0–52.0)
Hemoglobin: 11.6 g/dL — ABNORMAL LOW (ref 13.0–17.0)
O2 Saturation: 97 %
Patient temperature: 38
Potassium: 4 mmol/L (ref 3.5–5.1)
Sodium: 145 mmol/L (ref 135–145)
TCO2: 26 mmol/L (ref 22–32)
pCO2 arterial: 44.5 mmHg (ref 32–48)
pH, Arterial: 7.364 (ref 7.35–7.45)
pO2, Arterial: 96 mmHg (ref 83–108)

## 2023-01-31 LAB — BPAM PLATELET PHERESIS
Blood Product Expiration Date: 202404262359
Blood Product Expiration Date: 202404272359
Blood Product Expiration Date: 202404282359
ISSUE DATE / TIME: 202404260028
Unit Type and Rh: 6200
Unit Type and Rh: 6200

## 2023-01-31 LAB — CBC
HCT: 38.4 % — ABNORMAL LOW (ref 39.0–52.0)
HCT: 37 % — ABNORMAL LOW (ref 39.0–52.0)
HCT: 36.1 % — ABNORMAL LOW (ref 39.0–52.0)
Hemoglobin: 12.2 g/dL — ABNORMAL LOW (ref 13.0–17.0)
Hemoglobin: 11.9 g/dL — ABNORMAL LOW (ref 13.0–17.0)
Hemoglobin: 11.6 g/dL — ABNORMAL LOW (ref 13.0–17.0)
MCH: 28.8 pg (ref 26.0–34.0)
MCH: 29.4 pg (ref 26.0–34.0)
MCH: 29.4 pg (ref 26.0–34.0)
MCHC: 31.8 g/dL (ref 30.0–36.0)
MCHC: 32.2 g/dL (ref 30.0–36.0)
MCHC: 32.1 g/dL (ref 30.0–36.0)
MCV: 90.6 fL (ref 80.0–100.0)
MCV: 91.4 fL (ref 80.0–100.0)
MCV: 91.4 fL (ref 80.0–100.0)
Platelets: 38 10*3/uL — ABNORMAL LOW (ref 150–400)
Platelets: 32 10*3/uL — ABNORMAL LOW (ref 150–400)
Platelets: 27 10*3/uL — CL (ref 150–400)
RBC: 4.24 MIL/uL (ref 4.22–5.81)
RBC: 4.05 MIL/uL — ABNORMAL LOW (ref 4.22–5.81)
RBC: 3.95 MIL/uL — ABNORMAL LOW (ref 4.22–5.81)
RDW: 17.7 % — ABNORMAL HIGH (ref 11.5–15.5)
RDW: 17.9 % — ABNORMAL HIGH (ref 11.5–15.5)
RDW: 17.9 % — ABNORMAL HIGH (ref 11.5–15.5)
WBC: 7.1 10*3/uL (ref 4.0–10.5)
WBC: 6.9 10*3/uL (ref 4.0–10.5)
WBC: 7.2 10*3/uL (ref 4.0–10.5)
nRBC: 0 % (ref 0.0–0.2)
nRBC: 0 % (ref 0.0–0.2)
nRBC: 0.3 % — ABNORMAL HIGH (ref 0.0–0.2)

## 2023-01-31 LAB — PREPARE PLATELET PHERESIS
Unit division: 0
Unit division: 0

## 2023-01-31 LAB — PHOSPHORUS: Phosphorus: 4.2 mg/dL (ref 2.5–4.6)

## 2023-01-31 LAB — TYPE AND SCREEN
ABO/RH(D): O NEG
Antibody Screen: NEGATIVE

## 2023-01-31 LAB — GLUCOSE, CAPILLARY
Glucose-Capillary: 113 mg/dL — ABNORMAL HIGH (ref 70–99)
Glucose-Capillary: 115 mg/dL — ABNORMAL HIGH (ref 70–99)
Glucose-Capillary: 113 mg/dL — ABNORMAL HIGH (ref 70–99)
Glucose-Capillary: 121 mg/dL — ABNORMAL HIGH (ref 70–99)

## 2023-01-31 LAB — CALCIUM, IONIZED
Calcium, Ionized, Serum: 4.1 mg/dL — ABNORMAL LOW (ref 4.5–5.6)
Calcium, Ionized, Serum: 4.5 mg/dL (ref 4.5–5.6)

## 2023-01-31 LAB — MAGNESIUM: Magnesium: 2.2 mg/dL (ref 1.7–2.4)

## 2023-01-31 LAB — SURGICAL PATHOLOGY

## 2023-01-31 MED ORDER — ALBUMIN HUMAN 5 % IV SOLN
25.0000 g | Freq: Once | INTRAVENOUS | Status: AC
  Administered 2023-01-31: 25 g via INTRAVENOUS
  Filled 2023-01-31: qty 500

## 2023-01-31 MED ORDER — ACETAMINOPHEN 650 MG RE SUPP
650.0000 mg | Freq: Three times a day (TID) | RECTAL | Status: DC | PRN
  Administered 2023-01-31 – 2023-02-23 (×2): 650 mg via RECTAL
  Filled 2023-01-31 (×2): qty 1

## 2023-01-31 MED ORDER — INSULIN ASPART 100 UNIT/ML IJ SOLN
0.0000 [IU] | Freq: Four times a day (QID) | INTRAMUSCULAR | Status: DC
  Administered 2023-01-31 – 2023-02-01 (×2): 1 [IU] via SUBCUTANEOUS

## 2023-01-31 MED ORDER — ALBUMIN HUMAN 5 % IV SOLN
12.5000 g | Freq: Once | INTRAVENOUS | Status: AC
  Administered 2023-01-31: 12.5 g via INTRAVENOUS
  Filled 2023-01-31: qty 250

## 2023-01-31 MED ORDER — SODIUM CHLORIDE 0.9% IV SOLUTION: Freq: Once | INTRAVENOUS | Status: AC

## 2023-01-31 MED ORDER — TRAVASOL 10 % IV SOLN
INTRAVENOUS | Status: AC
  Filled 2023-01-31: qty 1322.4

## 2023-01-31 MED ORDER — SODIUM CHLORIDE 0.9 % IV SOLN: INTRAVENOUS | Status: DC

## 2023-01-31 NOTE — Progress Notes (Addendum)
1 Day Post-Op  Subjective: Slight increase in pressors overnight, responded to crystalloid and platelets. Levo at 6 this morning, vaso at 0.03. Adequate UOP, slight increase in BUN/Cr. Transaminases and Tbili trending up. Drain output is thin and not frankly bloody.   Objective: Vital signs in last 24 hours: Temp:  [98.6 F (37 C)-101.1 F (38.4 C)] 99.5 F (37.5 C) (04/26 0600) Pulse Rate:  [74-95] 75 (04/26 0600) Resp:  [21-24] 24 (04/26 0600) BP: (78-113)/(49-67) 113/56 (04/26 0756) SpO2:  [92 %-98 %] 94 % (04/26 0600) Arterial Line BP: (84-107)/(40-58) 102/54 (04/26 0600) FiO2 (%):  [40 %] 40 % (04/26 0756) Weight:  [101.8 kg] 101.8 kg (04/26 0700) Last BM Date : 01/30/23 (flexiseal)  Intake/Output from previous day: 04/25 0701 - 04/26 0700 In: 32440.1 [I.V.:2759.1; Blood:5707.3; IV Piggyback:2899.8] Out: 2720 [Urine:1195; Emesis/NG output:100; Drains:1425] Intake/Output this shift: Total I/O In: 112.1 [I.V.:112.1] Out: 65 [Drains:65]  PE: General: NAD Neuro: sedated, opens eyes to verbal stimuli Resp: ETT in place, on vent Vent Mode: PRVC FiO2 (%):  [40 %] 40 % Set Rate:  [24 bmp] 24 bmp Vt Set:  [630 mL] 630 mL PEEP:  [5 cmH20] 5 cmH20 Plateau Pressure:  [20 cmH20-21 cmH20] 20 cmH20 CV: RRR Abdomen: soft, nondistended. Abthera with drainage of thin serosanguinous fluid. JP with serous drainage. RUQ: T tube with bilious fluid, duodenostomy to gravity, draining succus. Extremities: edematous GU: Foley draining yellow urine   Lab Results:  Recent Labs    01/30/23 2222 01/31/23 0422 01/31/23 0619  WBC 6.9  --  7.1  HGB 12.5* 11.6* 12.2*  HCT 37.7* 34.0* 38.4*  PLT 37*  --  38*   BMET Recent Labs    01/30/23 1747 01/31/23 0422 01/31/23 0619  NA 141 145 145  K 3.9 4.0 4.0  CL 106  --  109  CO2 21*  --  23  GLUCOSE 206*  --  122*  BUN 44*  --  57*  CREATININE 1.45*  --  1.55*  CALCIUM 6.8*  --  7.5*   PT/INR Recent Labs    01/29/23 0702  01/29/23 0956  LABPROT 28.3* 17.8*  INR 2.7* 1.5*   CMP     Component Value Date/Time   NA 145 01/31/2023 0619   K 4.0 01/31/2023 0619   CL 109 01/31/2023 0619   CO2 23 01/31/2023 0619   GLUCOSE 122 (H) 01/31/2023 0619   BUN 57 (H) 01/31/2023 0619   CREATININE 1.55 (H) 01/31/2023 0619   CALCIUM 7.5 (L) 01/31/2023 0619   PROT 3.9 (L) 01/31/2023 0619   ALBUMIN 2.1 (L) 01/31/2023 0619   AST 801 (H) 01/31/2023 0619   ALT 920 (H) 01/31/2023 0619   ALKPHOS 45 01/31/2023 0619   BILITOT 4.7 (H) 01/31/2023 0619   GFRNONAA 49 (L) 01/31/2023 0619   GFRAA >60 09/23/2017 0507   Lipase  No results found for: "LIPASE"     Studies/Results: DG Chest Port 1 View  Result Date: 01/30/2023 CLINICAL DATA:  Provided history: Respiratory failure. EXAM: PORTABLE CHEST 1 VIEW COMPARISON:  Prior chest radiographs 01/29/2023 and earlier FINDINGS: ET tube present with tip at the level of the clavicular heads. An enteric tube passes below the level left hemidiaphragm and terminates outside of the field of view. Right-sided PICC with tip at the level of the superior cavoatrial junction. Right IJ approach catheter with tip at the level of the superior cavoatrial junction. Left IJ approach catheter/sheath, unchanged in position terminating at the expected  level of the left brachiocephalic vein. Esophageal temperature probe. The cardiomediastinal silhouette is unchanged. Hazy opacity at the level of the mid and lower right lung compatible with a layering pleural effusion and underlying atelectasis and/or airspace consolidation. No evidence of pneumothorax. No acute osseous abnormality identified. IMPRESSION: 1. Support apparatus as described. 2. Hazy opacity at the level of the mid and lower right lung, increased from the prior chest radiograph of 01/29/2023 and compatible with a layering pleural effusion and underlying atelectasis and/or airspace consolidation. Electronically Signed   By: Jackey Loge D.O.   On:  01/30/2023 08:28       Assessment/Plan 67 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 4/22 - Ex lap, adhesiolysis, duodenojejunal bypass, duodenostomy tube placement, T tube placement in common bile duct. 4/23 - Takeback for bleeding with hemorrhagic shock, resection of ischemic transverse colon, left in discontinuity with Abthera 4/24 - Takeback for bleeding, abdominal washout, ileocecectomy, pack placement, placement of Abthera 4/25 - Takeback, resection of 10cm necrotic distal ileum, washout, removal of packs, placement of Abthera  - Continue TPN at full strength - Elevated LFTs: Likely secondary to shock liver and massive transfusion of PRBCs. T tube is working, biliary obstruction does not seem likely. Keep T tube to gravity and continue to monitor. - Milk AKI: Continue to monitor, give volume as needed. UOP has remained adequate. - Keep T tube and D tube to gravity drainage. - Pain/sedation: Precedex and fentanyl - Full vent support - ID: Zosyn for bowel ischemia - FEN: Strict NPO, TPN - VTE: SCDs, chemical DVT ppx on hold in setting of recent bleeding and coagulopathy. - Dispo: ICU  I discussed the plan of care with the patient's wife at bedside and the patient's nurse.   LOS: 18 days    Sophronia Simas, MD [facility] General, Hepatobiliary and Pancreatic Surgery 01/31/23 8:31 AM

## 2023-01-31 NOTE — Anesthesia Postprocedure Evaluation (Signed)
Anesthesia Post Note  Patient: Bradley Dominguez  Procedure(s) Performed: REOPENING OF LAPAROTOMY, ABDOMINAL WASHOUT (Abdomen) SMALL BOWEL RESECTION (Abdomen)     Patient location during evaluation: SICU Anesthesia Type: General Level of consciousness: sedated and patient remains intubated per anesthesia plan Pain management: pain level controlled Vital Signs Assessment: post-procedure vital signs reviewed and stable Respiratory status: patient remains intubated per anesthesia plan Cardiovascular status: stable Anesthetic complications: no   No notable events documented.  Last Vitals:  Vitals:   01/31/23 0900 01/31/23 0930  BP: 100/63 102/62  Pulse: 71 71  Resp: (!) 24 (!) 24  Temp: 37.1 C 37.1 C  SpO2: 94% 94%    Last Pain:  Vitals:   01/31/23 0930  TempSrc: Esophageal  PainSc:                  Lewie Loron

## 2023-01-31 NOTE — Progress Notes (Signed)
Was notified earlier this evening pt requiring more vasopressor to maintain MAP Nurse reported frank blood in wound vac cannister of about 150cc since start of his shift Gave albumin and 1L NS bolus Ordered cbc and TEG panel  Updated later that pt responded to albumin and bolus with some decrease in vasopressor requirement Gave an addl albumin Cbc ok except for low plt TEG panel rec transfusion of plt 1 pack of plts ordered  [name]. Andrey Campanile, MD, FACS General, Bariatric, & Minimally Invasive Surgery [facility],  [facility]

## 2023-01-31 NOTE — Progress Notes (Signed)
Patient ID: Bradley Dominguez, male   DOB: 09-Dec-1955, 67 y.o.   MRN: [id number] Follow up - Trauma Critical Care   Patient Details:    Bradley Dominguez is an 67 y.o. male.  Lines/tubes : Airway 7.5 mm (Active)  Secured at (cm) 24 cm 01/31/23 0756  Measured From Lips 01/31/23 0756  Secured Location Right 01/31/23 0756  Secured By [employer] 01/31/23 0756  Tube Holder Repositioned Yes 01/31/23 0756  Prone position No 01/31/23 0756  Cuff Pressure (cm H2O) Green OR 18-26 [facility] 01/31/23 0756  Site Condition Dry 01/31/23 0756     PICC Triple Lumen 01/23/23 Right Basilic 39 cm 0 cm (Active)  Indication for Insertion or Continuance of Line Vasoactive infusions 01/30/23 2000  Exposed Catheter (cm) 0 cm 01/23/23 1840  Site Assessment Clean, Dry, Intact 01/30/23 2000  Lumen #1 Status Infusing 01/30/23 2000  Lumen #2 Status Infusing 01/30/23 2000  Lumen #3 Status Saline locked;Flushed 01/30/23 2000  Dressing Type Transparent 01/30/23 2000  Dressing Status Antimicrobial disc in place;Clean, Dry, Intact 01/30/23 2000  Safety Lock Intact 01/30/23 2000  Line Care Lumen 3 cap changed;Lumen 3 tubing changed 01/29/23 1729  Line Adjustment (NICU/IV Team Only) No 01/24/23 2000  Dressing Intervention Dressing changed;Antimicrobial disc changed;Securement device changed 01/30/23 1739  Dressing Change Due 02/06/23 01/30/23 2000     CVC Single Lumen 01/27/23 Left Internal jugular (Active)  Indication for Insertion or Continuance of Line Vasoactive infusions 01/30/23 2000  Site Assessment Clean, Dry, Intact 01/30/23 2000  Line Status Flushed;Saline locked 01/30/23 2000  Dressing Type Transparent 01/30/23 2000  Dressing Status Antimicrobial disc in place;Clean, Dry, Intact 01/30/23 2000  Line Care Connections checked and tightened 01/29/23 1100  Dressing Intervention New dressing 01/28/23 0800  Dressing Change Due 02/04/23 01/30/23 2000     CVC Triple Lumen 01/28/23 Right Internal jugular  (Active)  Indication for Insertion or Continuance of Line Vasoactive infusions 01/30/23 2000  Site Assessment Clean, Dry, Intact 01/30/23 2000  Proximal Lumen Status Flushed;Blood return noted 01/30/23 2000  Medial Lumen Status Infusing 01/30/23 2000  Distal Lumen Status Infusing 01/30/23 2000  Dressing Type Transparent 01/30/23 2000  Dressing Status Antimicrobial disc in place;Clean, Dry, Intact 01/30/23 2000  Line Care Proximal tubing changed;Proximal cap changed;Connections checked and tightened 01/30/23 1739  Dressing Intervention New dressing;Dressing changed;Antimicrobial disc changed 01/29/23 1100  Dressing Change Due 02/05/23 01/30/23 2000     Arterial Line 01/27/23 Right Radial (Active)  Site Assessment Clean, Dry, Intact 01/30/23 2000  Line Status Pulsatile blood flow 01/30/23 2000  Art Line Waveform Appropriate 01/30/23 2000  Art Line Interventions Connections checked and tightened 01/30/23 2000  Color/Movement/Sensation Capillary refill less than 3 sec 01/30/23 2000  Dressing Type Transparent 01/30/23 2000  Dressing Status Clean, Dry, Intact 01/30/23 2000  Dressing Change Due 02/03/23 01/30/23 2000     Closed System Drain 1 Left Abdomen Bulb (JP) 19 Fr. (Active)  Site Description Unremarkable 01/30/23 2000  Dressing Status None 01/30/23 2000  Drainage Appearance Bloody;Dark red 01/30/23 2000  Status To suction (Charged) 01/30/23 2000  Output (mL) 5 mL 01/31/23 0800     Negative Pressure Wound Therapy Abdomen Anterior;Mid (Active)  Last dressing change 01/30/23 01/30/23 1608  Site / Wound Assessment Dressing in place / Unable to assess 01/30/23 2000  Peri-wound Assessment Intact 01/30/23 0745  Wound filler - Nonadherent 1 01/29/23 0749  Cycle Continuous 01/30/23 2000  Target Pressure (mmHg) 75 01/30/23 2000  Canister Changed Yes 01/31/23 0600  Machine plugged into wall  outlet (NOT bed outlet) Yes 01/30/23 0745  Dressing Status Intact 01/30/23 2000  Drainage Amount  Moderate 01/30/23 2000  Drainage Description Sanguineous 01/30/23 2000  Output (mL) 0 mL 01/31/23 0800     Biliary Tube T-tube 10 Fr. RUQ (Active)  Site Description Unremarkable 01/30/23 2000  Dressing Status None 01/30/23 2000  Drainage Color Brown 01/30/23 2000  Tube Status/Interventions Open to gravity drainage 01/30/23 2000  Output (mL) 0 mL 01/31/23 0800     NG/OG Vented/Dual Lumen Right nare (Active)  Tube Position (Required) External length of tube 01/30/23 0745  Measurement (cm) (Required) 73 cm 01/30/23 0745  Ongoing Placement Verification (Required) (See row information) Yes 01/30/23 0745  Site Assessment Clean, Dry, Intact 01/30/23 0745  Interventions Retaped 01/29/23 0830  Status Low intermittent suction 01/30/23 0745  Amount of suction 70 mmHg 01/29/23 2000  Drainage Appearance Dark red 01/29/23 2000  Intake (mL) 50 mL 01/24/23 1800  Output (mL) 0 mL 01/31/23 0800     Gastrostomy/Enterostomy Other (Comment) 24 Fr. RUQ (Active)  Output (mL) 60 mL 01/31/23 0800     Urethral Catheter Badi Ali RN Latex 16 Fr. (Active)  Indication for Insertion or Continuance of Catheter Therapy based on hourly urine output monitoring and documentation for critical condition (NOT STRICT I&O) 01/31/23 0800  Site Assessment Clean, Dry, Intact 01/31/23 0800  Catheter Maintenance Bag below level of bladder;Drainage bag/tubing not touching floor;Catheter secured;No dependent loops;Seal intact;Insertion date on drainage bag 01/31/23 0800  Collection Container Standard drainage bag 01/31/23 0800  Securement Method Securing device (Describe) 01/31/23 0800  Urinary Catheter Interventions (if applicable) Unclamped 01/31/23 0800  Output (mL) 0 mL 01/31/23 0600    Microbiology/Sepsis markers: Results for orders placed or performed during the hospital encounter of 01/13/23  MRSA Next Gen by PCR, Nasal     Status: None   Collection Time: 01/28/23 12:12 PM   Specimen: Nasal Mucosa; Nasal Swab   Result Value Ref Range Status   MRSA by PCR Next Gen NOT DETECTED NOT DETECTED Final    Comment: (NOTE) The GeneXpert MRSA Assay (FDA approved for NASAL specimens only), is one component of a comprehensive MRSA colonization surveillance program. It is not intended to diagnose MRSA infection nor to guide or monitor treatment for MRSA infections. Test performance is not FDA approved in patients less than 65 years old. Performed at [facility], [address] [place], [place] [address]   Urine Culture (for pregnant, neutropenic or urologic patients or patients with an indwelling urinary catheter)     Status: None   Collection Time: 01/29/23  2:03 PM   Specimen: Urine, Catheterized  Result Value Ref Range Status   Specimen Description URINE, CATHETERIZED  Final   Special Requests NONE  Final   Culture   Final    NO GROWTH Performed at [facility], [address] [place], [place] [address]    Report Status 01/30/2023 FINAL  Final    Anti-infectives:  Anti-infectives (From admission, onward)    Start     Dose/Rate Route Frequency Ordered Stop   01/28/23 1000  piperacillin-tazobactam (ZOSYN) IVPB 3.375 g        3.375 g 12.5 mL/hr over 240 Minutes Intravenous Every 8 hours 01/28/23 0902     01/27/23 1030  ceFAZolin (ANCEF) IVPB 2g/100 mL premix       See Hyperspace for full Linked Orders Report.   2 g 200 mL/hr over 30 Minutes Intravenous On call to O.R. 01/27/23 0981 01/27/23  1135   01/27/23 1030  metroNIDAZOLE (FLAGYL) IVPB 500 mg       See Hyperspace for full Linked Orders Report.   500 mg 100 mL/hr over 60 Minutes Intravenous On call to O.R. 01/27/23 0746 01/27/23 1141   01/19/23 0600  cefTRIAXone (ROCEPHIN) 2 g in sodium chloride 0.9 % 100 mL IVPB       See Hyperspace for full Linked Orders Report.   2 g 200 mL/hr over 30 Minutes Intravenous On call to O.R. 01/19/23 0350 01/19/23 0606   01/19/23 0350  metroNIDAZOLE (FLAGYL) IVPB 500 mg  Status:  Discontinued        See Hyperspace for full Linked Orders Report.   500 mg 100 mL/hr over 60 Minutes Intravenous Every 8 hours 01/19/23 0350 01/19/23 0752      Subjective:    Overnight Issues:  Increased pressor requirement and bloody VAC drainage briefly as above Objective:  Vital signs for last 24 hours: Temp:  [98.6 F (37 C)-101.1 F (38.4 C)] 98.8 F (37.1 C) (04/26 0900) Pulse Rate:  [71-95] 71 (04/26 0900) Resp:  [21-24] 24 (04/26 0900) BP: (78-115)/(49-67) 115/61 (04/26 0800) SpO2:  [92 %-98 %] 94 % (04/26 0900) Arterial Line BP: (84-116)/(40-58) 116/55 (04/26 0900) FiO2 (%):  [40 %] 40 % (04/26 0756) Weight:  [101.8 kg] 101.8 kg (04/26 0700)  Hemodynamic parameters for last 24 hours:    Intake/Output from previous day: 04/25 0701 - 04/26 0700 In: 09811.9 [I.V.:2759.1; Blood:5707.3; IV Piggyback:2899.8] Out: 2720 [Urine:1195; Emesis/NG output:100; Drains:1425]  Intake/Output this shift: Total I/O In: 224.2 [I.V.:224.2] Out: 65 [Drains:65]  Vent settings for last 24 hours: Vent Mode: PRVC FiO2 (%):  [40 %] 40 % Set Rate:  [24 bmp] 24 bmp Vt Set:  [147 mL] 630 mL PEEP:  [5 cmH20] 5 cmH20 Plateau Pressure:  [20 cmH20-21 cmH20] 20 cmH20  Physical Exam:  General: on vent Neuro: F/C well Resp: clear to auscultation bilaterally CVS: RRR GI: open abd VAC, T tube and D tube draining, JP SS Extremities: edema 2+  Results for orders placed or performed during the hospital encounter of 01/13/23 (from the past 24 hour(s))  BLOOD TRANSFUSION REPORT - SCANNED     Status: None   Collection Time: 01/30/23 10:01 AM   Narrative   Ordered by an unspecified provider.  Glucose, capillary     Status: None   Collection Time: 01/30/23 11:15 AM  Result Value Ref Range   Glucose-Capillary 77 70 - 99 mg/dL  CBC     Status: Abnormal   Collection Time: 01/30/23  5:47 PM  Result Value Ref Range   WBC 5.1 4.0 - 10.5 K/uL   RBC 4.06 (L) 4.22 - 5.81 MIL/uL   Hemoglobin 11.7 (L) 13.0 - 17.0  g/dL   HCT 82.9 (L) 56.2 - 13.0 %   MCV 90.6 80.0 - 100.0 fL   MCH 28.8 26.0 - 34.0 pg   MCHC 31.8 30.0 - 36.0 g/dL   RDW 86.5 (H) 78.4 - 69.6 %   Platelets 39 (L) 150 - 400 K/uL   nRBC 0.0 0.0 - 0.2 %  Basic metabolic panel     Status: Abnormal   Collection Time: 01/30/23  5:47 PM  Result Value Ref Range   Sodium 141 135 - 145 mmol/L   Potassium 3.9 3.5 - 5.1 mmol/L   Chloride 106 98 - 111 mmol/L   CO2 21 (L) 22 - 32 mmol/L   Glucose, Bld 206 (H) 70 - 99 mg/dL  BUN 44 (H) 8 - 23 mg/dL   Creatinine, Ser 1.61 (H) 0.61 - 1.24 mg/dL   Calcium 6.8 (L) 8.9 - 10.3 mg/dL   GFR, Estimated 53 (L) >60 mL/min   Anion gap 14 5 - 15  Magnesium     Status: None   Collection Time: 01/30/23  5:47 PM  Result Value Ref Range   Magnesium 2.1 1.7 - 2.4 mg/dL  Glucose, capillary     Status: None   Collection Time: 01/30/23  7:53 PM  Result Value Ref Range   Glucose-Capillary 89 70 - 99 mg/dL  Trauma TEG Panel     Status: Abnormal   Collection Time: 01/30/23 10:02 PM  Result Value Ref Range   Citrated Kaolin (R) 6.9 4.6 - 9.1 min   Citrated Rapid TEG (MA) 47 (L) 52 - 70 mm   CFF Max Amplitude 14.8 (L) 15 - 32 mm   Lysis at 30 Minutes 0 0.0 - 2.6 %  CBC     Status: Abnormal   Collection Time: 01/30/23 10:22 PM  Result Value Ref Range   WBC 6.9 4.0 - 10.5 K/uL   RBC 4.26 4.22 - 5.81 MIL/uL   Hemoglobin 12.5 (L) 13.0 - 17.0 g/dL   HCT 09.6 (L) 04.5 - 40.9 %   MCV 88.5 80.0 - 100.0 fL   MCH 29.3 26.0 - 34.0 pg   MCHC 33.2 30.0 - 36.0 g/dL   RDW 81.1 (H) 91.4 - 78.2 %   Platelets 37 (L) 150 - 400 K/uL   nRBC 0.3 (H) 0.0 - 0.2 %  Glucose, capillary     Status: Abnormal   Collection Time: 01/30/23 11:53 PM  Result Value Ref Range   Glucose-Capillary 104 (H) 70 - 99 mg/dL  Prepare platelet pheresis     Status: None (Preliminary result)   Collection Time: 01/31/23 12:06 AM  Result Value Ref Range   Unit Number [id number]    Blood Component Type PSORALEN TREATED    Unit division 00     Status of Unit REL FROM [facility]    Transfusion Status OK TO TRANSFUSE    Unit Number [id number]    Blood Component Type PLTP1 PSORALEN TREATED    Unit division 00    Status of Unit ISSUED    Transfusion Status      OK TO TRANSFUSE Performed at [facility], [address] [place], [place] [address]   Glucose, capillary     Status: Abnormal   Collection Time: 01/31/23  3:25 AM  Result Value Ref Range   Glucose-Capillary 113 (H) 70 - 99 mg/dL  I-STAT 7, (LYTES, BLD GAS, ICA, H+H)     Status: Abnormal   Collection Time: 01/31/23  4:22 AM  Result Value Ref Range   pH, Arterial 7.364 7.35 - 7.45   pCO2 arterial 44.5 32 - 48 mmHg   pO2, Arterial 96 83 - 108 mmHg   Bicarbonate 25.2 20.0 - 28.0 mmol/L   TCO2 26 22 - 32 mmol/L   O2 Saturation 97 %   Acid-Base Excess 0.0 0.0 - 2.0 mmol/L   Sodium 145 135 - 145 mmol/L   Potassium 4.0 3.5 - 5.1 mmol/L   Calcium, Ion 1.16 1.15 - 1.40 mmol/L   HCT 34.0 (L) 39.0 - 52.0 %   Hemoglobin 11.6 (L) 13.0 - 17.0 g/dL   Patient temperature 72.5 C    Collection site RADIAL, ALLEN'S TEST ACCEPTABLE    Drawn by RT  Sample type ARTERIAL   CBC     Status: Abnormal   Collection Time: 01/31/23  6:19 AM  Result Value Ref Range   WBC 7.1 4.0 - 10.5 K/uL   RBC 4.24 4.22 - 5.81 MIL/uL   Hemoglobin 12.2 (L) 13.0 - 17.0 g/dL   HCT 16.1 (L) 09.6 - 04.5 %   MCV 90.6 80.0 - 100.0 fL   MCH 28.8 26.0 - 34.0 pg   MCHC 31.8 30.0 - 36.0 g/dL   RDW 40.9 (H) 81.1 - 91.4 %   Platelets 38 (L) 150 - 400 K/uL   nRBC 0.0 0.0 - 0.2 %  Comprehensive metabolic panel     Status: Abnormal   Collection Time: 01/31/23  6:19 AM  Result Value Ref Range   Sodium 145 135 - 145 mmol/L   Potassium 4.0 3.5 - 5.1 mmol/L   Chloride 109 98 - 111 mmol/L   CO2 23 22 - 32 mmol/L   Glucose, Bld 122 (H) 70 - 99 mg/dL   BUN 57 (H) 8 - 23 mg/dL   Creatinine, Ser 7.82 (H) 0.61 - 1.24 mg/dL   Calcium 7.5 (L) 8.9 - 10.3 mg/dL   Total Protein 3.9 (L) 6.5 - 8.1 g/dL   Albumin  2.1 (L) 3.5 - 5.0 g/dL   AST 956 (H) 15 - 41 U/L   ALT 920 (H) 0 - 44 U/L   Alkaline Phosphatase 45 38 - 126 U/L   Total Bilirubin 4.7 (H) 0.3 - 1.2 mg/dL   GFR, Estimated 49 (L) >60 mL/min   Anion gap 13 5 - 15  Magnesium     Status: None   Collection Time: 01/31/23  6:19 AM  Result Value Ref Range   Magnesium 2.2 1.7 - 2.4 mg/dL  Phosphorus     Status: None   Collection Time: 01/31/23  6:19 AM  Result Value Ref Range   Phosphorus 4.2 2.5 - 4.6 mg/dL  Glucose, capillary     Status: Abnormal   Collection Time: 01/31/23  8:26 AM  Result Value Ref Range   Glucose-Capillary 115 (H) 70 - 99 mg/dL    Assessment & Plan: Present on Admission:  Bowel obstruction (HCC)  Protein-calorie malnutrition, severe (HCC)  Duodenal obstruction    LOS: 18 days   Additional comments:I reviewed the patient's new clinical lab test results. 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 4/23 - Takeback for bleeding with hemorrhagic shock, resection of ischemic transverse colon, left in discontinuity with Abthera 4/24 - Takeback for bleeding, abdominal washout, ileocecectomy, placement of Abthera Continue D tube and T tube to gravity Return to OR tomorrow with Dr. Freida Busman for possible ileostomy and closure Massive transfusion - received PLTs overnight. Likely will need them for OR in AM. INR 1.5 and with some bleeding last night will give 1u FFP. Shock - hemorrhagic etiology, albumin x 2 now AKI - albumin boluses now as above, IVF 100/h. Trend creatinine and UOP. Acute hypoxic ventilator dependent respiratory failure - full support with open abdomen, now on 40% and PEEP 5 FEN - TPN, IVF and albumin above DVT - SCDs, hold chemical ppx in light of active bleeding Foley - to remain  ID  - UTI R/O - urine CX neg, empiric zosyn for bowel ischemia, started 4/23 Dispo -  ICU, OR in AM Critical Care Total Time*: 37 Minutes  Violeta Gelinas, MD, MPH, FACS Trauma & General Surgery Use [email or web address] to contact on call provider  01/31/2023  *Care during the described time interval was provided by me. I have reviewed this patient's available data, including medical history, events of note, physical examination and test results as part of my evaluation.

## 2023-01-31 NOTE — Progress Notes (Signed)
Date and time results received: 01/31/23 2235  Test: CBC - Platelet Critical Value: 27  Name of Provider Notified: Dr. Freida Busman. Wait until on the way to OR in the morning to give transfusion.

## 2023-01-31 NOTE — Anesthesia Preprocedure Evaluation (Signed)
Anesthesia Evaluation  Patient identified by MRN, date of birth, ID band Patient unresponsive    Reviewed: Allergy & Precautions, Patient's Chart, lab work & pertinent test results  History of Anesthesia Complications (+) Family history of anesthesia reaction and history of anesthetic complications (father slow to waken, mother with PONV)  Airway Mallampati: Intubated      Comment: Previous grade I view with [facility]   Pulmonary neg pulmonary ROS    + decreased breath sounds  (-) rales    Cardiovascular +CHF  negative cardio ROS + dysrhythmias (1st degree AV block)  Rhythm:Regular Rate:Tachycardia  Echocardiogram 12/11/2018 : Left ventricle cavity is normal in size. Normal global wall motion. Normal diastolic filling pattern. Calculated EF 55%. Trileaflet aortic valve with trace regurgitation. Trace to mild Mild (Grade I) mitral regurgitation. Trace tricuspid regurgitation. Unable to estimate PA pressure due to absence/minimal TR signal. Trace pulmonic regurgitation. Essentially normal echocardiogram.  Normal stress test 12/02/2018     Neuro/Psych negative neurological ROS     GI/Hepatic negative GI ROS, Neg liver ROS,,,  Endo/Other  negative endocrine ROS    Renal/GU negative Renal ROS     Musculoskeletal negative musculoskeletal ROS (+) Arthritis ,    Abdominal   Peds  Hematology negative hematology ROS (+)   Anesthesia Other Findings 66M with massive GI bleeding secondary to small GJ ulceration with a pseudoaneurysm of the jejunal arcade  Duodenal obstruction and UGIB - S/P: 4/22 - Ex lap, adhesiolysis, duodenojejunal bypass, duodenostomy tube placement, T tube placement in common bile duct. 4/23 - Takeback for bleeding with hemorrhagic shock, resection of ischemic transverse colon, left in discontinuity with Abthera 4/24 - Takeback for bleeding, abdominal washout, ileocecectomy, placement of  Abthera Return to OR today with Dr. Allen for washout and possible closure Massive transfusion - rec'd 9/4/1/1 on 4/22, 1/0/3/1 on 4/22, MTP initiated again yesterday and went back to OR. Today Hb 13.4, PLTs 44k and will get PLTs now as going to OR Shock - hemorrhagic etiology, better with surgery and resuscitation, albumin bolus, wean levo as able (at 10 now) AKI - albumin bolus now, UTI as below, Trend creatinine and UOP. VDRF - full support, now on 40% and PEEP 5, ABG good   Reproductive/Obstetrics                             Anesthesia Physical Anesthesia Plan  ASA: 4  Anesthesia Plan: General   Post-op Pain Management: Minimal or no pain anticipated   Induction: Intravenous  PONV Risk Score and Plan: 2 and Treatment may vary due to age or medical condition  Airway Management Planned: Oral ETT  Additional Equipment: Arterial line  Intra-op Plan:   Post-operative Plan: Post-operative intubation/ventilation  Informed Consent:      History available from chart only  Plan Discussed with: Anesthesiologist and CRNA  Anesthesia Plan Comments:         Anesthesia Quick Evaluation  

## 2023-01-31 NOTE — Progress Notes (Addendum)
PHARMACY - TOTAL PARENTERAL NUTRITION CONSULT NOTE  Indication: Duodenal obstruction with ileus  Patient Measurements: Height: 6\' 1"  (185.4 cm) Weight: 93.6 kg (206 lb 5.6 oz) IBW/kg (Calculated) : 79.9 TPN AdjBW (KG): 65.5 Body mass index is 27.22 kg/m.  Assessment:  26 yoM admitted on 4/8 with N/V, chronic partial SBO, history of multiple previous abdominal surgeries. Pt has very minimal PO intake with ongoing weight loss.  Per documentation, patient has severe PCM.  Pharmacy consulted to manage TPN.   Glucose / Insulin: no hx DM - CBGs well controlled and CBGs/SSI d/c on 4/20. 4/23 severe hyperglycemia post op and started on insulin gtt >> transitioned to SSI and CBGs low normal. 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 Placed biliary T-tube, duodenostomy tube - 4/22 2nd OR: ex lap with transverse colectomy, abd packed & left open w/ Abthera dressing - 4/24 OR: acute decompensation >> ex-lap with ileocectomy, temporary abd closure with neg pressure dressing.  MTP. - 4/25: ex-lap with washout, distal ileum resection, temp abd closure  Central access: double lumen PICC placed 01/13/23 TPN start date: 01/14/23  Nutritional Goals: RD Estimated Needs Total Energy Estimated Needs:  2050-2250 Total Protein Estimated Needs: 130-145 grams Total Fluid Estimated Needs: 2.1L/day  Current Nutrition:   TPN  Plan:  Adjust TPN to meet new estimated needs  TPN at 95 ml/hr will provide 132g AA and 2075 kCal, meeting 100% of needs Electrolytes in TPN: reduce Na further to 58mEq/L, 25 mEq/L (= 57 mEq/day, no increase with risk for worsening renal fxn as discussed with Trauma, Ca 10 mEq/L, Mg 9 mEq/L, reduce Phos further to 38mmol/L, Cl:Ac 2:1 Add standard MVI and trace elements to [id number] Reduce sensitive SSI to Q6H Monitor TPN labs on Mon/Thurs - CMP in AM per MD Monitor for jaundice Possible OR 4/27  Bradley Dominguez, PharmD, BCPS, BCCCP 01/31/2023, 10:11 AM

## 2023-02-01 ENCOUNTER — Inpatient Hospital Stay (HOSPITAL_COMMUNITY): Payer: BC Managed Care – PPO | Admitting: Anesthesiology

## 2023-02-01 ENCOUNTER — Encounter (HOSPITAL_COMMUNITY): Admission: AD | Disposition: E | Payer: Self-pay | Source: Ambulatory Visit

## 2023-02-01 ENCOUNTER — Other Ambulatory Visit: Payer: Self-pay

## 2023-02-01 HISTORY — PX: GASTROSTOMY: SHX5249

## 2023-02-01 HISTORY — PX: LAPAROTOMY: SHX154

## 2023-02-01 LAB — CBC
HCT: 37.1 % — ABNORMAL LOW (ref 39.0–52.0)
HCT: 36.4 % — ABNORMAL LOW (ref 39.0–52.0)
Hemoglobin: 11.8 g/dL — ABNORMAL LOW (ref 13.0–17.0)
Hemoglobin: 11.5 g/dL — ABNORMAL LOW (ref 13.0–17.0)
MCH: 29.1 pg (ref 26.0–34.0)
MCH: 29.2 pg (ref 26.0–34.0)
MCHC: 31.8 g/dL (ref 30.0–36.0)
MCHC: 31.6 g/dL (ref 30.0–36.0)
MCV: 91.6 fL (ref 80.0–100.0)
MCV: 92.4 fL (ref 80.0–100.0)
Platelets: 28 10*3/uL — CL (ref 150–400)
Platelets: 46 10*3/uL — ABNORMAL LOW (ref 150–400)
RBC: 4.05 MIL/uL — ABNORMAL LOW (ref 4.22–5.81)
RBC: 3.94 MIL/uL — ABNORMAL LOW (ref 4.22–5.81)
RDW: 18.1 % — ABNORMAL HIGH (ref 11.5–15.5)
RDW: 18 % — ABNORMAL HIGH (ref 11.5–15.5)
WBC: 8.1 10*3/uL (ref 4.0–10.5)
WBC: 9.3 10*3/uL (ref 4.0–10.5)
nRBC: 0 % (ref 0.0–0.2)
nRBC: 0 % (ref 0.0–0.2)

## 2023-02-01 LAB — BPAM RBC
Blood Product Expiration Date: 202405232359
Blood Product Expiration Date: 202405232359
Blood Product Expiration Date: 202405262359
Blood Product Expiration Date: 202405262359
Blood Product Expiration Date: 202405262359
Blood Product Expiration Date: 202405262359
Blood Product Expiration Date: 202405262359
Blood Product Expiration Date: 202405262359
Blood Product Expiration Date: 202405262359
ISSUE DATE / TIME: 202404240643
ISSUE DATE / TIME: 202404240643
ISSUE DATE / TIME: 202404240710
ISSUE DATE / TIME: 202404240710
ISSUE DATE / TIME: 202404240724
ISSUE DATE / TIME: 202404240738
ISSUE DATE / TIME: 202404240738
ISSUE DATE / TIME: 202404240738
ISSUE DATE / TIME: 202404240738
ISSUE DATE / TIME: 202404240738
ISSUE DATE / TIME: 202404240738
Unit Type and Rh: 5100
Unit Type and Rh: 5100
Unit Type and Rh: 5100
Unit Type and Rh: 5100
Unit Type and Rh: 5100
Unit Type and Rh: 5100
Unit Type and Rh: 5100
Unit Type and Rh: 5100
Unit Type and Rh: 5100

## 2023-02-01 LAB — COMPREHENSIVE METABOLIC PANEL
ALT: 870 U/L — ABNORMAL HIGH (ref 0–44)
AST: 510 U/L — ABNORMAL HIGH (ref 15–41)
Albumin: 2.1 g/dL — ABNORMAL LOW (ref 3.5–5.0)
Alkaline Phosphatase: 47 U/L (ref 38–126)
Anion gap: 7 (ref 5–15)
BUN: 72 mg/dL — ABNORMAL HIGH (ref 8–23)
CO2: 23 mmol/L (ref 22–32)
Calcium: 7.5 mg/dL — ABNORMAL LOW (ref 8.9–10.3)
Chloride: 113 mmol/L — ABNORMAL HIGH (ref 98–111)
Creatinine, Ser: 1.38 mg/dL — ABNORMAL HIGH (ref 0.61–1.24)
GFR, Estimated: 56 mL/min — ABNORMAL LOW (ref >60)
Glucose, Bld: 128 mg/dL — ABNORMAL HIGH (ref 70–99)
Potassium: 3.7 mmol/L (ref 3.5–5.1)
Sodium: 143 mmol/L (ref 135–145)
Total Bilirubin: 6.2 mg/dL — ABNORMAL HIGH (ref 0.3–1.2)
Total Protein: 4.2 g/dL — ABNORMAL LOW (ref 6.5–8.1)

## 2023-02-01 LAB — TYPE AND SCREEN
ABO/RH(D): O NEG
Unit division: 0
Unit division: 0
Unit division: 0
Unit division: 0
Unit division: 0
Unit division: 0
Unit division: 0
Unit division: 0
Unit division: 0

## 2023-02-01 LAB — POCT I-STAT 7, (LYTES, BLD GAS, ICA,H+H)
Acid-base deficit: 3 mmol/L — ABNORMAL HIGH (ref 0.0–2.0)
Bicarbonate: 23.7 mmol/L (ref 20.0–28.0)
Calcium, Ion: 1.12 mmol/L — ABNORMAL LOW (ref 1.15–1.40)
HCT: 34 % — ABNORMAL LOW (ref 39.0–52.0)
Hemoglobin: 11.6 g/dL — ABNORMAL LOW (ref 13.0–17.0)
O2 Saturation: 99 %
Potassium: 3.5 mmol/L (ref 3.5–5.1)
Sodium: 148 mmol/L — ABNORMAL HIGH (ref 135–145)
TCO2: 25 mmol/L (ref 22–32)
pCO2 arterial: 49.5 mmHg — ABNORMAL HIGH (ref 32–48)
pH, Arterial: 7.289 — ABNORMAL LOW (ref 7.35–7.45)
pO2, Arterial: 147 mmHg — ABNORMAL HIGH (ref 83–108)

## 2023-02-01 LAB — CALCIUM, IONIZED: Calcium, Ionized, Serum: 4.8 mg/dL (ref 4.5–5.6)

## 2023-02-01 LAB — BPAM FFP
ISSUE DATE / TIME: 202404260924
Unit Type and Rh: 9500

## 2023-02-01 LAB — PREPARE PLATELET PHERESIS: Unit division: 0

## 2023-02-01 LAB — GLUCOSE, CAPILLARY
Glucose-Capillary: 116 mg/dL — ABNORMAL HIGH (ref 70–99)
Glucose-Capillary: 108 mg/dL — ABNORMAL HIGH (ref 70–99)
Glucose-Capillary: 109 mg/dL — ABNORMAL HIGH (ref 70–99)

## 2023-02-01 LAB — BPAM PLATELET PHERESIS
Blood Product Expiration Date: 202404292359
Blood Product Expiration Date: 202404272359
ISSUE DATE / TIME: 202404270702
ISSUE DATE / TIME: 202404270827
ISSUE DATE / TIME: 202404260028

## 2023-02-01 LAB — SURGICAL PCR SCREEN
MRSA, PCR: NEGATIVE
Staphylococcus aureus: POSITIVE — AB

## 2023-02-01 LAB — PROTIME-INR
INR: 1.5 — ABNORMAL HIGH (ref 0.8–1.2)
Prothrombin Time: 17.7 seconds — ABNORMAL HIGH (ref 11.4–15.2)

## 2023-02-01 LAB — PREPARE FRESH FROZEN PLASMA

## 2023-02-01 SURGERY — LAPAROTOMY, EXPLORATORY
Anesthesia: General | Site: Abdomen

## 2023-02-01 MED ORDER — LACTATED RINGERS IV SOLN: INTRAVENOUS | Status: DC | PRN

## 2023-02-01 MED ORDER — SODIUM CHLORIDE 0.9% IV SOLUTION: Freq: Once | INTRAVENOUS | Status: DC

## 2023-02-01 MED ORDER — TRAVASOL 10 % IV SOLN
INTRAVENOUS | Status: DC
  Filled 2023-02-01: qty 1322.4

## 2023-02-01 MED ORDER — ROCURONIUM BROMIDE 10 MG/ML (PF) SYRINGE
PREFILLED_SYRINGE | INTRAVENOUS | Status: DC | PRN
  Administered 2023-02-01: 100 mg via INTRAVENOUS

## 2023-02-01 MED ORDER — SODIUM CHLORIDE 0.9% IV SOLUTION: Freq: Once | INTRAVENOUS | Status: AC

## 2023-02-01 MED ORDER — 0.9 % SODIUM CHLORIDE (POUR BTL) OPTIME
TOPICAL | Status: DC | PRN
  Administered 2023-02-01: 1000 mL
  Administered 2023-02-01: 2000 mL

## 2023-02-01 MED ORDER — MUPIROCIN 2 % EX OINT
1.0000 | TOPICAL_OINTMENT | Freq: Two times a day (BID) | CUTANEOUS | Status: AC
  Administered 2023-02-01 – 2023-02-05 (×10): 1 via NASAL
  Filled 2023-02-01: qty 22

## 2023-02-01 MED ORDER — ZINC CHLORIDE 1 MG/ML IV SOLN
INTRAVENOUS | Status: AC
  Filled 2023-02-01: qty 1322.4

## 2023-02-01 SURGICAL SUPPLY — 62 items
ADH SKN CLS APL DERMABOND .7 (GAUZE/BANDAGES/DRESSINGS)
APL PRP STRL LF DISP 70% ISPRP (MISCELLANEOUS)
BAG COUNTER SPONGE SURGICOUNT (BAG) ×2 IMPLANT
BAG SPNG CNTER NS LX DISP (BAG) ×2
BLADE CLIPPER SURG (BLADE) IMPLANT
BNDG GAUZE DERMACEA FLUFF 4 (GAUZE/BANDAGES/DRESSINGS) IMPLANT
BNDG GZE DERMACEA 4 6PLY (GAUZE/BANDAGES/DRESSINGS) ×2
BRR ADH 5X3 SEPRAFILM 6 SHT (MISCELLANEOUS) ×2
CANISTER SUCT 3000ML PPV (MISCELLANEOUS) ×2 IMPLANT
CHLORAPREP W/TINT 26 (MISCELLANEOUS) ×2 IMPLANT
COVER SURGICAL LIGHT HANDLE (MISCELLANEOUS) ×2 IMPLANT
DERMABOND ADVANCED .7 DNX12 (GAUZE/BANDAGES/DRESSINGS) ×4 IMPLANT
DRAPE INCISE IOBAN 66X45 STRL (DRAPES) ×2 IMPLANT
DRAPE LAPAROSCOPIC ABDOMINAL (DRAPES) ×2 IMPLANT
DRAPE WARM FLUID 44X44 (DRAPES) ×2 IMPLANT
DRSG OPSITE POSTOP 4X10 (GAUZE/BANDAGES/DRESSINGS) IMPLANT
DRSG OPSITE POSTOP 4X8 (GAUZE/BANDAGES/DRESSINGS) IMPLANT
ELECT BLADE 6.5 EXT (BLADE) IMPLANT
ELECT CAUTERY BLADE 6.4 (BLADE) ×2 IMPLANT
ELECT REM PT RETURN 9FT ADLT (ELECTROSURGICAL) ×2
ELECTRODE REM PT RTRN 9FT ADLT (ELECTROSURGICAL) ×2 IMPLANT
GAUZE PAD ABD 8X10 STRL (GAUZE/BANDAGES/DRESSINGS) IMPLANT
GLOVE BIOGEL PI IND STRL 6 (GLOVE) ×2 IMPLANT
GLOVE BIOGEL PI MICRO STRL 5.5 (GLOVE) ×2 IMPLANT
GOWN STRL REUS W/ TWL LRG LVL3 (GOWN DISPOSABLE) ×4 IMPLANT
GOWN STRL REUS W/TWL LRG LVL3 (GOWN DISPOSABLE) ×4
HANDLE SUCTION POOLE (INSTRUMENTS) ×2 IMPLANT
J-TUBE MIC 16FX51 UNV ENFIT (TUBING) IMPLANT
KIT BASIN OR (CUSTOM PROCEDURE TRAY) ×2 IMPLANT
KIT OSTOMY DRAINABLE 2.75 STR (WOUND CARE) IMPLANT
KIT TURNOVER KIT B (KITS) ×2 IMPLANT
LIGASURE IMPACT 36 18CM CVD LR (INSTRUMENTS) IMPLANT
NS IRRIG 1000ML POUR BTL (IV SOLUTION) ×4 IMPLANT
PACK GENERAL/GYN (CUSTOM PROCEDURE TRAY) ×2 IMPLANT
PAD ARMBOARD 7.5X6 YLW CONV (MISCELLANEOUS) ×2 IMPLANT
PENCIL SMOKE EVACUATOR (MISCELLANEOUS) ×2 IMPLANT
SEPRAFILM PROCEDURAL PACK 3X5 (MISCELLANEOUS) IMPLANT
SLEEVE SUCTION CATH 165 (SLEEVE) ×2 IMPLANT
SOL PREP POV-IOD 4OZ 10% (MISCELLANEOUS) IMPLANT
SPECIMEN JAR LARGE (MISCELLANEOUS) IMPLANT
SPONGE T-LAP 18X18 ~~LOC~~+RFID (SPONGE) IMPLANT
STAPLER VISISTAT 35W (STAPLE) IMPLANT
SUCTION POOLE HANDLE (INSTRUMENTS) ×2
SUT ETHILON 2 0 FS 18 (SUTURE) IMPLANT
SUT MNCRL AB 4-0 PS2 18 (SUTURE) ×4 IMPLANT
SUT NOVA NAB DX-16 0-1 5-0 T12 (SUTURE) IMPLANT
SUT PDS AB 1 TP1 96 (SUTURE) ×4 IMPLANT
SUT SILK 2 0 SH CR/8 (SUTURE) ×2 IMPLANT
SUT SILK 2 0 TIES 10X30 (SUTURE) ×2 IMPLANT
SUT SILK 3 0 SH CR/8 (SUTURE) ×2 IMPLANT
SUT SILK 3 0 TIES 10X30 (SUTURE) ×2 IMPLANT
SUT VIC AB 3-0 SH 18 (SUTURE) IMPLANT
SUT VIC AB 3-0 SH 27 (SUTURE)
SUT VIC AB 3-0 SH 27XBRD (SUTURE) ×4 IMPLANT
SUT VIC AB 3-0 SH 8-18 (SUTURE) IMPLANT
SYR 30ML LL (SYRINGE) IMPLANT
TAPE CLOTH SURG 6X10 WHT LF (GAUZE/BANDAGES/DRESSINGS) IMPLANT
TOWEL GREEN STERILE (TOWEL DISPOSABLE) ×2 IMPLANT
TRAY FOLEY MTR SLVR 16FR STAT (SET/KITS/TRAYS/PACK) IMPLANT
TUBE JEJUNAL 16FR ENFIT (TUBING) ×2 IMPLANT
WATER STERILE IRR 1000ML POUR (IV SOLUTION) IMPLANT
YANKAUER SUCT BULB TIP NO VENT (SUCTIONS) IMPLANT

## 2023-02-01 NOTE — Anesthesia Postprocedure Evaluation (Signed)
Anesthesia Post Note  Patient: Bradley Dominguez  Procedure(s) Performed: RE-OPENING  OF LAPAROTOMY, ABDOMINAL WASHOUT, ABDOMINAL CLOSURE, ILEOSTOMY PLACEMENT (Abdomen) INSERTION OF JEJUNOSTOMY TUBE (Abdomen)     Patient location during evaluation: SICU Anesthesia Type: General Level of consciousness: sedated Pain management: pain level controlled Vital Signs Assessment: post-procedure vital signs reviewed and stable Respiratory status: patient remains intubated per anesthesia plan Cardiovascular status: stable Postop Assessment: no apparent nausea or vomiting Anesthetic complications: no   No notable events documented.  Last Vitals:  Vitals:   02/01/23 0600 02/01/23 0700  BP: 99/69 102/67  Pulse: (!) 54 (!) 54  Resp: (!) 24 (!) 24  Temp: 37 C 37.1 C  SpO2: 100% 94%    Last Pain:  Vitals:   02/01/23 0700  TempSrc: Esophageal  PainSc:                  Earl Lites P Orvan Papadakis

## 2023-02-01 NOTE — Transfer of Care (Signed)
Immediate Anesthesia Transfer of Care Note  Patient: Bradley Dominguez  Procedure(s) Performed: RE-OPENING  OF LAPAROTOMY, ABDOMINAL WASHOUT, ABDOMINAL CLOSURE, ILEOSTOMY PLACEMENT (Abdomen) INSERTION OF JEJUNOSTOMY TUBE (Abdomen)  Patient Location: ICU  Anesthesia Type:General  Level of Consciousness: sedated and Patient remains intubated per anesthesia plan  Airway & Oxygen Therapy: Patient remains intubated per anesthesia plan and Patient placed on Ventilator (see vital sign flow sheet for setting)  Post-op Assessment: Report given to RN and Post -op Vital signs reviewed and stable  Post vital signs: Reviewed and stable  Last Vitals:  Vitals Value Taken Time  BP 107/63 02/01/23 0934  Temp 36.5 C 02/01/23 0941  Pulse 72 02/01/23 0941  Resp 24 02/01/23 0941  SpO2 95 % 02/01/23 0941  Vitals shown include unvalidated device data.  Last Pain:  Vitals:   02/01/23 0700  TempSrc: Esophageal  PainSc:          Complications: No notable events documented.

## 2023-02-01 NOTE — Progress Notes (Signed)
Day of Surgery  Subjective: Given albumin and FFP yesterday. Down to levo at 2 and vaso. Adequate UOP, CMP pending. Hgb stable.   Objective: Vital signs in last 24 hours: Temp:  [98.4 F (36.9 C)-99.3 F (37.4 C)] 98.6 F (37 C) (04/27 0600) Pulse Rate:  [54-78] 54 (04/27 0600) Resp:  [24] 24 (04/27 0600) BP: (92-132)/(56-73) 99/69 (04/27 0600) SpO2:  [87 %-100 %] 100 % (04/27 0600) Arterial Line BP: (94-150)/(46-66) 114/53 (04/27 0600) FiO2 (%):  [40 %] 40 % (04/27 0340) Weight:  [101.8 kg] 101.8 kg (04/26 0700) Last BM Date : 01/30/23 (flexiseal)  Intake/Output from previous day: 04/26 0701 - 04/27 0700 In: 5601.3 [I.V.:4717.7; IV Piggyback:883.6] Out: 2108 [Urine:1150; Drains:958] Intake/Output this shift: Total I/O In: 2624.8 [I.V.:2539.5; IV Piggyback:85.3] Out: 920 [Urine:525; Drains:395]  PE: General: NAD Neuro: sedated, opens eyes to verbal stimuli Resp: ETT in place, on vent Vent Mode: PRVC FiO2 (%):  [40 %] 40 % Set Rate:  [24 bmp] 24 bmp Vt Set:  [630 mL] 630 mL PEEP:  [5 cmH20] 5 cmH20 Plateau Pressure:  [19 cmH20-20 cmH20] 19 cmH20 CV: RRR Abdomen: soft, nondistended. Abthera with drainage of thin serosanguinous fluid. JP with serous drainage. RUQ: T tube with bilious fluid, duodenostomy to gravity, draining succus. Extremities: edematous GU: Foley draining yellow urine   Lab Results:  Recent Labs    01/31/23 2203 02/01/23 0558  WBC 7.2 8.1  HGB 11.6* 11.8*  HCT 36.1* 37.1*  PLT 27* 28*   BMET Recent Labs    01/30/23 1747 01/31/23 0422 01/31/23 0619  NA 141 145 145  K 3.9 4.0 4.0  CL 106  --  109  CO2 21*  --  23  GLUCOSE 206*  --  122*  BUN 44*  --  57*  CREATININE 1.45*  --  1.55*  CALCIUM 6.8*  --  7.5*   PT/INR Recent Labs    01/29/23 0956 02/01/23 0558  LABPROT 17.8* 17.7*  INR 1.5* 1.5*   CMP     Component Value Date/Time   NA 145 01/31/2023 0619   K 4.0 01/31/2023 0619   CL 109 01/31/2023 0619   CO2 23  01/31/2023 0619   GLUCOSE 122 (H) 01/31/2023 0619   BUN 57 (H) 01/31/2023 0619   CREATININE 1.55 (H) 01/31/2023 0619   CALCIUM 7.5 (L) 01/31/2023 0619   PROT 3.9 (L) 01/31/2023 0619   ALBUMIN 2.1 (L) 01/31/2023 0619   AST 801 (H) 01/31/2023 0619   ALT 920 (H) 01/31/2023 0619   ALKPHOS 45 01/31/2023 0619   BILITOT 4.7 (H) 01/31/2023 0619   GFRNONAA 49 (L) 01/31/2023 0619   GFRAA >60 09/23/2017 0507   Lipase  No results found for: "LIPASE"     Studies/Results: No results found.     Assessment/Plan 67 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 4/22 - Ex lap, adhesiolysis, duodenojejunal bypass, duodenostomy tube placement, T tube placement in common bile duct. 4/23 - Takeback for bleeding with hemorrhagic shock, resection of ischemic transverse colon, left in discontinuity with Abthera 4/24 - Takeback for bleeding, abdominal washout, ileocecectomy, pack placement, placement of Abthera 4/25 - Takeback, resection of 10cm necrotic distal ileum, washout, removal of packs, placement of Abthera  - Continue TPN at full strength - Elevated LFTs: Likely secondary to shock liver and massive transfusion of PRBCs. Continue to trend. - Milk AKI: Continue to monitor, give volume as needed. UOP has remained adequate. - Keep T tube and D tube to gravity drainage. -  Pain/sedation: Precedex and fentanyl - Full vent support - Thrombocytepenia: Transfuse 1u platelets this morning for OR - ID: Zosyn for bowel ischemia - FEN: Strict NPO, TPN - VTE: SCDs, chemical DVT ppx on hold in setting of recent bleeding and coagulopathy. - Dispo: ICU. Proceed to OR today for end ileostomy, feeding tube placement, and abdominal closure. I reviewed this plan with the patient's wife at bedside.    LOS: 19 days    Sophronia Simas, MD [facility] General, Hepatobiliary and Pancreatic Surgery 02/01/23 6:59 AM

## 2023-02-01 NOTE — Op Note (Signed)
Date: 02/01/23  Patient: Bradley Dominguez MRN: [id number]  Preoperative Diagnosis: Open abdomen Postoperative Diagnosis: Same  Procedure: Exploratory laparotomy and abdominal washout Placement of feeding jejunostomy tube End ileostomy creation Closure of abdomen  Surgeon: Sophronia Simas, MD  EBL: 100 mL  Anesthesia: General endotracheal  Specimens: None  Indications: Bradley Dominguez is a 67 yo male who underwent abdominal exploration and duodenojejunal bypass 5 days ago for duodenal obstruction. His course has been complicated by multiple bleeding events with hemorrhagic shock, requiring re-laparotomy, as well as development of colonic ischemia. His abdomen remains open and his shock and coagulopathy have resolved. He was brought to the operating room today for ileostomy and attempted abdominal closure.  Findings: No ischemia of small bowel or remaining colon. 210cm of small bowel in the common channel, distal to both JJ anastomoses. A 16-Fr feeding jejunostomy tube was placed in the proximal common channel. End ileostomy placed in the left mid-abdomen. Peak airway pressure after closure of the fascia was .  Procedure details: Informed consent was obtained from the patient's family prior to the procedure. The patient was brought to the operating room from the ICU and placed on the table in the supine position. General anesthesia was induced. The previous Abthera was removed and the abdomen was prepped and draped in the usual sterile fashion. A pre-procedure timeout was taken verifying patient identity, surgical site and procedure to be performed.  The small bowel was eviscerated and the small bowel loops gently separated. The entire small bowel was healthy and well-perfused. The duodenostomy tube and T tube were not manipulated at all. The abdomen was irrigated with warmed saline and appeared hemostatic. The new jejunojejunal anastomosis was identified, which was distal to the patient's  previous JJ anastomosis created at his original surgeries in the 1970s. The common channel was measured at a length of 210cm from the distal JJ anastomosis to the end of the ileum. As the patient had sufficient small bowel to minimize the risk of short gut syndrome, so the decision was made to place a feeding jejunostomy tube. A 16-Fr J tube was brought onto the field and passed through the right mid abdominal wall, as this was where the proximal small bowel was able to easily reach the abdominal wall. A pursestring suture was placed on the antimesenteric border of the jejunum, about 5cm distal to the distal JJ anastomosis. An enterotomy was created in the center of the pursestring, the J tube was cut to size, and the end of the tube was fed into the jejunum and milked distally. The pursestring was tied and a very short Witzel tunnel was created with a 2-0 silk suture. The jejunum was then pexied to the abdominal wall around the tube using 2-0 silk sutures. The bumper was cinched down to the skin and the balloon was inflated with 1mL sterile saline. Next the end of the ileum was brought to the left mid-abdominal wall. It reached the abdominal wall easily with no tension and no twisting of the mesentery. A circular skin incision was made overlying the rectus muscle, and the subcutaneous tissue was divided with cautery. The anterior rectus sheath was incised vertically, the rectus muscle was spread, and the posterior sheath was incised vertically with cautery. The end of the ileum was brought through this defect to the skin, taking care not to twist the mesentery. The mesentery was oriented superiorly. The fascia was closed at midline with interrupted 1 Novafil figure-of-eight sutures. The skin was left open. The ileostomy was  matured in [place] fashion using 3-0 Vicryl sutures. An ostomy appliance was placed, and the midline incision was dressed with clean gauze.  The patient tolerated the procedure well with no  apparent complications. All counts were correct x2 at the end of the procedure. The patient remained intubated and was transported back to the ICU for further care.  Sophronia Simas, MD 02/01/23 9:33 AM   GI Tract Anatomy:

## 2023-02-01 NOTE — Progress Notes (Signed)
Pt off the unit at this time and in OR. RT to perform routine vent check when pt arrives back to unit.

## 2023-02-01 NOTE — Progress Notes (Signed)
PHARMACY - TOTAL PARENTERAL NUTRITION CONSULT NOTE  Indication: Duodenal obstruction with ileus  Patient Measurements: Height: 6\' 1"  (185.4 cm) Weight: 101.8 kg (224 lb 6.9 oz) IBW/kg (Calculated) : 79.9 TPN AdjBW (KG): 65.5 Body mass index is 29.61 kg/m.  Assessment:  69 yoM admitted on 4/8 with N/V, chronic partial SBO, history of multiple previous abdominal surgeries. Pt has very minimal PO intake with ongoing weight loss.  Per documentation, patient has severe PCM.  Pharmacy consulted to manage TPN.   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 Placed biliary T-tube, duodenostomy tube - 4/22 2nd OR: ex lap with transverse colectomy, abd packed & left open w/ Abthera dressing - 4/24 OR: acute decompensation >> ex-lap with ileocectomy, temporary abd closure with neg pressure dressing.  MTP. - 4/25: ex-lap with washout, distal ileum resection, temp abd closure  Central access: double lumen PICC placed 01/13/23 TPN start date: 01/14/23  Nutritional Goals: RD Estimated Needs Total Energy Estimated Needs: 2050-2250 Total Protein Estimated Needs: 130-145 grams Total Fluid Estimated Needs: 2.1L/day  Current Nutrition:   TPN  Plan:  Adjust TPN to meet new estimated needs  TPN at 95 ml/hr will provide 132g AA and 2075  kCal, meeting 100% of needs Electrolytes in TPN: Na 31mEq/L, K 25 mEq/L (= 57 mEq/day, no increase with risk for worsening renal fxn, Ca 10 mEq/L, Mg 9 mEq/L, Phos 47mmol/L, Cl:Ac 2:1 Add standard MVI and trace elements to [id number] Reduce sensitive SSI to Q6H Monitor TPN labs on Mon/Thurs - CMP, Mag, Phos in AM  Monitor for jaundice Going to OR 4/27  Jeanella Cara, PharmD, [facility] Clinical Pharmacist Please see AMION for all Pharmacists' Contact Phone Numbers 02/01/2023, 7:24 AM

## 2023-02-01 NOTE — Progress Notes (Signed)
Pt arrived back to unit from OR. Upon placing pt on vent SpO2 remained low 89-90% on 40% FiO2. FiO2 increased to 60% with minimal to no improvement. Recruitment maneuver performed x1 for 2 minutes. Pt tolerated well and SpO2 increased to 97%. RT will continue to monitor and be available as needed.

## 2023-02-02 ENCOUNTER — Inpatient Hospital Stay (HOSPITAL_COMMUNITY): Payer: BC Managed Care – PPO

## 2023-02-02 ENCOUNTER — Encounter (HOSPITAL_COMMUNITY): Payer: Self-pay | Admitting: Surgery

## 2023-02-02 LAB — MAGNESIUM: Magnesium: 2.2 mg/dL (ref 1.7–2.4)

## 2023-02-02 LAB — COMPREHENSIVE METABOLIC PANEL
ALT: 608 U/L — ABNORMAL HIGH (ref 0–44)
AST: 241 U/L — ABNORMAL HIGH (ref 15–41)
Albumin: 1.7 g/dL — ABNORMAL LOW (ref 3.5–5.0)
Alkaline Phosphatase: 63 U/L (ref 38–126)
Anion gap: 7 (ref 5–15)
BUN: 76 mg/dL — ABNORMAL HIGH (ref 8–23)
CO2: 24 mmol/L (ref 22–32)
Calcium: 7.6 mg/dL — ABNORMAL LOW (ref 8.9–10.3)
Chloride: 116 mmol/L — ABNORMAL HIGH (ref 98–111)
Creatinine, Ser: 1.17 mg/dL (ref 0.61–1.24)
GFR, Estimated: >60 mL/min (ref >60)
Glucose, Bld: 116 mg/dL — ABNORMAL HIGH (ref 70–99)
Potassium: 3.7 mmol/L (ref 3.5–5.1)
Sodium: 147 mmol/L — ABNORMAL HIGH (ref 135–145)
Total Bilirubin: 9 mg/dL — ABNORMAL HIGH (ref 0.3–1.2)
Total Protein: 3.8 g/dL — ABNORMAL LOW (ref 6.5–8.1)

## 2023-02-02 LAB — BPAM PLATELET PHERESIS
Blood Product Expiration Date: 202404282359
Unit Type and Rh: 6200
Unit Type and Rh: 8400

## 2023-02-02 LAB — CBC
HCT: 36.9 % — ABNORMAL LOW (ref 39.0–52.0)
Hemoglobin: 11.7 g/dL — ABNORMAL LOW (ref 13.0–17.0)
MCH: 29.6 pg (ref 26.0–34.0)
MCHC: 31.7 g/dL (ref 30.0–36.0)
MCV: 93.4 fL (ref 80.0–100.0)
Platelets: 44 10*3/uL — ABNORMAL LOW (ref 150–400)
RBC: 3.95 MIL/uL — ABNORMAL LOW (ref 4.22–5.81)
RDW: 18.3 % — ABNORMAL HIGH (ref 11.5–15.5)
WBC: 11.5 10*3/uL — ABNORMAL HIGH (ref 4.0–10.5)
nRBC: 0.3 % — ABNORMAL HIGH (ref 0.0–0.2)

## 2023-02-02 LAB — POCT I-STAT 7, (LYTES, BLD GAS, ICA,H+H)
Acid-Base Excess: 0 mmol/L (ref 0.0–2.0)
Bicarbonate: 25.4 mmol/L (ref 20.0–28.0)
Calcium, Ion: 1.17 mmol/L (ref 1.15–1.40)
HCT: 33 % — ABNORMAL LOW (ref 39.0–52.0)
Hemoglobin: 11.2 g/dL — ABNORMAL LOW (ref 13.0–17.0)
O2 Saturation: 99 %
Patient temperature: 37
Potassium: 3.7 mmol/L (ref 3.5–5.1)
Sodium: 149 mmol/L — ABNORMAL HIGH (ref 135–145)
TCO2: 27 mmol/L (ref 22–32)
pCO2 arterial: 44.2 mmHg (ref 32–48)
pH, Arterial: 7.368 (ref 7.35–7.45)
pO2, Arterial: 136 mmHg — ABNORMAL HIGH (ref 83–108)

## 2023-02-02 LAB — PREPARE PLATELET PHERESIS: Unit division: 0

## 2023-02-02 LAB — GLUCOSE, CAPILLARY
Glucose-Capillary: 107 mg/dL — ABNORMAL HIGH (ref 70–99)
Glucose-Capillary: 109 mg/dL — ABNORMAL HIGH (ref 70–99)
Glucose-Capillary: 78 mg/dL (ref 70–99)
Glucose-Capillary: 85 mg/dL (ref 70–99)
Glucose-Capillary: 85 mg/dL (ref 70–99)

## 2023-02-02 LAB — PHOSPHORUS: Phosphorus: 3.8 mg/dL (ref 2.5–4.6)

## 2023-02-02 MED ORDER — HYDRALAZINE HCL 20 MG/ML IJ SOLN: 5.0000 mg | Freq: Four times a day (QID) | INTRAMUSCULAR | Status: DC | PRN

## 2023-02-02 MED ORDER — METHOCARBAMOL 1000 MG/10ML IJ SOLN
1000.0000 mg | Freq: Three times a day (TID) | INTRAVENOUS | Status: DC
  Administered 2023-02-02 – 2023-02-05 (×10): 1000 mg via INTRAVENOUS
  Filled 2023-02-02 (×2): qty 10
  Filled 2023-02-02: qty 1000
  Filled 2023-02-02 (×2): qty 10
  Filled 2023-02-02 (×2): qty 1000
  Filled 2023-02-02: qty 10
  Filled 2023-02-02 (×2): qty 1000
  Filled 2023-02-02: qty 10
  Filled 2023-02-02 (×2): qty 1000

## 2023-02-02 MED ORDER — POTASSIUM CHLORIDE 10 MEQ/50ML IV SOLN
10.0000 meq | INTRAVENOUS | Status: AC
  Administered 2023-02-02 (×2): 10 meq via INTRAVENOUS
  Filled 2023-02-02 (×2): qty 50

## 2023-02-02 MED ORDER — FUROSEMIDE 10 MG/ML IJ SOLN
40.0000 mg | Freq: Once | INTRAMUSCULAR | Status: AC
  Administered 2023-02-02: 40 mg via INTRAVENOUS
  Filled 2023-02-02: qty 4

## 2023-02-02 MED ORDER — OXYCODONE HCL 5 MG/5ML PO SOLN
10.0000 mg | Freq: Four times a day (QID) | ORAL | Status: DC
  Administered 2023-02-02 – 2023-02-03 (×4): 10 mg
  Filled 2023-02-02 (×4): qty 10

## 2023-02-02 MED ORDER — ZINC CHLORIDE 1 MG/ML IV SOLN
INTRAVENOUS | Status: AC
  Filled 2023-02-02: qty 1322.4

## 2023-02-02 NOTE — Progress Notes (Signed)
PHARMACY - TOTAL PARENTERAL NUTRITION CONSULT NOTE  Indication: Duodenal obstruction with ileus  Patient Measurements: Height: 6\' 1"  (185.4 cm) Weight: 100.1 kg (220 lb 10.9 oz) IBW/kg (Calculated) : 79.9 TPN AdjBW (KG): 65.5 Body mass index is 29.12 kg/m.  Assessment:  62 yoM admitted on 4/8 with N/V, chronic partial SBO, history of multiple previous abdominal surgeries. Pt has very minimal PO intake with ongoing weight loss.  Per documentation, patient has severe PCM.  Pharmacy consulted to manage TPN.   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 Placed biliary T-tube, duodenostomy tube - 4/22 2nd OR: ex lap with transverse colectomy, abd packed & left open w/ Abthera dressing - 4/24 OR: acute decompensation >> ex-lap with ileocectomy, temporary abd closure with neg pressure dressing.  MTP. - 4/25: ex-lap with washout, distal ileum resection, temp abd closure - 4/27: RE-OPENING  OF LAPAROTOMY, ABDOMINAL WASHOUT, ABDOMINAL CLOSURE, ILEOSTOMY PLACEMENT (Abdomen) INSERTION OF JEJUNOSTOMY TUBE (Abdomen)  Central access: double lumen PICC placed 01/13/23 TPN start date: 01/14/23  Nutritional Goals: RD Estimated Needs Total Energy Estimated Needs: 2050-2250 Total Protein Estimated Needs: 130-145 grams Total Fluid Estimated Needs:  2.1L/day  Current Nutrition:   TPN  Plan:  Adjust TPN to meet new estimated needs  TPN at 95 ml/hr will provide 132g AA and 2075 kCal, meeting 100% of needs Electrolytes in TPN: Na 42mEq/L, Increase K 30 mEq/L, Ca 10 mEq/L, Mg 9 mEq/L, Phos 5mmol/L, Cl:Ac 2:1 Add standard MVI and trace elements to [id number] Reduce sensitive SSI to Q6H Monitor TPN labs on Mon/Thurs Monitor for jaundice  Jeanella Cara, PharmD, [facility] Clinical Pharmacist Please see AMION for all Pharmacists' Contact Phone Numbers 02/02/2023, 8:03 AM

## 2023-02-02 NOTE — Progress Notes (Signed)
1 Day Post-Op  Subjective: Platelets stable. Vaso turned off this morning, now off all pressors. Creatinine downtrending. Transaminases downtrending, bili up to 9.    Objective: Vital signs in last 24 hours: Temp:  [97.7 F (36.5 C)-99.3 F (37.4 C)] 98.4 F (36.9 C) (04/28 0700) Pulse Rate:  [58-93] 87 (04/28 0700) Resp:  [21-24] 24 (04/28 0700) BP: (86-116)/(49-69) 106/67 (04/28 0700) SpO2:  [91 %-100 %] 94 % (04/28 0700) Arterial Line BP: (101-181)/(51-81) 146/62 (04/28 0700) FiO2 (%):  [40 %-60 %] 60 % (04/28 0449) Weight:  [100.1 kg] 100.1 kg (04/28 0443) Last BM Date : 01/30/23 (flexiseal)  Intake/Output from previous day: 04/27 0701 - 04/28 0700 In: 4939.4 [I.V.:4451.7; Blood:308; IV Piggyback:149.7] Out: 2764 [Urine:1550; Drains:1164; Blood:50] Intake/Output this shift: No intake/output data recorded.  PE: General: NAD Neuro: sedated, opens eyes to verbal stimuli and follows commands Resp: ETT in place, on vent Vent Mode: PRVC FiO2 (%):  [40 %-60 %] 60 % Set Rate:  [24 bmp] 24 bmp Vt Set:  [630 mL] 630 mL PEEP:  [5 cmH20-6 cmH20] 5 cmH20 Plateau Pressure:  [18 cmH20-22 cmH20] 20 cmH20 CV: RRR Abdomen: soft, nondistended. Midline incision open at skin, clean and dry. JP with serosanguinous drainage. RUQ: T tube with bilious fluid, duodenostomy to gravity draining succus, J tube capped. Extremities: edematous GU: Foley draining yellow urine   Lab Results:  Recent Labs    02/01/23 1312 02/02/23 0445 02/02/23 0457  WBC 9.3 11.5*  --   HGB 11.5* 11.7* 11.2*  HCT 36.4* 36.9* 33.0*  PLT 46* 44*  --    BMET Recent Labs    02/01/23 0558 02/01/23 0911 02/02/23 0445 02/02/23 0457  NA 143   < > 147* 149*  K 3.7   < > 3.7 3.7  CL 113*  --  116*  --   CO2 23  --  24  --   GLUCOSE 128*  --  116*  --   BUN 72*  --  76*  --   CREATININE 1.38*  --  1.17  --   CALCIUM 7.5*  --  7.6*  --    < > = values in this interval not displayed.   PT/INR Recent  Labs    02/01/23 0558  LABPROT 17.7*  INR 1.5*   CMP     Component Value Date/Time   NA 149 (H) 02/02/2023 0457   K 3.7 02/02/2023 0457   CL 116 (H) 02/02/2023 0445   CO2 24 02/02/2023 0445   GLUCOSE 116 (H) 02/02/2023 0445   BUN 76 (H) 02/02/2023 0445   CREATININE 1.17 02/02/2023 0445   CALCIUM 7.6 (L) 02/02/2023 0445   PROT 3.8 (L) 02/02/2023 0445   ALBUMIN 1.7 (L) 02/02/2023 0445   AST 241 (H) 02/02/2023 0445   ALT 608 (H) 02/02/2023 0445   ALKPHOS 63 02/02/2023 0445   BILITOT 9.0 (H) 02/02/2023 0445   GFRNONAA >60 02/02/2023 0445   GFRAA >60 09/23/2017 0507   Lipase  No results found for: "LIPASE"     Studies/Results: No results found.     Assessment/Plan 67 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 4/22 - Ex lap, adhesiolysis, duodenojejunal bypass, duodenostomy tube placement, T tube placement in common bile duct. 4/23 - Takeback for bleeding with hemorrhagic shock, resection of ischemic transverse colon, left in discontinuity with Abthera 4/24 - Takeback for bleeding, abdominal washout, ileocecectomy, pack placement, placement of Abthera 4/25 - Takeback, resection of 10cm necrotic distal ileum, washout, removal  of packs, placement of Abthera 4/27 - Takeback, placement of J tube, end ileostomy, abdominal closure  - Continue TPN at full strength - Elevated LFTs: Likely secondary to shock liver and massive transfusion of PRBCs. T tube is working, alk phos remains normal. Anticipate bilirubin will downtrend. If continued rise this will, will plan for tube cholangiogram to rule out obstruction. - Milk AKI: Resolving, good UOP. - Keep T tube and D tube to gravity drainage. - Pain/sedation: Fentanyl gtt while intubated, transition to dilaudid PCA after extubation - Vent: PSV trial this morning, attempt extubation - ID: Zosyn for bowel ischemia and known duodenal leak - FEN: Strict NPO, TPN - VTE: SCDs, chemical DVT ppx on hold in setting of thrombocytopenia. - Dispo:  ICU  I discussed the plan of care with the patient's wife and niece at bedside, and the patient's night shift nurse.   LOS: 20 days    Sophronia Simas, MD [facility] Surgery General, Hepatobiliary and Pancreatic Surgery 02/02/23 7:25 AM

## 2023-02-02 NOTE — Progress Notes (Signed)
Patient ID: Bradley Dominguez, male   DOB: [birth year], 67 y.o.   MRN: [id number] Follow up - Trauma Critical Care   Patient Details:    Bradley Dominguez is an 67 y.o. male.  Lines/tubes : Airway 7.5 mm (Active)  Secured at (cm) 22 cm 02/02/23 0741  Measured From Lips 02/02/23 0741  Secured Location Right 02/02/23 0741  Secured By [employer] 02/02/23 0741  Tube Holder Repositioned Yes 02/02/23 0741  Prone position No 02/02/23 0741  Cuff Pressure (cm H2O) Clear OR 27-39 CmH2O 02/02/23 0741  Site Condition Dry 02/02/23 0741     PICC Triple Lumen 01/23/23 Right Basilic 39 cm 0 cm (Active)  Indication for Insertion or Continuance of Line Vasoactive infusions 02/01/23 2100  Exposed Catheter (cm) 0 cm 01/23/23 1840  Site Assessment Clean, Dry, Intact 02/01/23 2100  Lumen #1 Status Infusing 02/01/23 2100  Lumen #2 Status Infusing 02/01/23 2100  Lumen #3 Status Saline locked;Flushed 02/01/23 2100  Dressing Type Transparent 02/01/23 2100  Dressing Status Antimicrobial disc in place;Clean, Dry, Intact 02/01/23 2100  Safety Lock Intact 01/31/23 0800  Line Care Lumen 3 tubing changed;Lumen 3 cap changed;Connections checked and tightened 02/01/23 1759  Line Adjustment (NICU/IV Team Only) No 01/24/23 2000  Dressing Intervention Dressing changed;Antimicrobial disc changed;Securement device changed 01/30/23 1739  Dressing Change Due 02/06/23 02/01/23 2100     CVC Single Lumen 01/27/23 Left Internal jugular (Active)  Indication for Insertion or Continuance of Line Vasoactive infusions 02/01/23 2100  Site Assessment Clean, Dry, Intact 02/01/23 2100  Line Status Flushed;Saline locked 02/01/23 2100  Dressing Type Transparent 02/01/23 2100  Dressing Status Antimicrobial disc in place 02/01/23 2100  Line Care Connections checked and tightened 01/29/23 1100  Dressing Intervention New dressing 01/28/23 0800  Dressing Change Due 02/04/23 02/01/23 2100     CVC Triple Lumen 01/28/23 Right  Internal jugular (Active)  Indication for Insertion or Continuance of Line Vasoactive infusions 02/01/23 2100  Site Assessment Clean, Dry, Intact 02/01/23 2100  Proximal Lumen Status Infusing 02/01/23 2100  Medial Lumen Status Infusing 02/01/23 2100  Distal Lumen Status Infusing 02/01/23 2100  Dressing Type Transparent 02/01/23 2100  Dressing Status Antimicrobial disc in place 02/01/23 2100  Line Care Proximal tubing changed;Proximal cap changed;Connections checked and tightened 01/30/23 1739  Dressing Intervention New dressing;Dressing changed;Antimicrobial disc changed 01/29/23 1100  Dressing Change Due 02/05/23 02/01/23 2100     Arterial Line 01/27/23 Right Radial (Active)  Site Assessment Clean, Dry, Intact 02/01/23 2100  Line Status Pulsatile blood flow 02/01/23 2100  Art Line Waveform Appropriate 02/01/23 2100  Art Line Interventions Connections checked and tightened 01/31/23 2000  Color/Movement/Sensation Capillary refill less than 3 sec 01/31/23 2000  Dressing Type Transparent 02/01/23 2100  Dressing Status Clean, Dry, Intact 02/01/23 2100  Interventions Dressing changed 01/31/23 1000  Dressing Change Due 02/04/23 02/01/23 2100     Closed System Drain 1 Left Abdomen Bulb (JP) 19 Fr. (Active)  Site Description Unremarkable 02/01/23 2000  Dressing Status Clean, Dry, Intact 02/01/23 2000  Drainage Appearance Bloody 02/01/23 2000  Status To suction (Charged) 02/01/23 2000  Output (mL) 10 mL 02/02/23 0500     Biliary Tube T-tube 10 Fr. RUQ (Active)  Site Description Unremarkable 02/01/23 2000  Dressing Status None 02/01/23 2000  Drainage Color Green 02/01/23 2000  Tube Status/Interventions Open to gravity drainage 02/01/23 2000  Output (mL) 40 mL 02/02/23 0500     NG/OG Vented/Dual Lumen Right nare (Active)  Tube Position (Required) Marking at nare/corner of mouth 02/01/23  2000  Measurement (cm) (Required) 73 cm 02/01/23 1000  Ongoing Placement Verification (Required) (See  row information) Yes 01/31/23 2000  Site Assessment Clean, Dry, Intact 02/01/23 2000  Interventions Other (Comment) 02/01/23 2000  Status Low intermittent suction 02/01/23 2000  Amount of suction 70 mmHg 01/29/23 2000  Drainage Appearance Dark red 01/29/23 2000  Intake (mL) 50 mL 01/24/23 1800  Output (mL) 0 mL 02/02/23 0500     Gastrostomy/Enterostomy Other (Comment) 24 Fr. RUQ (Active)  Surrounding Skin Intact 02/01/23 2000  Tube Status/Interventions Open to gravity drainage 02/01/23 2000  Drainage Appearance Serous 02/01/23 2000  Dressing Status None 02/01/23 2000  Dressing Type Split gauze 02/01/23 2000  Output (mL) 150 mL 02/02/23 0500     Gastrostomy/Enterostomy Jejunostomy 16 Fr. RLQ (Active)  Surrounding Skin Intact 02/01/23 2000  Tube Status/Interventions Clamped 02/01/23 2000  Dressing Status None 02/01/23 2000  G Port Intake (mL) 30 ml 02/01/23 1800     Ileostomy Standard (end) LLQ (Active)  Ostomy Pouch 1 piece 02/01/23 2000  Stoma Assessment Pink 02/01/23 2000  Peristomal Assessment Intact 02/01/23 2000  Output (mL) 0 mL 02/02/23 0500     Urethral Catheter Badi Ali RN Latex 16 Fr. (Active)  Indication for Insertion or Continuance of Catheter Acute urinary retention (I&O Cath for 24 hrs prior to catheter insertion- Inpatient Only) 02/02/23 0800  Site Assessment Clean, Dry, Intact 02/02/23 0800  Catheter Maintenance Bag below level of bladder;Catheter secured;Drainage bag/tubing not touching floor;Insertion date on drainage bag;No dependent loops;Seal intact;Bag emptied prior to transport 02/02/23 0800  Collection Container Standard drainage bag 02/02/23 0800  Securement Method Securing device (Describe) 02/02/23 0800  Urinary Catheter Interventions (if applicable) Unclamped 02/01/23 2000  Output (mL) 175 mL 02/02/23 0500    Microbiology/Sepsis markers: Results for orders placed or performed during the hospital encounter of 01/13/23  MRSA Next Gen by PCR, Nasal      Status: None   Collection Time: 01/28/23 12:12 PM   Specimen: Nasal Mucosa; Nasal Swab  Result Value Ref Range Status   MRSA by PCR Next Gen NOT DETECTED NOT DETECTED Final    Comment: (NOTE) The GeneXpert MRSA Assay (FDA approved for NASAL specimens only), is one component of a comprehensive MRSA colonization surveillance program. It is not intended to diagnose MRSA infection nor to guide or monitor treatment for MRSA infections. Test performance is not FDA approved in patients less than 30 years old. Performed at [facility], [address] [place], [place] [address]   Urine Culture (for pregnant, neutropenic or urologic patients or patients with an indwelling urinary catheter)     Status: None   Collection Time: 01/29/23  2:03 PM   Specimen: Urine, Catheterized  Result Value Ref Range Status   Specimen Description URINE, CATHETERIZED  Final   Special Requests NONE  Final   Culture   Final    NO GROWTH Performed at [facility], [address] [place], [place] [address]    Report Status 01/30/2023 FINAL  Final  Surgical PCR screen     Status: Abnormal   Collection Time: 02/01/23  5:13 AM   Specimen: Nasal Mucosa; Nasal Swab  Result Value Ref Range Status   MRSA, PCR NEGATIVE NEGATIVE Final   Staphylococcus aureus POSITIVE (A) NEGATIVE Final    Comment: (NOTE) The Xpert SA Assay (FDA approved for NASAL specimens in patients 59 years of age and older), is one component of a comprehensive surveillance program. It is not intended to  diagnose infection nor to guide or monitor treatment. Performed at [facility], [address], [place], [place] [address]     Anti-infectives:  Anti-infectives (From admission, onward)    Start     Dose/Rate Route Frequency Ordered Stop   01/28/23 1000  piperacillin-tazobactam (ZOSYN) IVPB 3.375 g        3.375 g 12.5 mL/hr over 240 Minutes Intravenous Every 8 hours 01/28/23 0902     01/27/23 1030  ceFAZolin (ANCEF) IVPB  2g/100 mL premix       See Hyperspace for full Linked Orders Report.   2 g 200 mL/hr over 30 Minutes Intravenous On call to O.R. 01/27/23 0746 01/27/23 1135   01/27/23 1030  metroNIDAZOLE (FLAGYL) IVPB 500 mg       See Hyperspace for full Linked Orders Report.   500 mg 100 mL/hr over 60 Minutes Intravenous On call to O.R. 01/27/23 0746 01/27/23 1141   01/19/23 0600  cefTRIAXone (ROCEPHIN) 2 g in sodium chloride 0.9 % 100 mL IVPB       See Hyperspace for full Linked Orders Report.   2 g 200 mL/hr over 30 Minutes Intravenous On call to O.R. 01/19/23 0350 01/19/23 0606   01/19/23 0350  metroNIDAZOLE (FLAGYL) IVPB 500 mg  Status:  Discontinued       See Hyperspace for full Linked Orders Report.   500 mg 100 mL/hr over 60 Minutes Intravenous Every 8 hours 01/19/23 0350 01/19/23 0752     Consults:     Studies:    Events:  Subjective:    Overnight Issues: more stable, did not wean well this AM - HTN  Objective:  Vital signs for last 24 hours: Temp:  [97.7 F (36.5 C)-99.3 F (37.4 C)] 98.6 F (37 C) (04/28 0645) Pulse Rate:  [58-93] 82 (04/28 0741) Resp:  [21-25] 25 (04/28 0741) BP: (86-116)/(49-69) 106/67 (04/28 0741) SpO2:  [91 %-100 %] 94 % (04/28 0741) Arterial Line BP: (101-181)/(51-81) 146/62 (04/28 0700) FiO2 (%):  [40 %-60 %] 40 % (04/28 0741) Weight:  [100.1 kg] 100.1 kg (04/28 0443)  Hemodynamic parameters for last 24 hours:    Intake/Output from previous day: 04/27 0701 - 04/28 0700 In: 4939.4 [I.V.:4451.7; Blood:308; IV Piggyback:149.7] Out: 2764 [Urine:1550; Drains:1164; Blood:50]  Intake/Output this shift: No intake/output data recorded.  Vent settings for last 24 hours: Vent Mode: PRVC FiO2 (%):  [40 %-60 %] 40 % Set Rate:  [24 bmp] 24 bmp Vt Set:  [630 mL] 630 mL PEEP:  [5 cmH20-6 cmH20] 5 cmH20 Plateau Pressure:  [18 cmH20-22 cmH20] 19 cmH20  Physical Exam:  General: on vent Neuro: arouses and F/C well HEENT/Neck: ETT Resp: clear to  auscultation bilaterally CVS: RRR GI: wound dressed, D tube T tube and J tube Extremities: edema 2+  Results for orders placed or performed during the hospital encounter of 01/13/23 (from the past 24 hour(s))  Prepare platelet pheresis     Status: None (Preliminary result)   Collection Time: 02/01/23  9:00 AM  Result Value Ref Range   Unit Number [id number]    Blood Component Type PLTP2 PSORALEN TREATED    Unit division 00    Status of Unit ISSUED    Transfusion Status      OK TO TRANSFUSE Performed at [facility], [address] [place], [place] [address]   I-STAT 7, (LYTES, BLD GAS, ICA, H+H)     Status: Abnormal   Collection Time: 02/01/23  9:11 AM  Result  Value Ref Range   pH, Arterial 7.289 (L) 7.35 - 7.45   pCO2 arterial 49.5 (H) 32 - 48 mmHg   pO2, Arterial 147 (H) 83 - 108 mmHg   Bicarbonate 23.7 20.0 - 28.0 mmol/L   TCO2 25 22 - 32 mmol/L   O2 Saturation 99 %   Acid-base deficit 3.0 (H) 0.0 - 2.0 mmol/L   Sodium 148 (H) 135 - 145 mmol/L   Potassium 3.5 3.5 - 5.1 mmol/L   Calcium, Ion 1.12 (L) 1.15 - 1.40 mmol/L   HCT 34.0 (L) 39.0 - 52.0 %   Hemoglobin 11.6 (L) 13.0 - 17.0 g/dL   Sample type ARTERIAL   Glucose, capillary     Status: Abnormal   Collection Time: 02/01/23 12:00 PM  Result Value Ref Range   Glucose-Capillary 116 (H) 70 - 99 mg/dL  CBC     Status: Abnormal   Collection Time: 02/01/23  1:12 PM  Result Value Ref Range   WBC 9.3 4.0 - 10.5 K/uL   RBC 3.94 (L) 4.22 - 5.81 MIL/uL   Hemoglobin 11.5 (L) 13.0 - 17.0 g/dL   HCT 40.9 (L) 81.1 - 91.4 %   MCV 92.4 80.0 - 100.0 fL   MCH 29.2 26.0 - 34.0 pg   MCHC 31.6 30.0 - 36.0 g/dL   RDW 78.2 (H) 95.6 - 21.3 %   Platelets 46 (L) 150 - 400 K/uL   nRBC 0.0 0.0 - 0.2 %  Glucose, capillary     Status: Abnormal   Collection Time: 02/01/23  5:24 PM  Result Value Ref Range   Glucose-Capillary 108 (H) 70 - 99 mg/dL  Glucose, capillary     Status: Abnormal   Collection Time: 02/01/23  8:39 PM   Result Value Ref Range   Glucose-Capillary 109 (H) 70 - 99 mg/dL  Glucose, capillary     Status: Abnormal   Collection Time: 02/02/23 12:40 AM  Result Value Ref Range   Glucose-Capillary 107 (H) 70 - 99 mg/dL  CBC     Status: Abnormal   Collection Time: 02/02/23  4:45 AM  Result Value Ref Range   WBC 11.5 (H) 4.0 - 10.5 K/uL   RBC 3.95 (L) 4.22 - 5.81 MIL/uL   Hemoglobin 11.7 (L) 13.0 - 17.0 g/dL   HCT 08.6 (L) 57.8 - 46.9 %   MCV 93.4 80.0 - 100.0 fL   MCH 29.6 26.0 - 34.0 pg   MCHC 31.7 30.0 - 36.0 g/dL   RDW 62.9 (H) 52.8 - 41.3 %   Platelets 44 (L) 150 - 400 K/uL   nRBC 0.3 (H) 0.0 - 0.2 %  Comprehensive metabolic panel     Status: Abnormal   Collection Time: 02/02/23  4:45 AM  Result Value Ref Range   Sodium 147 (H) 135 - 145 mmol/L   Potassium 3.7 3.5 - 5.1 mmol/L   Chloride 116 (H) 98 - 111 mmol/L   CO2 24 22 - 32 mmol/L   Glucose, Bld 116 (H) 70 - 99 mg/dL   BUN 76 (H) 8 - 23 mg/dL   Creatinine, Ser 2.44 0.61 - 1.24 mg/dL   Calcium 7.6 (L) 8.9 - 10.3 mg/dL   Total Protein 3.8 (L) 6.5 - 8.1 g/dL   Albumin 1.7 (L) 3.5 - 5.0 g/dL   AST 010 (H) 15 - 41 U/L   ALT 608 (H) 0 - 44 U/L   Alkaline Phosphatase 63 38 - 126 U/L   Total Bilirubin 9.0 (H) 0.3 -  1.2 mg/dL   GFR, Estimated >19 >14 mL/min   Anion gap 7 5 - 15  Magnesium     Status: None   Collection Time: 02/02/23  4:45 AM  Result Value Ref Range   Magnesium 2.2 1.7 - 2.4 mg/dL  Phosphorus     Status: None   Collection Time: 02/02/23  4:45 AM  Result Value Ref Range   Phosphorus 3.8 2.5 - 4.6 mg/dL  I-STAT 7, (LYTES, BLD GAS, ICA, H+H)     Status: Abnormal   Collection Time: 02/02/23  4:57 AM  Result Value Ref Range   pH, Arterial 7.368 7.35 - 7.45   pCO2 arterial 44.2 32 - 48 mmHg   pO2, Arterial 136 (H) 83 - 108 mmHg   Bicarbonate 25.4 20.0 - 28.0 mmol/L   TCO2 27 22 - 32 mmol/L   O2 Saturation 99 %   Acid-Base Excess 0.0 0.0 - 2.0 mmol/L   Sodium 149 (H) 135 - 145 mmol/L   Potassium 3.7 3.5 - 5.1  mmol/L   Calcium, Ion 1.17 1.15 - 1.40 mmol/L   HCT 33.0 (L) 39.0 - 52.0 %   Hemoglobin 11.2 (L) 13.0 - 17.0 g/dL   Patient temperature 78.2 C    Collection site art line    Drawn by Operator    Sample type ARTERIAL   Glucose, capillary     Status: Abnormal   Collection Time: 02/02/23  6:24 AM  Result Value Ref Range   Glucose-Capillary 109 (H) 70 - 99 mg/dL    Assessment & Plan: Present on Admission:  Bowel obstruction (HCC)  Protein-calorie malnutrition, severe (HCC)  Duodenal obstruction    LOS: 20 days   Additional comments:I reviewed the patient's new clinical lab test results. 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 4/23 - Takeback for bleeding with hemorrhagic shock, resection of ischemic transverse colon, left in discontinuity with Abthera 4/24 - Takeback for bleeding, abdominal washout, ileocecectomy, placement of Abthera 4/27 - Takeback, placement of J tube, end ileostomy, abdominal closure   Continue D tube and T tube to gravity Expected ileus  Massive transfusion - Hb 11.5, PLTs 44k Shock - resolved AKI - significant improvement Acute hypoxic ventilator dependent respiratory failure - 40% and PEEP 5, continue weaning trials FEN - TPN, IVF, schedule oxy liquid per J tube and robaxin IV to help pain control, plan dilaudid PCA once extubated, replete mild hypokalemia with lasix plan DVT - SCDs, hold chemical ppx in light of PLTs < 100k Foley - to remain today, diuresing ID - UTI R/O - urine CX neg, empiric zosyn for bowel ischemia, started 4/23 Dispo - ICU, weaning I spoke with family at the bedside Critical Care Total Time*: 39 Minutes  Violeta Gelinas, MD, MPH, FACS Trauma & General Surgery Use [email or web address] to contact on call  provider  02/02/2023  *Care during the described time interval was provided by me. I have reviewed this patient's available data, including medical history, events of note, physical examination and test results as part of my evaluation.

## 2023-02-03 LAB — COMPREHENSIVE METABOLIC PANEL
ALT: 420 U/L — ABNORMAL HIGH (ref 0–44)
AST: 142 U/L — ABNORMAL HIGH (ref 15–41)
Albumin: 1.7 g/dL — ABNORMAL LOW (ref 3.5–5.0)
Alkaline Phosphatase: 83 U/L (ref 38–126)
Anion gap: 10 (ref 5–15)
BUN: 67 mg/dL — ABNORMAL HIGH (ref 8–23)
CO2: 26 mmol/L (ref 22–32)
Calcium: 8 mg/dL — ABNORMAL LOW (ref 8.9–10.3)
Chloride: 118 mmol/L — ABNORMAL HIGH (ref 98–111)
Creatinine, Ser: 1.06 mg/dL (ref 0.61–1.24)
GFR, Estimated: >60 mL/min (ref >60)
Glucose, Bld: 106 mg/dL — ABNORMAL HIGH (ref 70–99)
Potassium: 3.7 mmol/L (ref 3.5–5.1)
Sodium: 154 mmol/L — ABNORMAL HIGH (ref 135–145)
Total Bilirubin: 11.9 mg/dL — ABNORMAL HIGH (ref 0.3–1.2)
Total Protein: 4.1 g/dL — ABNORMAL LOW (ref 6.5–8.1)

## 2023-02-03 LAB — POCT I-STAT 7, (LYTES, BLD GAS, ICA,H+H)
Acid-Base Excess: 0 mmol/L (ref 0.0–2.0)
Bicarbonate: 26.1 mmol/L (ref 20.0–28.0)
Calcium, Ion: 1.23 mmol/L (ref 1.15–1.40)
HCT: 36 % — ABNORMAL LOW (ref 39.0–52.0)
Hemoglobin: 12.2 g/dL — ABNORMAL LOW (ref 13.0–17.0)
O2 Saturation: 98 %
Patient temperature: 97.9
Potassium: 3.6 mmol/L (ref 3.5–5.1)
Sodium: 153 mmol/L — ABNORMAL HIGH (ref 135–145)
TCO2: 28 mmol/L (ref 22–32)
pCO2 arterial: 47 mmHg (ref 32–48)
pH, Arterial: 7.351 (ref 7.35–7.45)
pO2, Arterial: 110 mmHg — ABNORMAL HIGH (ref 83–108)

## 2023-02-03 LAB — CBC
HCT: 38.5 % — ABNORMAL LOW (ref 39.0–52.0)
Hemoglobin: 12.7 g/dL — ABNORMAL LOW (ref 13.0–17.0)
MCH: 30.2 pg (ref 26.0–34.0)
MCHC: 33 g/dL (ref 30.0–36.0)
MCV: 91.4 fL (ref 80.0–100.0)
Platelets: 62 10*3/uL — ABNORMAL LOW (ref 150–400)
RBC: 4.21 MIL/uL — ABNORMAL LOW (ref 4.22–5.81)
RDW: 18.6 % — ABNORMAL HIGH (ref 11.5–15.5)
WBC: 17.4 10*3/uL — ABNORMAL HIGH (ref 4.0–10.5)
nRBC: 0.2 % (ref 0.0–0.2)

## 2023-02-03 LAB — SURGICAL PATHOLOGY

## 2023-02-03 LAB — GLUCOSE, CAPILLARY
Glucose-Capillary: 112 mg/dL — ABNORMAL HIGH (ref 70–99)
Glucose-Capillary: 84 mg/dL (ref 70–99)

## 2023-02-03 LAB — TYPE AND SCREEN
ABO/RH(D): O NEG
Antibody Screen: NEGATIVE

## 2023-02-03 LAB — PHOSPHORUS: Phosphorus: 3.8 mg/dL (ref 2.5–4.6)

## 2023-02-03 LAB — MAGNESIUM: Magnesium: 2 mg/dL (ref 1.7–2.4)

## 2023-02-03 LAB — TRIGLYCERIDES: Triglycerides: 113 mg/dL (ref <150)

## 2023-02-03 LAB — CULTURE, RESPIRATORY W GRAM STAIN

## 2023-02-03 MED ORDER — FUROSEMIDE 10 MG/ML IJ SOLN
40.0000 mg | Freq: Once | INTRAMUSCULAR | Status: AC
  Administered 2023-02-03: 40 mg via INTRAVENOUS
  Filled 2023-02-03: qty 4

## 2023-02-03 MED ORDER — CLONAZEPAM 0.1 MG/ML ORAL SUSPENSION
0.5000 mg | Freq: Two times a day (BID) | ORAL | Status: DC
  Administered 2023-02-03 – 2023-02-16 (×27): 0.5 mg
  Filled 2023-02-03 (×31): qty 5

## 2023-02-03 MED ORDER — CLONAZEPAM 0.1 MG/ML ORAL SUSPENSION: 0.5000 mg | Freq: Two times a day (BID) | ORAL | Status: DC

## 2023-02-03 MED ORDER — QUETIAPINE FUMARATE 25 MG PO TABS
50.0000 mg | ORAL_TABLET | Freq: Two times a day (BID) | ORAL | Status: DC
  Administered 2023-02-03 (×2): 50 mg via JEJUNOSTOMY
  Filled 2023-02-03 (×3): qty 2

## 2023-02-03 MED ORDER — FREE WATER
200.0000 mL | Freq: Four times a day (QID) | Status: DC
  Administered 2023-02-03 – 2023-02-05 (×8): 200 mL

## 2023-02-03 MED ORDER — NOREPINEPHRINE 4 MG/250ML-% IV SOLN: 0.0000 ug/min | INTRAVENOUS | Status: DC

## 2023-02-03 MED ORDER — NOREPINEPHRINE 4 MG/250ML-% IV SOLN
INTRAVENOUS | Status: AC
  Administered 2023-02-03: 4 ug/min via INTRAVENOUS
  Filled 2023-02-03: qty 250

## 2023-02-03 MED ORDER — POTASSIUM CHLORIDE 10 MEQ/50ML IV SOLN
10.0000 meq | INTRAVENOUS | Status: DC
  Filled 2023-02-03 (×2): qty 50

## 2023-02-03 MED ORDER — ZINC CHLORIDE 1 MG/ML IV SOLN
INTRAVENOUS | Status: AC
  Filled 2023-02-03: qty 1322.4

## 2023-02-03 MED ORDER — OXYCODONE HCL 5 MG/5ML PO SOLN
10.0000 mg | ORAL | Status: DC
  Administered 2023-02-03 – 2023-02-05 (×12): 10 mg
  Filled 2023-02-03 (×12): qty 10

## 2023-02-03 MED ORDER — POTASSIUM CHLORIDE 10 MEQ/50ML IV SOLN
10.0000 meq | INTRAVENOUS | Status: AC
  Administered 2023-02-03 (×5): 10 meq via INTRAVENOUS
  Filled 2023-02-03 (×3): qty 50

## 2023-02-03 MED ORDER — INSULIN ASPART 100 UNIT/ML IJ SOLN: 0.0000 [IU] | Freq: Two times a day (BID) | INTRAMUSCULAR | Status: DC

## 2023-02-03 NOTE — Progress Notes (Signed)
Patient ID: Bradley Dominguez, male   DOB: 1956-02-21, 67 y.o.   MRN: [id number] Follow up - Trauma Critical Care   Patient Details:    Bradley Dominguez is an 67 y.o. male.  Lines/tubes : Airway 7.5 mm (Active)  Secured at (cm) 22 cm 02/03/23 0815  Measured From Lips 02/03/23 0815  Secured Location Right 02/03/23 0815  Secured By [employer] 02/03/23 0815  Tube Holder Repositioned Yes 02/03/23 0815  Prone position No 02/03/23 0815  Cuff Pressure (cm H2O) Green OR 18-26 CmH2O 02/03/23 0815  Site Condition Dry 02/03/23 0815     PICC Triple Lumen 01/23/23 Right Basilic 39 cm 0 cm (Active)  Indication for Insertion or Continuance of Line Vasoactive infusions 02/02/23 2000  Exposed Catheter (cm) 0 cm 01/23/23 1840  Site Assessment Clean, Dry, Intact 02/02/23 2000  Lumen #1 Status Infusing 02/02/23 2000  Lumen #2 Status Infusing 02/02/23 2000  Lumen #3 Status Saline locked;Flushed 02/02/23 2000  Dressing Type Transparent 02/02/23 2000  Dressing Status Antimicrobial disc in place;Clean, Dry, Intact 02/02/23 2000  Safety Lock Intact 01/31/23 0800  Line Care Lumen 3 tubing changed;Lumen 3 cap changed;Connections checked and tightened 02/02/23 1742  Line Adjustment (NICU/IV Team Only) No 01/24/23 2000  Dressing Intervention Dressing changed;Antimicrobial disc changed;Securement device changed 01/30/23 1739  Dressing Change Due 02/06/23 02/02/23 2000     CVC Single Lumen 01/27/23 Left Internal jugular (Active)  Indication for Insertion or Continuance of Line Vasoactive infusions 02/02/23 2000  Site Assessment Clean, Dry, Intact 02/02/23 2000  Line Status Flushed;Saline locked 02/02/23 2000  Dressing Type Transparent 02/02/23 2000  Dressing Status Antimicrobial disc in place 02/02/23 2000  Line Care Connections checked and tightened 01/29/23 1100  Dressing Intervention New dressing 01/28/23 0800  Dressing Change Due 02/04/23 02/02/23 2000     CVC Triple Lumen 01/28/23 Right  Internal jugular (Active)  Indication for Insertion or Continuance of Line Vasoactive infusions 02/02/23 2000  Site Assessment Clean, Dry, Intact 02/02/23 2000  Proximal Lumen Status Infusing 02/02/23 2000  Medial Lumen Status Infusing 02/02/23 2000  Distal Lumen Status Infusing 02/02/23 2000  Dressing Type Transparent 02/02/23 2000  Dressing Status Antimicrobial disc in place 02/02/23 2000  Line Care Proximal tubing changed;Proximal cap changed;Connections checked and tightened 01/30/23 1739  Dressing Intervention New dressing;Dressing changed;Antimicrobial disc changed 01/29/23 1100  Dressing Change Due 01/06/23 02/02/23 2000     Arterial Line 01/27/23 Right Radial (Active)  Site Assessment Clean, Dry, Intact 02/02/23 2000  Line Status Pulsatile blood flow 02/02/23 2000  Art Line Waveform Appropriate 02/02/23 2000  Art Line Interventions Connections checked and tightened 01/31/23 2000  Color/Movement/Sensation Capillary refill less than 3 sec 01/31/23 2000  Dressing Type Transparent 02/02/23 2000  Dressing Status Clean, Dry, Intact 02/02/23 2000  Interventions Dressing changed 01/31/23 1000  Dressing Change Due 02/04/23 02/02/23 2000     Closed System Drain 1 Left Abdomen Bulb (JP) 19 Fr. (Active)  Site Description Unremarkable 02/02/23 2000  Dressing Status Clean, Dry, Intact 02/02/23 2000  Drainage Appearance Bloody 02/02/23 2000  Status To suction (Charged) 02/02/23 2000  Output (mL) 10 mL 02/03/23 0500     Biliary Tube T-tube 10 Fr. RUQ (Active)  Site Description Unremarkable 02/02/23 2000  Dressing Status None 02/02/23 2000  Drainage Color Green 02/02/23 2000  Tube Status/Interventions Open to gravity drainage 02/02/23 2000  Output (mL) 20 mL 02/03/23 0500     NG/OG Vented/Dual Lumen Right nare (Active)  Tube Position (Required) Marking at nare/corner of mouth 02/02/23  2000  Measurement (cm) (Required) 73 cm 02/01/23 1000  Ongoing Placement Verification (Required) (See  row information) Yes 02/02/23 2000  Site Assessment Clean, Dry, Intact 02/02/23 2000  Interventions Cleansed 02/02/23 2000  Status Low intermittent suction 02/02/23 2000  Amount of suction 70 mmHg 01/29/23 2000  Drainage Appearance Dark red 01/29/23 2000  Intake (mL) 50 mL 01/24/23 1800  Output (mL) 0 mL 02/03/23 0500     Gastrostomy/Enterostomy Other (Comment) 24 Fr. RUQ (Active)  Surrounding Skin Intact 02/02/23 2000  Tube Status/Interventions Open to gravity drainage 02/02/23 2000  Drainage Appearance Serous 02/02/23 2000  Dressing Status None 02/02/23 2000  Dressing Type Split gauze 02/02/23 2000  Output (mL) 500 mL 02/03/23 0500     Gastrostomy/Enterostomy Jejunostomy 16 Fr. RLQ (Active)  Surrounding Skin Intact 02/02/23 2000  Tube Status/Interventions Clamped 02/02/23 2000  Dressing Status None 02/02/23 2000  G Port Intake (mL) 30 ml 02/03/23 0500  J Port Intake (mL) 30 ml 02/02/23 1800     Ileostomy Standard (end) LLQ (Active)  Ostomy Pouch 1 piece 02/02/23 2000  Stoma Assessment Pink 02/02/23 2000  Peristomal Assessment Intact 02/02/23 2000  Output (mL) 50 mL 02/03/23 0900     Urethral Catheter Octavia Bruckner RN Latex 16 Fr. (Active)  Indication for Insertion or Continuance of Catheter Unstable critically ill patients first 24-48 hours (See Criteria) 02/03/23 0800  Site Assessment Clean, Dry, Intact 02/03/23 0800  Catheter Maintenance Bag below level of bladder;Catheter secured;Drainage bag/tubing not touching floor;Insertion date on drainage bag;No dependent loops;Seal intact;Bag emptied prior to transport 02/03/23 0800  Collection Container Standard drainage bag 02/03/23 0800  Securement Method Securing device (Describe) 02/03/23 0800  Urinary Catheter Interventions (if applicable) Unclamped 02/02/23 2000  Output (mL) 150 mL 02/03/23 0500    Microbiology/Sepsis markers: Results for orders placed or performed during the hospital encounter of 01/13/23  MRSA Next Gen by  PCR, Nasal     Status: None   Collection Time: 01/28/23 12:12 PM   Specimen: Nasal Mucosa; Nasal Swab  Result Value Ref Range Status   MRSA by PCR Next Gen NOT DETECTED NOT DETECTED Final    Comment: (NOTE) The GeneXpert MRSA Assay (FDA approved for NASAL specimens only), is one component of a comprehensive MRSA colonization surveillance program. It is not intended to diagnose MRSA infection nor to guide or monitor treatment for MRSA infections. Test performance is not FDA approved in patients less than 53 years old. Performed at [facility], [address] [place], [place] [address]   Urine Culture (for pregnant, neutropenic or urologic patients or patients with an indwelling urinary catheter)     Status: None   Collection Time: 01/29/23  2:03 PM   Specimen: Urine, Catheterized  Result Value Ref Range Status   Specimen Description URINE, CATHETERIZED  Final   Special Requests NONE  Final   Culture   Final    NO GROWTH Performed at [facility], [address], [place], [place] [address]    Report Status 01/30/2023 FINAL  Final  Surgical PCR screen     Status: Abnormal   Collection Time: 02/01/23  5:13 AM   Specimen: Nasal Mucosa; Nasal Swab  Result Value Ref Range Status   MRSA, PCR NEGATIVE NEGATIVE Final   Staphylococcus aureus POSITIVE (A) NEGATIVE Final    Comment: (NOTE) The Xpert SA Assay (FDA approved for NASAL specimens in patients 11 years of age and older), is one component of a comprehensive surveillance program. It is  not intended to diagnose infection nor to guide or monitor treatment. Performed at [facility], [address], [place], [place] [address]     Anti-infectives:  Anti-infectives (From admission, onward)    Start     Dose/Rate Route Frequency Ordered Stop   01/28/23 1000  piperacillin-tazobactam (ZOSYN) IVPB 3.375 g        3.375 g 12.5 mL/hr over 240 Minutes Intravenous Every 8 hours 01/28/23 0902 02/04/23 1953   01/27/23  1030  ceFAZolin (ANCEF) IVPB 2g/100 mL premix       See Hyperspace for full Linked Orders Report.   2 g 200 mL/hr over 30 Minutes Intravenous On call to O.R. 01/27/23 0746 01/27/23 1135   01/27/23 1030  metroNIDAZOLE (FLAGYL) IVPB 500 mg       See Hyperspace for full Linked Orders Report.   500 mg 100 mL/hr over 60 Minutes Intravenous On call to O.R. 01/27/23 0746 01/27/23 1141   01/19/23 0600  cefTRIAXone (ROCEPHIN) 2 g in sodium chloride 0.9 % 100 mL IVPB       See Hyperspace for full Linked Orders Report.   2 g 200 mL/hr over 30 Minutes Intravenous On call to O.R. 01/19/23 0350 01/19/23 0606   01/19/23 0350  metroNIDAZOLE (FLAGYL) IVPB 500 mg  Status:  Discontinued       See Hyperspace for full Linked Orders Report.   500 mg 100 mL/hr over 60 Minutes Intravenous Every 8 hours 01/19/23 0350 01/19/23 0752      Subjective:    Overnight Issues:   Objective:  Vital signs for last 24 hours: Temp:  [96.6 F (35.9 C)-98.5 F (36.9 C)] 97.9 F (36.6 C) (04/29 0700) Pulse Rate:  [63-117] 81 (04/29 0700) Resp:  [20-25] 22 (04/29 0700) BP: (91-137)/(51-95) 119/63 (04/29 0700) SpO2:  [93 %-99 %] 99 % (04/29 0815) Arterial Line BP: (102-153)/(49-72) 131/62 (04/29 0700) FiO2 (%):  [40 %] 40 % (04/29 0815) Weight:  [96.9 kg] 96.9 kg (04/29 0409)  Hemodynamic parameters for last 24 hours:    Intake/Output from previous day: 04/28 0701 - 04/29 0700 In: 3647.9 [I.V.:2870; IV Piggyback:687.8] Out: 6820 [Urine:5700; Drains:1120]  Intake/Output this shift: Total I/O In: -  Out: 50 [Stool:50]  Vent settings for last 24 hours: Vent Mode: PRVC FiO2 (%):  [40 %] 40 % Set Rate:  [24 bmp] 24 bmp Vt Set:  [630 mL] 630 mL PEEP:  [5 cmH20] 5 cmH20 Plateau Pressure:  [18 cmH20-22 cmH20] 20 cmH20  Physical Exam:  General: on vent Neuro: F/C HEENT/Neck: ETT Resp: few rhonchi CVS: RRR GI: inc dressed, J tube, D tube draining well, T tube draining, ileostomy pink no out Extremities: no  edema, no erythema, pulses WNL and edema 2+  Results for orders placed or performed during the hospital encounter of 01/13/23 (from the past 24 hour(s))  Glucose, capillary     Status: None   Collection Time: 02/02/23 11:42 AM  Result Value Ref Range   Glucose-Capillary 85 70 - 99 mg/dL  Glucose, capillary     Status: None   Collection Time: 02/02/23  6:22 PM  Result Value Ref Range   Glucose-Capillary 85 70 - 99 mg/dL  Glucose, capillary     Status: None   Collection Time: 02/02/23 11:24 PM  Result Value Ref Range   Glucose-Capillary 78 70 - 99 mg/dL  I-STAT 7, (LYTES, BLD GAS, ICA, H+H)     Status: Abnormal   Collection Time: 02/03/23  2:36 AM  Result  Value Ref Range   pH, Arterial 7.351 7.35 - 7.45   pCO2 arterial 47.0 32 - 48 mmHg   pO2, Arterial 110 (H) 83 - 108 mmHg   Bicarbonate 26.1 20.0 - 28.0 mmol/L   TCO2 28 22 - 32 mmol/L   O2 Saturation 98 %   Acid-Base Excess 0.0 0.0 - 2.0 mmol/L   Sodium 153 (H) 135 - 145 mmol/L   Potassium 3.6 3.5 - 5.1 mmol/L   Calcium, Ion 1.23 1.15 - 1.40 mmol/L   HCT 36.0 (L) 39.0 - 52.0 %   Hemoglobin 12.2 (L) 13.0 - 17.0 g/dL   Patient temperature 09.8 F    Collection site art line    Drawn by RT    Sample type ARTERIAL   Comprehensive metabolic panel     Status: Abnormal   Collection Time: 02/03/23  4:13 AM  Result Value Ref Range   Sodium 154 (H) 135 - 145 mmol/L   Potassium 3.7 3.5 - 5.1 mmol/L   Chloride 118 (H) 98 - 111 mmol/L   CO2 26 22 - 32 mmol/L   Glucose, Bld 106 (H) 70 - 99 mg/dL   BUN 67 (H) 8 - 23 mg/dL   Creatinine, Ser 1.19 0.61 - 1.24 mg/dL   Calcium 8.0 (L) 8.9 - 10.3 mg/dL   Total Protein 4.1 (L) 6.5 - 8.1 g/dL   Albumin 1.7 (L) 3.5 - 5.0 g/dL   AST 147 (H) 15 - 41 U/L   ALT 420 (H) 0 - 44 U/L   Alkaline Phosphatase 83 38 - 126 U/L   Total Bilirubin 11.9 (H) 0.3 - 1.2 mg/dL   GFR, Estimated >82 >95 mL/min   Anion gap 10 5 - 15  Magnesium     Status: None   Collection Time: 02/03/23  4:13 AM  Result Value  Ref Range   Magnesium 2.0 1.7 - 2.4 mg/dL  Phosphorus     Status: None   Collection Time: 02/03/23  4:13 AM  Result Value Ref Range   Phosphorus 3.8 2.5 - 4.6 mg/dL  CBC     Status: Abnormal   Collection Time: 02/03/23  4:13 AM  Result Value Ref Range   WBC 17.4 (H) 4.0 - 10.5 K/uL   RBC 4.21 (L) 4.22 - 5.81 MIL/uL   Hemoglobin 12.7 (L) 13.0 - 17.0 g/dL   HCT 62.1 (L) 30.8 - 65.7 %   MCV 91.4 80.0 - 100.0 fL   MCH 30.2 26.0 - 34.0 pg   MCHC 33.0 30.0 - 36.0 g/dL   RDW 84.6 (H) 96.2 - 95.2 %   Platelets 62 (L) 150 - 400 K/uL   nRBC 0.2 0.0 - 0.2 %  Triglycerides     Status: None   Collection Time: 02/03/23  4:13 AM  Result Value Ref Range   Triglycerides 113 <150 mg/dL  Glucose, capillary     Status: Abnormal   Collection Time: 02/03/23  6:29 AM  Result Value Ref Range   Glucose-Capillary 112 (H) 70 - 99 mg/dL    Assessment & Plan: Present on Admission:  Bowel obstruction (HCC)  Protein-calorie malnutrition, severe (HCC)  Duodenal obstruction    LOS: 21 days   Additional comments:I reviewed the patient's new clinical lab test results. / [id number] with massive GI bleeding secondary to small GJ ulceration with a pseudoaneurysm of the jejunal arcade   Duodenal obstruction and UGIB - S/P: 4/22 - Ex lap, adhesiolysis, duodenojejunal bypass, duodenostomy tube placement, T tube placement in common  bile duct. 4/23 - Takeback for bleeding with hemorrhagic shock, resection of ischemic transverse colon, left in discontinuity with Abthera 4/24 - Takeback for bleeding, abdominal washout, ileocecectomy, placement of Abthera 4/27 - Takeback, placement of J tube, end ileostomy, abdominal closure   Continue D tube and T tube to gravity Expected ileus  Massive transfusion - Hb 12.7 and PLTs up to 62k Shock - resolved AKI - significant improvement Acute hypoxic ventilator dependent respiratory failure - 40% and PEEP 5, continue weaning trials FEN - TPN,lasix again x 1, add free water and  D/W Pharmacy to remove Na from TNA for hypernatremia, add klon/sero to aid weaning DVT - SCDs, hold chemical ppx in light of PLTs < 100k Foley - to remain today, diuresing ID - resp CX now, empiric zosyn for bowel ischemia, started 4/23 Dispo - ICU, weaning I spoke with his wife at the bedside Critical Care Total Time*: 43 Minutes  Violeta Gelinas, MD, MPH, FACS Trauma & General Surgery Use [email or web address] to contact on call provider  02/03/2023  *Care during the described time interval was provided by me. I have reviewed this patient's available data, including medical history, events of note, physical examination and test results as part of my evaluation.

## 2023-02-03 NOTE — Consult Note (Signed)
WOC Nurse ostomy consult note Stoma type/location: Stoma is red and viable, 2 inches, edematous, above skin level.  Peristomal assessment: intact Output: no stool or flatus, 30cc blood-tinged liquid in pouch. Ostomy pouching: 2pc.  Previous pouch was beginning to leak behind the barrier. Applied barrier ring and 2 piece pouching system.  WOC team will begin teaching sessions when patient is stable and out of ICU.  Educational materials left at the bedside. Ordered 5 sets of ostomy supplies. Use supplies:  barrier rings, [employer] [place] # [id number], wafer [employer] [place] # 2, pouch [employer] Education provided: Pt is intubated and sedated and not ready for teaching.  His wife is at the bedside and watched the pouch change and asked appropriate questions.  Enrolled patient in [employer] DC program: NOT YET Thank-you,  Cammie Mcgee MSN, RN, [place], [place], CNS [phone]

## 2023-02-03 NOTE — Progress Notes (Signed)
Nutrition Follow-up  DOCUMENTATION CODES:   Severe malnutrition in context of chronic illness  INTERVENTION:   TPN to meet increased nutrition needs  200 ml free water every 6 hours Total free water: 800 ml   Once bowel function returns recommend  Pivot 1.5 with goal rate of 65 ml/hr  Provides: 2340 kcal, 146 grams protein, and 1184 ml free water  NUTRITION DIAGNOSIS:   Severe Malnutrition related to chronic illness as evidenced by moderate fat depletion, severe muscle depletion, energy intake < or equal to 75% for > or equal to 1 month, percent weight loss. Ongoing.   GOAL:   Patient will meet greater than or equal to 90% of their needs Met with TPN at goal   MONITOR:   Diet advancement, Labs, Weight trends, I & O's (TPN)  REASON FOR ASSESSMENT:   Consult New TPN/TNA  ASSESSMENT:   66 yo male who was admitted from home with worsening nausea, vomiting, bloating and weakness. Patient with remote history of multiple prior laparotomies (in the 1970's), with a chronic partial small bowel obstruction.  Pt discussed during ICU rounds and with RN.  Awaiting bowel function before starting enteral nutrition support via j tube Lasix given  Not weaning yet  04/08: admitted, PICC line placed 04/09: TPN initiated 04/13: increased to goal TPN of 40mL/hr 04/14: emergent EGD; oozing gastric ulcer s/p epi and clips 04/15: IR for emergent embolization, Intubated 04/22: s/p ex lap, LOA, duodenojejunal bypass with biliary T-tube, duodenostomy tube 04/23: s/p second OR for ex lap with transverse colectomy, abd packed and open abd, left in discontinuity due to colonic ischemia, ligation of bleeding mesenteric vessels,  04/24: OR due to acute decompensation s/p ex lap with ileocectomy, temp abd closure, MTP (did not receive TPN due to contaminated port) 04/25 - s/p ex lap, washout, SBR (10 cm necrotic distal ileum), VAC  04/27 - s/p ileostomy, j-tube placement, abd closure     Medications reviewed and include: SSI, protonix  Precedex Fentanyl  Levophed off Zosyn KCl x 5  Labs reviewed: Na 154, K 3.7 AST 142, ALT 420 WBC 17.4    TPN via PICC @ 95 ml/hr  Provides: 2075 kcal and 132 g AA Standard MVI and TE  UOP: 5800 ml (s/p lasix) NG: 0 ml T tube: 60 ml  L JP: 10 ml 24 F RUQ: 1050 ml   +26.0 L   Diet Order:   Diet Order             Diet NPO time specified  Diet effective now                   EDUCATION NEEDS:   Education needs have been addressed  Skin:  Skin Assessment: Reviewed RN Assessment  Last BM:  4/15  Height:   Ht Readings from Last 1 Encounters:  01/27/23 6\' 1"  (1.854 m)    Weight:   Wt Readings from Last 1 Encounters:  02/03/23 96.9 kg    BMI:  Body mass index is 28.18 kg/m.  Estimated Nutritional Needs:   Kcal:  2050-2250  Protein:  130-145 grams  Fluid:  2.1L/day  Margaux Engen P., RD, LDN, CNSC See AMiON for contact information

## 2023-02-03 NOTE — Progress Notes (Signed)
2 Days Post-Op  Subjective: Did not tolerate vent weaning yesterday due to agitation and tachypnea. Started oxy via J tube to help with pain. Creatinine normal. Responded well to lasix with 5L UOP. Platelets slowly trending up. Remains off pressors.   Objective: Vital signs in last 24 hours: Temp:  [96.6 F (35.9 C)-98.5 F (36.9 C)] 97.9 F (36.6 C) (04/29 0700) Pulse Rate:  [63-117] 81 (04/29 0700) Resp:  [20-25] 22 (04/29 0700) BP: (91-137)/(51-95) 119/63 (04/29 0700) SpO2:  [93 %-99 %] 96 % (04/29 0700) Arterial Line BP: (102-153)/(49-72) 131/62 (04/29 0700) FiO2 (%):  [40 %] 40 % (04/29 0316) Weight:  [96.9 kg] 96.9 kg (04/29 0409) Last BM Date : 01/30/23 (flexiseal)  Intake/Output from previous day: 04/28 0701 - 04/29 0700 In: 3647.9 [I.V.:2870; IV Piggyback:687.8] Out: 6820 [Urine:5700; Drains:1120] Intake/Output this shift: No intake/output data recorded.  PE: General: NAD Neuro: alert, opens eyes and follows commands Resp: ETT in place, on vent Vent Mode: PRVC FiO2 (%):  [40 %] 40 % Set Rate:  [24 bmp] 24 bmp Vt Set:  [630 mL] 630 mL PEEP:  [5 cmH20] 5 cmH20 Plateau Pressure:  [18 cmH20-22 cmH20] 18 cmH20 CV: RRR Abdomen: soft, nondistended. Midline incision open at skin, clean and dry. LUQ JP with serosanguinous drainage. RUQ: T tube with bilious fluid, duodenostomy to gravity draining succus, J tube capped. LLQ ileostomy is edematous and productive of bowel sweat Extremities: edematous GU: Foley draining yellow urine   Lab Results:  Recent Labs    02/02/23 0445 02/02/23 0457 02/03/23 0236 02/03/23 0413  WBC 11.5*  --   --  17.4*  HGB 11.7*   < > 12.2* 12.7*  HCT 36.9*   < > 36.0* 38.5*  PLT 44*  --   --  62*   < > = values in this interval not displayed.   BMET Recent Labs    02/02/23 0445 02/02/23 0457 02/03/23 0236 02/03/23 0413  NA 147*   < > 153* 154*  K 3.7   < > 3.6 3.7  CL 116*  --   --  118*  CO2 24  --   --  26  GLUCOSE 116*   --   --  106*  BUN 76*  --   --  67*  CREATININE 1.17  --   --  1.06  CALCIUM 7.6*  --   --  8.0*   < > = values in this interval not displayed.   PT/INR Recent Labs    02/01/23 0558  LABPROT 17.7*  INR 1.5*   CMP     Component Value Date/Time   NA 154 (H) 02/03/2023 0413   K 3.7 02/03/2023 0413   CL 118 (H) 02/03/2023 0413   CO2 26 02/03/2023 0413   GLUCOSE 106 (H) 02/03/2023 0413   BUN 67 (H) 02/03/2023 0413   CREATININE 1.06 02/03/2023 0413   CALCIUM 8.0 (L) 02/03/2023 0413   PROT 4.1 (L) 02/03/2023 0413   ALBUMIN 1.7 (L) 02/03/2023 0413   AST 142 (H) 02/03/2023 0413   ALT 420 (H) 02/03/2023 0413   ALKPHOS 83 02/03/2023 0413   BILITOT 11.9 (H) 02/03/2023 0413   GFRNONAA >60 02/03/2023 0413   GFRAA >60 09/23/2017 0507   Lipase  No results found for: "LIPASE"     Studies/Results: DG CHEST PORT 1 VIEW  Result Date: 02/02/2023 CLINICAL DATA:  Respiratory failure. EXAM: PORTABLE CHEST 1 VIEW COMPARISON:  01/30/23 FINDINGS: ETT tip is stable above  the carina. There is a left IJ catheter/sheath, unchanged in position with tip overlying the expected location of the left brachiocephalic vein. There is a right arm PICC line with tip terminating at the superior cavoatrial junction. A right IJ catheter is also identified with tip overlying the distal SVC. Enteric tube tip courses below the level of the GE junction. Stable cardiomediastinal contours. Posterior layering right pleural effusion with veil like opacification over the right lower lung is unchanged from previous exam. IMPRESSION: 1. Unchanged appearance of posterior layering right pleural effusion with veil like opacification of the right lung. 2. Stable support apparatus. Electronically Signed   By: Signa Kell M.D.   On: 02/02/2023 08:36       Assessment/Plan 67 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 4/22 - Ex lap, adhesiolysis, duodenojejunal bypass, duodenostomy tube placement, T tube placement in common bile  duct. 4/23 - Takeback for bleeding with hemorrhagic shock, resection of ischemic transverse colon, left in discontinuity with Abthera 4/24 - Takeback for bleeding, abdominal washout, ileocecectomy, pack placement, placement of Abthera 4/25 - Takeback, resection of 10cm necrotic distal ileum, washout, removal of packs, placement of Abthera 4/27 - Takeback, placement of J tube, end ileostomy, abdominal closure  - Continue TPN at full strength - Elevated LFTs: Transaminases downtrending. Elevated Tbili presumably secondary to massive transfusion. - Thrombocytopenia: Secondary to coagulopathy and massive blood less, improving. Continue to monitor. - Milk AKI: Resolved. Creatinine normal, BUN downtrending. - Keep T tube and D tube to gravity drainage. - Pain/sedation: Fentanyl gtt while intubated, increase oxycodone to q4h. Continue scheduled IV robaxin. - Vent: PSV trials as tolerated for vent weaning, appreciate trauma critical care assistance. - Remove left IJ cordis today - ID: Zosyn for bowel ischemia, plan for 5 days from most recent bowel resection (stop date of 4/30) - FEN: Strict NPO, TPN, ok to give liquid meds only via J tube. No tube feeds yet due to ileus. Awaiting bowel function. - VTE: SCDs, chemical DVT ppx on hold in setting of thrombocytopenia. - Dispo: ICU  I discussed the plan of care with the patient's wife and niece at bedside, and the patient's night shift nurse.   LOS: 21 days    Sophronia Simas, MD [facility] Surgery General, Hepatobiliary and Pancreatic Surgery 02/03/23 7:51 AM

## 2023-02-03 NOTE — Progress Notes (Addendum)
PHARMACY - TOTAL PARENTERAL NUTRITION CONSULT NOTE  Indication: Duodenal obstruction with ileus  Patient Measurements: Height: 6\' 1"  (185.4 cm) Weight: 96.9 kg (213 lb 10 oz) IBW/kg (Calculated) : 79.9 TPN AdjBW (KG): 65.5 Body mass index is 28.18 kg/m.  Assessment:  19 yoM admitted on 4/8 with N/V, chronic partial SBO, history of multiple previous abdominal surgeries. Pt has very minimal PO intake with ongoing weight loss.  Per documentation, patient has severe PCM.  Pharmacy consulted to manage TPN.   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 Placed biliary T-tube, duodenostomy tube - 4/22 2nd OR: ex lap with transverse colectomy, abd packed & left open w/ Abthera dressing - 4/24 OR: acute decompensation >> ex-lap with ileocectomy, temporary abd closure with neg pressure dressing.  MTP. - 4/25: ex-lap with washout, distal ileum resection, temp abd closure - 4/27 reopening of lap, washout and closure, ileostomy/J-tube placement   Central access: double lumen PICC placed 01/13/23 TPN start date: 01/14/23  Nutritional Goals: RD Estimated Needs Total Energy Estimated Needs: 2050-2250 Total Protein Estimated Needs: 130-145 grams Total Fluid Estimated Needs: 2.1L/day  Current Nutrition:    TPN  Plan:  Continue TPN at goal rate of 95 ml/hr to provide 132g AA and 2075 kCal, meeting 100% of needs Electrolytes in TPN: reduce Na to 11mEq/L, K 30 mEq/L, Ca 10 mEq/L, Mg 9 mEq/L, Phos 84mmol/L, change Cl:Ac to 1:2 with no Na in TPN Add standard MVI to TPN Remove multitrace d/t elevated tbili and add back zinc/chromium/selenium  Reduce sensitive SSI to Q12H Add free water Q6H - Lasix 40mg  IV x 1, KCL x 5 runs per Trauma Monitor TPN labs on Mon/Thurs Monitor volume status, return of bowel function to initiate TF  Aowyn Rozeboom D. Laney Potash, PharmD, BCPS, BCCCP 02/03/2023, 9:52 AM

## 2023-02-04 ENCOUNTER — Inpatient Hospital Stay (HOSPITAL_COMMUNITY): Payer: BC Managed Care – PPO

## 2023-02-04 LAB — BASIC METABOLIC PANEL
Anion gap: 6 (ref 5–15)
BUN: 57 mg/dL — ABNORMAL HIGH (ref 8–23)
CO2: 25 mmol/L (ref 22–32)
Calcium: 8 mg/dL — ABNORMAL LOW (ref 8.9–10.3)
Chloride: 118 mmol/L — ABNORMAL HIGH (ref 98–111)
Creatinine, Ser: 0.87 mg/dL (ref 0.61–1.24)
GFR, Estimated: >60 mL/min (ref >60)
Glucose, Bld: 111 mg/dL — ABNORMAL HIGH (ref 70–99)
Potassium: 3.7 mmol/L (ref 3.5–5.1)
Sodium: 149 mmol/L — ABNORMAL HIGH (ref 135–145)

## 2023-02-04 LAB — CULTURE, RESPIRATORY W GRAM STAIN

## 2023-02-04 LAB — CBC
HCT: 38.7 % — ABNORMAL LOW (ref 39.0–52.0)
Hemoglobin: 12.6 g/dL — ABNORMAL LOW (ref 13.0–17.0)
MCH: 29.5 pg (ref 26.0–34.0)
MCHC: 32.6 g/dL (ref 30.0–36.0)
MCV: 90.6 fL (ref 80.0–100.0)
Platelets: 93 10*3/uL — ABNORMAL LOW (ref 150–400)
RBC: 4.27 MIL/uL (ref 4.22–5.81)
RDW: 19 % — ABNORMAL HIGH (ref 11.5–15.5)
WBC: 14.9 10*3/uL — ABNORMAL HIGH (ref 4.0–10.5)
nRBC: 0 % (ref 0.0–0.2)

## 2023-02-04 LAB — GLUCOSE, CAPILLARY
Glucose-Capillary: 100 mg/dL — ABNORMAL HIGH (ref 70–99)
Glucose-Capillary: 82 mg/dL (ref 70–99)

## 2023-02-04 MED ORDER — HYDROMORPHONE HCL 1 MG/ML IJ SOLN
0.5000 mg | INTRAMUSCULAR | Status: DC | PRN
  Administered 2023-02-04 (×3): 1 mg via INTRAVENOUS
  Filled 2023-02-04 (×3): qty 1

## 2023-02-04 MED ORDER — FUROSEMIDE 10 MG/ML IJ SOLN
40.0000 mg | Freq: Once | INTRAMUSCULAR | Status: AC
  Administered 2023-02-04: 40 mg via INTRAVENOUS
  Filled 2023-02-04: qty 4

## 2023-02-04 MED ORDER — ZINC CHLORIDE 1 MG/ML IV SOLN
INTRAVENOUS | Status: AC
  Filled 2023-02-04: qty 1322.4

## 2023-02-04 MED ORDER — POTASSIUM CHLORIDE 10 MEQ/50ML IV SOLN
10.0000 meq | INTRAVENOUS | Status: AC
  Administered 2023-02-04 (×6): 10 meq via INTRAVENOUS
  Filled 2023-02-04 (×6): qty 50

## 2023-02-04 NOTE — Progress Notes (Signed)
3 Days Post-Op  Subjective: Tolerated 6-7 hours of PSV yesterday. On low dose levophed with sedation. Creatinine normal, BUN continues to downtrend. Platelets up to 94. Diuresing.    Objective: Vital signs in last 24 hours: Temp:  [96.8 F (36 C)-98.2 F (36.8 C)] 98.2 F (36.8 C) (04/30 0800) Pulse Rate:  [54-78] 64 (04/30 0800) Resp:  [11-24] 24 (04/30 0800) BP: (84-120)/(54-82) 99/71 (04/30 0800) SpO2:  [95 %-100 %] 100 % (04/30 0808) Arterial Line BP: (91-168)/(45-70) 135/51 (04/30 0800) FiO2 (%):  [40 %] 40 % (04/30 0808) Last BM Date : 01/30/23 (flexiseal)  Intake/Output from previous day: 04/29 0701 - 04/30 0700 In: 3667.3 [I.V.:2737.9; IV Piggyback:669.5] Out: 7110 [Urine:5235; Drains:1795; Stool:80] Intake/Output this shift: Total I/O In: 174.8 [I.V.:114.4; IV Piggyback:60.4] Out: 125 [Urine:125]  PE: General: NAD Neuro: alert, mildly agitated Resp: ETT in place, on vent Vent Mode: CPAP;PSV FiO2 (%):  [40 %] 40 % Set Rate:  [24 bmp] 24 bmp Vt Set:  [630 mL] 630 mL PEEP:  [5 cmH20] 5 cmH20 Pressure Support:  [10 cmH20] 10 cmH20 Plateau Pressure:  [17 cmH20-23 cmH20] 19 cmH20 CV: RRR Abdomen: soft, nondistended. Midline incision open at skin, clean and dry. LUQ JP with turbid serosanguinous fluid. RUQ: T tube with bilious fluid, duodenostomy to gravity draining succus, J tube capped. LLQ ileostomy is edematous and productive of bowel sweat Extremities: edematous GU: Foley draining yellow urine   Lab Results:  Recent Labs    02/03/23 0413 02/04/23 0552  WBC 17.4* 14.9*  HGB 12.7* 12.6*  HCT 38.5* 38.7*  PLT 62* 93*   BMET Recent Labs    02/03/23 0413 02/04/23 0552  NA 154* 149*  K 3.7 3.7  CL 118* 118*  CO2 26 25  GLUCOSE 106* 111*  BUN 67* 57*  CREATININE 1.06 0.87  CALCIUM 8.0* 8.0*   PT/INR No results for input(s): "LABPROT", "INR" in the last 72 hours.  CMP     Component Value Date/Time   NA 149 (H) 02/04/2023 0552   K 3.7  02/04/2023 0552   CL 118 (H) 02/04/2023 0552   CO2 25 02/04/2023 0552   GLUCOSE 111 (H) 02/04/2023 0552   BUN 57 (H) 02/04/2023 0552   CREATININE 0.87 02/04/2023 0552   CALCIUM 8.0 (L) 02/04/2023 0552   PROT 4.1 (L) 02/03/2023 0413   ALBUMIN 1.7 (L) 02/03/2023 0413   AST 142 (H) 02/03/2023 0413   ALT 420 (H) 02/03/2023 0413   ALKPHOS 83 02/03/2023 0413   BILITOT 11.9 (H) 02/03/2023 0413   GFRNONAA >60 02/04/2023 0552   GFRAA >60 09/23/2017 0507   Lipase  No results found for: "LIPASE"     Studies/Results: DG CHEST PORT 1 VIEW  Result Date: 02/04/2023 CLINICAL DATA:  Respiratory failure. EXAM: PORTABLE CHEST 1 VIEW COMPARISON:  February 02, 2023. FINDINGS: The heart size and mediastinal contours are within normal limits. Endotracheal and nasogastric tubes are unchanged. Right internal jugular catheter is unchanged. Stable right pleural effusion is noted with associated atelectasis or infiltrate. Minimal left basilar subsegmental atelectasis is noted. The visualized skeletal structures are unremarkable. IMPRESSION: Stable support apparatus. Stable right pleural effusion with associated right basilar atelectasis or infiltrate. Electronically Signed   By: Lupita Raider M.D.   On: 02/04/2023 07:58       Assessment/Plan 67 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 4/22 - Ex lap, adhesiolysis, duodenojejunal bypass, duodenostomy tube placement, T tube placement in common bile duct. 4/23 - Takeback for bleeding with  hemorrhagic shock, resection of ischemic transverse colon, left in discontinuity with Abthera 4/24 - Takeback for bleeding, abdominal washout, ileocecectomy, pack placement, placement of Abthera 4/25 - Takeback, resection of 10cm necrotic distal ileum, washout, removal of packs, placement of Abthera 4/27 - Takeback, placement of J tube, end ileostomy, abdominal closure  - Continue TPN at full strength - Elevated LFTs: Transaminases downtrending. Elevated Tbili presumably secondary  to massive transfusion. Trend twice weekly. - Thrombocytopenia: Secondary to coagulopathy and massive blood less, improving. - Hypernatremia: Improving, sodium removed from TPN, free water added. - Keep T tube and D tube to gravity drainage. - Pain/sedation: Weaning fentanyl gtt. Continue oxycodone, klonipin and seroquel per J tube. - Vent: PSV trials as tolerated for vent weaning, appreciate trauma critical care assistance. - ID: Zosyn for bowel ischemia. Tracheal aspirate with GNRs, possible infiltrate on CXR. Extend Zosyn course to treat HCAP. - FEN: Strict NPO, TPN, ok to give liquid meds only via J tube. No tube feeds yet due to ileus.  - WOC following for ostomy care and teaching - Foley: Keep in place while diuresing - VTE: SCDs, chemical DVT ppx on hold in setting of thrombocytopenia. Plan to start lovenox when platelets > 100k - Dispo: ICU  I discussed the plan of care with the patient's wife at bedside, as well as patient's nurse for today.   LOS: 22 days    Sophronia Simas, MD [facility], Hepatobiliary and Pancreatic Surgery 02/04/23 8:56 AM

## 2023-02-04 NOTE — Progress Notes (Addendum)
Patient ID: Bradley Dominguez, male   DOB: [birth year], 67 y.o.   MRN: [id number] Follow up - Trauma Critical Care   Patient Details:    Bradley Dominguez is an 67 y.o. male.  Lines/tubes : Airway 7.5 mm (Active)  Secured at (cm) 22 cm 02/04/23 0808  Measured From Lips 02/04/23 0808  Secured Location Left 02/04/23 0808  Secured By [employer] 02/04/23 0808  Tube Holder Repositioned Yes 02/04/23 0808  Prone position No 02/04/23 0808  Cuff Pressure (cm H2O) Green OR 18-26 [facility] 02/04/23 0808  Site Condition Dry 02/04/23 0808     PICC Triple Lumen 01/23/23 Right Basilic 39 cm 0 cm (Active)  Indication for Insertion or Continuance of Line Vasoactive infusions 02/03/23 2000  Exposed Catheter (cm) 0 cm 02/03/23 1822  Site Assessment Clean, Dry, Intact 02/03/23 2000  Lumen #1 Status Infusing 02/03/23 2000  Lumen #2 Status Infusing 02/03/23 2000  Lumen #3 Status Infusing 02/03/23 2000  Dressing Type Transparent;Securing device 02/03/23 2000  Dressing Status Antimicrobial disc in place;Clean, Dry, Intact 02/03/23 2000  Safety Lock Not Applicable 02/03/23 1822  Line Care Lumen 1 cap changed;Lumen 2 cap changed;Lumen 3 cap changed;Connections checked and tightened 02/04/23 0500  Line Adjustment (NICU/IV Team Only) No 01/24/23 2000  Dressing Intervention Dressing changed;Antimicrobial disc changed 02/04/23 0500  Dressing Change Due 02/11/23 02/04/23 0500     CVC Triple Lumen 01/28/23 Right Internal jugular (Active)  Indication for Insertion or Continuance of Line Vasoactive infusions 02/03/23 2000  Site Assessment Clean, Dry, Intact 02/03/23 2000  Proximal Lumen Status Infusing 02/03/23 2000  Medial Lumen Status Infusing 02/03/23 2000  Distal Lumen Status Flushed;Saline locked 02/03/23 2000  Dressing Type Transparent 02/03/23 2000  Dressing Status Antimicrobial disc in place;Clean, Dry, Intact 02/03/23 2000  Line Care Proximal cap changed;Medial cap changed;Distal cap  changed;Connections checked and tightened 02/04/23 0500  Dressing Intervention Dressing changed;Antimicrobial disc changed;Securement device changed 02/04/23 0500  Dressing Change Due 02/11/23 02/04/23 0500     Arterial Line 01/27/23 Right Radial (Active)  Site Assessment Clean, Dry, Intact 02/03/23 2000  Line Status Pulsatile blood flow 02/03/23 2000  Art Line Waveform Appropriate 02/03/23 2000  Art Line Interventions Zeroed and calibrated;Leveled;Connections checked and tightened;Flushed per protocol;Line pulled back 02/03/23 2000  Color/Movement/Sensation Capillary refill less than 3 sec 02/03/23 2000  Dressing Type Transparent 02/03/23 2000  Dressing Status Clean, Dry, Intact 02/03/23 2000  Interventions Dressing changed 01/31/23 1000  Dressing Change Due 02/04/23 02/03/23 2000     Closed System Drain 1 Left Abdomen Bulb (JP) 19 Fr. (Active)  Site Description Unremarkable 02/03/23 2000  Dressing Status Clean, Dry, Intact 02/03/23 2000  Drainage Appearance Bloody 02/03/23 2000  Status To suction (Charged) 02/03/23 2000  Output (mL) 40 mL 02/04/23 0600     Biliary Tube T-tube 10 Fr. RUQ (Active)  Site Description Unremarkable 02/03/23 2000  Dressing Status Intact;New drainage 02/03/23 2000  Drainage Color Green 02/03/23 2000  Tube Status/Interventions Open to gravity drainage 02/03/23 2000  Output (mL) 30 mL 02/04/23 0600     NG/OG Vented/Dual Lumen Right nare (Active)  Tube Position (Required) Marking at nare/corner of mouth 02/03/23 2000  Measurement (cm) (Required) 73 cm 02/01/23 1000  Ongoing Placement Verification (Required) (See row information) Yes 02/03/23 2000  Site Assessment Clean, Dry, Intact 02/03/23 2000  Interventions Cleansed 02/03/23 2000  Status Low intermittent suction 02/03/23 2000  Amount of suction 70 mmHg 01/29/23 2000  Drainage Appearance Dark red 01/29/23 2000  Intake (mL) 50 mL 01/24/23 1800  Output (mL) 0 mL 02/03/23 0500      Gastrostomy/Enterostomy Other (Comment) 24 Fr. RUQ (Active)  Surrounding Skin Intact 02/03/23 2000  Tube Status/Interventions Open to gravity drainage 02/03/23 2000  Drainage Appearance Brown;Serosanguineous;Thick 02/03/23 2000  Dressing Status Intact 02/03/23 2000  Dressing Type Split gauze 02/03/23 2000  Output (mL) 225 mL 02/04/23 0600     Gastrostomy/Enterostomy Jejunostomy 16 Fr. RLQ (Active)  Surrounding Skin Intact 02/03/23 2000  Tube Status/Interventions Open to gravity drainage 02/03/23 2000  Dressing Status Intact 02/03/23 2000  Dressing Type Split gauze 02/03/23 2000  G Port Intake (mL) 30 ml 02/03/23 0500  J Port Intake (mL) 30 ml 02/04/23 0200     Ileostomy Standard (end) LLQ (Active)  Ostomy Pouch 1 piece 02/03/23 2000  Stoma Assessment Pink 02/03/23 2000  Peristomal Assessment Intact 02/03/23 2000  Output (mL) 30 mL 02/03/23 0943     Urethral Catheter Badi Ali RN Latex 16 Fr. (Active)  Indication for Insertion or Continuance of Catheter Therapy based on hourly urine output monitoring and documentation for critical condition (NOT STRICT I&O) 02/04/23 0800  Site Assessment Clean, Dry, Intact 02/04/23 0800  Catheter Maintenance Bag below level of bladder;Catheter secured;Drainage bag/tubing not touching floor;Insertion date on drainage bag;No dependent loops;Seal intact;Bag emptied prior to transport 02/04/23 0800  Collection Container Dedicated Suction Canister 02/04/23 0800  Securement Method Securing device (Describe) 02/04/23 0800  Urinary Catheter Interventions (if applicable) Unclamped 02/03/23 2000  Output (mL) 125 mL 02/04/23 0800    Microbiology/Sepsis markers: Results for orders placed or performed during the hospital encounter of 01/13/23  MRSA Next Gen by PCR, Nasal     Status: None   Collection Time: 01/28/23 12:12 PM   Specimen: Nasal Mucosa; Nasal Swab  Result Value Ref Range Status   MRSA by PCR Next Gen NOT DETECTED NOT DETECTED Final    Comment:  (NOTE) The GeneXpert MRSA Assay (FDA approved for NASAL specimens only), is one component of a comprehensive MRSA colonization surveillance program. It is not intended to diagnose MRSA infection nor to guide or monitor treatment for MRSA infections. Test performance is not FDA approved in patients less than 47 years old. Performed at [facility], [address] [place], [place] [address]   Urine Culture (for pregnant, neutropenic or urologic patients or patients with an indwelling urinary catheter)     Status: None   Collection Time: 01/29/23  2:03 PM   Specimen: Urine, Catheterized  Result Value Ref Range Status   Specimen Description URINE, CATHETERIZED  Final   Special Requests NONE  Final   Culture   Final    NO GROWTH Performed at [facility], [address] [place], [place] [address]    Report Status 01/30/2023 FINAL  Final  Surgical PCR screen     Status: Abnormal   Collection Time: 02/01/23  5:13 AM   Specimen: Nasal Mucosa; Nasal Swab  Result Value Ref Range Status   MRSA, PCR NEGATIVE NEGATIVE Final   Staphylococcus aureus POSITIVE (A) NEGATIVE Final    Comment: (NOTE) The Xpert SA Assay (FDA approved for NASAL specimens in patients 28 years of age and older), is one component of a comprehensive surveillance program. It is not intended to diagnose infection nor to guide or monitor treatment. Performed at [facility], [address] [place], [place] [address]   Culture, Respiratory w Gram Stain     Status: None (Preliminary result)   Collection Time: 02/03/23 12:21 PM  Specimen: Tracheal Aspirate; Respiratory  Result Value Ref Range Status   Specimen Description TRACHEAL ASPIRATE  Final   Special Requests NONE  Final   Gram Stain   Final    FEW WBC PRESENT, PREDOMINANTLY PMN MODERATE GRAM NEGATIVE RODS Performed at [facility], [address] [place], [place] [address]    Culture PENDING  Incomplete   Report Status PENDING   Incomplete    Anti-infectives:  Anti-infectives (From admission, onward)    Start     Dose/Rate Route Frequency Ordered Stop   01/28/23 1000  piperacillin-tazobactam (ZOSYN) IVPB 3.375 g        3.375 g 12.5 mL/hr over 240 Minutes Intravenous Every 8 hours 01/28/23 0902 02/10/23 0159   01/27/23 1030  ceFAZolin (ANCEF) IVPB 2g/100 mL premix       See Hyperspace for full Linked Orders Report.   2 g 200 mL/hr over 30 Minutes Intravenous On call to O.R. 01/27/23 0746 01/27/23 1135   01/27/23 1030  metroNIDAZOLE (FLAGYL) IVPB 500 mg       See Hyperspace for full Linked Orders Report.   500 mg 100 mL/hr over 60 Minutes Intravenous On call to O.R. 01/27/23 0746 01/27/23 1141   01/19/23 0600  cefTRIAXone (ROCEPHIN) 2 g in sodium chloride 0.9 % 100 mL IVPB       See Hyperspace for full Linked Orders Report.   2 g 200 mL/hr over 30 Minutes Intravenous On call to O.R. 01/19/23 0350 01/19/23 0606   01/19/23 0350  metroNIDAZOLE (FLAGYL) IVPB 500 mg  Status:  Discontinued       See Hyperspace for full Linked Orders Report.   500 mg 100 mL/hr over 60 Minutes Intravenous Every 8 hours 01/19/23 0350 01/19/23 0752       Subjective:    Overnight Issues:  stable Objective:  Vital signs for last 24 hours: Temp:  [96.8 F (36 C)-98.2 F (36.8 C)] 98.2 F (36.8 C) (04/30 0800) Pulse Rate:  [54-78] 64 (04/30 0800) Resp:  [11-24] 24 (04/30 0800) BP: (84-113)/(54-73) 99/71 (04/30 0800) SpO2:  [95 %-100 %] 100 % (04/30 0808) Arterial Line BP: (91-168)/(45-70) 135/51 (04/30 0800) FiO2 (%):  [40 %] 40 % (04/30 0808)  Hemodynamic parameters for last 24 hours:    Intake/Output from previous day: 04/29 0701 - 04/30 0700 In: 3667.3 [I.V.:2737.9; IV Piggyback:669.5] Out: 7110 [Urine:5235; Drains:1795; Stool:80]  Intake/Output this shift: Total I/O In: 174.8 [I.V.:114.4; IV Piggyback:60.4] Out: 125 [Urine:125]  Vent settings for last 24 hours: Vent Mode: CPAP;PSV FiO2 (%):  [40 %] 40 % Set  Rate:  [24 bmp] 24 bmp Vt Set:  [630 mL] 630 mL PEEP:  [5 cmH20] 5 cmH20 Pressure Support:  [10 cmH20] 10 cmH20 Plateau Pressure:  [17 cmH20-23 cmH20] 19 cmH20  Physical Exam:  General: on vent Neuro: F/C well HEENT/Neck: ETT and NGT Resp: few R rhonchi CVS: RRR GI: wound OK, ileostomy pink no output, D tube, T tube J tube, JP now with bile Extremities: edema 2+  Results for orders placed or performed during the hospital encounter of 01/13/23 (from the past 24 hour(s))  Culture, Respiratory w Gram Stain     Status: None (Preliminary result)   Collection Time: 02/03/23 12:21 PM   Specimen: Tracheal Aspirate; Respiratory  Result Value Ref Range   Specimen Description TRACHEAL ASPIRATE    Special Requests NONE    Gram Stain      FEW WBC PRESENT, PREDOMINANTLY PMN MODERATE GRAM NEGATIVE RODS Performed at  [facility], [address] [place], [place] [address]    Culture PENDING    Report Status PENDING   Glucose, capillary     Status: None   Collection Time: 02/03/23  7:45 PM  Result Value Ref Range   Glucose-Capillary 84 70 - 99 mg/dL  Type and screen [facility]     Status: None   Collection Time: 02/03/23  8:38 PM  Result Value Ref Range   ABO/RH(D) O NEG    Antibody Screen NEG    Sample Expiration      02/06/2023,2359 Performed at [facility], [address], [place], [place] [address]   Basic metabolic panel     Status: Abnormal   Collection Time: 02/04/23  5:52 AM  Result Value Ref Range   Sodium 149 (H) 135 - 145 mmol/L   Potassium 3.7 3.5 - 5.1 mmol/L   Chloride 118 (H) 98 - 111 mmol/L   CO2 25 22 - 32 mmol/L   Glucose, Bld 111 (H) 70 - 99 mg/dL   BUN 57 (H) 8 - 23 mg/dL   Creatinine, Ser 0.98 0.61 - 1.24 mg/dL   Calcium 8.0 (L) 8.9 - 10.3 mg/dL   GFR, Estimated >11 >91 mL/min   Anion gap 6 5 - 15  CBC     Status: Abnormal   Collection Time: 02/04/23  5:52 AM  Result Value Ref Range   WBC 14.9 (H) 4.0 - 10.5 K/uL   RBC 4.27  4.22 - 5.81 MIL/uL   Hemoglobin 12.6 (L) 13.0 - 17.0 g/dL   HCT 47.8 (L) 29.5 - 62.1 %   MCV 90.6 80.0 - 100.0 fL   MCH 29.5 26.0 - 34.0 pg   MCHC 32.6 30.0 - 36.0 g/dL   RDW 30.8 (H) 65.7 - 84.6 %   Platelets 93 (L) 150 - 400 K/uL   nRBC 0.0 0.0 - 0.2 %  Glucose, capillary     Status: Abnormal   Collection Time: 02/04/23  7:30 AM  Result Value Ref Range   Glucose-Capillary 100 (H) 70 - 99 mg/dL    Assessment & Plan: Present on Admission:  Bowel obstruction (HCC)  Protein-calorie malnutrition, severe (HCC)  Duodenal obstruction    LOS: 22 days   Additional comments:I reviewed the patient's new clinical lab test results. And CXR 25M with massive GI bleeding secondary to small GJ ulceration with a pseudoaneurysm of the jejunal arcade   Duodenal obstruction and UGIB - S/P: 4/22 - Ex lap, adhesiolysis, duodenojejunal bypass, duodenostomy tube placement, T tube placement in common bile duct. 4/23 - Takeback for bleeding with hemorrhagic shock, resection of ischemic transverse colon, left in discontinuity with Abthera 4/24 - Takeback for bleeding, abdominal washout, ileocecectomy, placement of Abthera 4/27 - Takeback, placement of J tube, end ileostomy, abdominal closure   Continue D tube and T tube to gravity Expected ileus Liquid meds only per J tube  Massive transfusion - Hb 12.6 and PLTs up to 93k  AKI - resolved Acute hypoxic ventilator dependent respiratory failure - weaned well yesterday and this AM, TV 950 spont, extubate now FEN - TPN, hyponatremia improving with free water and removing Na from TNA, 11L urine out over 48h DVT - SCDs, hold chemical ppx in light of PLTs < 100k Foley - remove later today if does well ID - resp CX still P, empiric zosyn for bowel ischemia, started 4/23 Dispo - ICU, extuabtion I spoke with his wife at the bedside  Critical Care Total Time*: 45 Minutes  Violeta Gelinas, MD, MPH, FACS Trauma & General Surgery Use [email or web address] to contact on  call provider  02/04/2023  *Care during the described time interval was provided by me. I have reviewed this patient's available data, including medical history, events of note, physical examination and test results as part of my evaluation.

## 2023-02-04 NOTE — Progress Notes (Signed)
PHARMACY - TOTAL PARENTERAL NUTRITION CONSULT NOTE  Indication: Duodenal obstruction with ileus  Patient Measurements: Height: 6\' 1"  (185.4 cm) Weight: 96.9 kg (213 lb 10 oz) IBW/kg (Calculated) : 79.9 TPN AdjBW (KG): 65.5 Body mass index is 28.18 kg/m.  Assessment:  68 yoM admitted on 4/8 with N/V, chronic partial SBO, history of multiple previous abdominal surgeries. Pt has very minimal PO intake with ongoing weight loss.  Per documentation, patient has severe PCM.  Pharmacy consulted to manage TPN.   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 Placed biliary T-tube, duodenostomy tube - 4/22 2nd OR: ex lap with transverse colectomy, abd packed & left open w/ Abthera dressing - 4/24 OR: acute decompensation >> ex-lap with ileocectomy, temporary abd closure with neg pressure dressing.  MTP. - 4/25: ex-lap with washout, distal ileum resection, temp abd closure - 4/27 reopening of lap, washout and closure, ileostomy/J-tube placement   Central access: double lumen PICC placed 01/13/23 TPN start date: 01/14/23  Nutritional Goals: RD Estimated Needs Total Energy Estimated Needs: 2050-2250 Total Protein Estimated Needs: 130-145 grams Total Fluid  Estimated Needs: 2.1L/day  Current Nutrition:   TPN  Plan:  Continue TPN at goal rate of 95 ml/hr to provide 132g AA and 2075 kCal, meeting 100% of needs Electrolytes in TPN: Na 5mEq/L, K 30 mEq/L, Ca 10 mEq/L, Mg 9 mEq/L, Phos 42mmol/L, change Cl:Ac to 1:2 with no Na in TPN Will give 60 mEq IV KCL, received 5 with 40mg  IV lasix yesterday and maintained K.  Add standard MVI to TPN Remove multitrace d/t elevated tbili and add back zinc/chromium/selenium  D/C SSI, BS well controlled.  Continue free water Q6H - Lasix 40mg  IV x 1 per CCS.  Monitor volume status, once sodium improved consider concentrating TPN.  Monitor TPN labs on Mon/Thurs, recheck in AM given diuresis.  Monitor return of bowel function to initiate TF.   Estill Batten, PharmD, BCCCP  02/04/2023, 8:33 AM

## 2023-02-04 NOTE — Procedures (Signed)
Extubation Procedure Note  Patient Details:   Name: Bradley Dominguez DOB: June 20, 1956 MRN: [id number]   Airway Documentation:    Vent end date: 02/04/23 Vent end time: 0948   Evaluation  O2 sats: stable throughout Complications: No apparent complications Patient did tolerate procedure well. Bilateral Breath Sounds: Clear, Diminished   Yes  Pt extubated per order to 4L [place]. Pt had a positive cuff leak, able to state name, with a good, strong cough and no stridor was noted. RT will monitor as needed.   Kerri Perches 02/04/2023, 9:48 AM

## 2023-02-05 DIAGNOSIS — L8990 Pressure ulcer of unspecified site, unspecified stage: Secondary | ICD-10-CM | POA: Diagnosis not present

## 2023-02-05 DIAGNOSIS — J151 Pneumonia due to Pseudomonas: Secondary | ICD-10-CM | POA: Diagnosis not present

## 2023-02-05 LAB — CBC
HCT: 39.3 % (ref 39.0–52.0)
Hemoglobin: 12.8 g/dL — ABNORMAL LOW (ref 13.0–17.0)
MCH: 29.6 pg (ref 26.0–34.0)
MCHC: 32.6 g/dL (ref 30.0–36.0)
MCV: 90.8 fL (ref 80.0–100.0)
Platelets: 133 10*3/uL — ABNORMAL LOW (ref 150–400)
RBC: 4.33 MIL/uL (ref 4.22–5.81)
RDW: 19.4 % — ABNORMAL HIGH (ref 11.5–15.5)
WBC: 15.7 10*3/uL — ABNORMAL HIGH (ref 4.0–10.5)
nRBC: 0 % (ref 0.0–0.2)

## 2023-02-05 LAB — BASIC METABOLIC PANEL
Anion gap: 3 — ABNORMAL LOW (ref 5–15)
BUN: 45 mg/dL — ABNORMAL HIGH (ref 8–23)
CO2: 27 mmol/L (ref 22–32)
Calcium: 7.8 mg/dL — ABNORMAL LOW (ref 8.9–10.3)
Chloride: 114 mmol/L — ABNORMAL HIGH (ref 98–111)
Creatinine, Ser: 0.8 mg/dL (ref 0.61–1.24)
GFR, Estimated: >60 mL/min (ref >60)
Glucose, Bld: 112 mg/dL — ABNORMAL HIGH (ref 70–99)
Potassium: 3.9 mmol/L (ref 3.5–5.1)
Sodium: 144 mmol/L (ref 135–145)

## 2023-02-05 LAB — GLUCOSE, CAPILLARY
Glucose-Capillary: 105 mg/dL — ABNORMAL HIGH (ref 70–99)
Glucose-Capillary: 110 mg/dL — ABNORMAL HIGH (ref 70–99)

## 2023-02-05 LAB — CULTURE, RESPIRATORY W GRAM STAIN

## 2023-02-05 MED ORDER — OXYCODONE HCL 5 MG/5ML PO SOLN
5.0000 mg | ORAL | Status: DC | PRN
  Administered 2023-02-05 – 2023-02-10 (×4): 5 mg
  Administered 2023-02-10 (×2): 10 mg
  Administered 2023-02-11 – 2023-02-12 (×5): 5 mg
  Administered 2023-02-12 – 2023-02-13 (×4): 10 mg
  Administered 2023-02-13 – 2023-02-14 (×2): 5 mg
  Administered 2023-02-14: 10 mg
  Administered 2023-02-15 (×3): 5 mg
  Administered 2023-02-16 – 2023-02-18 (×4): 10 mg
  Filled 2023-02-05 (×4): qty 10
  Filled 2023-02-05: qty 5
  Filled 2023-02-05 (×2): qty 10
  Filled 2023-02-05 (×2): qty 5
  Filled 2023-02-05 (×2): qty 10
  Filled 2023-02-05 (×3): qty 5
  Filled 2023-02-05 (×2): qty 10
  Filled 2023-02-05 (×2): qty 5
  Filled 2023-02-05: qty 10
  Filled 2023-02-05 (×3): qty 5
  Filled 2023-02-05 (×2): qty 10
  Filled 2023-02-05: qty 5
  Filled 2023-02-05: qty 10

## 2023-02-05 MED ORDER — FUROSEMIDE 10 MG/ML IJ SOLN
40.0000 mg | Freq: Once | INTRAMUSCULAR | Status: AC
  Administered 2023-02-05: 40 mg via INTRAVENOUS
  Filled 2023-02-05: qty 4

## 2023-02-05 MED ORDER — OXYCODONE HCL 5 MG/5ML PO SOLN
5.0000 mg | Freq: Four times a day (QID) | ORAL | Status: DC
  Administered 2023-02-05 – 2023-02-07 (×9): 5 mg
  Filled 2023-02-05 (×10): qty 5

## 2023-02-05 MED ORDER — HYDROMORPHONE HCL 1 MG/ML IJ SOLN
0.5000 mg | INTRAMUSCULAR | Status: DC | PRN
  Administered 2023-02-05: 1 mg via INTRAVENOUS
  Administered 2023-02-06 (×4): 0.5 mg via INTRAVENOUS
  Administered 2023-02-07 – 2023-02-10 (×17): 1 mg via INTRAVENOUS
  Administered 2023-02-11: 0.5 mg via INTRAVENOUS
  Administered 2023-02-12 – 2023-02-14 (×3): 1 mg via INTRAVENOUS
  Filled 2023-02-05 (×26): qty 1

## 2023-02-05 MED ORDER — FREE WATER
50.0000 mL | Freq: Four times a day (QID) | Status: DC
  Administered 2023-02-05 – 2023-02-07 (×9): 50 mL

## 2023-02-05 MED ORDER — ZINC CHLORIDE 1 MG/ML IV SOLN
INTRAVENOUS | Status: AC
  Filled 2023-02-05: qty 1322.4

## 2023-02-05 MED ORDER — OXYCODONE HCL 5 MG/5ML PO SOLN: 7.5000 mg | Freq: Four times a day (QID) | ORAL | Status: DC

## 2023-02-05 MED ORDER — CIPROFLOXACIN IN D5W 400 MG/200ML IV SOLN
400.0000 mg | Freq: Two times a day (BID) | INTRAVENOUS | Status: AC
  Administered 2023-02-05 – 2023-02-11 (×14): 400 mg via INTRAVENOUS
  Filled 2023-02-05 (×14): qty 200

## 2023-02-05 MED ORDER — DEXMEDETOMIDINE HCL IN NACL 400 MCG/100ML IV SOLN: 0.0000 ug/kg/h | INTRAVENOUS | Status: AC

## 2023-02-05 MED ORDER — HALOPERIDOL LACTATE 5 MG/ML IJ SOLN: 5.0000 mg | Freq: Four times a day (QID) | INTRAMUSCULAR | Status: DC | PRN

## 2023-02-05 MED ORDER — QUETIAPINE FUMARATE 50 MG PO TABS
50.0000 mg | ORAL_TABLET | Freq: Every day | ORAL | Status: DC
  Administered 2023-02-05 – 2023-02-16 (×10): 50 mg via JEJUNOSTOMY
  Filled 2023-02-05: qty 1
  Filled 2023-02-05 (×4): qty 2
  Filled 2023-02-05 (×3): qty 1
  Filled 2023-02-05 (×3): qty 2

## 2023-02-05 MED ORDER — ENOXAPARIN SODIUM 40 MG/0.4ML IJ SOSY
40.0000 mg | PREFILLED_SYRINGE | INTRAMUSCULAR | Status: DC
  Administered 2023-02-05 – 2023-02-17 (×13): 40 mg via SUBCUTANEOUS
  Filled 2023-02-05 (×13): qty 0.4

## 2023-02-05 NOTE — Progress Notes (Signed)
4 Days Post-Op  Subjective: Extubated yesterday. Confused overnight. On low dose precedex. Creatinine remains normal, hgb stable.   Objective: Vital signs in last 24 hours: Temp:  [97.2 F (36.2 C)-98.4 F (36.9 C)] 97.2 F (36.2 C) (05/01 0400) Pulse Rate:  [55-104] 69 (05/01 0600) Resp:  [10-29] 25 (05/01 0600) BP: (98-125)/(54-81) 101/62 (05/01 0600) SpO2:  [93 %-100 %] 93 % (05/01 0600) Arterial Line BP: (90-161)/(44-71) 140/51 (05/01 0600) FiO2 (%):  [40 %] 40 % (04/30 0808) Last BM Date : 01/30/23 (flexiseal)  Intake/Output from previous day: 04/30 0701 - 05/01 0700 In: 2913.3 [I.V.:2211; IV Piggyback:702.3] Out: 4098 [Urine:5800; Drains:1965] Intake/Output this shift: No intake/output data recorded.  PE: General: NAD, appears frail Neuro: alert, no focal deficits Resp: nonlabored respirations on nasal cannula CV: RRR Abdomen: soft, nondistended. Midline incision open at skin, clean and dry. LUQ JP with turbid serosanguinous fluid. RUQ: T tube with bilious fluid, duodenostomy to gravity draining succus, J tube capped. LLQ ileostomy is edematous and productive of bowel sweat Extremities: edematous, improved from prior GU: Foley draining yellow urine   Lab Results:  Recent Labs    02/04/23 0552 02/05/23 0500  WBC 14.9* 15.7*  HGB 12.6* 12.8*  HCT 38.7* 39.3  PLT 93* 133*   BMET Recent Labs    02/04/23 0552 02/05/23 0500  NA 149* 144  K 3.7 3.9  CL 118* 114*  CO2 25 27  GLUCOSE 111* 112*  BUN 57* 45*  CREATININE 0.87 0.80  CALCIUM 8.0* 7.8*   PT/INR No results for input(s): "LABPROT", "INR" in the last 72 hours.  CMP     Component Value Date/Time   NA 144 02/05/2023 0500   K 3.9 02/05/2023 0500   CL 114 (H) 02/05/2023 0500   CO2 27 02/05/2023 0500   GLUCOSE 112 (H) 02/05/2023 0500   BUN 45 (H) 02/05/2023 0500   CREATININE 0.80 02/05/2023 0500   CALCIUM 7.8 (L) 02/05/2023 0500   PROT 4.1 (L) 02/03/2023 0413   ALBUMIN 1.7 (L)  02/03/2023 0413   AST 142 (H) 02/03/2023 0413   ALT 420 (H) 02/03/2023 0413   ALKPHOS 83 02/03/2023 0413   BILITOT 11.9 (H) 02/03/2023 0413   GFRNONAA >60 02/05/2023 0500   GFRAA >60 09/23/2017 0507   Lipase  No results found for: "LIPASE"     Studies/Results: DG CHEST PORT 1 VIEW  Result Date: 02/04/2023 CLINICAL DATA:  Respiratory failure. EXAM: PORTABLE CHEST 1 VIEW COMPARISON:  February 02, 2023. FINDINGS: The heart size and mediastinal contours are within normal limits. Endotracheal and nasogastric tubes are unchanged. Right internal jugular catheter is unchanged. Stable right pleural effusion is noted with associated atelectasis or infiltrate. Minimal left basilar subsegmental atelectasis is noted. The visualized skeletal structures are unremarkable. IMPRESSION: Stable support apparatus. Stable right pleural effusion with associated right basilar atelectasis or infiltrate. Electronically Signed   By: Lupita Raider M.D.   On: 02/04/2023 07:58       Assessment/Plan 67 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 4/22 - Ex lap, adhesiolysis, duodenojejunal bypass, duodenostomy tube placement, T tube placement in common bile duct. 4/23 - Takeback for bleeding with hemorrhagic shock, resection of ischemic transverse colon, left in discontinuity with Abthera 4/24 - Takeback for bleeding, abdominal washout, ileocecectomy, pack placement, placement of Abthera 4/25 - Takeback, resection of 10cm necrotic distal ileum, washout, removal of packs, placement of Abthera 4/27 - Takeback, placement of J tube, end ileostomy, abdominal closure  - Continue TPN at full  strength - Elevated LFTs: Transaminases downtrending. Elevated Tbili presumably secondary to massive transfusion. Trend twice weekly. - Thrombocytopenia: Improving - Hypernatremia: Resolved. - Keep T tube and D tube to gravity drainage. - Pain: oxycodone, prn dilaudid, scheduled IV robaxin - Agitation/delirium: Begin Seroquel qhs, continue  Klonopin BID, wean precedex. Delirium precautions. - ID: Pseudomonas growing respiratory cultures, sensitivities pending. Continue Zosyn. - FEN: Strict NPO, TPN, expected ileus. Awaiting return of bowel function. - WOC following for ostomy care and teaching - Remove Foley today. - VTE: SCDs, start prophylactic lovenox today now that platelets are >100k. - Dispo: ICU  I discussed the plan of care with the patient's wife at bedside, as well as patient's nurse for today.   LOS: 23 days    Sophronia Simas, MD [facility], Hepatobiliary and Pancreatic Surgery 02/05/23 7:42 AM

## 2023-02-05 NOTE — Evaluation (Signed)
Clinical/Bedside Swallow Evaluation Patient Details  Name: Bradley Dominguez MRN: [id number] Date of Birth: [birth year]  Today's Date: 02/05/2023 Time: SLP Start Time (ACUTE ONLY): 1200 SLP Stop Time (ACUTE ONLY): 1220 SLP Time Calculation (min) (ACUTE ONLY): 20 min  Past Medical History:  Past Medical History:  Diagnosis Date   Arthritis    HANDS   Bowel obstruction (HCC)    CHF (congestive heart failure) (HCC) 11/26/2018   Elevated brain natriuretic peptide (BNP) level 11/25/2018   Family history of adverse reaction to anesthesia    FATHER SLOW TO AWAKEN, MOTHER POST OP N/V   Frequent UTI    History of colon polyps 2009   History of shingles    Plantar fasciitis    Prostate hypertrophy    Vitamin B 12 deficiency    Past Surgical History:  Past Surgical History:  Procedure Laterality Date   APPENDECTOMY     APPLICATION OF WOUND VAC  01/29/2023   Procedure: APPLICATION OF WOUND VAC;  Surgeon: Fritzi Mandes, MD;  Location: [facility] OR;  Service: General;;   BIOPSY  01/16/2023   Procedure: BIOPSY;  Surgeon: Lynann Bologna, MD;  Location: [facility];  Service: Gastroenterology;;   BOWEL RESECTION N/A 01/30/2023   Procedure: SMALL BOWEL RESECTION;  Surgeon: Fritzi Mandes, MD;  Location: [facility] OR;  Service: General;  Laterality: N/A;   CECOSTOMY N/A 01/29/2023   Procedure: Meliton Rattan;  Surgeon: Fritzi Mandes, MD;  Location: [facility] OR;  Service: General;  Laterality: N/A;   CHOLECYSTECTOMY     COLONSCOPY  AGE 47   WITH POLYP REMOVED   ESOPHAGOGASTRODUODENOSCOPY N/A 01/19/2023   Procedure: ESOPHAGOGASTRODUODENOSCOPY (EGD);  Surgeon: Lynann Bologna, MD;  Location: [facility];  Service: Gastroenterology;  Laterality: N/A;   ESOPHAGOGASTRODUODENOSCOPY (EGD) WITH PROPOFOL N/A 01/16/2023   Procedure: ESOPHAGOGASTRODUODENOSCOPY (EGD) WITH PROPOFOL;  Surgeon: Lynann Bologna, MD;  Location: [facility];  Service: Gastroenterology;  Laterality: N/A;   GASTROSTOMY  02/01/2023   Procedure: INSERTION OF  JEJUNOSTOMY TUBE;  Surgeon: Fritzi Mandes, MD;  Location: [facility] OR;  Service: General;;   HEMOSTASIS CLIP PLACEMENT  01/19/2023   Procedure: HEMOSTASIS CLIP PLACEMENT;  Surgeon: Lynann Bologna, MD;  Location: [facility];  Service: Gastroenterology;;   HEMOSTASIS CONTROL  01/19/2023   Procedure: HEMOSTASIS CONTROL;  Surgeon: Lynann Bologna, MD;  Location: [facility];  Service: Gastroenterology;;   IR ANGIOGRAM SELECTIVE EACH ADDITIONAL VESSEL  01/20/2023   IR ANGIOGRAM SELECTIVE EACH ADDITIONAL VESSEL  01/20/2023   IR ANGIOGRAM SELECTIVE EACH ADDITIONAL VESSEL  01/20/2023   IR ANGIOGRAM VISCERAL SELECTIVE  01/20/2023   IR ANGIOGRAM VISCERAL SELECTIVE  01/20/2023   IR CHEST FLUORO  01/20/2023   IR EMBO ART  VEN HEMORR LYMPH EXTRAV  INC GUIDE ROADMAPPING  01/20/2023   IR FLUORO GUIDE CV LINE RIGHT  01/20/2023   IR US GUIDE VASC ACCESS RIGHT  01/20/2023   IR US GUIDE VASC ACCESS RIGHT  01/20/2023   LAPAROSCOPIC ABDOMINAL EXPLORATION N/A 01/29/2023   Procedure: ReExploration Laparotomy, Removal of Abdominal Packing, Repacked Abdomen Packing;  Surgeon: Fritzi Mandes, MD;  Location: [facility] OR;  Service: General;  Laterality: N/A;   LAPAROTOMY N/A 01/27/2023   Procedure: EXPLORATORY LAPAROTOMY, LYSIS OF ADHESIONS, DUODENOJEJUNAL BYPASS, PLACEMENT OF BILIARY T-TUBE, PLACEMENT OF DUODENOSTOMY TUBE;  Surgeon: Fritzi Mandes, MD;  Location: [facility];  Service: General;  Laterality: N/A;   LAPAROTOMY N/A 01/28/2023   Procedure: EXPLORATORY LAPAROTOMY, PARTIAL BOWEL RESECTION, AND ABTHERA WOUND VAC APPLICATION;  Surgeon: Fritzi Mandes, MD;  Location:  [facility] ORS;  Service: General;  Laterality: N/A;  EIGHT 18X18 LAPS LEFT INSIDE WOUND SITE   LAPAROTOMY N/A 01/29/2023   Procedure: OPEN ABDOMINAL WASHOUT;  Surgeon: Fritzi Mandes, MD;  Location: [facility] OR;  Service: General;  Laterality: N/A;   LAPAROTOMY N/A 01/30/2023   Procedure: REOPENING OF LAPAROTOMY, ABDOMINAL WASHOUT;  Surgeon: Fritzi Mandes, MD;  Location: [facility] OR;   Service: General;  Laterality: N/A;   LAPAROTOMY N/A 02/01/2023   Procedure: RE-OPENING  OF LAPAROTOMY, ABDOMINAL WASHOUT, ABDOMINAL CLOSURE, ILEOSTOMY PLACEMENT;  Surgeon: Fritzi Mandes, MD;  Location: [facility] OR;  Service: General;  Laterality: N/A;   SMALL INTESTINE SURGERY     1979   SUBMUCOSAL INJECTION  01/19/2023   Procedure: SUBMUCOSAL INJECTION;  Surgeon: Lynann Bologna, MD;  Location: [facility];  Service: Gastroenterology;;   TRANSURETHRAL RESECTION OF PROSTATE N/A 09/22/2017   Procedure: TRANSURETHRAL RESECTION OF THE PROSTATE (TURP);  Surgeon: Malen Gauze, MD;  Location: [facility];  Service: Urology;  Laterality: N/A;   HPI:  69M with massive GI bleeding secondary to 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 Multiple procedures over 22 days, initial surgery on 4/11. Further intubations on 4/14-4/17, 4/23-4/30. Pt NPO with NG tube set to suction at the time of eval, order for SLP, but DO NOT WRITE DIET. MD only to write diet.    Assessment / Plan / Recommendation  Clinical Impression  Pt demonstrates multiple swallows and delayed coughing after straw sips of water. Coughing is painful given abdominal surgeries. Pt has large bore NG tube and was just extubated yesterday. Obstructing tube, prolonged intubation and deconditioning in a frail pt with pna warrant very cautious administration of any PO. He capable of ice with RN, but would not advise sips yet, until pt is further out from intubation or tolerating a 3 oz water swallow. Pt may benefit from a FEES or MBS if concerns persist. Regardless, at this time MD does not appear interested in starting significant oral intake only moisture for comfort; pt is on TPN. Recommend sticking with ice at this time. SLP Visit Diagnosis: Dysphagia, oropharyngeal phase (R13.12)    Aspiration Risk  Moderate aspiration risk    Diet Recommendation Ice chips PRN after oral care   Medication Administration:  Via alternative means    Other  Recommendations      Recommendations for follow up therapy are one component of a multi-disciplinary discharge planning process, led by the attending physician.  Recommendations may be updated based on patient status, additional functional criteria and insurance authorization.  Follow up Recommendations Long-term institutional care without follow-up therapy      Assistance Recommended at Discharge    Functional Status Assessment    Frequency and Duration min 2x/week  2 weeks       Prognosis Prognosis for improved oropharyngeal function: Good Barriers to Reach Goals: Severity of deficits      Swallow Study   General HPI: 70M with massive GI bleeding secondary to 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 Multiple procedures over 22 days, initial surgery on 4/11. Further intubations on 4/14-4/17, 4/23-4/30. Pt NPO with NG tube set to suction at the time of eval, order for SLP, but DO NOT WRITE DIET. MD only to write diet. Type of Study: Bedside Swallow Evaluation Diet Prior to this Study: NPO;Large bore NG tube Temperature Spikes Noted: No History of Recent Intubation: Yes Total duration of intubation (days): 13 days Date extubated: 02/04/23  Behavior/Cognition: Alert;Cooperative Oral Cavity Assessment: Within Functional Limits Oral Care Completed by SLP: No Oral Cavity - Dentition: Adequate natural dentition Vision: Impaired for self-feeding Self-Feeding Abilities: Total assist Patient Positioning: Upright in bed Baseline Vocal Quality: Low vocal intensity Volitional Cough: Congested Volitional Swallow: Able to elicit    Oral/Motor/Sensory Function Overall Oral Motor/Sensory Function: Within functional limits   Ice Chips Ice chips: Within functional limits   Thin Liquid Thin Liquid: Impaired Presentation: Straw Pharyngeal  Phase Impairments: Cough - Delayed    Nectar Thick Nectar Thick Liquid: Not tested   Honey Thick  Honey Thick Liquid: Not tested   Puree Puree: Not tested   Solid     Solid: Not tested      Elvin Mccartin, Riley Nearing 02/05/2023,2:12 PM

## 2023-02-05 NOTE — Progress Notes (Addendum)
PHARMACY - TOTAL PARENTERAL NUTRITION CONSULT NOTE  Indication: Duodenal obstruction with ileus  Patient Measurements: Height: 6\' 1"  (185.4 cm) Weight: 100.1 kg (220 lb 10.9 oz) IBW/kg (Calculated) : 79.9 TPN AdjBW (KG): 65.5 Body mass index is 29.12 kg/m.  Assessment:  50 yoM admitted on 4/8 with N/V, chronic partial SBO, history of multiple previous abdominal surgeries. Pt has very minimal PO intake with ongoing weight loss.  Per documentation, patient has severe PCM.  Pharmacy consulted to manage TPN.   Glucose / Insulin: no hx DM - CBGs well controlled No SSI use, spaced to Q12H on 4/29  Electrolytes: Na/CL down to 144/114, FWF 200 Q6H decreased to 50 q4h per CCS, K 3.9 post Lasix IV and 6 runs KCL, others WNL Renal: SCr 0.80, BUN down to 45 Hepatic: 4/29 : LFTs elevated post-op 4/22 now decreasing, tbili up to 11.9 (slight yellowing of sclera), TG WNL, albumin 1.7 Intake / Output; MIVF: UOP 2.6 ml/kg/hr with Lasix, drain , net +20.1L, LBM 4/25  GI Imaging:  -4/4 UGI: Findings suggestive of chronic pSBO or ileus -4/9 CT: Ileus vs high grade partial SBO GI Surgeries / Procedures:  -4/11 EGD: 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 Placed biliary T-tube, duodenostomy tube - 4/22 2nd OR: ex lap with transverse colectomy, abd packed & left open w/ Abthera dressing - 4/24 OR: acute decompensation >> ex-lap with ileocectomy, temporary abd closure with neg pressure dressing.  MTP. - 4/25: ex-lap with washout, distal ileum resection, temp abd closure - 4/27 reopening of lap, washout and closure, ileostomy/J-tube placement   Central access: double lumen PICC placed 01/13/23 TPN start date: 01/14/23  Nutritional Goals: RD Estimated Needs Total Energy Estimated Needs: 2050-2250 Total Protein Estimated  Needs: 130-145 grams Total Fluid Estimated Needs: 2.1L/day  Current Nutrition:   TPN  Plan:  Continue TPN at goal rate of 95 ml/hr to provide 132g AA and 2075 kCal, meeting 100% of needs Electrolytes in TPN: Na 37mEq/L, small increase in K 35 mEq/L ( to ), Ca 10 mEq/L, Mg 9 mEq/L, Phos 16mmol/L, change Cl:Ac to 1:2 with no Na in TPN Add standard MVI to TPN Remove multitrace d/t elevated tbili and add back zinc/chromium/selenium  Lasix 40 x ordered per CCS.  Monitor volume status, consider concentrating on 5/2.  Monitor TPN labs on Mon/Thurs, recheck in AM given Na changes.  Monitor return of bowel function to initiate TF.   Estill Batten, PharmD, BCCCP  02/05/2023, 9:26 AM

## 2023-02-05 NOTE — Progress Notes (Signed)
Inpatient Rehab Admissions Coordinator:   Per Therapy recommendations, patient was screened for CIR candidacy by Megan Salon, MS, CCC-SLP. At this time, Pt. is not yet at a level to tolerate the intensity of CIR and is not medically stable for CIR (remains on TPN) .   Pt. may have potential to progress to becoming a potential CIR candidate, so CIR admissions team will follow and monitor for medical readiness and participation with therapies and place consult order if Pt. appears to be an appropriate candidate. Please contact me with any questions.   Megan Salon, MS, CCC-SLP Rehab Admissions Coordinator  [phone] (celll) [phone] (office)

## 2023-02-05 NOTE — Evaluation (Signed)
Physical Therapy Evaluation Patient Details Name: Bradley Dominguez MRN: [id number] DOB: March 13, 1956 Today's Date: 02/05/2023  History of Present Illness  67 yo M HF, chronic partial small bowel obstruction and multiple previous abd surgeries  who was admitted  4/8 from home w n/v/bloating. Underwent EGD 4/11 -- concerning for duodenal obstruction. Developed hemorrhagic shock 4/15 in the setting of hematemesis / hematochezia, decompensated requiring, intubation, embolization of jejunal arcade branch. Extubated 01/22/23. Underwent ex lap adhesiolysis, duodenojejunal bypass, duodenostomy tube placement, T tube placement in common bile duct 4/22, transferred to [facility] 4/23 with hemorrhagic shock, return to OR 4/23, 4/24, 4/25 and finally 4/27 with placement of J tube, end ileostomy, abdominal closure.  Extubated 4/30.  Clinical Impression  Patient presenting with decreased mobility due to pain, decreased sitting balance, decreased cognition, decreased activity tolerance and generalized weakness.  He was previously independent, ambulatory and working.  Now needing mod to max A for up to EOB and not able to stand without +2 A.  He will benefit from skilled PT to progress mobility and allow return home following inpatient rehab stay.       Recommendations for follow up therapy are one component of a multi-disciplinary discharge planning process, led by the attending physician.  Recommendations may be updated based on patient status, additional functional criteria and insurance authorization.  Follow Up Recommendations       Assistance Recommended at Discharge Frequent or constant Supervision/Assistance  Patient can return home with the following  Two people to help with walking and/or transfers;Assistance with cooking/housework;Direct supervision/assist for medications management;Assist for transportation;Help with stairs or ramp for entrance;Two people to help with bathing/dressing/bathroom    Equipment  Recommendations Other (comment) (TBA)  Recommendations for Other Services  Rehab consult    Functional Status Assessment Patient has had a recent decline in their functional status and demonstrates the ability to make significant improvements in function in a reasonable and predictable amount of time.     Precautions / Restrictions Precautions Precautions: Fall Precaution Comments: NG suction, multiple lines, JP drain, biliary tube, ileostomy      Mobility  Bed Mobility Overal bed mobility: Needs Assistance Bed Mobility: Rolling, Sidelying to Sit, Sit to Sidelying Rolling: Mod assist, +2 for safety/equipment Sidelying to sit: Max assist     Sit to sidelying: Mod assist General bed mobility comments: assist for lines, lifting help for trunk, rolling with assist to reach railing and during positioning with RN present to assist; to sidelying assist for legs into bed    Transfers                   General transfer comment: NT with no +2 available    Ambulation/Gait                  Stairs            Wheelchair Mobility    Modified Rankin (Stroke Patients Only)       Balance Overall balance assessment: Needs assistance Sitting-balance support: Feet supported Sitting balance-Leahy Scale: Fair Sitting balance - Comments: able to sit EOB about 6-7 minutes with head and neck flexion encouraged upright posture, limited ability to extend wtih cervical stiffness                                     Pertinent Vitals/Pain Pain Assessment Pain Assessment: Faces Faces Pain Scale: Hurts even more Pain Location: generalized especially  during supine<>sit Pain Descriptors / Indicators: Grimacing, Guarding, Discomfort Pain Intervention(s): Monitored during session, Repositioned, Limited activity within patient's tolerance    Home Living Family/patient expects to be discharged to:: Private residence Living Arrangements: Spouse/significant  other Available Help at Discharge: Family Type of Home: House Home Access: Stairs to enter   Secretary/administrator of Steps: 3   Home Layout: Two level;Able to live on main level with bedroom/bathroom Home Equipment: None      Prior Function Prior Level of Function : Independent/Modified Independent;Driving;Working/employed             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        Extremity/Trunk Assessment   Upper Extremity Assessment Upper Extremity Assessment: LUE deficits/detail;RUE deficits/detail RUE Deficits / Details: AAROM grossly WFL; strength shoulder flexion 3-/5, elbow flexion 4/5, edema throughout forearms LUE Deficits / Details: AAROM grossly WFL; strength shoulder flexion 3-/5, elbow flexion 4/5, edema throughout forearms    Lower Extremity Assessment Lower Extremity Assessment: LLE deficits/detail;RLE deficits/detail RLE Deficits / Details: AAROM WFL, strength hip flexion 3-/5, knee extension 3+/5, ankle DF limited with edema more than on L LLE Deficits / Details: AAROM WFL, strength hip flexion 3/5, knee extension 4-/5    Cervical / Trunk Assessment Cervical / Trunk Assessment: Other exceptions Cervical / Trunk Exceptions: cervical ROM tight in flexion and R rotation from prolonged positioning on vent  Communication   Communication: Other (comment) (hoarse with NG tube, recent extubation)  Cognition Arousal/Alertness: Awake/alert Behavior During Therapy: Flat affect Overall Cognitive Status: Impaired/Different from baseline Area of Impairment: Orientation, Attention, Safety/judgement, Following commands, Problem solving                 Orientation Level: Disoriented to, Place, Time, Situation Current Attention Level: Focused   Following Commands: Follows one step commands with increased time, Follows one step commands consistently Safety/Judgement: Decreased awareness of safety, Decreased awareness of deficits    Problem Solving: Slow processing, Decreased initiation, Difficulty sequencing, Requires verbal cues General Comments: wife present and helping to orient        General Comments General comments (skin integrity, edema, etc.): wife present and supportive, pt with yellow jaundice, BP stable, HR max 122, SpO2 stable on RA    Exercises     Assessment/Plan    PT Assessment Patient needs continued PT services  PT Problem List Decreased strength;Decreased balance;Decreased cognition;Decreased mobility;Decreased activity tolerance;Cardiopulmonary status limiting activity       PT Treatment Interventions DME instruction;Therapeutic activities;Gait training;Functional mobility training;Therapeutic exercise;Patient/family education;Balance training;Cognitive remediation    PT Goals (Current goals can be found in the Care Plan section)  Acute Rehab PT Goals Patient Stated Goal: get stronger and go home PT Goal Formulation: With patient/family Time For Goal Achievement: 02/19/23 Potential to Achieve Goals: Fair    Frequency Min 3X/week     Co-evaluation               AM-PAC PT "6 Clicks" Mobility  Outcome Measure Help needed turning from your back to your side while in a flat bed without using bedrails?: A Lot Help needed moving from lying on your back to sitting on the side of a flat bed without using bedrails?: A Lot Help needed moving to and from a bed to a chair (including a wheelchair)?: Total Help needed standing up from a chair using your arms (e.g., wheelchair or bedside chair)?: Total Help needed to walk in hospital room?: Total Help needed climbing  3-5 steps with a railing? : Total 6 Click Score: 8    End of Session   Activity Tolerance: Patient limited by fatigue Patient left: in bed;with call bell/phone within reach;with family/visitor present;with nursing/sitter in room   PT Visit Diagnosis: Other abnormalities of gait and mobility (R26.89);Muscle weakness  (generalized) (M62.81);Other symptoms and signs involving the nervous system (R29.898)    Time: 1610-9604 PT Time Calculation (min) (ACUTE ONLY): 24 min   Charges:   PT Evaluation $PT Re-evaluation: 1 Re-eval PT Treatments $Therapeutic Activity: 8-22 mins        Sheran Lawless, PT Acute Rehabilitation Services Office:[phone] 02/05/2023    [name] 02/05/2023, 2:06 PM

## 2023-02-05 NOTE — Progress Notes (Signed)
Trauma/Critical Care Follow Up Note  Subjective:    Overnight Issues:   Objective:  Vital signs for last 24 hours: Temp:  [97.2 F (36.2 C)-98.4 F (36.9 C)] 98 F (36.7 C) (05/01 0800) Pulse Rate:  [55-104] 79 (05/01 0900) Resp:  [10-29] 25 (05/01 0900) BP: (98-125)/(54-81) 115/64 (05/01 0900) SpO2:  [93 %-100 %] 96 % (05/01 0900) Arterial Line BP: (90-157)/(44-71) 154/63 (05/01 0900) Weight:  [100.1 kg] 100.1 kg (05/01 0714)  Hemodynamic parameters for last 24 hours:    Intake/Output from previous day: 04/30 0701 - 05/01 0700 In: 2913.3 [I.V.:2211; IV Piggyback:702.3] Out: 7765 [Urine:5800; Drains:1965]  Intake/Output this shift: Total I/O In: 642.8 [I.V.:504.7; IV Piggyback:138.1] Out: -   Vent settings for last 24 hours:    Physical Exam:  Gen: comfortable, no distress Neuro: follows commands, but very sleepy HEENT: PERRL Neck: supple CV: RRR Pulm: unlabored breathing on [place] Abd: soft, NT, midline wound with granulation tissue, JP SS, J-tube glamped, D-tube to gravity, T-tube to gravity GU: urine clear and yellow, +Foley Extr: wwp, 2+ edema  Results for orders placed or performed during the hospital encounter of 01/13/23 (from the past 24 hour(s))  Glucose, capillary     Status: None   Collection Time: 02/04/23  8:14 PM  Result Value Ref Range   Glucose-Capillary 82 70 - 99 mg/dL  CBC     Status: Abnormal   Collection Time: 02/05/23  5:00 AM  Result Value Ref Range   WBC 15.7 (H) 4.0 - 10.5 K/uL   RBC 4.33 4.22 - 5.81 MIL/uL   Hemoglobin 12.8 (L) 13.0 - 17.0 g/dL   HCT 16.1 09.6 - 04.5 %   MCV 90.8 80.0 - 100.0 fL   MCH 29.6 26.0 - 34.0 pg   MCHC 32.6 30.0 - 36.0 g/dL   RDW 40.9 (H) 81.1 - 91.4 %   Platelets 133 (L) 150 - 400 K/uL   nRBC 0.0 0.0 - 0.2 %  Basic metabolic panel     Status: Abnormal   Collection Time: 02/05/23  5:00 AM  Result Value Ref Range   Sodium 144 135 - 145 mmol/L   Potassium 3.9 3.5 - 5.1 mmol/L   Chloride 114 (H) 98 -  111 mmol/L   CO2 27 22 - 32 mmol/L   Glucose, Bld 112 (H) 70 - 99 mg/dL   BUN 45 (H) 8 - 23 mg/dL   Creatinine, Ser 7.82 0.61 - 1.24 mg/dL   Calcium 7.8 (L) 8.9 - 10.3 mg/dL   GFR, Estimated >95 >62 mL/min   Anion gap 3 (L) 5 - 15  Glucose, capillary     Status: Abnormal   Collection Time: 02/05/23  7:08 AM  Result Value Ref Range   Glucose-Capillary 105 (H) 70 - 99 mg/dL    Assessment & Plan: The plan of care was discussed with the bedside nurse for the day, Kim E, who is in agreement with this plan and no additional concerns were raised.   Present on Admission:  Bowel obstruction (HCC)  Protein-calorie malnutrition, severe (HCC)  Duodenal obstruction    LOS: 23 days   Additional comments:I reviewed the patient's new clinical lab test results.   and I reviewed the patients new imaging test results.    13Y with massive GI bleeding secondary to small GJ ulceration with a pseudoaneurysm of the jejunal arcade   Duodenal obstruction and UGIB - S/P: 4/22 - Ex lap, adhesiolysis, duodenojejunal bypass, duodenostomy tube placement, T tube placement in  common bile duct. 4/23 - Takeback for bleeding with hemorrhagic shock, resection of ischemic transverse colon, left in discontinuity with Abthera 4/24 - Takeback for bleeding, abdominal washout, ileocecectomy, placement of Abthera 4/27 - Takeback, placement of J tube, end ileostomy, abdominal closure    Continue D tube and T tube to gravity Expected ileus Liquid meds only per J tube   Massive transfusion - Hb 12.6 and PLTs up to 93k   AKI - resolved Acute hypoxic ventilator dependent respiratory failure - extubated 4/30, doing well, but does have PNA FEN - TPN, hyponatremia resolved, continue diuresis today DVT - SCDs, LMWH Foley - remove today ID - resp cx with Pseudomonas, empiric zosyn for bowel ischemia, started 4/23 Dispo - 4NP when off dex, PT/OT.SLP  Diamantina Monks, MD Trauma & General Surgery Please use [email or web address] to  contact on call provider  02/05/2023  *Care during the described time interval was provided by me. I have reviewed this patient's available data, including medical history, events of note, physical examination and test results as part of my evaluation.

## 2023-02-05 DEATH — deceased

## 2023-02-06 LAB — PHOSPHORUS: Phosphorus: 4.5 mg/dL (ref 2.5–4.6)

## 2023-02-06 LAB — COMPREHENSIVE METABOLIC PANEL
ALT: 193 U/L — ABNORMAL HIGH (ref 0–44)
AST: 93 U/L — ABNORMAL HIGH (ref 15–41)
Albumin: 1.7 g/dL — ABNORMAL LOW (ref 3.5–5.0)
Alkaline Phosphatase: 100 U/L (ref 38–126)
Anion gap: 10 (ref 5–15)
BUN: 47 mg/dL — ABNORMAL HIGH (ref 8–23)
CO2: 28 mmol/L (ref 22–32)
Calcium: 8 mg/dL — ABNORMAL LOW (ref 8.9–10.3)
Chloride: 106 mmol/L (ref 98–111)
Creatinine, Ser: 1.07 mg/dL (ref 0.61–1.24)
GFR, Estimated: >60 mL/min (ref >60)
Glucose, Bld: 119 mg/dL — ABNORMAL HIGH (ref 70–99)
Potassium: 3.4 mmol/L — ABNORMAL LOW (ref 3.5–5.1)
Sodium: 144 mmol/L (ref 135–145)
Total Bilirubin: 15.6 mg/dL — ABNORMAL HIGH (ref 0.3–1.2)
Total Protein: 4.9 g/dL — ABNORMAL LOW (ref 6.5–8.1)

## 2023-02-06 LAB — CBC
HCT: 41.6 % (ref 39.0–52.0)
Hemoglobin: 13.3 g/dL (ref 13.0–17.0)
MCH: 29 pg (ref 26.0–34.0)
MCHC: 32 g/dL (ref 30.0–36.0)
MCV: 90.8 fL (ref 80.0–100.0)
Platelets: 193 10*3/uL (ref 150–400)
RBC: 4.58 MIL/uL (ref 4.22–5.81)
RDW: 19.8 % — ABNORMAL HIGH (ref 11.5–15.5)
WBC: 18.5 10*3/uL — ABNORMAL HIGH (ref 4.0–10.5)
nRBC: 0 % (ref 0.0–0.2)

## 2023-02-06 LAB — GLUCOSE, CAPILLARY
Glucose-Capillary: 116 mg/dL — ABNORMAL HIGH (ref 70–99)
Glucose-Capillary: 121 mg/dL — ABNORMAL HIGH (ref 70–99)

## 2023-02-06 LAB — BILIRUBIN, FRACTIONATED(TOT/DIR/INDIR)
Bilirubin, Direct: 9.4 mg/dL — ABNORMAL HIGH (ref 0.0–0.2)
Indirect Bilirubin: 5.7 mg/dL — ABNORMAL HIGH (ref 0.3–0.9)
Total Bilirubin: 15.1 mg/dL — ABNORMAL HIGH (ref 0.3–1.2)

## 2023-02-06 LAB — MAGNESIUM: Magnesium: 1.7 mg/dL (ref 1.7–2.4)

## 2023-02-06 MED ORDER — SODIUM CHLORIDE 0.9 % IV SOLN: INTRAVENOUS | Status: DC | PRN

## 2023-02-06 MED ORDER — MAGNESIUM SULFATE 2 GM/50ML IV SOLN
2.0000 g | Freq: Once | INTRAVENOUS | Status: AC
  Administered 2023-02-06: 2 g via INTRAVENOUS
  Filled 2023-02-06: qty 50

## 2023-02-06 MED ORDER — ZINC CHLORIDE 1 MG/ML IV SOLN
INTRAVENOUS | Status: AC
  Filled 2023-02-06: qty 1322.4

## 2023-02-06 MED ORDER — POTASSIUM CHLORIDE 10 MEQ/100ML IV SOLN
10.0000 meq | INTRAVENOUS | Status: AC
  Administered 2023-02-06 (×3): 10 meq via INTRAVENOUS
  Filled 2023-02-06 (×3): qty 100

## 2023-02-06 NOTE — Evaluation (Addendum)
Occupational Therapy Evaluation Patient Details Name: Bradley Dominguez MRN: [id number] DOB: 06/08/1956 Today's Date: 02/06/2023   History of Present Illness 67 yo M HF, chronic partial small bowel obstruction and multiple previous abd surgeries  who was admitted  4/8 from home w n/v/bloating. Underwent EGD 4/11 -- concerning for duodenal obstruction. Developed hemorrhagic shock 4/15 in the setting of hematemesis / hematochezia, decompensated requiring, intubation, embolization of jejunal arcade branch. Extubated 01/22/23. Underwent ex lap adhesiolysis, duodenojejunal bypass, duodenostomy tube placement, T tube placement in common bile duct 4/22, transferred to [facility] 4/23 with hemorrhagic shock, return to OR 4/23, 4/24, 4/25 and finally 4/27 with placement of J tube, end ileostomy, abdominal closure.  Extubated 4/30.   Clinical Impression   PTA pt lives at home independently with his wife, works and enjoys baseball. Able to progress EOB with mod A +2, stand x 2 with mod A + 2 then progress OOB to chair with use of Stedy. Max UV253 with RR in 40s briefly; end of session VSS on RA. Pt demonstrates a significant decline in functional status due to deficits listed below. Patient will benefit from intensive inpatient follow up therapy, >3 hours/day.  Wife contacted via phone after session to discuss progress made with OT/PT  - wife very appreciative and supportive. Acute OT to follow.     Recommendations for follow up therapy are one component of a multi-disciplinary discharge planning process, led by the attending physician.  Recommendations may be updated based on patient status, additional functional criteria and insurance authorization.   Assistance Recommended at Discharge Frequent or constant Supervision/Assistance  Patient can return home with the following      Functional Status Assessment  Patient has had a recent decline in their functional status and demonstrates the ability to make significant  improvements in function in a reasonable and predictable amount of time.  Equipment Recommendations  BSC/3in1    Recommendations for Other Services Rehab consult     Precautions / Restrictions Precautions Precautions: Fall Precaution Comments: NG suction, multiple lines, JP drain, biliary tube, ileostomy Restrictions Weight Bearing Restrictions: No      Mobility Bed Mobility Overal bed mobility: Needs Assistance Bed Mobility: Rolling, Sidelying to Sit, Sit to Sidelying Rolling: Mod assist, +2 for safety/equipment Sidelying to sit: +2 for safety/equipment, Mod assist            Transfers Overall transfer level: Needs assistance Equipment used: Rolling walker (2 wheels) Transfers: Sit to/from Stand, Bed to chair/wheelchair/BSC Sit to Stand: +2 physical assistance, Mod assist           General transfer comment: Able to stand and achieve knee extension with physical assist however unable to maintain due to weakness Transfer via Lift Equipment: Stedy    Balance Overall balance assessment: Needs assistance Sitting-balance support: Feet supported Sitting balance-Leahy Scale: Fair Sitting balance - Comments: able to sit EOB about 6-7 minutes with head and neck flexion encouraged upright posture, limited ability to extend wtih cervical stiffness     Standing balance-Leahy Scale: Poor                             ADL either performed or assessed with clinical judgement   ADL Overall ADL's : Needs assistance/impaired Eating/Feeding: NPO (ice chips) - fed self several ice chips; difficulty maintaining grasp on spoon due to weakness and UE edema   Grooming: Moderate assistance   Upper Body Bathing: Maximal assistance   Lower Body Bathing:  Maximal assistance;Bed level   Upper Body Dressing : Total assistance;Bed level   Lower Body Dressing: Total assistance       Toileting- Clothing Manipulation and Hygiene: Total assistance (ileostomy)        Functional mobility during ADLs: +2 for physical assistance;Maximal assistance;Cueing for safety;Cueing for sequencing;Rolling walker (2 wheels) (used Stedy for transfer)       Vision Baseline Vision/History: 1 Wears glasses Additional Comments: no glasses in room     Perception     Praxis      Pertinent Vitals/Pain Pain Assessment Pain Assessment: Faces Faces Pain Scale: Hurts even more Pain Location: generalized; abdomen -especially during supine<>sit Pain Descriptors / Indicators: Grimacing, Guarding, Discomfort Pain Intervention(s): Limited activity within patient's tolerance, Relaxation, Repositioned     Hand Dominance Right   Extremity/Trunk Assessment Upper Extremity Assessment Upper Extremity Assessment: Generalized weakness RUE Deficits / Details: strength @ 3+/5 throughout; stronger distally LUE Deficits / Details: AAROM grossly WFL; strength shoulder flexion 3-/5, elbow flexion 4/5, edema throughout forearms   Lower Extremity Assessment Lower Extremity Assessment: Defer to PT evaluation   Cervical / Trunk Assessment Cervical / Trunk Assessment: Other exceptions Cervical / Trunk Exceptions: cervical ROM tight in flexion and R rotation from prolonged positioning on vent/foward head; rounded posture due to abdominal surgery   Communication Communication Communication: Other (comment) (hoarse with NG tube, recent extubation)   Cognition Arousal/Alertness: Awake/alert Behavior During Therapy: Flat affect Overall Cognitive Status: Impaired/Different from baseline Area of Impairment: Orientation, Attention, Safety/judgement, Following commands, Problem solving, Memory, Awareness                 Orientation Level: Disoriented to, Place, Time, Situation Current Attention Level: Sustained Memory: Decreased short-term memory, Decreased recall of precautions Following Commands: Follows one step commands with increased time, Follows one step commands  consistently Safety/Judgement: Decreased awareness of safety, Decreased awareness of deficits Awareness: Intellectual Problem Solving: Slow processing, Decreased initiation, Difficulty sequencing, Requires verbal cues General Comments: improved cognition as compared to yesterday's PT session     General Comments       Exercises Exercises: Other exercises, General Upper Extremity General Exercises - Upper Extremity Shoulder Flexion: AAROM, Both, 5 reps Other Exercises Other Exercises: squeeze ball issued   Shoulder Instructions      Home Living Family/patient expects to be discharged to:: Private residence Living Arrangements: Spouse/significant other Available Help at Discharge: Family Type of Home: House Home Access: Stairs to enter Secretary/administrator of Steps: 3   Home Layout: Two level;Able to live on main level with bedroom/bathroom     Bathroom Shower/Tub: Producer, television/film/video: Standard     Home Equipment: None          Prior Functioning/Environment Prior Level of Function : Independent/Modified Independent;Driving;Working/employed             Mobility Comments: working Naval architect job; enjoys baseball          OT Problem List: Decreased strength;Decreased range of motion;Decreased activity tolerance;Impaired balance (sitting and/or standing);Decreased coordination;Decreased cognition;Decreased safety awareness;Decreased knowledge of use of DME or AE;Decreased knowledge of precautions;Cardiopulmonary status limiting activity;Impaired UE functional use;Pain      OT Treatment/Interventions: Self-care/ADL training;Therapeutic exercise;Energy conservation;Neuromuscular education;DME and/or AE instruction;Therapeutic activities;Cognitive remediation/compensation;Patient/family education;Balance training    OT Goals(Current goals can be found in the care plan section) Acute Rehab OT Goals Patient Stated Goal: to get better OT Goal Formulation:  With patient/family Time For Goal Achievement: 02/20/23 Potential to Achieve Goals: Good  OT Frequency:  Min 2X/week    Co-evaluation PT/OT/SLP Co-Evaluation/Treatment: Yes Reason for Co-Treatment: Complexity of the patient's impairments (multi-system involvement);For patient/therapist safety;To address functional/ADL transfers   OT goals addressed during session: ADL's and self-care;Strengthening/ROM      AM-PAC OT "6 Clicks" Daily Activity     Outcome Measure Help from another person eating meals?: Total (NPO) Help from another person taking care of personal grooming?: A Lot Help from another person toileting, which includes using toliet, bedpan, or urinal?: Total Help from another person bathing (including washing, rinsing, drying)?: A Lot Help from another person to put on and taking off regular upper body clothing?: A Lot Help from another person to put on and taking off regular lower body clothing?: Total 6 Click Score: 9   End of Session Equipment Utilized During Treatment: Gait belt;Rolling walker (2 wheels);Other (comment) (stedy) Nurse Communication: Mobility status;Need for lift equipment  Activity Tolerance: Patient tolerated treatment well Patient left: in chair;with call bell/phone within reach;with chair alarm set  OT Visit Diagnosis: Unsteadiness on feet (R26.81);Other abnormalities of gait and mobility (R26.89);Muscle weakness (generalized) (M62.81);Other symptoms and signs involving cognitive function;Pain Pain - part of body:  (abdomen)                Time: 1417-1500 OT Time Calculation (min): 43 min Charges:  OT General Charges $OT Visit: 1 Visit OT Evaluation $OT Eval High Complexity: 1 High  Karrin Eisenmenger, OT/L   Acute OT Clinical Specialist Acute Rehabilitation Services Pager [phone] Office [phone]   [place] 02/06/2023, 3:30 PM

## 2023-02-06 NOTE — Progress Notes (Signed)
Physical Therapy Treatment Patient Details Name: Bradley Dominguez MRN: [id number] DOB: Dec 31, 1955 Today's Date: 02/06/2023   History of Present Illness 67 yo M HF, chronic partial small bowel obstruction and multiple previous abd surgeries  who was admitted  4/8 from home w n/v/bloating. Underwent EGD 4/11 -- concerning for duodenal obstruction. Developed hemorrhagic shock 4/15 in the setting of hematemesis / hematochezia, decompensated requiring, intubation, embolization of jejunal arcade branch. Extubated 01/22/23. Underwent ex lap adhesiolysis, duodenojejunal bypass, duodenostomy tube placement, T tube placement in common bile duct 4/22, transferred to [facility] 4/23 with hemorrhagic shock, return to OR 4/23, 4/24, 4/25 and finally 4/27 with placement of J tube, end ileostomy, abdominal closure.  Extubated 4/30.    PT Comments    Patient progressing with mobility and able to initiate sit to stand and using lift equipment able to transition to recliner.  He remains confused and needing assist with hand over hand to use spoon to get ice chips.  HR up to 128 and RR into 40's with mobility, but stabilized once reclined in chair.  He will continue to benefit from skilled PT in the acute setting and from follow up intensive inpatient rehab at d/c.    Recommendations for follow up therapy are one component of a multi-disciplinary discharge planning process, led by the attending physician.  Recommendations may be updated based on patient status, additional functional criteria and insurance authorization.  Follow Up Recommendations       Assistance Recommended at Discharge Frequent or constant Supervision/Assistance  Patient can return home with the following Two people to help with walking and/or transfers;Assistance with cooking/housework;Direct supervision/assist for medications management;Assist for transportation;Help with stairs or ramp for entrance;Two people to help with bathing/dressing/bathroom    Equipment Recommendations  Other (comment) (TBA)    Recommendations for Other Services       Precautions / Restrictions Precautions Precautions: Fall Precaution Comments: NG suction, multiple lines, JP drain, biliary tube, ileostomy Restrictions Weight Bearing Restrictions: No     Mobility  Bed Mobility Overal bed mobility: Needs Assistance Bed Mobility: Rolling, Sidelying to Sit, Sit to Sidelying Rolling: Mod assist, +2 for safety/equipment Sidelying to sit: +2 for safety/equipment, Mod assist       General bed mobility comments: cue for technique using LE's to help to turn and assist to lift trunk    Transfers Overall transfer level: Needs assistance Equipment used: Rolling walker (2 wheels) Transfers: Sit to/from Stand, Bed to chair/wheelchair/BSC Sit to Stand: +2 physical assistance, Mod assist           General transfer comment: Able to stand and achieve knee extension with physical assist however unable to maintain due to weakness Transfer via Lift Equipment: Stedy  Ambulation/Gait                   Stairs             Wheelchair Mobility    Modified Rankin (Stroke Patients Only)       Balance Overall balance assessment: Needs assistance Sitting-balance support: Feet supported Sitting balance-Leahy Scale: Fair Sitting balance - Comments: able to sit EOB about 6-7 minutes with head and neck flexion encouraged upright posture, limited ability to extend wtih cervical stiffness     Standing balance-Leahy Scale: Poor Standing balance comment: UE support and mod A for balance                            Cognition Arousal/Alertness: Awake/alert  Behavior During Therapy: Flat affect Overall Cognitive Status: Impaired/Different from baseline Area of Impairment: Orientation, Attention, Safety/judgement, Following commands, Problem solving, Memory, Awareness                 Orientation Level: Disoriented to, Place, Time,  Situation Current Attention Level: Sustained Memory: Decreased short-term memory, Decreased recall of precautions Following Commands: Follows one step commands with increased time, Follows one step commands consistently Safety/Judgement: Decreased awareness of safety, Decreased awareness of deficits Awareness: Intellectual Problem Solving: Slow processing, Decreased initiation, Difficulty sequencing, Requires verbal cues          Exercises      General Comments General comments (skin integrity, edema, etc.): HR max 128, RR into 40's with mobility, RN aware, and down to 106 bpm and RR 27 after reclined in chair      Pertinent Vitals/Pain Pain Assessment Pain Assessment: Faces Faces Pain Scale: Hurts even more Pain Location: generalized; abdomen -especially during supine<>sit Pain Descriptors / Indicators: Grimacing, Guarding, Discomfort Pain Intervention(s): Monitored during session, Repositioned, Limited activity within patient's tolerance    Home Living Family/patient expects to be discharged to:: Private residence Living Arrangements: Spouse/significant other Available Help at Discharge: Family Type of Home: House Home Access: Stairs to enter   Secretary/administrator of Steps: 3   Home Layout: Two level;Able to live on main level with bedroom/bathroom Home Equipment: None      Prior Function            PT Goals (current goals can now be found in the care plan section) Progress towards PT goals: Progressing toward goals    Frequency    Min 3X/week      PT Plan Current plan remains appropriate    Co-evaluation PT/OT/SLP Co-Evaluation/Treatment: Yes Reason for Co-Treatment: Complexity of the patient's impairments (multi-system involvement);For patient/therapist safety;To address functional/ADL transfers PT goals addressed during session: Mobility/safety with mobility;Balance;Strengthening/ROM OT goals addressed during session: ADL's and  self-care;Strengthening/ROM      AM-PAC PT "6 Clicks" Mobility   Outcome Measure  Help needed turning from your back to your side while in a flat bed without using bedrails?: A Lot Help needed moving from lying on your back to sitting on the side of a flat bed without using bedrails?: A Lot Help needed moving to and from a bed to a chair (including a wheelchair)?: Total Help needed standing up from a chair using your arms (e.g., wheelchair or bedside chair)?: Total Help needed to walk in hospital room?: Total Help needed climbing 3-5 steps with a railing? : Total 6 Click Score: 8    End of Session Equipment Utilized During Treatment: Gait belt Activity Tolerance: Patient limited by fatigue Patient left: with call bell/phone within reach;in chair;with chair alarm set   PT Visit Diagnosis: Other abnormalities of gait and mobility (R26.89);Muscle weakness (generalized) (M62.81);Other symptoms and signs involving the nervous system (R29.898)     Time: 1417-1500 PT Time Calculation (min) (ACUTE ONLY): 43 min  Charges:  $Therapeutic Activity: 8-22 mins                     Sheran Lawless, PT Acute Rehabilitation Services Office:[phone] 02/06/2023    [name] 02/06/2023, 5:13 PM

## 2023-02-06 NOTE — Progress Notes (Signed)
PHARMACY - TOTAL PARENTERAL NUTRITION CONSULT NOTE  Indication: Duodenal obstruction with ileus  Patient Measurements: Height: 6\' 1"  (185.4 cm) Weight: 86.5 kg (190 lb 11.2 oz) IBW/kg (Calculated) : 79.9 TPN AdjBW (KG): 65.5 Body mass index is 25.16 kg/m.  Assessment:  12 yoM admitted on 4/8 with N/V, chronic partial SBO, history of multiple previous abdominal surgeries. Pt has very minimal PO intake with ongoing weight loss.  Per documentation, patient has severe PCM.  Pharmacy consulted to manage TPN.   Glucose / Insulin: no hx DM - CBGs well controlled, SSI d/c 4/30  Electrolytes: Na/CL stable at 144/106, FWF 50 Q6H, K 3.6 post Lasix , mag: 1.7 (max in TPN), phos: 4.5, others WNL Renal: SCr 1.07, BUN 47 Hepatic: 4/29 : LFTs elevated post-op 4/22 still decreasing, tbili up to 15.6 (slight yellowing of sclera), TG WNL, albumin 1.7 Intake / Output; MIVF: UOP 2.7 ml/kg/hr with Lasix, drain , net +15L, LBM 5/1  GI Imaging:  -4/4 UGI: Findings suggestive of chronic pSBO or ileus -4/9 CT: Ileus vs high grade partial SBO GI Surgeries / Procedures:  -4/11 EGD: 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 Placed biliary T-tube, duodenostomy tube - 4/22 2nd OR: ex lap with transverse colectomy, abd packed & left open w/ Abthera dressing - 4/24 OR: acute decompensation >> ex-lap with ileocectomy, temporary abd closure with neg pressure dressing.  MTP. - 4/25: ex-lap with washout, distal ileum resection, temp abd closure - 4/27 reopening of lap, washout and closure, ileostomy/J-tube placement   Central access: double lumen PICC placed 01/13/23 TPN start date: 01/14/23  Nutritional Goals: RD Estimated Needs Total Energy Estimated Needs: 2050-2250 Total Protein Estimated Needs: 130-145 grams Total Fluid Estimated  Needs: 2.1L/day  Current Nutrition:   TPN Ice chips   Plan:  Continue TPN at goal rate of 95 ml/hr to provide 132g AA and 2075 kCal, meeting 100% of needs Electrolytes in TPN: Na 35mEq/L,  K 35 mEq/L, Ca 10 mEq/L, Mg 9 mEq/L, decrease Phos 19mmol/L, Cl:Ac to 1:2.  3 runs KCL ordered per surgery, no diuresis ordered today. Made a small increase in K on 5/1 will trend.  Will give 2g of IV mag sulfate.  If further diuresis consider concentrating TPN.  Add standard MVI to TPN Remove multitrace d/t elevated tbili and add back zinc/chromium/selenium  Monitor TPN labs on Mon/Thurs, recheck in AM given K changes.  Monitor return of bowel function to initiate TF.   Estill Batten, PharmD, BCCCP  02/06/2023, 10:42 AM

## 2023-02-06 NOTE — Progress Notes (Signed)
Nutrition Follow-up  DOCUMENTATION CODES:   Severe malnutrition in context of chronic illness  INTERVENTION:   TPN to meet increased nutrition needs  50 ml free water every 6 hours per j-tube Total free water: 200 ml   Once bowel function returns recommend  Pivot 1.5 with goal rate of 65 ml/hr  Provides: 2340 kcal, 146 grams protein, and 1184 ml free water  NUTRITION DIAGNOSIS:   Severe Malnutrition related to chronic illness as evidenced by moderate fat depletion, severe muscle depletion, energy intake < or equal to 75% for > or equal to 1 month, percent weight loss. Ongoing.   GOAL:   Patient will meet greater than or equal to 90% of their needs Met with TPN at goal   MONITOR:   Diet advancement, Labs, Weight trends, I & O's (TPN)  REASON FOR ASSESSMENT:   Consult New TPN/TNA  ASSESSMENT:   67 yo male who was admitted from home with worsening nausea, vomiting, bloating and weakness. Patient with remote history of multiple prior laparotomies (in the 1970's), with a chronic partial small bowel obstruction.  Pt extubated, NPO until bowel fxn returns, SLP following.  Awaiting bowel function before starting enteral nutrition support via j tube   04/08: admitted, PICC line placed 04/09: TPN initiated 04/13: increased to goal TPN of 63mL/hr 04/14: emergent EGD; oozing gastric ulcer s/p epi and clips 04/15: IR for emergent embolization, Intubated 04/22: s/p ex lap, LOA, duodenojejunal bypass with biliary T-tube, duodenostomy tube 04/23: s/p second OR for ex lap with transverse colectomy, abd packed and open abd, left in discontinuity due to colonic ischemia, ligation of bleeding mesenteric vessels,  04/24: OR due to acute decompensation s/p ex lap with ileocectomy, temp abd closure, MTP (did not receive TPN due to contaminated port) 04/25 - s/p ex lap, washout, SBR (10 cm necrotic distal ileum), VAC  04/27 - s/p ileostomy, j-tube placement, abd closure  04/30 -  extubated   Medications reviewed and include: oxycodone via jtube, protonix IV Cipro 10 mEq KCl x 3  Labs reviewed:  K 3.4 Tbili 9 --> 11.9 --> 15.6  WBC 18.5    TPN via PICC @ 95 ml/hr  Provides: 2075 kcal and 132 g AA Standard MVI  Multitrace removed with zinc, chromium, and selenium added back  UOP: 5700 ml  NG: 300 ml T tube: 225 ml  L JP: 660 ml 16 F J-tube: 150 ml  24 F RUQ: 600 ml   +15.0 L   Admission weight: 68.1 kg  Current weight: 86.5 kg  Diet Order:   Diet Order             Diet NPO time specified Except for: Ice Chips  Diet effective now                   EDUCATION NEEDS:   Education needs have been addressed  Skin:  Skin Assessment: Reviewed RN Assessment  Last BM:  25 ml via new ileostomy  Height:   Ht Readings from Last 1 Encounters:  01/27/23 6\' 1"  (1.854 m)    Weight:   Wt Readings from Last 1 Encounters:  02/06/23 86.5 kg    BMI:  Body mass index is 25.16 kg/m.  Estimated Nutritional Needs:   Kcal:  2050-2250  Protein:  130-145 grams  Fluid:  2.1L/day  Estela Vinal P., RD, LDN, CNSC See AMiON for contact information

## 2023-02-06 NOTE — Progress Notes (Signed)
5 Days Post-Op  Subjective: Delirium improved. Afebrile, vitals stable. Tbili still trending up, remaining LFTs downtrending.   Objective: Vital signs in last 24 hours: Temp:  [97.8 F (36.6 C)-99.5 F (37.5 C)] 99.5 F (37.5 C) (05/02 0400) Pulse Rate:  [64-110] 95 (05/02 0700) Resp:  [20-28] 20 (05/02 0700) BP: (103-137)/(62-83) 125/81 (05/02 0700) SpO2:  [90 %-99 %] 97 % (05/02 0700) Arterial Line BP: (132-154)/(49-65) 142/57 (05/01 1300) Weight:  [86.5 kg] 86.5 kg (05/02 0500) Last BM Date : 01/30/23 (flexiseal)  Intake/Output from previous day: 05/01 0701 - 05/02 0700 In: 3180.2 [I.V.:2591.8; IV Piggyback:588.4] Out: 7660 [Urine:5700; Emesis/NG output:300; Drains:1635; Stool:25] Intake/Output this shift: No intake/output data recorded.  PE: General: NAD, jaundiced Neuro: alert, no focal deficits Resp: nonlabored respirations on nasal cannula CV: RRR Abdomen: soft, nondistended. Midline incision open at skin, clean and dry. LUQ JP with turbid serosanguinous fluid. RUQ: T tube with bilious fluid, duodenostomy to gravity draining bile-tinged succus, J tube capped. LLQ ileostomy is edematous and productive of bowel sweat but no stool or gas. Extremities: mild edema GU: external catheter, dark yellow urine   Lab Results:  Recent Labs    02/05/23 0500 02/06/23 0500  WBC 15.7* 18.5*  HGB 12.8* 13.3  HCT 39.3 41.6  PLT 133* 193   BMET Recent Labs    02/05/23 0500 02/06/23 0500  NA 144 144  K 3.9 3.4*  CL 114* 106  CO2 27 28  GLUCOSE 112* 119*  BUN 45* 47*  CREATININE 0.80 1.07  CALCIUM 7.8* 8.0*   PT/INR No results for input(s): "LABPROT", "INR" in the last 72 hours.  CMP     Component Value Date/Time   NA 144 02/06/2023 0500   K 3.4 (L) 02/06/2023 0500   CL 106 02/06/2023 0500   CO2 28 02/06/2023 0500   GLUCOSE 119 (H) 02/06/2023 0500   BUN 47 (H) 02/06/2023 0500   CREATININE 1.07 02/06/2023 0500   CALCIUM 8.0 (L) 02/06/2023 0500   PROT  4.9 (L) 02/06/2023 0500   ALBUMIN 1.7 (L) 02/06/2023 0500   AST 93 (H) 02/06/2023 0500   ALT 193 (H) 02/06/2023 0500   ALKPHOS 100 02/06/2023 0500   BILITOT 15.6 (H) 02/06/2023 0500   GFRNONAA >60 02/06/2023 0500   GFRAA >60 09/23/2017 0507   Lipase  No results found for: "LIPASE"     Studies/Results: No results found.     Assessment/Plan 67 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 4/22 - Ex lap, adhesiolysis, duodenojejunal bypass, duodenostomy tube placement, T tube placement in common bile duct. 4/23 - Takeback for bleeding with hemorrhagic shock, resection of ischemic transverse colon, left in discontinuity with Abthera 4/24 - Takeback for bleeding, abdominal washout, ileocecectomy, pack placement, placement of Abthera 4/25 - Takeback, resection of 10cm necrotic distal ileum, washout, removal of packs, placement of Abthera 4/27 - Takeback, placement of J tube, end ileostomy, abdominal closure  - Continue TPN at full strength - Elevated LFTs: Transaminases downtrending. Fractionated bilirubin pending. - Thrombocytopenia: Resolved - Keep T tube and D tube to gravity drainage. - Pain: oxycodone, prn dilaudid, scheduled IV robaxin - Agitation/delirium: Seroquel qhs, Klonopin BID, prn haldol. Delirium precautions. - ID: Pseudomonas pneumonia, resistant to Zosyn and cefepime. Ciprofloxacin started 5/1, plan for 7-day course. - FEN: NPO, ok for ice chips, TPN, expected ileus. Awaiting return of bowel function. - WOC following for ostomy care and teaching - PT/OT ordered. Anticipate rehab at discharge. - VTE: SCDs, lovenox - Dispo: ICU  I  discussed the plan of care for the day with the patient's nurse and his wife at bedside.   LOS: 24 days    Sophronia Simas, MD [facility], Hepatobiliary and Pancreatic Surgery 02/06/23 7:55 AM

## 2023-02-06 NOTE — Consult Note (Addendum)
WOC Nurse ostomy follow up Stoma type/location: LLQ ileostomy Stomal assessment/size: 2", slightly oval, edematous, budded  blood tinged liquid only at this time. Wife at bedside and participates in care Peristomal assessment: Intact  midline incision, Left flank drain in pouching field. Barrier is cut with a slit to work around this.  Treatment options for stomal/peristomal skin: barrier ring and 2 piece pouch Output blood tinged succus only Ostomy pouching: 2pc. 2 3/4" pouch with barrier ring  Education provided: Pouch change performed.  Wife able to cut barrier to fit and apply to pouch.  We demonstrate application, emptying and cleaning bottom of pouch.  We discuss showering and they are hopeful for a reversal when he is well. Patient spouse states her mother had a temporary ostomy years ago as well.  Enrolled patient in [facility] Start Discharge program: Yes will today.  Will follow.  Mike Gip MSN, RN, FNP-BC [facility]ON Wound, Ostomy, Continence Nurse [facility]patient [facility] [phone] Pager [phone]

## 2023-02-07 ENCOUNTER — Inpatient Hospital Stay (HOSPITAL_COMMUNITY): Payer: BC Managed Care – PPO

## 2023-02-07 DIAGNOSIS — K565 Intestinal adhesions [bands], unspecified as to partial versus complete obstruction: Secondary | ICD-10-CM | POA: Diagnosis not present

## 2023-02-07 DIAGNOSIS — R5381 Other malaise: Secondary | ICD-10-CM | POA: Diagnosis not present

## 2023-02-07 LAB — COMPREHENSIVE METABOLIC PANEL
ALT: 166 U/L — ABNORMAL HIGH (ref 0–44)
AST: 96 U/L — ABNORMAL HIGH (ref 15–41)
Albumin: 1.6 g/dL — ABNORMAL LOW (ref 3.5–5.0)
Alkaline Phosphatase: 107 U/L (ref 38–126)
Anion gap: 7 (ref 5–15)
BUN: 55 mg/dL — ABNORMAL HIGH (ref 8–23)
CO2: 26 mmol/L (ref 22–32)
Calcium: 8.4 mg/dL — ABNORMAL LOW (ref 8.9–10.3)
Chloride: 108 mmol/L (ref 98–111)
Creatinine, Ser: 1.33 mg/dL — ABNORMAL HIGH (ref 0.61–1.24)
GFR, Estimated: 59 mL/min — ABNORMAL LOW (ref >60)
Glucose, Bld: 175 mg/dL — ABNORMAL HIGH (ref 70–99)
Potassium: 5.2 mmol/L — ABNORMAL HIGH (ref 3.5–5.1)
Sodium: 141 mmol/L (ref 135–145)
Total Bilirubin: 16 mg/dL — ABNORMAL HIGH (ref 0.3–1.2)
Total Protein: 4.8 g/dL — ABNORMAL LOW (ref 6.5–8.1)

## 2023-02-07 LAB — CBC
HCT: 40.5 % (ref 39.0–52.0)
Hemoglobin: 12.8 g/dL — ABNORMAL LOW (ref 13.0–17.0)
MCH: 29.2 pg (ref 26.0–34.0)
MCHC: 31.6 g/dL (ref 30.0–36.0)
MCV: 92.3 fL (ref 80.0–100.0)
Platelets: 241 10*3/uL (ref 150–400)
RBC: 4.39 MIL/uL (ref 4.22–5.81)
RDW: 20.2 % — ABNORMAL HIGH (ref 11.5–15.5)
WBC: 18.8 10*3/uL — ABNORMAL HIGH (ref 4.0–10.5)
nRBC: 0 % (ref 0.0–0.2)

## 2023-02-07 LAB — BASIC METABOLIC PANEL
Anion gap: 9 (ref 5–15)
BUN: 56 mg/dL — ABNORMAL HIGH (ref 8–23)
CO2: 26 mmol/L (ref 22–32)
Calcium: 8.1 mg/dL — ABNORMAL LOW (ref 8.9–10.3)
Chloride: 107 mmol/L (ref 98–111)
Creatinine, Ser: 1.36 mg/dL — ABNORMAL HIGH (ref 0.61–1.24)
GFR, Estimated: 57 mL/min — ABNORMAL LOW (ref >60)
Glucose, Bld: 120 mg/dL — ABNORMAL HIGH (ref 70–99)
Potassium: 4.3 mmol/L (ref 3.5–5.1)
Sodium: 142 mmol/L (ref 135–145)

## 2023-02-07 LAB — GLUCOSE, CAPILLARY
Glucose-Capillary: 107 mg/dL — ABNORMAL HIGH (ref 70–99)
Glucose-Capillary: 104 mg/dL — ABNORMAL HIGH (ref 70–99)

## 2023-02-07 MED ORDER — ALBUMIN HUMAN 5 % IV SOLN
12.5000 g | Freq: Once | INTRAVENOUS | Status: AC
  Administered 2023-02-07: 12.5 g via INTRAVENOUS
  Filled 2023-02-07: qty 250

## 2023-02-07 MED ORDER — IOHEXOL 300 MG/ML  SOLN
25.0000 mL | Freq: Once | INTRAMUSCULAR | Status: AC | PRN
  Administered 2023-02-07: 25 mL

## 2023-02-07 MED ORDER — ORAL CARE MOUTH RINSE: 15.0000 mL | OROMUCOSAL | Status: DC | PRN

## 2023-02-07 MED ORDER — ZINC CHLORIDE 1 MG/ML IV SOLN
INTRAVENOUS | Status: AC
  Filled 2023-02-07: qty 1322.4

## 2023-02-07 NOTE — Consult Note (Signed)
Physical Medicine and Rehabilitation Consult Reason for Consult:debility, poor functional mobility Referring Physician: Freida Busman   HPI: Bradley Dominguez is a 67 y.o. 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 EGD was performed by Dr. Chales Abrahams which found a post-operative stomach with gastric and small bowel dilation and duodenal obstruction with non-functioning GJ bypass. He developed melena and hematemesis with hgb dropping to 4 on 4/15. Pt was intubated and an embolization of the jejunal arcade branch was performed for a hemorrhagic pseudoaneurysm by Dr. Loreta Ave. Pt stabilized and ultimately went to the OR on 4/22 for ex lap with lysis of adhesions, duodenojejunal bypass, common bile duct tube, and duodenostomy tube by Dr. Freida Busman. Pt developed further bleeding and pt went back to OR for exlap on 4/23 with ligation of bleeding vessels,  transverse colectomy, and temporary abdominal closure/vac placement. The patient returned yet again the following day to the OR after further bleeding with ileocecectomy/vac placement. On 4/27 Dr. Freida Busman performed an abdominal washout, placed a J-tube, end ileostomy, and closed the abdomen. Pt stabilized and has been able to mobilize with therapy. Yesterday he was mod assist for sit to stand transfers but unable to ambulate. He was max to total assist for ADL's.    Review of Systems  Constitutional:  Positive for malaise/fatigue and weight loss.  HENT:  Negative for hearing loss.   Eyes: Negative.   Respiratory:  Positive for cough and shortness of breath.   Cardiovascular:  Positive for leg swelling.  Gastrointestinal:  Positive for abdominal pain, blood in stool and melena.  Genitourinary:  Negative for dysuria.  Musculoskeletal:  Positive for myalgias.   Skin:  Negative for rash.  Neurological:  Positive for weakness.  Psychiatric/Behavioral:  Negative for hallucinations.    Past Medical History:  Diagnosis Date   Arthritis    HANDS   Bowel obstruction (HCC)    CHF (congestive heart failure) (HCC) 11/26/2018   Elevated brain natriuretic peptide (BNP) level 11/25/2018   Family history of adverse reaction to anesthesia    FATHER SLOW TO AWAKEN, MOTHER POST OP N/V   Frequent UTI    History of colon polyps 2009   History of shingles    Plantar fasciitis    Prostate hypertrophy    Vitamin B 12 deficiency    Past Surgical History:  Procedure Laterality Date   APPENDECTOMY     APPLICATION OF WOUND VAC  01/29/2023   Procedure: APPLICATION OF WOUND VAC;  Surgeon: Fritzi Mandes, MD;  Location: [facility] OR;  Service: General;;   BIOPSY  01/16/2023   Procedure: BIOPSY;  Surgeon: Lynann Bologna, MD;  Location: [facility];  Service: Gastroenterology;;   BOWEL RESECTION N/A 01/30/2023   Procedure: SMALL BOWEL RESECTION;  Surgeon: Fritzi Mandes, MD;  Location: [facility] OR;  Service: General;  Laterality: N/A;   CECOSTOMY N/A 01/29/2023   Procedure: Meliton Rattan;  Surgeon: Fritzi Mandes, MD;  Location: [facility] OR;  Service: General;  Laterality: N/A;   CHOLECYSTECTOMY     COLONSCOPY  AGE 73   WITH POLYP REMOVED   ESOPHAGOGASTRODUODENOSCOPY N/A 01/19/2023   Procedure: ESOPHAGOGASTRODUODENOSCOPY (EGD);  Surgeon: Lynann Bologna, MD;  Location: [facility];  Service: Gastroenterology;  Laterality: N/A;   ESOPHAGOGASTRODUODENOSCOPY (EGD) WITH PROPOFOL N/A 01/16/2023   Procedure: ESOPHAGOGASTRODUODENOSCOPY (EGD) WITH  PROPOFOL;  Surgeon: Lynann Bologna, MD;  Location: [facility];  Service: Gastroenterology;  Laterality: N/A;   GASTROSTOMY  02/01/2023   Procedure: INSERTION OF JEJUNOSTOMY TUBE;  Surgeon: Fritzi Mandes, MD;  Location: [facility] OR;  Service: General;;   HEMOSTASIS CLIP PLACEMENT  01/19/2023   Procedure: HEMOSTASIS CLIP PLACEMENT;  Surgeon: Lynann Bologna, MD;  Location: [facility];  Service: Gastroenterology;;   HEMOSTASIS CONTROL  01/19/2023   Procedure: HEMOSTASIS CONTROL;  Surgeon: Lynann Bologna, MD;  Location: [facility];  Service: Gastroenterology;;   IR ANGIOGRAM SELECTIVE EACH ADDITIONAL VESSEL  01/20/2023   IR ANGIOGRAM SELECTIVE EACH ADDITIONAL VESSEL  01/20/2023   IR ANGIOGRAM SELECTIVE EACH ADDITIONAL VESSEL  01/20/2023   IR ANGIOGRAM VISCERAL SELECTIVE  01/20/2023   IR ANGIOGRAM VISCERAL SELECTIVE  01/20/2023   IR CHEST FLUORO  01/20/2023   IR EMBO ART  VEN HEMORR LYMPH EXTRAV  INC GUIDE ROADMAPPING  01/20/2023   IR FLUORO GUIDE CV LINE RIGHT  01/20/2023   IR US GUIDE VASC ACCESS RIGHT  01/20/2023   IR US GUIDE VASC ACCESS RIGHT  01/20/2023   LAPAROSCOPIC ABDOMINAL EXPLORATION N/A 01/29/2023   Procedure: ReExploration Laparotomy, Removal of Abdominal Packing, Repacked Abdomen Packing;  Surgeon: Fritzi Mandes, MD;  Location: [facility];  Service: General;  Laterality: N/A;   LAPAROTOMY N/A 01/27/2023   Procedure: EXPLORATORY LAPAROTOMY, LYSIS OF ADHESIONS, DUODENOJEJUNAL BYPASS, PLACEMENT OF BILIARY T-TUBE, PLACEMENT OF DUODENOSTOMY TUBE;  Surgeon: Fritzi Mandes, MD;  Location: [facility];  Service: General;  Laterality: N/A;   LAPAROTOMY N/A 01/28/2023   Procedure: EXPLORATORY LAPAROTOMY, PARTIAL BOWEL RESECTION, AND ABTHERA WOUND VAC APPLICATION;  Surgeon: Fritzi Mandes, MD;  Location: [facility];  Service: General;  Laterality: N/A;  EIGHT 18X18 LAPS LEFT INSIDE WOUND SITE   LAPAROTOMY N/A 01/29/2023   Procedure: OPEN ABDOMINAL WASHOUT;  Surgeon: Fritzi Mandes, MD;  Location: [facility];  Service: General;  Laterality: N/A;   LAPAROTOMY N/A 01/30/2023   Procedure: REOPENING OF LAPAROTOMY, ABDOMINAL WASHOUT;  Surgeon: Fritzi Mandes, MD;  Location: [facility];  Service: General;  Laterality: N/A;   LAPAROTOMY N/A 02/01/2023   Procedure: RE-OPENING  OF LAPAROTOMY, ABDOMINAL WASHOUT, ABDOMINAL CLOSURE, ILEOSTOMY PLACEMENT;  Surgeon: Fritzi Mandes,  MD;  Location: [facility] OR;  Service: General;  Laterality: N/A;   SMALL INTESTINE SURGERY     1979   SUBMUCOSAL INJECTION  01/19/2023   Procedure: SUBMUCOSAL INJECTION;  Surgeon: Lynann Bologna, MD;  Location: [facility];  Service: Gastroenterology;;   TRANSURETHRAL RESECTION OF PROSTATE N/A 09/22/2017   Procedure: TRANSURETHRAL RESECTION OF THE PROSTATE (TURP);  Surgeon: Malen Gauze, MD;  Location: [facility];  Service: Urology;  Laterality: N/A;   Family History  Problem Relation Age of Onset   Breast cancer Mother    Heart attack Father    Diabetes Other    Colon cancer Neg Hx    Esophageal cancer Neg Hx    Stomach cancer Neg Hx    Colon polyps Neg Hx    Rectal cancer Neg Hx    Social History:  reports that he has never smoked. He has never used smokeless tobacco. He reports that he does not drink alcohol and does not use drugs. Allergies: No Known Allergies Medications Prior to Admission  Medication Sig Dispense Refill   acetaminophen (TYLENOL) 500 MG tablet Take 1,000 mg by mouth as needed for moderate pain.     aspirin-acetaminophen-caffeine (EXCEDRIN MIGRAINE) 250-250-65 MG tablet Take 1 tablet by  mouth as needed for headache or migraine.     cyanocobalamin 2000 MCG tablet Take 2,000 mcg by mouth daily.     Lactobacillus (FLORAJEN ACIDOPHILUS PO) Take 1 capsule by mouth daily.     ondansetron (ZOFRAN-ODT) 8 MG disintegrating tablet Take 8 mg by mouth every 8 (eight) hours as needed for vomiting or nausea.     pantoprazole (PROTONIX) 40 MG tablet Take by mouth. (Patient not taking: Reported on 01/14/2023)      Home: Home Living Family/patient expects to be discharged to:: Private residence Living Arrangements: Spouse/significant other Available Help at Discharge: Family Type of Home: House Home Access: Stairs to enter Secretary/administrator of Steps: 3 Home Layout: Two level, Able to live on main level with bedroom/bathroom Bathroom Shower/Tub: Architectural technologist: Standard Home Equipment: None  Functional History: Prior Function Prior Level of Function : Independent/Modified Independent, Driving, Working/employed Mobility Comments: working Naval architect job; enjoys baseball Functional Status:  Mobility: Bed Mobility Overal bed mobility: Needs Assistance Bed Mobility: Rolling, Sidelying to Sit, Sit to Sidelying Rolling: Mod assist, +2 for safety/equipment Sidelying to sit: +2 for safety/equipment, Mod assist Supine to sit: Supervision Sit to supine: Supervision Sit to sidelying: Mod assist General bed mobility comments: cue for technique using LE's to help to turn and assist to lift trunk Transfers Overall transfer level: Needs assistance Equipment used: Rolling walker (2 wheels) Transfers: Sit to/from Stand, Bed to chair/wheelchair/BSC Sit to Stand: +2 physical assistance, Mod assist Bed to/from chair/wheelchair/BSC transfer type:: Via Lift equipment Transfer via Lift Equipment: Stedy General transfer comment: Able to stand and achieve knee extension with physical assist however unable to maintain due to weakness      ADL: ADL Overall ADL's : Needs assistance/impaired Eating/Feeding: NPO (ice chips) Grooming: Moderate assistance Upper Body Bathing: Maximal assistance Lower Body Bathing: Maximal assistance, Bed level Upper Body Dressing : Total assistance, Bed level Lower Body Dressing: Total assistance Toileting- Clothing Manipulation and Hygiene: Total assistance (ileostomy) Functional mobility during ADLs: +2 for physical assistance, Maximal assistance, Cueing for safety, Cueing for sequencing, Rolling walker (2 wheels) (used Stedy for transfer)  Cognition: Cognition Overall Cognitive Status: Impaired/Different from baseline Orientation Level: Oriented to person, Oriented to place, Oriented to time, Oriented to situation Cognition Arousal/Alertness: Awake/alert Behavior During Therapy: Flat affect Overall  Cognitive Status: Impaired/Different from baseline Area of Impairment: Orientation, Attention, Safety/judgement, Following commands, Problem solving, Memory, Awareness Orientation Level: Disoriented to, Place, Time, Situation Current Attention Level: Sustained Memory: Decreased short-term memory, Decreased recall of precautions Following Commands: Follows one step commands with increased time, Follows one step commands consistently Safety/Judgement: Decreased awareness of safety, Decreased awareness of deficits Awareness: Intellectual Problem Solving: Slow processing, Decreased initiation, Difficulty sequencing, Requires verbal cues General Comments: improved cognition as compared to yesterday's PT session  Blood pressure 120/73, pulse 95, temperature 97.8 F (36.6 C), temperature source Axillary, resp. rate (!) 30, height 6\' 1"  (1.854 m), weight 81.9 kg, SpO2 93 %. Physical Exam Constitutional:      Comments: Appears fatigued. Eyes partially closed.  HENT:     Head: Normocephalic.     Nose: Nose normal.  Eyes:     General:        Right eye: No discharge.        Left eye: No discharge.  Cardiovascular:     Rate and Rhythm: Normal rate.  Pulmonary:     Effort: Pulmonary effort is normal.  Abdominal:     Tenderness: There is abdominal tenderness.  Comments: Abdominal incision, ostomy  Musculoskeletal:     Cervical back: Normal range of motion.     Right lower leg: Edema present.  Skin:    General: Skin is warm.  Neurological:     Comments: Pt somewhat lethargic. Followed basic commands. Fair insight and awareness. Normal language, soft voice. UE motor 3/5 prox to 4/5 distally. LLE 3-/5 to 3/5 prox to distal. RLE 2/5 prox to 3-/5 distally. Senses pain in all 4 limbs. DTR's 1+  Psychiatric:     Comments: Flat but cooperative     Results for orders placed or performed during the hospital encounter of 01/13/23 (from the past 24 hour(s))  Glucose, capillary     Status: Abnormal    Collection Time: 02/06/23  7:28 PM  Result Value Ref Range   Glucose-Capillary 121 (H) 70 - 99 mg/dL  Glucose, capillary     Status: Abnormal   Collection Time: 02/07/23  8:05 AM  Result Value Ref Range   Glucose-Capillary 107 (H) 70 - 99 mg/dL  CBC     Status: Abnormal   Collection Time: 02/07/23  8:09 AM  Result Value Ref Range   WBC 18.8 (H) 4.0 - 10.5 K/uL   RBC 4.39 4.22 - 5.81 MIL/uL   Hemoglobin 12.8 (L) 13.0 - 17.0 g/dL   HCT 01.0 27.2 - 53.6 %   MCV 92.3 80.0 - 100.0 fL   MCH 29.2 26.0 - 34.0 pg   MCHC 31.6 30.0 - 36.0 g/dL   RDW 64.4 (H) 03.4 - 74.2 %   Platelets 241 150 - 400 K/uL   nRBC 0.0 0.0 - 0.2 %  Comprehensive metabolic panel     Status: Abnormal   Collection Time: 02/07/23  8:09 AM  Result Value Ref Range   Sodium 141 135 - 145 mmol/L   Potassium 5.2 (H) 3.5 - 5.1 mmol/L   Chloride 108 98 - 111 mmol/L   CO2 26 22 - 32 mmol/L   Glucose, Bld 175 (H) 70 - 99 mg/dL   BUN 55 (H) 8 - 23 mg/dL   Creatinine, Ser 5.95 (H) 0.61 - 1.24 mg/dL   Calcium 8.4 (L) 8.9 - 10.3 mg/dL   Total Protein 4.8 (L) 6.5 - 8.1 g/dL   Albumin 1.6 (L) 3.5 - 5.0 g/dL   AST 96 (H) 15 - 41 U/L   ALT 166 (H) 0 - 44 U/L   Alkaline Phosphatase 107 38 - 126 U/L   Total Bilirubin 16.0 (H) 0.3 - 1.2 mg/dL   GFR, Estimated 59 (L) >60 mL/min   Anion gap 7 5 - 15   No results found.  Assessment/Plan: Diagnosis: 67 yo male with substantial debility related duodenal obstruction and massive bleeding requiring multiple surgeries and transfusions.   Does the need for close, 24 hr/day medical supervision in concert with the patient's rehab needs make it unreasonable for this patient to be served in a less intensive setting? Yes Co-Morbidities requiring supervision/potential complications:  -Acute blood loss anemia -nutrition/ostomy needs -wound care -hx of CHF Due to bladder management, bowel management, safety, skin/wound care, disease management, medication administration, pain management,  and patient education, does the patient require 24 hr/day rehab nursing? Yes Does the patient require coordinated care of a physician, rehab nurse, therapy disciplines of PT, OT to address physical and functional deficits in the context of the above medical diagnosis(es)? Yes Addressing deficits in the following areas: balance, endurance, locomotion, strength, transferring, bowel/bladder control, bathing, dressing, feeding, grooming, toileting, and psychosocial  support Can the patient actively participate in an intensive therapy program of at least 3 hrs of therapy per day at least 5 days per week? Yes and Potentially The potential for patient to make measurable gains while on inpatient rehab is excellent Anticipated functional outcomes upon discharge from inpatient rehab are supervision and min assist  with PT, supervision and min assist with OT, n/a with SLP. Estimated rehab length of stay to reach the above functional goals is: 14-20 days Anticipated discharge destination: Home Overall Rehab/Functional Prognosis: excellent  POST ACUTE RECOMMENDATIONS: This patient's condition is appropriate for continued rehabilitative care in the following setting: CIR Patient has agreed to participate in recommended program. Yes Note that insurance prior authorization may be required for reimbursement for recommended care.  Comment: Pt was independent prior to admission. Has great family supports and an accessible home. Rehab Admissions Coordinator to follow up    I have personally performed a face to face diagnostic evaluation of this patient. Additionally, I have examined the patient's medical record including any pertinent labs and radiographic images. If the physician assistant has documented in this note, I have reviewed and edited or otherwise concur with the physician assistant's documentation.  Thanks,  Ranelle Oyster, MD 02/07/2023

## 2023-02-07 NOTE — Progress Notes (Signed)
SLP Cancellation Note  Patient Details Name: Bradley Dominguez MRN: [id number] DOB: [birth year]   Cancelled treatment:    Checked in with RN - NG removed.  Not ready for thin liquid trials. Continue ice chips only. Will await notice from medical staff regarding readiness for diet advancement.  [name] L. [name], MA CCC/SLP Clinical Specialist - Acute Care SLP Acute Rehabilitation Services Office number [phone]        [name] 02/07/2023, 12:56 PM

## 2023-02-07 NOTE — Progress Notes (Signed)
Inpatient Rehab Admissions:  Inpatient Rehab Consult received.  I met with patient and wife Bradley Dominguez at the bedside for rehabilitation assessment and to discuss goals and expectations of an inpatient rehab admission.  Pt was lethargic d/t pain medication so spoke with wife. Discussed average length of stay, insurance authorization requirement, discharge home after completion of CIR. Bradley Dominguez acknowledged understanding. She is interested in pt pursuing CIR. She confirmed that she and family will be able to provide 24/7 support for pt after discharge. Will continue to follow.  Signed: Wolfgang Phoenix, MS, CCC-SLP Admissions Coordinator [phone]

## 2023-02-07 NOTE — Progress Notes (Signed)
6 Days Post-Op  Subjective: No acute changes. Got out of bed to chair yesterday. Vitals stable.    Objective: Vital signs in last 24 hours: Temp:  [97.8 F (36.6 C)-98.9 F (37.2 C)] 97.9 F (36.6 C) (05/03 0400) Pulse Rate:  [85-192] 85 (05/03 0700) Resp:  [15-34] 15 (05/03 0700) BP: (98-129)/(63-91) 108/64 (05/03 0700) SpO2:  [93 %-100 %] 96 % (05/03 0700) Weight:  [81.9 kg] 81.9 kg (05/03 0500) Last BM Date : 01/30/23 (flexiseal)  Intake/Output from previous day: 05/02 0701 - 05/03 0700 In: 3317.8 [I.V.:2467.8; NG/GT:50; IV Piggyback:750] Out: 3350 [Urine:1850; Emesis/NG output:50; Drains:1450] Intake/Output this shift: No intake/output data recorded.  PE: General: NAD, jaundiced Neuro: alert, no focal deficits Resp: nonlabored respirations on nasal cannula HEENT: NG with gastric contents CV: RRR Abdomen: soft, nondistended. Midline incision open at skin, clean and dry. LUQ JP with turbid fluid. RUQ: T tube with bilious fluid, duodenostomy to gravity draining bile-tinged succus, J tube capped. LLQ ileostomy is pink and edematous, no stool or gas in bag. Extremities: warm and well-perfused, edema improved GU: external catheter, dark yellow urine   Lab Results:  Recent Labs    02/05/23 0500 02/06/23 0500  WBC 15.7* 18.5*  HGB 12.8* 13.3  HCT 39.3 41.6  PLT 133* 193   BMET Recent Labs    02/05/23 0500 02/06/23 0500  NA 144 144  K 3.9 3.4*  CL 114* 106  CO2 27 28  GLUCOSE 112* 119*  BUN 45* 47*  CREATININE 0.80 1.07  CALCIUM 7.8* 8.0*   PT/INR No results for input(s): "LABPROT", "INR" in the last 72 hours.  CMP     Component Value Date/Time   NA 144 02/06/2023 0500   K 3.4 (L) 02/06/2023 0500   CL 106 02/06/2023 0500   CO2 28 02/06/2023 0500   GLUCOSE 119 (H) 02/06/2023 0500   BUN 47 (H) 02/06/2023 0500   CREATININE 1.07 02/06/2023 0500   CALCIUM 8.0 (L) 02/06/2023 0500   PROT 4.9 (L) 02/06/2023 0500   ALBUMIN 1.7 (L) 02/06/2023 0500    AST 93 (H) 02/06/2023 0500   ALT 193 (H) 02/06/2023 0500   ALKPHOS 100 02/06/2023 0500   BILITOT 15.1 (H) 02/06/2023 0900   GFRNONAA >60 02/06/2023 0500   GFRAA >60 09/23/2017 0507   Lipase  No results found for: "LIPASE"     Studies/Results: No results found.     Assessment/Plan 67 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 4/22 - Ex lap, adhesiolysis, duodenojejunal bypass, duodenostomy tube placement, T tube placement in common bile duct. 4/23 - Takeback for bleeding with hemorrhagic shock, resection of ischemic transverse colon, left in discontinuity with Abthera 4/24 - Takeback for bleeding, abdominal washout, ileocecectomy, pack placement, placement of Abthera 4/25 - Takeback, resection of 10cm necrotic distal ileum, washout, removal of packs, placement of Abthera 4/27 - Takeback, placement of J tube, end ileostomy, abdominal closure  - Continue TPN at full strength - Elevated LFTs: Labs pending this morning. If bilirubin continues trending up, will request cholangiogram via T tube. - Remove NG tube (output is minimal, and D tube is functioning to control GI secretions) - Keep T tube and D tube to gravity drainage. - Pain: oxycodone, prn dilaudid, scheduled IV robaxin - Agitation/delirium: Improving. Continue Seroquel qhs, Klonopin BID, prn haldol. Delirium precautions. - ID: Pseudomonas pneumonia, resistant to Zosyn and cefepime. Ciprofloxacin started 5/1, plan for 7-day course. If WBC continues trending up, plan for CT scan this weekend to evaluate for undrained  fluid collections given known duodenal leak. - FEN: NPO, ok for ice chips, TPN, expected ileus. Awaiting return of bowel function. - WOC following for ostomy care and teaching - PT/OT following. Anticipate rehab at discharge. - VTE: SCDs, lovenox - Dispo: ICU  I discussed the plan of care for the day with the patient's nurse and his wife at bedside.   LOS: 25 days    Sophronia Simas, MD [facility]  Surgery General, Hepatobiliary and Pancreatic Surgery 02/07/23 7:46 AM

## 2023-02-07 NOTE — Progress Notes (Addendum)
PHARMACY - TOTAL PARENTERAL NUTRITION CONSULT NOTE  Indication: Duodenal obstruction with ileus  Patient Measurements: Height: 6\' 1"  (185.4 cm) Weight: 81.9 kg (180 lb 8.9 oz) IBW/kg (Calculated) : 79.9 TPN AdjBW (KG): 65.5 Body mass index is 23.82 kg/m.  Assessment:  67 yoM admitted on 4/8 with N/V, chronic partial SBO, history of multiple previous abdominal surgeries. Pt has very minimal PO intake with ongoing weight loss.  Per documentation, patient has severe PCM.  Pharmacy consulted to manage TPN.   Glucose / Insulin: no hx DM - CBGs well controlled, SSI d/c 4/30  All readings < 180, note 1x elevation of 175  Electrolytes: Na/CL stable at 141/108, FWF 50 Q6H d/c on 5/3 after discussion with CCS, K 5.2 with hemolysis repeat K: 4.3 s/p 3 runs on 5/2, CoCa: 10.3 no diuresis today  5/2: mag: 1.7 (supplemented 5/1), phos: 4.5 (reduced on 5/2)  Renal: SCr 1.33, BUN 55 Hepatic: 4/29 : LFTs elevated post-op 4/22 still decreasing, tbili up to 16.0 (slight yellowing of sclera, surgery to order cholangiogram), t-bili has been uptrending since 4/22, hard to discern if TPN is contributing, TG WNL, albumin 1.6 Intake / Output; MIVF: UOP 0.9 ml/kg/hr with Lasix, drain , net +15L, LBM 5/1 per nursing  GI Imaging:  -4/4 UGI: Findings suggestive of chronic pSBO or ileus -4/9 CT: Ileus vs high grade partial SBO GI Surgeries / Procedures:  -4/11 EGD: 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 Placed biliary T-tube, duodenostomy tube - 4/22 2nd OR: ex lap with transverse colectomy, abd packed & left open w/ Abthera dressing - 4/24 OR: acute decompensation >> ex-lap with ileocectomy, temporary abd closure with neg pressure dressing.  MTP. - 4/25: ex-lap with washout, distal ileum resection, temp abd closure - 4/27 reopening of  lap, washout and closure, ileostomy/J-tube placement   Central access: double lumen PICC placed 01/13/23 TPN start date: 01/14/23  Nutritional Goals: RD Estimated Needs Total Energy Estimated Needs: 2050-2250 Total Protein Estimated Needs: 130-145 grams Total Fluid Estimated Needs: 2.1L/day  Current Nutrition:   TPN Ice chips   Plan:  Continue TPN at goal rate of 95 ml/hr to provide 132g AA and 2075 kCal, meeting 100% of needs Electrolytes in TPN: Na 57mEq/L, K 35 mEq/L, reduce Ca 5 mEq/L, Mg 9 mEq/L, Phos 65mmol/L, Cl:Ac to 1:2.  If further diuresis consider concentrating TPN.  Consider cycling TPN pending findings of cholangiogram, if no other causes would recommend to switch to cycling to mediate any TPN related impact.  Add standard MVI to TPN Remove multitrace d/t elevated tbili and add back zinc/chromium/selenium  Monitor TPN labs on Mon/Thurs, recheck in AM given K changes will also assess mag and phos.  Monitor return of bowel function to initiate TF.   Estill Batten, PharmD, BCCCP  02/07/2023, 10:46 AM

## 2023-02-08 LAB — BASIC METABOLIC PANEL
Anion gap: 6 (ref 5–15)
BUN: 45 mg/dL — ABNORMAL HIGH (ref 8–23)
CO2: 26 mmol/L (ref 22–32)
Calcium: 7.8 mg/dL — ABNORMAL LOW (ref 8.9–10.3)
Chloride: 107 mmol/L (ref 98–111)
Creatinine, Ser: 0.91 mg/dL (ref 0.61–1.24)
GFR, Estimated: >60 mL/min (ref >60)
Glucose, Bld: 108 mg/dL — ABNORMAL HIGH (ref 70–99)
Potassium: 4.4 mmol/L (ref 3.5–5.1)
Sodium: 139 mmol/L (ref 135–145)

## 2023-02-08 LAB — MAGNESIUM: Magnesium: 1.9 mg/dL (ref 1.7–2.4)

## 2023-02-08 LAB — CBC
HCT: 36.9 % — ABNORMAL LOW (ref 39.0–52.0)
Hemoglobin: 12.3 g/dL — ABNORMAL LOW (ref 13.0–17.0)
MCH: 29.6 pg (ref 26.0–34.0)
MCHC: 33.3 g/dL (ref 30.0–36.0)
MCV: 88.9 fL (ref 80.0–100.0)
Platelets: 242 10*3/uL (ref 150–400)
RBC: 4.15 MIL/uL — ABNORMAL LOW (ref 4.22–5.81)
RDW: 20 % — ABNORMAL HIGH (ref 11.5–15.5)
WBC: 14.7 10*3/uL — ABNORMAL HIGH (ref 4.0–10.5)
nRBC: 0 % (ref 0.0–0.2)

## 2023-02-08 LAB — GLUCOSE, CAPILLARY
Glucose-Capillary: 103 mg/dL — ABNORMAL HIGH (ref 70–99)
Glucose-Capillary: 110 mg/dL — ABNORMAL HIGH (ref 70–99)

## 2023-02-08 LAB — PHOSPHORUS: Phosphorus: 3.6 mg/dL (ref 2.5–4.6)

## 2023-02-08 MED ORDER — ZINC CHLORIDE 1 MG/ML IV SOLN
INTRAVENOUS | Status: AC
  Filled 2023-02-08: qty 1317.6

## 2023-02-08 NOTE — Progress Notes (Signed)
7 Days Post-Op   Chief Complaint/Subjective: Foley replaced yesterday, no new pains today  Objective: Vital signs in last 24 hours: Temp:  [97.9 F (36.6 C)-99.6 F (37.6 C)] 99.6 F (37.6 C) (05/04 0400) Pulse Rate:  [76-109] 95 (05/04 0600) Resp:  [16-30] 22 (05/04 0600) BP: (100-123)/(66-75) 119/73 (05/04 0600) SpO2:  [93 %-97 %] 95 % (05/04 0600) Weight:  [76.5 kg] 76.5 kg (05/04 0500) Last BM Date : 01/30/23 (flexiseal) Intake/Output from previous day: 05/03 0701 - 05/04 0700 In: 2991.5 [I.V.:2291.4; NG/GT:50; IV Piggyback:650.1] Out: 4098 [Urine:4075; Drains:1530]  PE: Gen: NAD Resp: nonlabored Card: RRR Abd: soft, d tube and t tube with biliary output, JP with thick yellow fluid, wound with skin open and base intact  Lab Results:  Recent Labs    02/07/23 0809 02/08/23 0421  WBC 18.8* 14.7*  HGB 12.8* 12.3*  HCT 40.5 36.9*  PLT 241 242   Recent Labs    02/07/23 1026 02/08/23 0421  NA 142 139  K 4.3 4.4  CL 107 107  CO2 26 26  GLUCOSE 120* 108*  BUN 56* 45*  CREATININE 1.36* 0.91  CALCIUM 8.1* 7.8*   No results for input(s): "LABPROT", "INR" in the last 72 hours.    Component Value Date/Time   NA 139 02/08/2023 0421   K 4.4 02/08/2023 0421   CL 107 02/08/2023 0421   CO2 26 02/08/2023 0421   GLUCOSE 108 (H) 02/08/2023 0421   BUN 45 (H) 02/08/2023 0421   CREATININE 0.91 02/08/2023 0421   CALCIUM 7.8 (L) 02/08/2023 0421   PROT 4.8 (L) 02/07/2023 0809   ALBUMIN 1.6 (L) 02/07/2023 0809   AST 96 (H) 02/07/2023 0809   ALT 166 (H) 02/07/2023 0809   ALKPHOS 107 02/07/2023 0809   BILITOT 16.0 (H) 02/07/2023 0809   GFRNONAA >60 02/08/2023 0421   GFRAA >60 09/23/2017 0507    Assessment/Plan 67 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 4/22 - Ex lap, adhesiolysis, duodenojejunal bypass, duodenostomy tube placement, T tube placement in common bile duct. 4/23 - Takeback for bleeding with hemorrhagic shock, resection of ischemic transverse colon, left in  discontinuity with Abthera 4/24 - Takeback for bleeding, abdominal washout, ileocecectomy, pack placement, placement of Abthera 4/25 - Takeback, resection of 10cm necrotic distal ileum, washout, removal of packs, placement of Abthera 4/27 - Takeback, placement of J tube, end ileostomy, abdominal closure   - Continue TPN at full strength - Elevated LFTs: Labs pending this morning. If bilirubin continues trending up, will request cholangiogram via T tube. - NG removed 5/3 - Keep T tube and D tube to gravity drainage. - Pain: oxycodone, prn dilaudid, scheduled IV robaxin - Agitation/delirium: Improving. Continue Seroquel qhs, Klonopin BID, prn haldol. Delirium precautions. - postrenal ARF - foley replaced 5/3 with normalization of creatinine 0.9 from 1.3 - ID: Pseudomonas pneumonia, resistant to Zosyn and cefepime. Ciprofloxacin started 5/1, plan for 7-day course. If WBC continues trending up, plan for CT scan this weekend to evaluate for undrained fluid collections given known duodenal leak. - FEN: NPO, ok for ice chips, TPN, expected ileus. Awaiting return of bowel function. - WOC following for ostomy care and teaching - PT/OT following. Anticipate rehab at discharge. - VTE: SCDs, lovenox - Dispo: ICU   LOS: 26 days   I reviewed last 24 h vitals and pain scores, last 48 h intake and output, last 24 h labs and trends, and last 24 h imaging results.  Discussed CT findings and labs with family and patient.  This care required high  level of medical decision making.   De Blanch [facility] 02/08/2023, 8:57 AM Please see Amion for pager number during day hours 7:00am-4:30pm or 7:00am -11:30am on weekends

## 2023-02-08 NOTE — Progress Notes (Signed)
PHARMACY - TOTAL PARENTERAL NUTRITION CONSULT NOTE  Indication: Duodenal obstruction with ileus  Patient Measurements: Height: 6\' 1"  (185.4 cm) Weight: 76.5 kg (168 lb 10.4 oz) IBW/kg (Calculated) : 79.9 TPN AdjBW (KG): 65.5 Body mass index is 22.25 kg/m.  Assessment:  39 yoM admitted on 4/8 with N/V, chronic partial SBO, history of multiple previous abdominal surgeries. Pt has very minimal PO intake with ongoing weight loss.  Per documentation, patient has severe PCM.  Pharmacy consulted to manage TPN.   NG tube removed 5/3. Continue to await return of bowel function.  Glucose / Insulin: no hx DM - CBGs <140, SSI d/c'd 4/30  Electrolytes: K 4.4 (goal >/= 4 with ileus), Mg 1.9 (goal >/= 2 with ileus), all others WNL Renal: SCr 0.91, BUN 45 Hepatic: 5/3: LFTs elevated post-op 4/22 and trending down to 96/166, tbili up to 16.0 (slight yellowing of sclera, surgery to order cholangiogram), t-bili has been uptrending since 4/22, hard to discern if TPN is contributing, TG WNL, albumin 1.6 Intake / Output; MIVF: UOP 2.2 ml/kg/hr, drain , net +12.7L, LBM 5/1 per nursing  GI Imaging:  -4/4 UGI: Findings suggestive of chronic pSBO or ileus -4/9 CT: Ileus vs high grade partial SBO -5/3 cholangiogram: probable leaking at T-tube insertion site -5/3: CT A/P: focal low-density collection in R lower abdomen measuring 5.4 cm, distended urinary bladder w/ hydronephrosis GI Surgeries / Procedures:  -4/11 EGD: 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 Placed biliary T-tube, duodenostomy tube - 4/22 2nd OR: ex lap with transverse colectomy, abd packed & left open w/ Abthera dressing - 4/24 OR: acute decompensation >> ex-lap with ileocectomy, temporary abd closure with neg pressure dressing.  MTP. - 4/25: ex-lap with washout,  distal ileum resection, temp abd closure - 4/27 reopening of lap, washout and closure, ileostomy/J-tube placement   Central access: double lumen PICC placed 01/13/23 TPN start date: 01/14/23  Nutritional Goals: RD Estimated Needs Total Energy Estimated Needs: 2050-2250 Total Protein Estimated Needs: 130-145 grams Total Fluid Estimated Needs: 2.1L/day  Current Nutrition:   TPN Ice chips   Plan:  Change to cyclic TPN over 18 hours. At 1800, start TPN at 64 ml/hr for 1 hr, then increase infusion rate at 19:00 to 127 ml/hr for 16 hrs, then decrease infusion rate at 11:00 to 64 ml/hr for 1 hr, then stop TPN at 12:00 on the following day. (GIR 1.81-3.6 mg/kg/min)  TPN will provide 132g AA and 2086 kCal, meeting 100% of needs Electrolytes in TPN: Na 71mEq/L, K 37 mEq/L, Ca 6 mEq/L, Mg 10 mEq/L, Phos 55mmol/L, Cl:Ac 1:2.  If further diuresis consider concentrating TPN.  Add standard MVI to TPN Remove multitrace d/t elevated tbili and add back zinc/chromium/selenium  Monitor TPN labs daily until stable on cyclic TPN, then on Mon/Thurs Monitor return of bowel function to initiate TF    Wilburn Cornelia, PharmD, BCPS Clinical Pharmacist 02/08/2023 12:09 PM   Please refer to [facility] for pharmacy phone number

## 2023-02-09 LAB — BASIC METABOLIC PANEL
Anion gap: 7 (ref 5–15)
BUN: 38 mg/dL — ABNORMAL HIGH (ref 8–23)
CO2: 24 mmol/L (ref 22–32)
Calcium: 7.6 mg/dL — ABNORMAL LOW (ref 8.9–10.3)
Chloride: 103 mmol/L (ref 98–111)
Creatinine, Ser: 0.73 mg/dL (ref 0.61–1.24)
GFR, Estimated: >60 mL/min (ref >60)
Glucose, Bld: 114 mg/dL — ABNORMAL HIGH (ref 70–99)
Potassium: 4.6 mmol/L (ref 3.5–5.1)
Sodium: 134 mmol/L — ABNORMAL LOW (ref 135–145)

## 2023-02-09 LAB — CBC
HCT: 37.1 % — ABNORMAL LOW (ref 39.0–52.0)
Hemoglobin: 12.3 g/dL — ABNORMAL LOW (ref 13.0–17.0)
MCH: 29.6 pg (ref 26.0–34.0)
MCHC: 33.2 g/dL (ref 30.0–36.0)
MCV: 89.2 fL (ref 80.0–100.0)
Platelets: 277 10*3/uL (ref 150–400)
RBC: 4.16 MIL/uL — ABNORMAL LOW (ref 4.22–5.81)
RDW: 20.4 % — ABNORMAL HIGH (ref 11.5–15.5)
WBC: 14.3 10*3/uL — ABNORMAL HIGH (ref 4.0–10.5)
nRBC: 0 % (ref 0.0–0.2)

## 2023-02-09 LAB — HEPATIC FUNCTION PANEL
ALT: 174 U/L — ABNORMAL HIGH (ref 0–44)
AST: 141 U/L — ABNORMAL HIGH (ref 15–41)
Albumin: 1.7 g/dL — ABNORMAL LOW (ref 3.5–5.0)
Alkaline Phosphatase: 105 U/L (ref 38–126)
Bilirubin, Direct: 12.9 mg/dL — ABNORMAL HIGH (ref 0.0–0.2)
Indirect Bilirubin: 6.6 mg/dL — ABNORMAL HIGH (ref 0.3–0.9)
Total Bilirubin: 19.5 mg/dL (ref 0.3–1.2)
Total Protein: 5.4 g/dL — ABNORMAL LOW (ref 6.5–8.1)

## 2023-02-09 LAB — GLUCOSE, CAPILLARY
Glucose-Capillary: 101 mg/dL — ABNORMAL HIGH (ref 70–99)
Glucose-Capillary: 101 mg/dL — ABNORMAL HIGH (ref 70–99)
Glucose-Capillary: 116 mg/dL — ABNORMAL HIGH (ref 70–99)
Glucose-Capillary: 71 mg/dL (ref 70–99)
Glucose-Capillary: 76 mg/dL (ref 70–99)
Glucose-Capillary: 92 mg/dL (ref 70–99)

## 2023-02-09 LAB — PHOSPHORUS: Phosphorus: 3.6 mg/dL (ref 2.5–4.6)

## 2023-02-09 LAB — MAGNESIUM: Magnesium: 1.8 mg/dL (ref 1.7–2.4)

## 2023-02-09 MED ORDER — ZINC CHLORIDE 1 MG/ML IV SOLN
INTRAVENOUS | Status: AC
  Filled 2023-02-09: qty 1317.6

## 2023-02-09 NOTE — Progress Notes (Signed)
Follow up - Trauma and Critical Care  Patient Details:    Bradley Dominguez is an 67 y.o. male.  Anti-infectives:  Anti-infectives (From admission, onward)    Start     Dose/Rate Route Frequency Ordered Stop   02/05/23 1345  ciprofloxacin (CIPRO) IVPB 400 mg        400 mg 200 mL/hr over 60 Minutes Intravenous Every 12 hours 02/05/23 1259 02/12/23 0959   01/28/23 1000  piperacillin-tazobactam (ZOSYN) IVPB 3.375 g  Status:  Discontinued        3.375 g 12.5 mL/hr over 240 Minutes Intravenous Every 8 hours 01/28/23 0902 02/05/23 1259   01/27/23 1030  ceFAZolin (ANCEF) IVPB 2g/100 mL premix       See Hyperspace for full Linked Orders Report.   2 g 200 mL/hr over 30 Minutes Intravenous On call to O.R. 01/27/23 0746 01/27/23 1135   01/27/23 1030  metroNIDAZOLE (FLAGYL) IVPB 500 mg       See Hyperspace for full Linked Orders Report.   500 mg 100 mL/hr over 60 Minutes Intravenous On call to O.R. 01/27/23 0746 01/27/23 1141   01/19/23 0600  cefTRIAXone (ROCEPHIN) 2 g in sodium chloride 0.9 % 100 mL IVPB       See Hyperspace for full Linked Orders Report.   2 g 200 mL/hr over 30 Minutes Intravenous On call to O.R. 01/19/23 0350 01/19/23 0606   01/19/23 0350  metroNIDAZOLE (FLAGYL) IVPB 500 mg  Status:  Discontinued       See Hyperspace for full Linked Orders Report.   500 mg 100 mL/hr over 60 Minutes Intravenous Every 8 hours 01/19/23 0350 01/19/23 0752       Consults:    Chief Complaint/Subjective:    Overnight Issues: Able to get into sitting position yesterday, drainage of blake more thin red compared to gray thick yesterday  Objective:  Vital signs for last 24 hours: Temp:  [97.6 F (36.4 C)-99.9 F (37.7 C)] 98.9 F (37.2 C) (05/05 0359) Pulse Rate:  [83-101] 94 (05/05 0800) Resp:  [15-23] 19 (05/05 0800) BP: (100-119)/(60-75) 109/71 (05/05 0800) SpO2:  [94 %-99 %] 98 % (05/05 0800) FiO2 (%):  [40 %] 40 % (05/04 2000) Weight:  [76.2 kg] 76.2 kg (05/05  0500)  Hemodynamic parameters for last 24 hours:    Intake/Output from previous day: 05/04 0701 - 05/05 0700 In: 3523.3 [I.V.:3063.2; IV Piggyback:400.1] Out: 4785 [Urine:2800; Drains:1985]   Vent settings for last 24 hours: FiO2 (%):  [40 %] 40 %  Physical Exam:  Gen: NAD HEENT: atraumatic, EOMI icteric Resp: nonlabored Cardiovascular: RRR Abdomen: soft, blake drain with thin red drainage, t bue and d tube with bilious output, ostomy pink with small amount thin liquid but no gas Ext: no edema, +jaundice Neuro: AOx4   Assessment/Plan:   67 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 4/22 - Ex lap, adhesiolysis, duodenojejunal bypass, duodenostomy tube placement, T tube placement in common bile duct. 4/23 - Takeback for bleeding with hemorrhagic shock, resection of ischemic transverse colon, left in discontinuity with Abthera 4/24 - Takeback for bleeding, abdominal washout, ileocecectomy, pack placement, placement of Abthera 4/25 - Takeback, resection of 10cm necrotic distal ileum, washout, removal of packs, placement of Abthera 4/27 - Takeback, placement of J tube, end ileostomy, abdominal closure   - Continue TPN at full strength - Elevated LFTs: Labs pending this morning. If bilirubin continues trending up, will request cholangiogram via T tube. - NG removed 5/3 - Keep T tube and D tube to  gravity drainage. - Pain: oxycodone, prn dilaudid, scheduled IV robaxin - Agitation/delirium: Improving. Continue Seroquel qhs, Klonopin BID, prn haldol. Delirium precautions. - postrenal ARF - foley replaced 5/3 with normalization of creatinine 0.9 from 1.3 - ID: Pseudomonas pneumonia, resistant to Zosyn and cefepime. Ciprofloxacin started 5/1, plan for 7-day course. If WBC continues trending up, plan for CT scan this weekend to evaluate for undrained fluid collections given known duodenal leak. - FEN: NPO, ok for ice chips, TPN, expected ileus. Awaiting return of bowel function. - WOC following  for ostomy care and teaching - PT/OT following. Anticipate rehab at discharge. - VTE: SCDs, lovenox - Dispo: ICU   LOS: 27 days    Critical Care Total Time*: 15 Minutes  De Blanch Liliyana Thobe 02/09/2023  *Care during the described time interval was provided by me and/or other providers on the critical care team.  I have reviewed this patient's available data, including medical history, events of note, physical examination and test results as part of my evaluation.

## 2023-02-09 NOTE — Progress Notes (Signed)
Date and time results received: 02/09/23 1450 (use smartphrase ".now" to insert current time)  Test: total bilirubin Critical Value: 19.5  Name of Provider Notified: Dr. Sheliah Hatch  Orders Received? Or Actions Taken?:  No new orders given

## 2023-02-09 NOTE — Progress Notes (Signed)
PHARMACY - TOTAL PARENTERAL NUTRITION CONSULT NOTE  Indication: Duodenal obstruction with ileus  Patient Measurements: Height: 6\' 1"  (185.4 cm) Weight: 76.2 kg (167 lb 15.9 oz) IBW/kg (Calculated) : 79.9 TPN AdjBW (KG): 65.5 Body mass index is 22.16 kg/m.  Assessment:  35 yoM admitted on 4/8 with N/V, chronic partial SBO, history of multiple previous abdominal surgeries. Pt has very minimal PO intake with ongoing weight loss.  Per documentation, patient has severe PCM.  Pharmacy consulted to manage TPN.   NG tube removed 5/3. Continue to await return of bowel function. Patient tolerated transition to cyclic TPN on 5/4.  Glucose / Insulin: no hx DM - CBGs <140, SSI d/c'd 4/30  Electrolytes: Na 134, K 4.6 (goal >/= 4 with ileus), Mg 1.8 (goal >/= 2 with ileus), all others WNL Renal: SCr 0.73, BUN 38 Hepatic: 5/3: LFTs elevated post-op 4/22 and trending down to 96/166, tbili up to 16.0 (slight yellowing of sclera, surgery to order cholangiogram), t-bili has been uptrending since 4/22, hard to discern if TPN is contributing, TG WNL, albumin 1.6 Intake / Output; MIVF: UOP 1.5 ml/kg/hr, drain , net +10.1L, LBM 5/1 per nursing  GI Imaging:  -4/4 UGI: Findings suggestive of chronic pSBO or ileus -4/9 CT: Ileus vs high grade partial SBO -5/3 cholangiogram: probable leaking at T-tube insertion site -5/3: CT A/P: focal low-density collection in R lower abdomen measuring 5.4 cm, distended urinary bladder w/ hydronephrosis GI Surgeries / Procedures:  -4/11 EGD: 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 Placed biliary T-tube, duodenostomy tube - 4/22 2nd OR: ex lap with transverse colectomy, abd packed & left open w/ Abthera dressing - 4/24 OR: acute decompensation >> ex-lap with ileocectomy, temporary abd closure with neg  pressure dressing.  MTP. - 4/25: ex-lap with washout, distal ileum resection, temp abd closure - 4/27 reopening of lap, washout and closure, ileostomy/J-tube placement   Central access: double lumen PICC placed 01/13/23 TPN start date: 01/14/23  Nutritional Goals: RD Estimated Needs Total Energy Estimated Needs: 2050-2250 Total Protein Estimated Needs: 130-145 grams Total Fluid Estimated Needs: 2.1L/day  Current Nutrition:   TPN Ice chips   Plan:  Change to cyclic TPN over 12 hours. At 1800, start TPN at 98 ml/hr for 1 hr, then increase infusion rate at 19:00 to 196 ml/hr for 10 hrs, then decrease infusion rate at 05:00 to 98 ml/hr for 1 hr, then stop TPN at 06:00 on the following day. (GIR 2.79-5.57 mg/kg/min)  TPN will provide 132g AA and 2086 kCal, meeting 100% of needs Electrolytes in TPN: Na increased 34mEq/L, K decreased to 28 mEq/L, Ca 6 mEq/L, Mg 10 mEq/L, Phos 90mmol/L, Cl:Ac 1:2.  Add standard MVI to TPN Remove multitrace d/t elevated tbili and add back zinc/chromium/selenium  Monitor CBGs QID on cyclic TPN Monitor TPN labs daily until stable on cyclic TPN, then on Mon/Thurs Monitor return of bowel function to initiate TF    Wilburn Cornelia, PharmD, BCPS Clinical Pharmacist 02/09/2023 9:01 AM   Please refer to [facility] for pharmacy phone number

## 2023-02-10 LAB — COMPREHENSIVE METABOLIC PANEL
ALT: 173 U/L — ABNORMAL HIGH (ref 0–44)
AST: 148 U/L — ABNORMAL HIGH (ref 15–41)
Albumin: 1.5 g/dL — ABNORMAL LOW (ref 3.5–5.0)
Alkaline Phosphatase: 100 U/L (ref 38–126)
Anion gap: 9 (ref 5–15)
BUN: 38 mg/dL — ABNORMAL HIGH (ref 8–23)
CO2: 21 mmol/L — ABNORMAL LOW (ref 22–32)
Calcium: 7.2 mg/dL — ABNORMAL LOW (ref 8.9–10.3)
Chloride: 102 mmol/L (ref 98–111)
Creatinine, Ser: 0.71 mg/dL (ref 0.61–1.24)
GFR, Estimated: >60 mL/min (ref >60)
Glucose, Bld: 122 mg/dL — ABNORMAL HIGH (ref 70–99)
Potassium: 4.5 mmol/L (ref 3.5–5.1)
Sodium: 132 mmol/L — ABNORMAL LOW (ref 135–145)
Total Bilirubin: 20.9 mg/dL (ref 0.3–1.2)
Total Protein: 5.1 g/dL — ABNORMAL LOW (ref 6.5–8.1)

## 2023-02-10 LAB — CBC
HCT: 36.7 % — ABNORMAL LOW (ref 39.0–52.0)
Hemoglobin: 12 g/dL — ABNORMAL LOW (ref 13.0–17.0)
MCH: 29.6 pg (ref 26.0–34.0)
MCHC: 32.7 g/dL (ref 30.0–36.0)
MCV: 90.6 fL (ref 80.0–100.0)
Platelets: 290 10*3/uL (ref 150–400)
RBC: 4.05 MIL/uL — ABNORMAL LOW (ref 4.22–5.81)
RDW: 20.1 % — ABNORMAL HIGH (ref 11.5–15.5)
WBC: 13.9 10*3/uL — ABNORMAL HIGH (ref 4.0–10.5)
nRBC: 0 % (ref 0.0–0.2)

## 2023-02-10 LAB — GLUCOSE, CAPILLARY
Glucose-Capillary: 130 mg/dL — ABNORMAL HIGH (ref 70–99)
Glucose-Capillary: 109 mg/dL — ABNORMAL HIGH (ref 70–99)
Glucose-Capillary: 113 mg/dL — ABNORMAL HIGH (ref 70–99)
Glucose-Capillary: 92 mg/dL (ref 70–99)
Glucose-Capillary: 72 mg/dL (ref 70–99)
Glucose-Capillary: 74 mg/dL (ref 70–99)

## 2023-02-10 LAB — TRIGLYCERIDES: Triglycerides: 139 mg/dL (ref <150)

## 2023-02-10 LAB — MAGNESIUM: Magnesium: 1.7 mg/dL (ref 1.7–2.4)

## 2023-02-10 LAB — PHOSPHORUS: Phosphorus: 3.3 mg/dL (ref 2.5–4.6)

## 2023-02-10 MED ORDER — DEXTROSE-NACL 5-0.45 % IV SOLN: INTRAVENOUS | Status: DC

## 2023-02-10 MED ORDER — DAKINS (1/4 STRENGTH) 0.125 % EX SOLN
Freq: Two times a day (BID) | CUTANEOUS | Status: AC
  Filled 2023-02-10: qty 473

## 2023-02-10 NOTE — Progress Notes (Signed)
Physical Therapy Treatment Patient Details Name: Bradley Dominguez MRN: [id number] DOB: 16-Jul-1956 Today's Date: 02/10/2023   History of Present Illness 67 yo M HF, chronic partial small bowel obstruction and multiple previous abd surgeries  who was admitted  4/8 from home w n/v/bloating. Underwent EGD 4/11 -- concerning for duodenal obstruction. Developed hemorrhagic shock 4/15 in the setting of hematemesis / hematochezia, decompensated requiring, intubation, embolization of jejunal arcade branch. Extubated 01/22/23. Underwent ex lap adhesiolysis, duodenojejunal bypass, duodenostomy tube placement, T tube placement in common bile duct 4/22, transferred to [facility] 4/23 with hemorrhagic shock, return to OR 4/23, 4/24, 4/25 and finally 4/27 with placement of J tube, end ileostomy, abdominal closure.  Extubated 4/30.    PT Comments    Patient progressing with mobility needing less assistance to come up to sit EOB and for sit to stand.  Still using Stedy for up to chair, though feel next session could try stepping with RW.  Patient remains confused, though responding well to commands and seems eager to progress.  PT will continue to follow.  Will need intensive inpatient rehab prior to d/c home with family support.    Recommendations for follow up therapy are one component of a multi-disciplinary discharge planning process, led by the attending physician.  Recommendations may be updated based on patient status, additional functional criteria and insurance authorization.  Follow Up Recommendations       Assistance Recommended at Discharge Frequent or constant Supervision/Assistance  Patient can return home with the following Two people to help with walking and/or transfers;Assistance with cooking/housework;Direct supervision/assist for medications management;Assist for transportation;Help with stairs or ramp for entrance;Two people to help with bathing/dressing/bathroom   Equipment Recommendations  Other  (comment) (TBA)    Recommendations for Other Services       Precautions / Restrictions Precautions Precautions: Fall Precaution Comments: multiple lines, JP drain, biliary tube, ileostomy; abdominal incision Restrictions Weight Bearing Restrictions: No     Mobility  Bed Mobility Overal bed mobility: Needs Assistance Bed Mobility: Rolling, Sidelying to Sit, Sit to Sidelying Rolling: Mod assist, +2 for safety/equipment Sidelying to sit: +2 for safety/equipment, Mod assist       General bed mobility comments: cue for technique using LE's to help to turn and assist to lift trunk    Transfers Overall transfer level: Needs assistance   Transfers: Sit to/from Stand, Bed to chair/wheelchair/BSC Sit to Stand: +2 physical assistance, Mod assist, Min assist           General transfer comment: pulled up on Stedy to stand with min to mod A of 2 with cues then transitioned to recliner with Stedy. Transfer via Lift Equipment: Stedy  Ambulation/Gait                   Stairs             Wheelchair Mobility    Modified Rankin (Stroke Patients Only)       Balance Overall balance assessment: Needs assistance   Sitting balance-Leahy Scale: Fair Sitting balance - Comments: able to sit EOB about 6-7 minutes with head and neck flexion encouraged upright posture, limited ability to extend wtih cervical stiffness     Standing balance-Leahy Scale: Poor Standing balance comment: UE support and mod A for balance                            Cognition Arousal/Alertness: Awake/alert Behavior During Therapy: Flat affect Overall Cognitive Status: Impaired/Different  from baseline Area of Impairment: Orientation, Attention, Safety/judgement, Following commands, Problem solving, Memory, Awareness                 Orientation Level: Disoriented to, Place, Time, Situation Current Attention Level: Selective Memory: Decreased short-term memory, Decreased  recall of precautions Following Commands: Follows one step commands consistently Safety/Judgement: Decreased awareness of safety, Decreased awareness of deficits Awareness: Emergent   General Comments: improved since last session however not at baseline        Exercises General Exercises - Lower Extremity Ankle Circles/Pumps: AROM, 5 reps, Seated, Both Long Arc Quad: AROM, 5 reps, Seated, Both Hip Flexion/Marching: AROM, 5 reps, Seated, Both    General Comments General comments (skin integrity, edema, etc.): HR max 122, RR 30's with transition to recliner.  Recovers when resting in chair.      Pertinent Vitals/Pain Pain Assessment Pain Assessment: Faces Faces Pain Scale: Hurts even more Pain Location: Abdomen; B feet Pain Descriptors / Indicators: Grimacing, Guarding, Discomfort Pain Intervention(s): Monitored during session, Limited activity within patient's tolerance, Premedicated before session    Home Living                          Prior Function            PT Goals (current goals can now be found in the care plan section) Progress towards PT goals: Progressing toward goals    Frequency    Min 3X/week      PT Plan Current plan remains appropriate    Co-evaluation PT/OT/SLP Co-Evaluation/Treatment: Yes Reason for Co-Treatment: Complexity of the patient's impairments (multi-system involvement);For patient/therapist safety;To address functional/ADL transfers PT goals addressed during session: Mobility/safety with mobility;Balance;Strengthening/ROM OT goals addressed during session: ADL's and self-care;Strengthening/ROM      AM-PAC PT "6 Clicks" Mobility   Outcome Measure  Help needed turning from your back to your side while in a flat bed without using bedrails?: A Lot Help needed moving from lying on your back to sitting on the side of a flat bed without using bedrails?: A Lot Help needed moving to and from a bed to a chair (including a  wheelchair)?: Total Help needed standing up from a chair using your arms (e.g., wheelchair or bedside chair)?: A Lot Help needed to walk in hospital room?: Total Help needed climbing 3-5 steps with a railing? : Total 6 Click Score: 9    End of Session Equipment Utilized During Treatment: Gait belt Activity Tolerance: Patient tolerated treatment well Patient left: with call bell/phone within reach;in chair;with chair alarm set   PT Visit Diagnosis: Other abnormalities of gait and mobility (R26.89);Muscle weakness (generalized) (M62.81);Other symptoms and signs involving the nervous system (R29.898)     Time: 4098-1191 PT Time Calculation (min) (ACUTE ONLY): 38 min  Charges:  $Therapeutic Activity: 8-22 mins                     Sheran Lawless, PT Acute Rehabilitation Services Office:[phone] 02/10/2023    [name] 02/10/2023, 6:27 PM

## 2023-02-10 NOTE — Consult Note (Signed)
WOC Nurse ostomy follow up Stoma type/location: LLQ colostomy, less edematous today Stomal assessment/size: 1 1/2"  barrier cut off center to accommodate midline incison Peristomal assessment: intact, Left flank drain. Treatment options for stomal/peristomal skin: barrier ring and 2 piece pouch Output none at this time.  Ostomy pouching: 2pc. 2 1/4" pouch with barrier ring Education provided:  wife at bedside. Patient is alert, but minimally participative. Wife's mother had ostomy, so she is somewhat familiar.  Patient wants to take an active role once he is stronger.  Supplies in room.  Enrolled patient in [place] Secure Start Discharge program: Yes Previously Will follow.   Mike Gip MSN, RN, FNP-BC CWON Wound, Ostomy, Continence Nurse [facility] [phone] Pager [phone]

## 2023-02-10 NOTE — Progress Notes (Signed)
Inpatient Rehab Admissions Coordinator:  Saw pt and wife at bedside. Pt not medically stable for potential CIR admission. Will continue to follow.   Wolfgang Phoenix, MS, CCC-SLP Admissions Coordinator [phone]

## 2023-02-10 NOTE — Progress Notes (Signed)
Occupational Therapy Treatment Patient Details Name: Bradley Dominguez MRN: [id number] DOB: 20-Sep-1956 Today's Date: 02/10/2023   History of present illness 67 yo M HF, chronic partial small bowel obstruction and multiple previous abd surgeries  who was admitted  4/8 from home w n/v/bloating. Underwent EGD 4/11 -- concerning for duodenal obstruction. Developed hemorrhagic shock 4/15 in the setting of hematemesis / hematochezia, decompensated requiring, intubation, embolization of jejunal arcade branch. Extubated 01/22/23. Underwent ex lap adhesiolysis, duodenojejunal bypass, duodenostomy tube placement, T tube placement in common bile duct 4/22, transferred to [facility] 4/23 with hemorrhagic shock, return to OR 4/23, 4/24, 4/25 and finally 4/27 with placement of J tube, end ileostomy, abdominal closure.  Extubated 4/30.   OT comments  Excellent session today. Pt seen as cotreat to advance mobility. Used Stedy again this session however pt required min A +2 to stand and progress OOB to chair. Once seated, pt participated in ADL tasks as noted below. Tolerance for activity significantly improved this session; VSS on RA. Feel pt can progress to begin use of mobility and ADL @ RW level next session. Patient will benefit from intensive inpatient follow up therapy, >3 hours/day. Will follow.     Recommendations for follow up therapy are one component of a multi-disciplinary discharge planning process, led by the attending physician.  Recommendations may be updated based on patient status, additional functional criteria and insurance authorization.    Assistance Recommended at Discharge Frequent or constant Supervision/Assistance  Patient can return home with the following      Equipment Recommendations  BSC/3in1    Recommendations for Other Services Rehab consult    Precautions / Restrictions Precautions Precautions: Fall Precaution Comments: multiple lines, JP drain, biliary tube, ileostomy; abdominal  incision Restrictions Weight Bearing Restrictions: No       Mobility Bed Mobility Overal bed mobility: Needs Assistance Bed Mobility: Rolling, Sidelying to Sit, Sit to Sidelying Rolling: Mod assist, +2 for safety/equipment Sidelying to sit: +2 for safety/equipment, Mod assist       General bed mobility comments: cue for technique using LE's to help to turn and assist to lift trunk    Transfers Overall transfer level: Needs assistance Equipment used: Rolling walker (2 wheels) Transfers: Sit to/from Stand, Bed to chair/wheelchair/BSC Sit to Stand: +2 physical assistance, Mod assist             Transfer via Lift Equipment: Stedy   Balance Overall balance assessment: Needs assistance Sitting-balance support: Feet supported Sitting balance-Leahy Scale: Fair Sitting balance - Comments: able to sit EOB about 6-7 minutes with head and neck flexion encouraged upright posture, limited ability to extend wtih cervical stiffness     Standing balance-Leahy Scale: Poor Standing balance comment: UE support and mod A for balance                           ADL either performed or assessed with clinical judgement   ADL Overall ADL's : Needs assistance/impaired Eating/Feeding: NPO (ice chips)   Grooming: Minimal assistance Grooming Details (indicate cue type and reason): able to wash face adn complete oral care with toothette Upper Body Bathing: Moderate assistance   Lower Body Bathing: Maximal assistance;Bed level   Upper Body Dressing : Bed level;Maximal assistance   Lower Body Dressing: Total assistance       Toileting- Clothing Manipulation and Hygiene: Total assistance (ileostomy)       Functional mobility during ADLs: +2 for physical assistance;Cueing for safety;Cueing for sequencing;Moderate assistance (  used [employer] for transfer)      Extremity/Trunk Assessment Upper Extremity Assessment Upper Extremity Assessment: Generalized weakness RUE Deficits /  Details: using BUE functionally LUE Deficits / Details: improved ROM and strength BUE   Lower Extremity Assessment Lower Extremity Assessment: Defer to PT evaluation        Vision   Additional Comments: wears glasses; donned at end of session - pt stated his vision was   Perception     Praxis      Cognition Arousal/Alertness: Awake/alert Behavior During Therapy: Flat affect Overall Cognitive Status: Impaired/Different from baseline Area of Impairment: Orientation, Attention, Safety/judgement, Following commands, Problem solving, Memory, Awareness                 Orientation Level: Disoriented to, Place, Time, Situation Current Attention Level: Selective Memory: Decreased short-term memory, Decreased recall of precautions Following Commands: Follows one step commands consistently Safety/Judgement: Decreased awareness of safety, Decreased awareness of deficits Awareness: Emergent Problem Solving: Slow processing, Decreased initiation, Difficulty sequencing, Requires verbal cues General Comments: improved since last session however not at baseline        Exercises Exercises: Other exercises, General Upper Extremity General Exercises - Upper Extremity Shoulder Flexion: AAROM, Both, 5 reps Shoulder ABduction: Strengthening, Both, 10 reps, Theraband Theraband Level (Shoulder Abduction): Level 1 (Yellow) Elbow Flexion: Strengthening, Both, 10 reps, Seated, Theraband Theraband Level (Elbow Flexion): Level 1 (Yellow) Elbow Extension: Strengthening, Both, 10 reps, Seated, Theraband Theraband Level (Elbow Extension): Level 1 (Yellow) Other Exercises Other Exercises: squeeze ball x 10 B hands    Shoulder Instructions       General Comments      Pertinent Vitals/ Pain       Pain Assessment Pain Assessment: Faces Faces Pain Scale: Hurts even more Pain Location: Abdomen; B feet Pain Descriptors / Indicators: Grimacing, Guarding, Discomfort Pain Intervention(s):  Limited activity within patient's tolerance, Premedicated before session  Home Living                                          Prior Functioning/Environment              Frequency  Min 2X/week        Progress Toward Goals  OT Goals(current goals can now be found in the care plan section)  Progress towards OT goals: Progressing toward goals  Acute Rehab OT Goals Patient Stated Goal: to get stronger OT Goal Formulation: With patient/family Time For Goal Achievement: 02/20/23 Potential to Achieve Goals: Good ADL Goals Pt Will Perform Eating: with min assist Pt Will Perform Grooming: with supervision;with set-up;sitting Pt Will Perform Upper Body Bathing: with min assist;sitting Pt Will Perform Lower Body Bathing: with mod assist;bed level Pt Will Transfer to Toilet: bedside commode;with mod assist;stand pivot transfer  Plan Discharge plan remains appropriate    Co-evaluation    PT/OT/SLP Co-Evaluation/Treatment: Yes Reason for Co-Treatment: Complexity of the patient's impairments (multi-system involvement);For patient/therapist safety;To address functional/ADL transfers PT goals addressed during session: Mobility/safety with mobility;Balance;Strengthening/ROM OT goals addressed during session: ADL's and self-care;Strengthening/ROM      AM-PAC OT "6 Clicks" Daily Activity     Outcome Measure   Help from another person eating meals?: Total (NPO) Help from another person taking care of personal grooming?: A Little Help from another person toileting, which includes using toliet, bedpan, or urinal?: Total Help from another person bathing (including washing, rinsing, drying)?: A  Lot Help from another person to put on and taking off regular upper body clothing?: A Lot Help from another person to put on and taking off regular lower body clothing?: Total 6 Click Score: 10    End of Session Equipment Utilized During Treatment: Gait belt;Other (comment)  (stedy)  OT Visit Diagnosis: Unsteadiness on feet (R26.81);Other abnormalities of gait and mobility (R26.89);Muscle weakness (generalized) (M62.81);Other symptoms and signs involving cognitive function;Pain Pain - Right/Left:  (B) Pain - part of body:  (abdomen; feet)   Activity Tolerance Patient tolerated treatment well   Patient Left in chair;with call bell/phone within reach;with chair alarm set   Nurse Communication Mobility status;Need for lift equipment        Time: [phone] OT Time Calculation (min): 42 min  Charges: OT General Charges $OT Visit: 1 Visit OT Treatments $Self Care/Home Management : 8-22 mins  Luisa Dago, OT/L   Acute OT Clinical Specialist Acute Rehabilitation Services Pager [phone] Office [phone]   [facility] 02/10/2023, 4:54 PM

## 2023-02-10 NOTE — Progress Notes (Signed)
9 Days Post-Op  Subjective: Afebrile, vitals stable. Tbili up to 20. Transaminases stable, alk phos normal. No ileostomy function yet.    Objective: Vital signs in last 24 hours: Temp:  [98.1 F (36.7 C)-99 F (37.2 C)] 98.2 F (36.8 C) (05/06 0400) Pulse Rate:  [82-102] 98 (05/06 0500) Resp:  [16-25] 23 (05/06 0500) BP: (92-125)/(46-74) 95/54 (05/06 0500) SpO2:  [95 %-100 %] 97 % (05/06 0500) Last BM Date : 01/30/23 (flexiseal)  Intake/Output from previous day: 05/05 0701 - 05/06 0700 In: 1065.1 [I.V.:865.1; IV Piggyback:200] Out: 4400 [Urine:2725; Drains:1675] Intake/Output this shift: Total I/O In: 225.4 [I.V.:225.4] Out: 2890 [Urine:2025; Drains:865]  PE: General: NAD, jaundiced Neuro: alert, no focal deficits Resp: nonlabored respirations on room air CV: RRR Abdomen: soft, nondistended. Midline incision open at skin, clean and dry, slight greenish tinge of wound. LUQ JP with turbid fluid. RUQ: T tube with bilious fluid, duodenostomy to gravity draining bile-tinged succus, J tube capped. LLQ ileostomy is pink and edematous, no stool or gas in bag. Extremities: warm and well-perfused, edema improved GU: Foley draining dark urine   Lab Results:  Recent Labs    02/09/23 0619 02/10/23 0512  WBC 14.3* 13.9*  HGB 12.3* 12.0*  HCT 37.1* 36.7*  PLT 277 290   BMET Recent Labs    02/09/23 0619 02/10/23 0512  NA 134* 132*  K 4.6 4.5  CL 103 102  CO2 24 21*  GLUCOSE 114* 122*  BUN 38* 38*  CREATININE 0.73 0.71  CALCIUM 7.6* 7.2*   PT/INR No results for input(s): "LABPROT", "INR" in the last 72 hours.  CMP     Component Value Date/Time   NA 132 (L) 02/10/2023 0512   K 4.5 02/10/2023 0512   CL 102 02/10/2023 0512   CO2 21 (L) 02/10/2023 0512   GLUCOSE 122 (H) 02/10/2023 0512   BUN 38 (H) 02/10/2023 0512   CREATININE 0.71 02/10/2023 0512   CALCIUM 7.2 (L) 02/10/2023 0512   PROT 5.1 (L) 02/10/2023 0512   ALBUMIN 1.5 (L) 02/10/2023 0512   AST 148  (H) 02/10/2023 0512   ALT 173 (H) 02/10/2023 0512   ALKPHOS 100 02/10/2023 0512   BILITOT 20.9 (HH) 02/10/2023 0512   GFRNONAA >60 02/10/2023 0512   GFRAA >60 09/23/2017 0507   Lipase  No results found for: "LIPASE"     Studies/Results: No results found.     Assessment/Plan 67 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 4/22 - Ex lap, adhesiolysis, duodenojejunal bypass, duodenostomy tube placement, T tube placement in common bile duct. 4/23 - Takeback for bleeding with hemorrhagic shock, resection of ischemic transverse colon, left in discontinuity with Abthera 4/24 - Takeback for bleeding, abdominal washout, ileocecectomy, pack placement, placement of Abthera 4/25 - Takeback, resection of 10cm necrotic distal ileum, washout, removal of packs, placement of Abthera 4/27 - Takeback, placement of J tube, end ileostomy, abdominal closure  - Hyperbilirubinemia: Tube cholangiogram on 5/3 showed patency of the biliary tree with no obstruction. Massive transfusion may have contributed, TPN could also be contributing. Stop TPN after current bag runs out. - Keep T tube and D tube to gravity drainage. JP to bulb suction. - Pain: oxycodone, prn dilaudid, scheduled IV robaxin - Agitation/delirium: Improved. Continue Seroquel qhs, Klonopin BID, prn haldol. Delirium precautions. - ID: Pseudomonas pneumonia, resistant to Zosyn and cefepime. Ciprofloxacin started 5/1 for 7-day course. - Wound care: Begin dakins to midline wound for 3 days. - FEN: NPO, ok for ice chips, begin D5 while TPN  is off. Awaiting bowel function. - WOC following for ostomy care and teaching - PT/OT following. Anticipate CIR at discharge. - VTE: SCDs, lovenox - Dispo: Transfer to progressive care.  I discussed the plan of care for the day with the patient's nurse and his wife at bedside.   LOS: 28 days    Sophronia Simas, MD [facility] Surgery General, Hepatobiliary and Pancreatic Surgery 02/10/23 6:40 AM

## 2023-02-10 NOTE — Progress Notes (Signed)
PHARMACY - TOTAL PARENTERAL NUTRITION CONSULT NOTE  Indication: Duodenal obstruction with ileus  Patient Measurements: Height: 6\' 1"  (185.4 cm) Weight: 76.5 kg (168 lb 10.4 oz) IBW/kg (Calculated) : 79.9 TPN AdjBW (KG): 65.5 Body mass index is 22.25 kg/m.  Assessment:  21 yoM admitted on 4/8 with N/V, chronic partial SBO, history of multiple previous abdominal surgeries. Pt has very minimal PO intake with ongoing weight loss.  Per documentation, patient has severe PCM.  Pharmacy consulted to manage TPN.   Glucose / Insulin: no hx DM - CBGs well controlled, SSI d/c 4/30  All readings < 180, no episodes of hypoglycemia, note 1 reading of 71 while off TPN  Electrolytes: Na 132/CL 102, K 4.5, CoCa: 9.2, Mag: 1.7, Phos: 3.3, no diuresis today  Renal: SCr 0.71, BUN 38 Hepatic: LFTs stable 148/173 but elevated, T-Bili increasing to 20.9, slight yellowing of sclera, albumin: 1.5, TG: 139  Intake / Output; MIVF: UOP 2.2 ml/kg/hr, drain , net +7L, no ostomy output yet  GI Imaging:  -5/3 CT abdomen: bowel edema, no cause identified for T-bili elevation  -5/3 cholangiogram: mild dilation of bilary tract   -4/4 UGI: Findings suggestive of chronic pSBO or ileus -4/9 CT: Ileus vs high grade partial SBO GI Surgeries / Procedures:  -4/11 EGD: 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 Placed biliary T-tube, duodenostomy tube - 4/22 2nd OR: ex lap with transverse colectomy, abd packed & left open w/ Abthera dressing - 4/24 OR: acute decompensation >> ex-lap with ileocectomy, temporary abd closure with neg pressure dressing.  MTP. - 4/25: ex-lap with washout, distal ileum resection, temp abd closure - 4/27 reopening of lap, washout and closure, ileostomy/J-tube placement   Central access: double lumen PICC placed 01/13/23 TPN  start date: 01/14/23  Nutritional Goals: RD Estimated Needs Total Energy Estimated Needs: 2050-2250 Total Protein Estimated Needs: 130-145 grams Total Fluid Estimated Needs: 2.1L/day  Current Nutrition:   TPN Ice chips   Plan:  Per Dr. Freida Busman will hold TPN for now due to raising T-bili, of note TPN was switched to cyclic on 5/5.  MD to re-consult pharmacy for TPN when ready to resume.   Estill Batten, PharmD, BCCCP  02/10/2023, 10:46 AM

## 2023-02-11 LAB — CBC
HCT: 35.7 % — ABNORMAL LOW (ref 39.0–52.0)
Hemoglobin: 12 g/dL — ABNORMAL LOW (ref 13.0–17.0)
MCH: 29.4 pg (ref 26.0–34.0)
MCHC: 33.6 g/dL (ref 30.0–36.0)
MCV: 87.5 fL (ref 80.0–100.0)
Platelets: 345 10*3/uL (ref 150–400)
RBC: 4.08 MIL/uL — ABNORMAL LOW (ref 4.22–5.81)
RDW: 20.3 % — ABNORMAL HIGH (ref 11.5–15.5)
WBC: 12.5 10*3/uL — ABNORMAL HIGH (ref 4.0–10.5)
nRBC: 0 % (ref 0.0–0.2)

## 2023-02-11 LAB — GLUCOSE, CAPILLARY
Glucose-Capillary: 70 mg/dL (ref 70–99)
Glucose-Capillary: 73 mg/dL (ref 70–99)
Glucose-Capillary: 80 mg/dL (ref 70–99)
Glucose-Capillary: 85 mg/dL (ref 70–99)
Glucose-Capillary: 86 mg/dL (ref 70–99)
Glucose-Capillary: 90 mg/dL (ref 70–99)

## 2023-02-11 MED ORDER — PIVOT 1.5 CAL PO LIQD
1000.0000 mL | ORAL | Status: DC
  Administered 2023-02-11 – 2023-02-12 (×2): 1000 mL
  Filled 2023-02-11 (×2): qty 1000

## 2023-02-11 NOTE — Progress Notes (Signed)
Nutrition Follow-up  DOCUMENTATION CODES:   Severe malnutrition in context of chronic illness  INTERVENTION:   Pivot 1.5 @ 20 ml/hr   Pivot 1.5 goal rate of 70 ml/hr  Provides: 2520 kcal, 157 grams protein, and 1276 ml free water    NUTRITION DIAGNOSIS:   Severe Malnutrition related to chronic illness as evidenced by moderate fat depletion, severe muscle depletion, energy intake < or equal to 75% for > or equal to 1 month, percent weight loss. Ongoing.   GOAL:   Patient will meet greater than or equal to 90% of their needs Progressing.   MONITOR:   Skin, Labs, TF tolerance  REASON FOR ASSESSMENT:   Consult Enteral/tube feeding initiation and management  ASSESSMENT:   67 yo male who was admitted from home with worsening nausea, vomiting, bloating and weakness. Patient with remote history of multiple prior laparotomies (in the 1970's), with a chronic partial small bowel obstruction.  Spoke with pt and wife. Pt was up to the chair but felt exhausted after working with PT to get to the chair.  Started Pivot 1.5 @ 20 ml/hr today via J-tube TPN off since 5/6 due to elevated Tbili and jaundice   04/08: admitted, PICC line placed 04/09: TPN initiated 04/13: increased to goal TPN of 50mL/hr 04/14: emergent EGD; oozing gastric ulcer s/p epi and clips 04/15: IR for emergent embolization, Intubated 04/22: s/p ex lap, LOA, duodenojejunal bypass with biliary T-tube, duodenostomy tube 04/23: s/p second OR for ex lap with transverse colectomy, abd packed and open abd, left in discontinuity due to colonic ischemia, ligation of bleeding mesenteric vessels,  04/24: OR due to acute decompensation s/p ex lap with ileocectomy, temp abd closure, MTP (did not receive TPN due to contaminated port) 04/25 - s/p ex lap, washout, SBR (10 cm necrotic distal ileum), VAC  04/27 - s/p ileostomy, j-tube placement, abd closure  04/30 - extubated  05/05 - TPN cycled  05/06 - TPN stopped due to  elevated bilirubin   Medications reviewed and include: protonix IV Cipro D5 1/2 NS @ 75 ml/hr  Labs reviewed:  Na 132 Tbili 9 --> 11.9 --> 15.6-->20.9 WBC 12.5 CBG's: 70-113    UOP: 2550 ml  NG: out T tube: 190 ml  L JP: 870 ml 16 F J-tube: using for feeding 24 F RUQ: 445 ml   +5.5 L   Admission weight: 68.1 kg  Current weight: 74.5 kg  Diet Order:   Diet Order             Diet NPO time specified Except for: Ice Chips  Diet effective now                   EDUCATION NEEDS:   Education needs have been addressed  Skin:  Skin Assessment: Skin Integrity Issues: Skin Integrity Issues:: Stage I Stage I: vertebral column  Last BM:  25 ml via new ileostomy  Height:   Ht Readings from Last 1 Encounters:  01/27/23 6\' 1"  (1.854 m)    Weight:   Wt Readings from Last 1 Encounters:  02/11/23 74.5 kg    BMI:  Body mass index is 21.67 kg/m.  Estimated Nutritional Needs:   Kcal:  2300-2600  Protein:  130-145 grams  Fluid:  2.1L/day  Krystian Younglove P., RD, LDN, CNSC See AMiON for contact information

## 2023-02-11 NOTE — Progress Notes (Signed)
Inpatient Rehabilitation Admissions Coordinator   I met at bedside with patient and his wife. I await further progress with therapies before pursuing CIR admit Auth with [employer].  Ottie Glazier, RN, MSN Rehab Admissions Coordinator [phone] 02/11/2023 12:32 PM

## 2023-02-11 NOTE — Progress Notes (Signed)
Occupational Therapy Treatment Patient Details Name: Bradley Dominguez MRN: [id number] DOB: 1956/09/26 Today's Date: 02/11/2023   History of present illness 67 yo M HF, chronic partial small bowel obstruction and multiple previous abd surgeries  who was admitted  4/8 from home w n/v/bloating. Underwent EGD 4/11 -- concerning for duodenal obstruction. Developed hemorrhagic shock 4/15 in the setting of hematemesis / hematochezia, decompensated requiring, intubation, embolization of jejunal arcade branch. Extubated 01/22/23. Underwent ex lap adhesiolysis, duodenojejunal bypass, duodenostomy tube placement, T tube placement in common bile duct 4/22, transferred to [facility] 4/23 with hemorrhagic shock, return to OR 4/23, 4/24, 4/25 and finally 4/27 with placement of J tube, end ileostomy, abdominal closure.  Extubated 4/30.   OT comments  Pt currently mod assist +2 for equipment to complete sit to stand and stand pivot transfers with use of the RW for support.  Standing endurance limited to less than 45 seconds with HR increasing up to 132 BPM and pt needing to sit and rest.  BP in sitting at 100/71 and 105/71 with HR at rest around 115 BPM with respirations in the mid 20s.  Feel pt is making steady progress but demonstrates limited endurance toward completion of basic selfcare tasks.  Recommend continued acute care OT to help increase overall endurance and participation in basic selfcare tasks.  Patient will benefit from intensive inpatient follow up therapy, >3 hours/day    Recommendations for follow up therapy are one component of a multi-disciplinary discharge planning process, led by the attending physician.  Recommendations may be updated based on patient status, additional functional criteria and insurance authorization.    Assistance Recommended at Discharge Frequent or constant Supervision/Assistance  Patient can return home with the following  A little help with walking and/or transfers;A little help with  bathing/dressing/bathroom;Assistance with cooking/housework;Assist for transportation;Help with stairs or ramp for entrance;Direct supervision/assist for medications management   Equipment Recommendations  Other (comment) (TBD next venue of care)    Recommendations for Other Services Rehab consult    Precautions / Restrictions Precautions Precautions: Fall Precaution Comments: multiple lines, JP drain, biliary tube, ileostomy; abdominal incision Restrictions Weight Bearing Restrictions: No       Mobility Bed Mobility Overal bed mobility: Needs Assistance Bed Mobility: Supine to Sit     Supine to sit: Max assist     General bed mobility comments: Assist for bringing LEs all the way off of the bed and then bringing trunk up to sitting.    Transfers Overall transfer level: Needs assistance Equipment used: Rolling walker (2 wheels) Transfers: Sit to/from Stand, Bed to chair/wheelchair/BSC Sit to Stand: Mod assist, +2 safety/equipment     Step pivot transfers: Mod assist, +2 safety/equipment     General transfer comment: Pt able to stand with the RW and flexed neck and trunk to complete step pivot to the recliner.  Short step length with pt sitting down before getting squared up to the surface secondary to fatigue.     Balance Overall balance assessment: Needs assistance Sitting-balance support: Feet supported Sitting balance-Leahy Scale: Poor Sitting balance - Comments: Initial min assist needed for static sitting, but progressed to supervision.   Standing balance support: Bilateral upper extremity supported Standing balance-Leahy Scale: Poor Standing balance comment: RW and therapist support needed for standing                           ADL either performed or assessed with clinical judgement   ADL Overall ADL's :  Needs assistance/impaired Eating/Feeding: NPO                   Lower Body Dressing: Total assistance;Sit to/from stand Lower Body  Dressing Details (indicate cue type and reason): simulated, pt unable to reach his LEs secondary to pain Toilet Transfer: Moderate assistance;Stand-pivot;BSC/3in1;Rolling walker (2 wheels);+2 for safety/equipment Toilet Transfer Details (indicate cue type and reason): simulated secondardy to pt with ileostomy and catheter         Functional mobility during ADLs: Moderate assistance;+2 for safety/equipment (stand pivot with use of the RW) General ADL Comments: Pt needed max assist for supine to sit with HOB elevated above 30 degrees.  Maintains cervical flexion in sitting and standing with posterior pelvic tilt.  Min assist for initial sitting balance but increased to close supervision after he scooted to EOB with mod facilitation.  HR increasing from 110 up to 132 BPM with standing.  BP in sitting 100/71 and 105/71.  Decreased standing enurance of less than 45 seconds.      Cognition Arousal/Alertness: Awake/alert Behavior During Therapy: Flat affect Overall Cognitive Status: Impaired/Different from baseline Area of Impairment: Memory, Attention, Following commands, Orientation, Awareness, Problem solving                 Orientation Level: Time Current Attention Level: Sustained Memory: Decreased short-term memory Following Commands: Follows one step commands with increased time Safety/Judgement: Decreased awareness of safety, Decreased awareness of deficits   Problem Solving: Slow processing, Decreased initiation, Requires tactile cues, Requires verbal cues General Comments: Pt still with flat affect at this time and needing mod demonstrational cueing for hand placement with sit to stand.                   Pertinent Vitals/ Pain       Pain Assessment Pain Assessment: 0-10 Pain Score: 6  Pain Location: abdomen Pain Descriptors / Indicators: Discomfort Pain Intervention(s): Premedicated before session, Monitored during session         Frequency  Min 2X/week         Progress Toward Goals  OT Goals(current goals can now be found in the care plan section)  Progress towards OT goals: Progressing toward goals  Acute Rehab OT Goals Patient Stated Goal: Pt wants to get stronger OT Goal Formulation: With patient/family Time For Goal Achievement: 02/20/23 Potential to Achieve Goals: Good  Plan Discharge plan remains appropriate       AM-PAC OT "6 Clicks" Daily Activity     Outcome Measure   Help from another person eating meals?: Total (NPO) Help from another person taking care of personal grooming?: A Little Help from another person toileting, which includes using toliet, bedpan, or urinal?: A Lot Help from another person bathing (including washing, rinsing, drying)?: A Lot Help from another person to put on and taking off regular upper body clothing?: A Little Help from another person to put on and taking off regular lower body clothing?: Total 6 Click Score: 12    End of Session Equipment Utilized During Treatment: Gait belt  OT Visit Diagnosis: Unsteadiness on feet (R26.81);Other abnormalities of gait and mobility (R26.89);Muscle weakness (generalized) (M62.81);Other symptoms and signs involving cognitive function;Pain Pain - Right/Left: Right   Activity Tolerance Patient limited by fatigue   Patient Left in chair;with call bell/phone within reach;with chair alarm set   Nurse Communication Mobility status;Need for lift equipment        Time: 1019-1105 OT Time Calculation (min): 46 min  Charges: OT  General Charges $OT Visit: 1 Visit OT Treatments $Self Care/Home Management : 38-52 mins   Perrin Maltese, OTR/L Acute Rehabilitation Services  Office [phone] 02/11/2023

## 2023-02-11 NOTE — Progress Notes (Signed)
Patient ID: Bradley Dominguez, male   DOB: October 04, 1956, 67 y.o.   MRN: [id number] 10 Days Post-Op    Subjective: C/o raspy voice, no pain, got UOB yesterday ROS negative except as listed above. Objective: Vital signs in last 24 hours: Temp:  [97.5 F (36.4 C)-98.5 F (36.9 C)] 98.5 F (36.9 C) (05/07 0400) Pulse Rate:  [79-112] 96 (05/07 0700) Resp:  [16-32] 26 (05/07 0700) BP: (95-122)/(51-79) 107/58 (05/07 0700) SpO2:  [97 %-100 %] 97 % (05/07 0700) Weight:  [74.5 kg] 74.5 kg (05/07 0500) Last BM Date : 01/30/23 (flexiseal)  Intake/Output from previous day: 05/06 0701 - 05/07 0700 In: 2137.7 [I.V.:1737.8; IV Piggyback:399.9] Out: 4055 [Urine:2550; Drains:1505] Intake/Output this shift: Total I/O In: -  Out: 160 [Drains:160]  General appearance: alert and cooperative Cardio: regular rate and rhythm GI: soft JP seropurulent, T tube and D tube draining, some stool in ostomy  Lab Results: CBC  Recent Labs    02/10/23 0512 02/11/23 0540  WBC 13.9* 12.5*  HGB 12.0* 12.0*  HCT 36.7* 35.7*  PLT 290 345   BMET Recent Labs    02/09/23 0619 02/10/23 0512  NA 134* 132*  K 4.6 4.5  CL 103 102  CO2 24 21*  GLUCOSE 114* 122*  BUN 38* 38*  CREATININE 0.73 0.71  CALCIUM 7.6* 7.2*   PT/INR No results for input(s): "LABPROT", "INR" in the last 72 hours. ABG No results for input(s): "PHART", "HCO3" in the last 72 hours.  Invalid input(s): "PCO2", "PO2"  Studies/Results: No results found.  Anti-infectives: Anti-infectives (From admission, onward)    Start     Dose/Rate Route Frequency Ordered Stop   02/05/23 1345  ciprofloxacin (CIPRO) IVPB 400 mg        400 mg 200 mL/hr over 60 Minutes Intravenous Every 12 hours 02/05/23 1259 02/12/23 0959   01/28/23 1000  piperacillin-tazobactam (ZOSYN) IVPB 3.375 g  Status:  Discontinued        3.375 g 12.5 mL/hr over 240 Minutes Intravenous Every 8 hours 01/28/23 0902 02/05/23 1259   01/27/23 1030  ceFAZolin (ANCEF) IVPB 2g/100  mL premix       See Hyperspace for full Linked Orders Report.   2 g 200 mL/hr over 30 Minutes Intravenous On call to O.R. 01/27/23 0746 01/27/23 1135   01/27/23 1030  metroNIDAZOLE (FLAGYL) IVPB 500 mg       See Hyperspace for full Linked Orders Report.   500 mg 100 mL/hr over 60 Minutes Intravenous On call to O.R. 01/27/23 0746 01/27/23 1141   01/19/23 0600  cefTRIAXone (ROCEPHIN) 2 g in sodium chloride 0.9 % 100 mL IVPB       See Hyperspace for full Linked Orders Report.   2 g 200 mL/hr over 30 Minutes Intravenous On call to O.R. 01/19/23 0350 01/19/23 0606   01/19/23 0350  metroNIDAZOLE (FLAGYL) IVPB 500 mg  Status:  Discontinued       See Hyperspace for full Linked Orders Report.   500 mg 100 mL/hr over 60 Minutes Intravenous Every 8 hours 01/19/23 0350 01/19/23 0752       Assessment/Plan: 67 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 4/22 - Ex lap, adhesiolysis, duodenojejunal bypass, duodenostomy tube placement, T tube placement in common bile duct. 4/23 - Takeback for bleeding with hemorrhagic shock, resection of ischemic transverse colon, left in discontinuity with Abthera 4/24 - Takeback for bleeding, abdominal washout, ileocecectomy, pack placement, placement of Abthera 4/25 - Takeback, resection of 10cm necrotic distal ileum, washout, removal of  packs, placement of Abthera 4/27 - Takeback, placement of J tube, end ileostomy, abdominal closure  - Hyperbilirubinemia: Tube cholangiogram on 5/3 showed patency of the biliary tree with no obstruction. Massive transfusion may have contributed, TPN off. Trend bili - Keep T tube and D tube to gravity drainage. JP to bulb suction. - Pain: oxycodone, prn dilaudid, scheduled IV robaxin - Agitation/delirium: Improved. Continue Seroquel qhs, Klonopin BID, prn haldol. Delirium precautions. - ID: Pseudomonas pneumonia, resistant to Zosyn and cefepime. Ciprofloxacin started 5/1 for 7-day course. - Wound care: Begin dakins to midline wound for 3  days. - FEN: NPO, ok for ice chips, begin D5 while TPN is off. Stool in ostomy so trickle TF now - WOC following for ostomy care and teaching - PT/OT following. Anticipate CIR at discharge. - VTE: SCDs, lovenox - Dispo: Transfer to progressive care.  I discussed the plan of care for the day with the patient's nurse and his wife at bedside.  LOS: 29 days    Violeta Gelinas, MD, MPH, FACS Trauma & General Surgery Use [email or web address] to contact on call provider  02/11/2023

## 2023-02-11 NOTE — Progress Notes (Signed)
SLP Cancellation Note  Patient Details Name: Bradley Dominguez MRN: [id number] DOB: 14-Sep-1956   Cancelled treatment:       Reason Eval/Treat Not Completed: Patient not medically ready. No clearance from surgery for sips or further assessment. Starting trickle feeds today per chart   Shelden Raborn, Riley Nearing 02/11/2023, 12:08 PM

## 2023-02-12 LAB — BASIC METABOLIC PANEL
Anion gap: 9 (ref 5–15)
BUN: 19 mg/dL (ref 8–23)
CO2: 21 mmol/L — ABNORMAL LOW (ref 22–32)
Calcium: 7 mg/dL — ABNORMAL LOW (ref 8.9–10.3)
Chloride: 96 mmol/L — ABNORMAL LOW (ref 98–111)
Creatinine, Ser: 0.73 mg/dL (ref 0.61–1.24)
GFR, Estimated: >60 mL/min (ref >60)
Glucose, Bld: 321 mg/dL — ABNORMAL HIGH (ref 70–99)
Potassium: 3.9 mmol/L (ref 3.5–5.1)
Sodium: 126 mmol/L — ABNORMAL LOW (ref 135–145)

## 2023-02-12 LAB — CBC
HCT: 33.3 % — ABNORMAL LOW (ref 39.0–52.0)
Hemoglobin: 11.5 g/dL — ABNORMAL LOW (ref 13.0–17.0)
MCH: 29.6 pg (ref 26.0–34.0)
MCHC: 34.5 g/dL (ref 30.0–36.0)
MCV: 85.6 fL (ref 80.0–100.0)
Platelets: 327 10*3/uL (ref 150–400)
RBC: 3.89 MIL/uL — ABNORMAL LOW (ref 4.22–5.81)
RDW: 19.9 % — ABNORMAL HIGH (ref 11.5–15.5)
WBC: 10.6 10*3/uL — ABNORMAL HIGH (ref 4.0–10.5)
nRBC: 0 % (ref 0.0–0.2)

## 2023-02-12 LAB — GLUCOSE, CAPILLARY
Glucose-Capillary: 86 mg/dL (ref 70–99)
Glucose-Capillary: 96 mg/dL (ref 70–99)
Glucose-Capillary: 107 mg/dL — ABNORMAL HIGH (ref 70–99)
Glucose-Capillary: 91 mg/dL (ref 70–99)
Glucose-Capillary: 93 mg/dL (ref 70–99)
Glucose-Capillary: 99 mg/dL (ref 70–99)

## 2023-02-12 NOTE — Progress Notes (Signed)
Physical Therapy Treatment Patient Details Name: Bradley Dominguez MRN: [id number] DOB: January 06, 1956 Today's Date: 02/12/2023   History of Present Illness 66 yo M HF, chronic partial small bowel obstruction and multiple previous abd surgeries  who was admitted  4/8 from home w n/v/bloating. Underwent EGD 4/11 -- concerning for duodenal obstruction. Developed hemorrhagic shock 4/15 in the setting of hematemesis / hematochezia, decompensated requiring, intubation, embolization of jejunal arcade branch. Extubated 01/22/23. Underwent ex lap adhesiolysis, duodenojejunal bypass, duodenostomy tube placement, T tube placement in common bile duct 4/22, transferred to [facility] 4/23 with hemorrhagic shock, return to OR 4/23, 4/24, 4/25 and finally 4/27 with placement of J tube, end ileostomy, abdominal closure.  Extubated 4/30.    PT Comments    Patient continuing to progress now stepping to recliner without lift equipment though upright standing not well tolerated as wife reports bloating as pt working to get used to tube feeds.  Patient initially sleeping after meds due to abdominal pain, though once roused eager to "try".  Wife supportive and seems eager for intensive inpatient rehab prior to d/c home.   Recommendations for follow up therapy are one component of a multi-disciplinary discharge planning process, led by the attending physician.  Recommendations may be updated based on patient status, additional functional criteria and insurance authorization.  Follow Up Recommendations       Assistance Recommended at Discharge Frequent or constant Supervision/Assistance  Patient can return home with the following Two people to help with walking and/or transfers;Assistance with cooking/housework;Direct supervision/assist for medications management;Assist for transportation;Help with stairs or ramp for entrance;Two people to help with bathing/dressing/bathroom   Equipment Recommendations  Other (comment) (TBA)     Recommendations for Other Services       Precautions / Restrictions Precautions Precautions: Fall Precaution Comments: multiple lines, JP drain, biliary tube, ileostomy; abdominal incision     Mobility  Bed Mobility Overal bed mobility: Needs Assistance Bed Mobility: Rolling, Sidelying to Sit Rolling: Min assist Sidelying to sit: Mod assist       General bed mobility comments: cues and increased time for legs off bed and assist for trunk upright    Transfers   Equipment used: Rolling walker (2 wheels) Transfers: Sit to/from Stand Sit to Stand: Mod assist, +2 safety/equipment   Step pivot transfers: Mod assist, +2 safety/equipment       General transfer comment: flexed posture with heavy reliance on RW which was adjusted to his height, assist for balance and cues for backing up to recliner, wife in the room and assisting as well with placing chair    Ambulation/Gait                   Stairs             Wheelchair Mobility    Modified Rankin (Stroke Patients Only)       Balance Overall balance assessment: Needs assistance Sitting-balance support: Feet supported Sitting balance-Leahy Scale: Fair     Standing balance support: Bilateral upper extremity supported Standing balance-Leahy Scale: Poor Standing balance comment: RW and therapist support needed for standing                            Cognition Arousal/Alertness: Awake/alert Behavior During Therapy: Flat affect Overall Cognitive Status: Impaired/Different from baseline Area of Impairment: Memory, Attention, Following commands, Orientation, Awareness, Problem solving  Current Attention Level: Sustained Memory: Decreased short-term memory Following Commands: Follows one step commands with increased time Safety/Judgement: Decreased awareness of safety, Decreased awareness of deficits   Problem Solving: Slow processing, Requires tactile cues,  Requires verbal cues General Comments: sleeping upon PT entry, lethargic initially and needing re-orientation and increased time for commands        Exercises General Exercises - Lower Extremity Ankle Circles/Pumps: AROM, Seated, Both, 10 reps    General Comments General comments (skin integrity, edema, etc.): HR max 124 with up to recliner, SpO2 stable on RA; wife in the room and encouraging pt.      Pertinent Vitals/Pain Pain Assessment Pain Score: 5  Pain Location: abdomen Pain Descriptors / Indicators: Discomfort, Grimacing, Guarding, Pressure (bloating) Pain Intervention(s): Monitored during session, Repositioned    Home Living                          Prior Function            PT Goals (current goals can now be found in the care plan section) Progress towards PT goals: Progressing toward goals    Frequency    Min 3X/week      PT Plan Current plan remains appropriate    Co-evaluation              AM-PAC PT "6 Clicks" Mobility   Outcome Measure  Help needed turning from your back to your side while in a flat bed without using bedrails?: A Lot Help needed moving from lying on your back to sitting on the side of a flat bed without using bedrails?: A Lot Help needed moving to and from a bed to a chair (including a wheelchair)?: Total Help needed standing up from a chair using your arms (e.g., wheelchair or bedside chair)?: A Lot Help needed to walk in hospital room?: Total Help needed climbing 3-5 steps with a railing? : Total 6 Click Score: 9    End of Session Equipment Utilized During Treatment: Gait belt Activity Tolerance: Patient limited by fatigue Patient left: in chair;with chair alarm set;with call bell/phone within reach Nurse Communication: Mobility status PT Visit Diagnosis: Other abnormalities of gait and mobility (R26.89);Muscle weakness (generalized) (M62.81);Other symptoms and signs involving the nervous system (R29.898)      Time: 2956-2130 PT Time Calculation (min) (ACUTE ONLY): 27 min  Charges:  $Therapeutic Activity: 23-37 mins                     Bradley Dominguez, PT Acute Rehabilitation Services [phone] 02/12/2023    Bradley Dominguez 02/12/2023, 5:39 PM

## 2023-02-12 NOTE — Progress Notes (Addendum)
Patient ID: Bradley Dominguez, male   DOB: 12/24/1955, 67 y.o.   MRN: [id number] 11 Days Post-Op    Subjective: Reports no sig pain, they did not have to empty ileostomy ROS negative except as listed above. Objective: Vital signs in last 24 hours: Temp:  [97.4 F (36.3 C)-98.8 F (37.1 C)] 98.6 F (37 C) (05/08 0757) Pulse Rate:  [77-114] 93 (05/08 0757) Resp:  [12-26] 24 (05/08 0757) BP: (90-111)/(54-72) 105/65 (05/08 0757) SpO2:  [97 %-100 %] 98 % (05/08 0757) Weight:  [71.7 kg] 71.7 kg (05/08 0326) Last BM Date :  (Got a little stool in the bag)  Intake/Output from previous day: 05/07 0701 - 05/08 0700 In: 2444.4 [I.V.:1786.8; NG/GT:437.3; IV Piggyback:220.3] Out: 4135 [Urine:2530; Drains:1605] Intake/Output this shift: No intake/output data recorded.  General appearance: alert and cooperative Resp: clear to auscultation bilaterally GI: soft, not much out from ileostomy, D tube J tube T tube and JP stable, wound OK  Lab Results: CBC  Recent Labs    02/11/23 0540 02/12/23 0412  WBC 12.5* 10.6*  HGB 12.0* 11.5*  HCT 35.7* 33.3*  PLT 345 327   BMET Recent Labs    02/10/23 0512 02/12/23 0412  NA 132* 126*  K 4.5 3.9  CL 102 96*  CO2 21* 21*  GLUCOSE 122* 321*  BUN 38* 19  CREATININE 0.71 0.73  CALCIUM 7.2* 7.0*   PT/INR No results for input(s): "LABPROT", "INR" in the last 72 hours. ABG No results for input(s): "PHART", "HCO3" in the last 72 hours.  Invalid input(s): "PCO2", "PO2"  Studies/Results: No results found.  Anti-infectives: Anti-infectives (From admission, onward)    Start     Dose/Rate Route Frequency Ordered Stop   02/05/23 1345  ciprofloxacin (CIPRO) IVPB 400 mg        400 mg 200 mL/hr over 60 Minutes Intravenous Every 12 hours 02/05/23 1259 02/11/23 2217   01/28/23 1000  piperacillin-tazobactam (ZOSYN) IVPB 3.375 g  Status:  Discontinued        3.375 g 12.5 mL/hr over 240 Minutes Intravenous Every 8 hours 01/28/23 0902 02/05/23 1259    01/27/23 1030  ceFAZolin (ANCEF) IVPB 2g/100 mL premix       See Hyperspace for full Linked Orders Report.   2 g 200 mL/hr over 30 Minutes Intravenous On call to O.R. 01/27/23 0746 01/27/23 1135   01/27/23 1030  metroNIDAZOLE (FLAGYL) IVPB 500 mg       See Hyperspace for full Linked Orders Report.   500 mg 100 mL/hr over 60 Minutes Intravenous On call to O.R. 01/27/23 0746 01/27/23 1141   01/19/23 0600  cefTRIAXone (ROCEPHIN) 2 g in sodium chloride 0.9 % 100 mL IVPB       See Hyperspace for full Linked Orders Report.   2 g 200 mL/hr over 30 Minutes Intravenous On call to O.R. 01/19/23 0350 01/19/23 0606   01/19/23 0350  metroNIDAZOLE (FLAGYL) IVPB 500 mg  Status:  Discontinued       See Hyperspace for full Linked Orders Report.   500 mg 100 mL/hr over 60 Minutes Intravenous Every 8 hours 01/19/23 0350 01/19/23 0752       Assessment/Plan: 67 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 4/22 - Ex lap, adhesiolysis, duodenojejunal bypass, duodenostomy tube placement, T tube placement in common bile duct. 4/23 - Takeback for bleeding with hemorrhagic shock, resection of ischemic transverse colon, left in discontinuity with Abthera 4/24 - Takeback for bleeding, abdominal washout, ileocecectomy, pack placement, placement of Abthera 4/25 -  Takeback, resection of 10cm necrotic distal ileum, washout, removal of packs, placement of Abthera 4/27 - Takeback, placement of J tube, end ileostomy, abdominal closure   - Hyperbilirubinemia: Tube cholangiogram on 5/3 showed patency of the biliary tree with no obstruction. Massive transfusion may have contributed, TPN off. Trend bili - Keep T tube and D tube to gravity drainage. JP to bulb suction. - Pain: oxycodone, prn dilaudid, scheduled IV robaxin - Agitation/delirium: Improved. Continue Seroquel qhs, Klonopin BID, prn haldol. Delirium precautions. - ID: Pseudomonas pneumonia, resistant to Zosyn and cefepime. Ciprofloxacin started 5/1 for 7-day course. -  Wound care: Begin dakins to midline wound for 3 days. - FEN: NPO, ok for ice chips, continue TF at 20/h until more bowel function - WOC following for ostomy care and teaching - PT/OT following. Anticipate CIR at discharge. - VTE: SCDs, lovenox - Dispo: 4NP, therapies I spoke with his wife  LOS: 30 days    Violeta Gelinas, MD, MPH, FACS Trauma & General Surgery Use [email or web address] to contact on call provider  02/12/2023

## 2023-02-13 LAB — GLUCOSE, CAPILLARY
Glucose-Capillary: 105 mg/dL — ABNORMAL HIGH (ref 70–99)
Glucose-Capillary: 116 mg/dL — ABNORMAL HIGH (ref 70–99)
Glucose-Capillary: 106 mg/dL — ABNORMAL HIGH (ref 70–99)
Glucose-Capillary: 92 mg/dL (ref 70–99)
Glucose-Capillary: 99 mg/dL (ref 70–99)
Glucose-Capillary: 170 mg/dL — ABNORMAL HIGH (ref 70–99)
Glucose-Capillary: 193 mg/dL — ABNORMAL HIGH (ref 70–99)

## 2023-02-13 LAB — CBC
HCT: 36 % — ABNORMAL LOW (ref 39.0–52.0)
Hemoglobin: 12.5 g/dL — ABNORMAL LOW (ref 13.0–17.0)
MCH: 29.5 pg (ref 26.0–34.0)
MCHC: 34.7 g/dL (ref 30.0–36.0)
MCV: 84.9 fL (ref 80.0–100.0)
Platelets: 389 10*3/uL (ref 150–400)
RBC: 4.24 MIL/uL (ref 4.22–5.81)
RDW: 20.1 % — ABNORMAL HIGH (ref 11.5–15.5)
WBC: 13.2 10*3/uL — ABNORMAL HIGH (ref 4.0–10.5)
nRBC: 0 % (ref 0.0–0.2)

## 2023-02-13 LAB — COMPREHENSIVE METABOLIC PANEL
ALT: 231 U/L — ABNORMAL HIGH (ref 0–44)
AST: 219 U/L — ABNORMAL HIGH (ref 15–41)
Albumin: 1.5 g/dL — ABNORMAL LOW (ref 3.5–5.0)
Alkaline Phosphatase: 133 U/L — ABNORMAL HIGH (ref 38–126)
Anion gap: 8 (ref 5–15)
BUN: 21 mg/dL (ref 8–23)
CO2: 23 mmol/L (ref 22–32)
Calcium: 7.8 mg/dL — ABNORMAL LOW (ref 8.9–10.3)
Chloride: 97 mmol/L — ABNORMAL LOW (ref 98–111)
Creatinine, Ser: 0.86 mg/dL (ref 0.61–1.24)
GFR, Estimated: >60 mL/min (ref >60)
Glucose, Bld: 98 mg/dL (ref 70–99)
Potassium: 4.2 mmol/L (ref 3.5–5.1)
Sodium: 128 mmol/L — ABNORMAL LOW (ref 135–145)
Total Bilirubin: 29.5 mg/dL (ref 0.3–1.2)
Total Protein: 5.5 g/dL — ABNORMAL LOW (ref 6.5–8.1)

## 2023-02-13 LAB — MAGNESIUM: Magnesium: 1.6 mg/dL — ABNORMAL LOW (ref 1.7–2.4)

## 2023-02-13 LAB — PHOSPHORUS: Phosphorus: 4.7 mg/dL — ABNORMAL HIGH (ref 2.5–4.6)

## 2023-02-13 MED ORDER — PIVOT 1.5 CAL PO LIQD
1000.0000 mL | ORAL | Status: DC
  Administered 2023-02-13 – 2023-02-17 (×6): 1000 mL
  Filled 2023-02-13 (×7): qty 1000

## 2023-02-13 NOTE — Progress Notes (Signed)
Occupational Therapy Treatment Patient Details Name: Bradley Dominguez MRN: [id number] DOB: 08-31-1956 Today's Date: 02/13/2023   History of present illness 67 yo M HF, chronic partial small bowel obstruction and multiple previous abd surgeries  who was admitted  4/8 from home w n/v/bloating. Underwent EGD 4/11 -- concerning for duodenal obstruction. Developed hemorrhagic shock 4/15 in the setting of hematemesis / hematochezia, decompensated requiring, intubation, embolization of jejunal arcade branch. Extubated 01/22/23. Underwent ex lap adhesiolysis, duodenojejunal bypass, duodenostomy tube placement, T tube placement in common bile duct 4/22, transferred to [facility] 4/23 with hemorrhagic shock, return to OR 4/23, 4/24, 4/25 and finally 4/27 with placement of J tube, end ileostomy, abdominal closure.  Extubated 4/30.   OT comments  Pt currently at mod to max assist level for dressing tasks sit to stand with mod assist for simulated toilet transfers step pivot with use of the RW for support.  HR elevated in the 120s with sitting EOB and with transfer.  BP lowered throughout session with initial sitting at 101/67 but decreased to 82/62, and 73/158.  Pt still with limited endurance and will benefit from ongoing acute care OT to help increase ADL independence and reduce caregiver burden.  Patient will benefit from intensive inpatient follow up therapy, >3 hours/day.    Recommendations for follow up therapy are one component of a multi-disciplinary discharge planning process, led by the attending physician.  Recommendations may be updated based on patient status, additional functional criteria and insurance authorization.    Assistance Recommended at Discharge Frequent or constant Supervision/Assistance  Patient can return home with the following  A little help with walking and/or transfers;A little help with bathing/dressing/bathroom;Assistance with cooking/housework;Assist for transportation;Help with stairs or  ramp for entrance;Direct supervision/assist for medications management   Equipment Recommendations  Other (comment) (TBD next venue of care)       Precautions / Restrictions Precautions Precautions: Fall Precaution Comments: multiple lines, JP drain, biliary tube, ileostomy; abdominal incision Restrictions Weight Bearing Restrictions: No       Mobility Bed Mobility Overal bed mobility: Needs Assistance Bed Mobility: Supine to Sit     Supine to sit: Max assist     General bed mobility comments: Assist needed to bring trunk up to sitting.    Transfers Overall transfer level: Needs assistance Equipment used: Rolling walker (2 wheels) Transfers: Sit to/from Stand Sit to Stand: Mod assist, +2 safety/equipment     Step pivot transfers: Mod assist, +2 safety/equipment     General transfer comment: Flexed head and trunk with short steps to the recliner.  Standing limited secondary to BP.     Balance Overall balance assessment: Needs assistance Sitting-balance support: Feet supported Sitting balance-Leahy Scale: Fair     Standing balance support: Bilateral upper extremity supported Standing balance-Leahy Scale: Poor Standing balance comment: BUE support and therapist assist needed for balance.                           ADL either performed or assessed with clinical judgement   ADL Overall ADL's : Needs assistance/impaired                     Lower Body Dressing: Moderate assistance;Sitting/lateral leans Lower Body Dressing Details (indicate cue type and reason): to donn gripper socks Toilet Transfer: Moderate assistance;+2 for safety/equipment;Rolling walker (2 wheels) Toilet Transfer Details (indicate cue type and reason): simulated to the bedside chair pt with catheter and ileostomy  Functional mobility during ADLs: Moderate assistance;+2 for safety/equipment (stand pivot with use of the RW.) General ADL Comments: Pt still maintaining  cervical flexion in sitting and standing.  He was able to cross the LLE over the right knee with assist and maintain to donn gripper sock with mod facilitation.  He was not able to complete crossing the RLE over the left knee and maintaining secondary to increased abdominal pain. Educated on use of wide sockaide and he was able to donn the right sock with setup and min facilitation.   Slight hypotension noted in sitting with BP initially at 101/67 but then deccreasing to 82/62.  After transfer to the recliner his BP was 73/158 with his feet down.  Once he was reclined, BP increased to 94/59.  HR maintained in the mid 120s with sitting EOB.      Cognition Arousal/Alertness: Awake/alert Behavior During Therapy: Flat affect Overall Cognitive Status: Impaired/Different from baseline Area of Impairment: Orientation, Awareness, Problem solving, Attention                 Orientation Level: Place, Time Current Attention Level: Sustained Memory: Decreased short-term memory Following Commands: Follows one step commands with increased time Safety/Judgement: Decreased awareness of deficits, Decreased awareness of safety Awareness: Emergent Problem Solving: Slow processing, Requires verbal cues          Exercises      Shoulder Instructions       General Comments      Pertinent Vitals/ Pain       Pain Assessment Pain Assessment: Faces Faces Pain Scale: Hurts a little bit Pain Location: abdomen Pain Descriptors / Indicators: Discomfort Pain Intervention(s): Limited activity within patient's tolerance, Repositioned         Frequency  Min 2X/week        Progress Toward Goals  OT Goals(current goals can now be found in the care plan section)  Progress towards OT goals: Progressing toward goals  Acute Rehab OT Goals Patient Stated Goal: Pt wants to get back to water skiing OT Goal Formulation: With patient/family Time For Goal Achievement: 02/20/23 Potential to Achieve  Goals: Good  Plan Discharge plan remains appropriate       AM-PAC OT "6 Clicks" Daily Activity     Outcome Measure   Help from another person eating meals?: Total Help from another person taking care of personal grooming?: A Little Help from another person toileting, which includes using toliet, bedpan, or urinal?: A Lot Help from another person bathing (including washing, rinsing, drying)?: A Lot Help from another person to put on and taking off regular upper body clothing?: A Little Help from another person to put on and taking off regular lower body clothing?: A Lot 6 Click Score: 13    End of Session Equipment Utilized During Treatment: Gait belt  OT Visit Diagnosis: Unsteadiness on feet (R26.81);Other abnormalities of gait and mobility (R26.89);Muscle weakness (generalized) (M62.81);Other symptoms and signs involving cognitive function;Pain Pain - part of body:  (abdomen)   Activity Tolerance Patient limited by fatigue;Other (comment) (limted By decreased BP)   Patient Left with call bell/phone within reach;in chair   Nurse Communication Mobility status;Other (comment) (BP)        Time: 4098-1191 OT Time Calculation (min): 50 min  Charges: OT General Charges $OT Visit: 1 Visit OT Treatments $Self Care/Home Management : 38-52 mins  Perrin Maltese, OTR/L Acute Rehabilitation Services  Office [phone] 02/13/2023

## 2023-02-13 NOTE — Plan of Care (Signed)
  Problem: Education: Goal: Knowledge of General Education information will improve Description: Including pain rating scale, medication(s)/side effects and non-pharmacologic comfort measures Outcome: Progressing   Problem: Health Behavior/Discharge Planning: Goal: Ability to manage health-related needs will improve Outcome: Progressing   Problem: Clinical Measurements: Goal: Ability to maintain clinical measurements within normal limits will improve Outcome: Progressing Goal: Will remain free from infection Outcome: Progressing Goal: Diagnostic test results will improve Outcome: Progressing Goal: Respiratory complications will improve Outcome: Progressing Goal: Cardiovascular complication will be avoided Outcome: Progressing   Problem: Activity: Goal: Risk for activity intolerance will decrease Outcome: Progressing   Problem: Nutrition: Goal: Adequate nutrition will be maintained Outcome: Progressing   Problem: Coping: Goal: Level of anxiety will decrease Outcome: Progressing   Problem: Elimination: Goal: Will not experience complications related to bowel motility Outcome: Progressing Goal: Will not experience complications related to urinary retention Outcome: Progressing   Problem: Pain Managment: Goal: General experience of comfort will improve Outcome: Progressing   Problem: Safety: Goal: Ability to remain free from injury will improve Outcome: Progressing   Problem: Skin Integrity: Goal: Risk for impaired skin integrity will decrease Outcome: Progressing   Problem: Education: Goal: Ability to describe self-care measures that may prevent or decrease complications (Diabetes Survival Skills Education) will improve Outcome: Progressing Goal: Individualized Educational Video(s) Outcome: Progressing   Problem: Coping: Goal: Ability to adjust to condition or change in health will improve Outcome: Progressing   Problem: Fluid Volume: Goal: Ability to  maintain a balanced intake and output will improve Outcome: Progressing   Problem: Health Behavior/Discharge Planning: Goal: Ability to identify and utilize available resources and services will improve Outcome: Progressing Goal: Ability to manage health-related needs will improve Outcome: Progressing   Problem: Metabolic: Goal: Ability to maintain appropriate glucose levels will improve Outcome: Progressing   Problem: Nutritional: Goal: Maintenance of adequate nutrition will improve Outcome: Progressing Goal: Progress toward achieving an optimal weight will improve Outcome: Progressing   Problem: Skin Integrity: Goal: Risk for impaired skin integrity will decrease Outcome: Progressing   Problem: Tissue Perfusion: Goal: Adequacy of tissue perfusion will improve Outcome: Progressing   Problem: Education: Goal: Understanding of CV disease, CV risk reduction, and recovery process will improve Outcome: Progressing Goal: Individualized Educational Video(s) Outcome: Progressing   Problem: Activity: Goal: Ability to return to baseline activity level will improve Outcome: Progressing   Problem: Cardiovascular: Goal: Ability to achieve and maintain adequate cardiovascular perfusion will improve Outcome: Progressing Goal: Vascular access site(s) Level 0-1 will be maintained Outcome: Progressing   Problem: Health Behavior/Discharge Planning: Goal: Ability to safely manage health-related needs after discharge will improve Outcome: Progressing   Problem: Cardiac: Goal: Ability to maintain an adequate cardiac output will improve Outcome: Progressing   Problem: Health Behavior/Discharge Planning: Goal: Ability to identify and utilize available resources and services will improve Outcome: Progressing Goal: Ability to manage health-related needs will improve Outcome: Progressing   Problem: Fluid Volume: Goal: Ability to achieve a balanced intake and output will  improve Outcome: Progressing   Problem: Metabolic: Goal: Ability to maintain appropriate glucose levels will improve Outcome: Progressing   Problem: Nutritional: Goal: Maintenance of adequate nutrition will improve Outcome: Progressing Goal: Maintenance of adequate weight for body size and type will improve Outcome: Progressing

## 2023-02-13 NOTE — Progress Notes (Signed)
Patient ID: Bradley Dominguez, male   DOB: April 05, 1956, 67 y.o.   MRN: [id number] 12 Days Post-Op    Subjective: Reports feeling good ROS negative except as listed above. Objective: Vital signs in last 24 hours: Temp:  [97.5 F (36.4 C)-99.1 F (37.3 C)] 99.1 F (37.3 C) (05/09 0737) Pulse Rate:  [89-102] 94 (05/09 0737) Resp:  [16-24] 17 (05/09 0737) BP: (92-110)/(54-71) 102/64 (05/09 0737) SpO2:  [92 %-100 %] 96 % (05/09 0737) Weight:  [72 kg] 72 kg (05/09 0454) Last BM Date : 02/13/23  Intake/Output from previous day: 05/08 0701 - 05/09 0700 In: 2083.1 [I.V.:1548.1; NG/GT:415] Out: 3390 [Urine:1450; Drains:1420; Stool:520] Intake/Output this shift: Total I/O In: -  Out: 140 [Drains:90; Stool:50]  General appearance: alert and cooperative Resp: clear to auscultation bilaterally GI: soft, NT, wound OK, T tube D tube and JP draining, J tube with TF  Lab Results: CBC  Recent Labs    02/12/23 0412 02/13/23 0500  WBC 10.6* 13.2*  HGB 11.5* 12.5*  HCT 33.3* 36.0*  PLT 327 389   BMET Recent Labs    02/12/23 0412 02/13/23 0500  NA 126* 128*  K 3.9 4.2  CL 96* 97*  CO2 21* 23  GLUCOSE 321* 98  BUN 19 21  CREATININE 0.73 0.86  CALCIUM 7.0* 7.8*   PT/INR No results for input(s): "LABPROT", "INR" in the last 72 hours. ABG No results for input(s): "PHART", "HCO3" in the last 72 hours.  Invalid input(s): "PCO2", "PO2"  Studies/Results: No results found.  Anti-infectives: Anti-infectives (From admission, onward)    Start     Dose/Rate Route Frequency Ordered Stop   02/05/23 1345  ciprofloxacin (CIPRO) IVPB 400 mg        400 mg 200 mL/hr over 60 Minutes Intravenous Every 12 hours 02/05/23 1259 02/11/23 2217   01/28/23 1000  piperacillin-tazobactam (ZOSYN) IVPB 3.375 g  Status:  Discontinued        3.375 g 12.5 mL/hr over 240 Minutes Intravenous Every 8 hours 01/28/23 0902 02/05/23 1259   01/27/23 1030  ceFAZolin (ANCEF) IVPB 2g/100 mL premix       See  Hyperspace for full Linked Orders Report.   2 g 200 mL/hr over 30 Minutes Intravenous On call to O.R. 01/27/23 0746 01/27/23 1135   01/27/23 1030  metroNIDAZOLE (FLAGYL) IVPB 500 mg       See Hyperspace for full Linked Orders Report.   500 mg 100 mL/hr over 60 Minutes Intravenous On call to O.R. 01/27/23 0746 01/27/23 1141   01/19/23 0600  cefTRIAXone (ROCEPHIN) 2 g in sodium chloride 0.9 % 100 mL IVPB       See Hyperspace for full Linked Orders Report.   2 g 200 mL/hr over 30 Minutes Intravenous On call to O.R. 01/19/23 0350 01/19/23 0606   01/19/23 0350  metroNIDAZOLE (FLAGYL) IVPB 500 mg  Status:  Discontinued       See Hyperspace for full Linked Orders Report.   500 mg 100 mL/hr over 60 Minutes Intravenous Every 8 hours 01/19/23 0350 01/19/23 0752       Assessment/Plan: 67 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 4/22 - Ex lap, adhesiolysis, duodenojejunal bypass, duodenostomy tube placement, T tube placement in common bile duct. 4/23 - Takeback for bleeding with hemorrhagic shock, resection of ischemic transverse colon, left in discontinuity with Abthera 4/24 - Takeback for bleeding, abdominal washout, ileocecectomy, pack placement, placement of Abthera 4/25 - Takeback, resection of 10cm necrotic distal ileum, washout, removal of packs, placement of  Tawni Pummel 4/27 - Takeback, placement of J tube, end ileostomy, abdominal closure   - Hyperbilirubinemia: Tube cholangiogram on 5/3 showed patency of the biliary tree with no obstruction. Massive transfusion very likely contributed, TPN off. Trend bili - Keep T tube and D tube to gravity drainage. JP to bulb suction. - Pain: oxycodone, prn dilaudid, scheduled IV robaxin - Agitation/delirium: Improved. Continue Seroquel qhs, Klonopin BID, prn haldol. Delirium precautions. - ID: Pseudomonas pneumonia, resistant to Zosyn and cefepime. Ciprofloxacin started 5/1 for 7-day course. - Wound care: Begin dakins to midline wound for 3 days. - FEN: NPO,  ok for ice chips, TF to goal - WOC following for ostomy care and teaching - PT/OT following. Anticipate CIR at discharge. - VTE: SCDs, lovenox - Dispo: 4NP, therapies rec CIR and they are following progress. I spoke with his wife  LOS: 31 days    Violeta Gelinas, MD, MPH, FACS Trauma & General Surgery Use [email or web address] to contact on call provider  02/13/2023

## 2023-02-13 NOTE — Consult Note (Signed)
WOC Nurse ostomy follow up Stoma type/location: LLQ ileostomy Stomal assessment/size: 1 3/4", well budded, edematous,  Peristomal assessment: some mild erythema but skin intact; midline incision to right of ostomy, drain in pouching field, barrier cut to work around this area  Treatment options for stomal/peristomal skin: applied skin prep to surrounding skin and 2" barrier ring Output 150 mls liquid brown green stool  Ostomy pouching: 2 piece 2 3/4" pouch with barrier ring  Education provided: Wife present for today's visit.  Patient does not participate.  Reviewed with wife that pouch will need to be emptied multiple times a day, when 1/3 to 1/2 full.  We discussed that ileostomies can be high output of liquid stool and that oral intake has to keep with output.  Reviewed that entire pouching system should be changed twice a week and as needed for leaking.  Wife is able to empty pouch using lock and roll mechanism and use toilet paper wick to clean spout.  Wife then removed old pouch using push pull method and cleaned around stoma with water moistened washcloth. We reviewed that the stoma is insensate and may bleed with cleaning which is a normal finding.  We measured stoma at 1 3/4" today and wife cut new skin barrier to fit.  She stretched 2" barrier ring to fit around stoma snugly then secured skin barrier to barrier ring.  Wife able to attach pouch to skin barrier.   We reviewed showering with ostomy pouch on or off. Also reviewed educational materials including step by step instruction on applying a 2 piece pouching system.  Also discussed dietary precautions with ileostomy to avoid food bolus obstruction.  Wife did a great job with ostomy and patient agreed.  Wife states patient will likely go to inpatient rehab when stable for discharge.   Enrolled patient in [employer] Secure Start Discharge program: Yes; did follow-up with hollister today with wife's email and phone number   2 3/4" pouch  [facility] [place] [phone]), 2 3/4: skin barrier (Lawson# 2) and 2"  barrier rings [facility] [place] [phone]) in room   WOC team will continue to follow for ostomy support and education.    Thank you,    Priscella Mann MSN, RN-BC, [employer] [phone]

## 2023-02-13 NOTE — Progress Notes (Signed)
Tube feeding Pivot 1.5 increased to 51ml/hr, HOB at 33 degrees, pt tolerating well

## 2023-02-13 NOTE — Progress Notes (Addendum)
Tube feeding Pivot 1.5 increased to 30 ml/hr per order, HOB greater than 30 degrees.

## 2023-02-13 NOTE — Progress Notes (Signed)
Nutrition Follow-up  DOCUMENTATION CODES:   Severe malnutrition in context of chronic illness  INTERVENTION:   Tube feeding via J-tube: - Increase Pivot 1.5 to 30 ml/hr and continue to advance rate by 10 ml q 4 hours to goal rate of 70 ml/hr (1680 ml/day)  Tube feeding regimen at goal rate provides 2520 kcal, 157 grams of protein, and 1260 ml of H2O.   NUTRITION DIAGNOSIS:   Severe Malnutrition related to chronic illness as evidenced by moderate fat depletion, severe muscle depletion, energy intake < or equal to 75% for > or equal to 1 month, percent weight loss.  Ongoing, being addressed via TF  GOAL:   Patient will meet greater than or equal to 90% of their needs  Met via TF at goal rate  MONITOR:   Labs, Weight trends, TF tolerance, Skin, I & O's  REASON FOR ASSESSMENT:   Consult Enteral/tube feeding initiation and management (advance tube feeds to goal)  ASSESSMENT:   67 yo male who was admitted from home with worsening nausea, vomiting, bloating and weakness. Patient with remote history of multiple prior laparotomies (in the 1970's), with a chronic partial small bowel obstruction.  04/08 - admitted, PICC line placed 04/09 - TPN initiated 04/13 - TPN increased to goal 04/14 - emergent EGD showing oozing gastric ulcer s/p epi and clips 04/15 - IR for emergent embolization, intubated 04/22 - s/p ex lap, LOA, duodenojejunal bypass with biliary T-tube, duodenostomy tube 04/23 - s/p second OR for ex lap with transverse colectomy, abd packed and open abd, left in discontinuity due to colonic ischemia, ligation of bleeding mesenteric vessels 04/24 - OR due to acute decompensation s/p ex lap with ileocectomy, temp abd closure, MTP (did not receive TPN due to contaminated port) 04/25 - s/p ex lap, washout, SBR (10 cm necrotic distal ileum), VAC  04/27 - s/p ileostomy, J-tube placement, abd closure  04/30 - extubated  05/05 - TPN cycled  05/06 - TPN stopped due to elevated  bilirubin 05/07 - trickle TF started via J-tube  Consult received to advance tube feeding regimen to goal. Ileostomy output has increased. Discussed pt with MD and RN.  Spoke with pt and wife at bedside. Pt sleepy at time of RD visit. Explained plan to slowly titrate tube feeding regimen to goal rate. TPN remains off since 5/06 due to elevated Tbili and jaundice.  Admission weight: 68.1 kg  Current weight: 72 kg  Current TF: Pivot 1.5 @ 20 ml/hr via J-tube  Medications reviewed and include: IV protonix IVF: D5 in 1/2NS @ 75 ml/hr  Labs reviewed: sodium 128, chloride 97, phosphorus 4.7, magnesium 1.6, elevated LFTs, Tbili 29.5, WBC 13.2  UOP: 1450 ml x 24 hours Biliary T tube: 725 ml x 24 hours L abd JP: 120 ml x 24 hours 24 F RUQ: 575 ml x 24 hours Ileostomy: 520 ml x 24 hours 16 F J-tube: using for feeding I/O's: +3.3 L since admit   Diet Order:   Diet Order             Diet NPO time specified Except for: Ice Chips  Diet effective now                   EDUCATION NEEDS:   Education needs have been addressed  Skin:  Skin Assessment: Skin Integrity Issues: Stage I: vertebral column  Last BM:  02/13/23 520 ml via ileostomy x 24 hours  Height:   Ht Readings from Last 1 Encounters:  01/27/23 6\' 1"  (1.854 m)    Weight:   Wt Readings from Last 1 Encounters:  02/13/23 72 kg    BMI:  Body mass index is 20.94 kg/m.  Estimated Nutritional Needs:   Kcal:  2300-2600  Protein:  130-145 grams  Fluid:  2.1L/day    Mertie Clause, MS, RD, LDN Inpatient Clinical Dietitian Please see AMiON for contact information.

## 2023-02-13 NOTE — Progress Notes (Signed)
Tube feeding Pivot 1.5 increased to 28ml/hr, pt tolerated well, no signs of discomfort, HOB 33degrees.

## 2023-02-13 NOTE — Progress Notes (Signed)
Pt CBG taken using blood from PICC line due to multiple finger pricks, causing high reading

## 2023-02-13 NOTE — Progress Notes (Signed)
Inpatient Rehabilitation Admissions Coordinator   Met at bedside with patient ,wife and OT. I will follow at a distance as we await medical readiness to pursue CIR admit with insurance.  Ottie Glazier, RN, MSN Rehab Admissions Coordinator [phone] 02/13/2023 12:31 PM

## 2023-02-14 ENCOUNTER — Inpatient Hospital Stay (HOSPITAL_COMMUNITY): Payer: BC Managed Care – PPO

## 2023-02-14 LAB — GLUCOSE, CAPILLARY
Glucose-Capillary: 104 mg/dL — ABNORMAL HIGH (ref 70–99)
Glucose-Capillary: 105 mg/dL — ABNORMAL HIGH (ref 70–99)
Glucose-Capillary: 103 mg/dL — ABNORMAL HIGH (ref 70–99)
Glucose-Capillary: 91 mg/dL (ref 70–99)
Glucose-Capillary: 113 mg/dL — ABNORMAL HIGH (ref 70–99)
Glucose-Capillary: 92 mg/dL (ref 70–99)

## 2023-02-14 LAB — CBC
HCT: 35.2 % — ABNORMAL LOW (ref 39.0–52.0)
Hemoglobin: 12.3 g/dL — ABNORMAL LOW (ref 13.0–17.0)
MCH: 29.5 pg (ref 26.0–34.0)
MCHC: 34.9 g/dL (ref 30.0–36.0)
MCV: 84.4 fL (ref 80.0–100.0)
Platelets: 394 10*3/uL (ref 150–400)
RBC: 4.17 MIL/uL — ABNORMAL LOW (ref 4.22–5.81)
RDW: 20.7 % — ABNORMAL HIGH (ref 11.5–15.5)
WBC: 13.2 10*3/uL — ABNORMAL HIGH (ref 4.0–10.5)
nRBC: 0 % (ref 0.0–0.2)

## 2023-02-14 LAB — TROPONIN I (HIGH SENSITIVITY)
Troponin I (High Sensitivity): 11 ng/L (ref <18)
Troponin I (High Sensitivity): 11 ng/L (ref <18)

## 2023-02-14 MED ORDER — MAGNESIUM SULFATE 4 GM/100ML IV SOLN
4.0000 g | Freq: Once | INTRAVENOUS | Status: AC
  Administered 2023-02-14: 4 g via INTRAVENOUS
  Filled 2023-02-14: qty 100

## 2023-02-14 MED ORDER — SODIUM CHLORIDE 0.9 % IV BOLUS
1000.0000 mL | Freq: Once | INTRAVENOUS | Status: AC
  Administered 2023-02-14: 1000 mL via INTRAVENOUS

## 2023-02-14 NOTE — Progress Notes (Signed)
Pt was ringing out ST elevation on the monitor, EKG preformed and resulted with STEMI, Rapid Response nurse called, Cards called, they assessed Pt, did a repeat EKG still showing STEMI, Troponin drawn and mag ordered, Cards stated they will monitor, no new orders

## 2023-02-14 NOTE — Progress Notes (Signed)
Tube feeding on hold at this time for US liver.

## 2023-02-14 NOTE — Progress Notes (Signed)
[place] of liver completed, tube feeding Pivot 1.5 restarted at 16ml/hr, HOB greater than 30 degrees, wife at bedside.

## 2023-02-14 NOTE — Progress Notes (Signed)
Wife notified us is not able to perform [place] of liver at this time, they will try to get it done as soon as possible, wife in agreement to continue to hold tube feedings until 1900 in hopes [place] can get done.

## 2023-02-14 NOTE — Significant Event (Signed)
Rapid Response Event Note   Reason for Call :  STE alarming on monitor, EKG obtained showing:   "Sinus tachycardia with 1st degree A-V block. Possible Left atrial enlargement Inferior infarct , possibly acute Lateral injury pattern. ACUTE MI / STEMI "  Initial Focused Assessment:  Pt lying in bed with eyes closed, in no visible distress. He is alert to self, confused. He is c/o mild back pain but denies CP/SOB/dizziness. Lungs clear/diminished. Skin hot to touch, dry.   T-98.7(Ax and orally), HR-107, BP-90/59, RR-20, SpO2-93% on RA.   Interventions:  EKG Trop q2h x 2 4g Mg IV Cards consulted Plan of Care:  Repeat EKG looked better than previous one. No plans for CCL at this time. Await Trop results. Give Mg IV. Continue to monitor pt closely. Call RRT if further assistance needed.   Event Summary:   MD Notified: Dr. Sheliah Hatch notified, Clayburn Pert, Cardiology PA asked to view EKG and came to see pt  Call Time:0611 Arrival [id number] End Time:0700  Terrilyn Saver, RN

## 2023-02-14 NOTE — Progress Notes (Signed)
    Cardiology paged this morning by rapid response regarding abnormal ECG. Patient admitted with duodenal obstruction was noted to have new ST segment elevation on telemetry. This prompted patient's nurse to obtain ECG which was concerning for inferior/lateral infarct. Given concerning appearance of ECG, I notified Dr. Lynnette Caffey of [facility] interventional team. Patient examined by both myself and Dr. Lynnette Caffey. Patient appears chronically ill. Denies chest pain, shortness of breath, palpitations. History limited by sedation and confusion. Patient's only complaint this AM is lower back pain and this is apparently not a new symptom. Repeat ECG shows reduction in ST segment changes. At this time, do not believe this represents true STEMI/ACS event. Magnesium noted to be low at 1.6 yesterday. Check electrolytes, maintain K>4, Mg>2. HsTroponin ordered by primary team. Cardiology can formally consult later today if felt necessary by primary team or if there are new/concerning symptoms.   Limited physical exam:  GEN: chronically ill and frail appearing, in no acute distress HEENT: Normal NECK: No JVD; No carotid bruits CARDIAC: slightly tachycardic, regular, no murmurs, rubs, gallops RESPIRATORY:  no wheezing, rales, rhonchi noted in anterior lung [place] MUSCULOSKELETAL:  No edema SKIN: Warm and dry       Perlie Gold, PA-C

## 2023-02-14 NOTE — Plan of Care (Signed)
  Problem: Education: Goal: Knowledge of General Education information will improve Description: Including pain rating scale, medication(s)/side effects and non-pharmacologic comfort measures Outcome: Progressing   Problem: Health Behavior/Discharge Planning: Goal: Ability to manage health-related needs will improve Outcome: Progressing   Problem: Clinical Measurements: Goal: Ability to maintain clinical measurements within normal limits will improve Outcome: Progressing Goal: Will remain free from infection Outcome: Progressing Goal: Diagnostic test results will improve Outcome: Progressing Goal: Respiratory complications will improve Outcome: Progressing Goal: Cardiovascular complication will be avoided Outcome: Progressing   Problem: Activity: Goal: Risk for activity intolerance will decrease Outcome: Progressing   Problem: Nutrition: Goal: Adequate nutrition will be maintained Outcome: Progressing   Problem: Coping: Goal: Level of anxiety will decrease Outcome: Progressing   Problem: Elimination: Goal: Will not experience complications related to bowel motility Outcome: Progressing Goal: Will not experience complications related to urinary retention Outcome: Progressing   Problem: Pain Managment: Goal: General experience of comfort will improve Outcome: Progressing   Problem: Safety: Goal: Ability to remain free from injury will improve Outcome: Progressing   Problem: Skin Integrity: Goal: Risk for impaired skin integrity will decrease Outcome: Progressing   Problem: Education: Goal: Ability to describe self-care measures that may prevent or decrease complications (Diabetes Survival Skills Education) will improve Outcome: Progressing Goal: Individualized Educational Video(s) Outcome: Progressing   Problem: Coping: Goal: Ability to adjust to condition or change in health will improve Outcome: Progressing   Problem: Fluid Volume: Goal: Ability to  maintain a balanced intake and output will improve Outcome: Progressing   Problem: Health Behavior/Discharge Planning: Goal: Ability to identify and utilize available resources and services will improve Outcome: Progressing Goal: Ability to manage health-related needs will improve Outcome: Progressing   Problem: Metabolic: Goal: Ability to maintain appropriate glucose levels will improve Outcome: Progressing   Problem: Nutritional: Goal: Maintenance of adequate nutrition will improve Outcome: Progressing Goal: Progress toward achieving an optimal weight will improve Outcome: Progressing   Problem: Skin Integrity: Goal: Risk for impaired skin integrity will decrease Outcome: Progressing   Problem: Tissue Perfusion: Goal: Adequacy of tissue perfusion will improve Outcome: Progressing   Problem: Education: Goal: Understanding of CV disease, CV risk reduction, and recovery process will improve Outcome: Progressing Goal: Individualized Educational Video(s) Outcome: Progressing   Problem: Activity: Goal: Ability to return to baseline activity level will improve Outcome: Progressing   Problem: Cardiovascular: Goal: Ability to achieve and maintain adequate cardiovascular perfusion will improve Outcome: Progressing Goal: Vascular access site(s) Level 0-1 will be maintained Outcome: Progressing   Problem: Health Behavior/Discharge Planning: Goal: Ability to safely manage health-related needs after discharge will improve Outcome: Progressing   Problem: Cardiac: Goal: Ability to maintain an adequate cardiac output will improve Outcome: Progressing   Problem: Health Behavior/Discharge Planning: Goal: Ability to identify and utilize available resources and services will improve Outcome: Progressing Goal: Ability to manage health-related needs will improve Outcome: Progressing   Problem: Fluid Volume: Goal: Ability to achieve a balanced intake and output will  improve Outcome: Progressing   Problem: Metabolic: Goal: Ability to maintain appropriate glucose levels will improve Outcome: Progressing   Problem: Nutritional: Goal: Maintenance of adequate nutrition will improve Outcome: Progressing Goal: Maintenance of adequate weight for body size and type will improve Outcome: Progressing   Problem: Respiratory: Goal: Will regain and/or maintain adequate ventilation Outcome: Progressing   Problem: Urinary Elimination: Goal: Ability to achieve and maintain adequate renal perfusion and functioning will improve Outcome: Progressing

## 2023-02-14 NOTE — Progress Notes (Signed)
Patient ID: Bradley Dominguez, male   DOB: 15-Aug-1956, 67 y.o.   MRN: [id number] 13 Days Post-Op    Subjective: No CP, worked with PT yesterday ROS negative except as listed above. Objective: Vital signs in last 24 hours: Temp:  [97.8 F (36.6 C)-99 F (37.2 C)] 98.6 F (37 C) (05/10 0755) Pulse Rate:  [100-108] 104 (05/10 0755) Resp:  [17-20] 17 (05/10 0755) BP: (91-107)/(61-65) 91/65 (05/10 0755) SpO2:  [93 %-96 %] 96 % (05/10 0755) Weight:  [68.7 kg] 68.7 kg (05/10 0500) Last BM Date : 02/13/23  Intake/Output from previous day: 05/09 0701 - 05/10 0700 In: 1702.3 [I.V.:315.8; NG/GT:1386.5] Out: 3800 [Urine:1050; Drains:2550; Stool:200] Intake/Output this shift: Total I/O In: 60 [Other:60] Out: 250 [Drains:250]  General appearance: alert and cooperative Resp: clear to auscultation bilaterally GI: soft, ostomy, D tube J tube T tube, wound OK, JP bilious  Lab Results: CBC  Recent Labs    02/13/23 0500 02/14/23 0500  WBC 13.2* 13.2*  HGB 12.5* 12.3*  HCT 36.0* 35.2*  PLT 389 394   BMET Recent Labs    02/12/23 0412 02/13/23 0500  NA 126* 128*  K 3.9 4.2  CL 96* 97*  CO2 21* 23  GLUCOSE 321* 98  BUN 19 21  CREATININE 0.73 0.86  CALCIUM 7.0* 7.8*   PT/INR No results for input(s): "LABPROT", "INR" in the last 72 hours. ABG No results for input(s): "PHART", "HCO3" in the last 72 hours.  Invalid input(s): "PCO2", "PO2"  Studies/Results: No results found.  Anti-infectives: Anti-infectives (From admission, onward)    Start     Dose/Rate Route Frequency Ordered Stop   02/05/23 1345  ciprofloxacin (CIPRO) IVPB 400 mg        400 mg 200 mL/hr over 60 Minutes Intravenous Every 12 hours 02/05/23 1259 02/11/23 2217   01/28/23 1000  piperacillin-tazobactam (ZOSYN) IVPB 3.375 g  Status:  Discontinued        3.375 g 12.5 mL/hr over 240 Minutes Intravenous Every 8 hours 01/28/23 0902 02/05/23 1259   01/27/23 1030  ceFAZolin (ANCEF) IVPB 2g/100 mL premix       See  Hyperspace for full Linked Orders Report.   2 g 200 mL/hr over 30 Minutes Intravenous On call to O.R. 01/27/23 0746 01/27/23 1135   01/27/23 1030  metroNIDAZOLE (FLAGYL) IVPB 500 mg       See Hyperspace for full Linked Orders Report.   500 mg 100 mL/hr over 60 Minutes Intravenous On call to O.R. 01/27/23 0746 01/27/23 1141   01/19/23 0600  cefTRIAXone (ROCEPHIN) 2 g in sodium chloride 0.9 % 100 mL IVPB       See Hyperspace for full Linked Orders Report.   2 g 200 mL/hr over 30 Minutes Intravenous On call to O.R. 01/19/23 0350 01/19/23 0606   01/19/23 0350  metroNIDAZOLE (FLAGYL) IVPB 500 mg  Status:  Discontinued       See Hyperspace for full Linked Orders Report.   500 mg 100 mL/hr over 60 Minutes Intravenous Every 8 hours 01/19/23 0350 01/19/23 0752       Assessment/Plan: 67 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 4/22 - Ex lap, adhesiolysis, duodenojejunal bypass, duodenostomy tube placement, T tube placement in common bile duct. 4/23 - Takeback for bleeding with hemorrhagic shock, resection of ischemic transverse colon, left in discontinuity with Abthera 4/24 - Takeback for bleeding, abdominal washout, ileocecectomy, pack placement, placement of Abthera 4/25 - Takeback, resection of 10cm necrotic distal ileum, washout, removal of packs, placement of Abthera  4/27 - Takeback, placement of J tube, end ileostomy, abdominal closure   - Hyperbilirubinemia: Tube cholangiogram on 5/3 showed patency of the biliary tree with no obstruction. Massive transfusion very likely contributed, will get duplex of liver today - Keep T tube and D tube to gravity drainage. JP to bulb suction. - Pain: oxycodone, prn dilaudid, scheduled IV robaxin - Agitation/delirium: Improved. wean Seroquel qhs, Klonopin BID, prn haldol. - ID: Pseudomonas pneumonia, resistant to Zosyn and cefepime. Ciprofloxacin started 5/1 for 7-day course. - Wound care: Begin dakins to midline wound for 3 days. - FEN: NPO, ok for ice  chips, TF to goal - WOC following for ostomy care and teaching - PT/OT following. Anticipate CIR at discharge. - VTE: SCDs, lovenox - Dispo: 4NP, therapies rec CIR and they are following progress. I spoke with his wife. 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 I will ask RD.   LOS: 32 days    Violeta Gelinas, MD, MPH, FACS Trauma & General Surgery Use [email or web address] to contact on call provider  02/14/2023

## 2023-02-15 LAB — CBC
HCT: 32 % — ABNORMAL LOW (ref 39.0–52.0)
Hemoglobin: 10.9 g/dL — ABNORMAL LOW (ref 13.0–17.0)
MCH: 29.6 pg (ref 26.0–34.0)
MCHC: 34.1 g/dL (ref 30.0–36.0)
MCV: 87 fL (ref 80.0–100.0)
Platelets: 341 10*3/uL (ref 150–400)
RBC: 3.68 MIL/uL — ABNORMAL LOW (ref 4.22–5.81)
RDW: 20.8 % — ABNORMAL HIGH (ref 11.5–15.5)
WBC: 14.2 10*3/uL — ABNORMAL HIGH (ref 4.0–10.5)
nRBC: 0 % (ref 0.0–0.2)

## 2023-02-15 LAB — BASIC METABOLIC PANEL
Anion gap: 10 (ref 5–15)
BUN: 42 mg/dL — ABNORMAL HIGH (ref 8–23)
CO2: 18 mmol/L — ABNORMAL LOW (ref 22–32)
Calcium: 7 mg/dL — ABNORMAL LOW (ref 8.9–10.3)
Chloride: 101 mmol/L (ref 98–111)
Creatinine, Ser: 1.85 mg/dL — ABNORMAL HIGH (ref 0.61–1.24)
GFR, Estimated: 40 mL/min — ABNORMAL LOW (ref >60)
Glucose, Bld: 378 mg/dL — ABNORMAL HIGH (ref 70–99)
Potassium: 3.9 mmol/L (ref 3.5–5.1)
Sodium: 129 mmol/L — ABNORMAL LOW (ref 135–145)

## 2023-02-15 LAB — GLUCOSE, CAPILLARY
Glucose-Capillary: 92 mg/dL (ref 70–99)
Glucose-Capillary: 101 mg/dL — ABNORMAL HIGH (ref 70–99)
Glucose-Capillary: 104 mg/dL — ABNORMAL HIGH (ref 70–99)
Glucose-Capillary: 84 mg/dL (ref 70–99)
Glucose-Capillary: 91 mg/dL (ref 70–99)

## 2023-02-15 MED ORDER — IBUPROFEN 200 MG PO TABS
400.0000 mg | ORAL_TABLET | Freq: Four times a day (QID) | ORAL | Status: DC | PRN
  Administered 2023-02-15: 400 mg via ORAL
  Filled 2023-02-15: qty 2

## 2023-02-15 MED ORDER — HYDROMORPHONE HCL 1 MG/ML IJ SOLN
0.5000 mg | Freq: Four times a day (QID) | INTRAMUSCULAR | Status: DC | PRN
  Administered 2023-02-15 – 2023-02-17 (×4): 0.5 mg via INTRAVENOUS
  Filled 2023-02-15 (×4): qty 0.5

## 2023-02-15 MED ORDER — METHOCARBAMOL 500 MG PO TABS
1000.0000 mg | ORAL_TABLET | Freq: Three times a day (TID) | ORAL | Status: DC
  Administered 2023-02-15 – 2023-02-17 (×6): 1000 mg via ORAL
  Filled 2023-02-15 (×6): qty 2

## 2023-02-15 MED ORDER — ALBUMIN HUMAN 5 % IV SOLN
25.0000 g | Freq: Once | INTRAVENOUS | Status: AC
  Administered 2023-02-15: 25 g via INTRAVENOUS
  Filled 2023-02-15: qty 500

## 2023-02-15 MED ORDER — ALBUMIN HUMAN 5 % IV SOLN
25.0000 g | Freq: Once | INTRAVENOUS | Status: DC
  Filled 2023-02-15: qty 500

## 2023-02-15 MED ORDER — LACTATED RINGERS IV BOLUS: 1000.0000 mL | Freq: Once | INTRAVENOUS | Status: DC

## 2023-02-15 MED ORDER — SODIUM CHLORIDE 0.9 % IV BOLUS
1000.0000 mL | Freq: Once | INTRAVENOUS | Status: AC
  Administered 2023-02-15: 1000 mL via INTRAVENOUS

## 2023-02-15 MED ORDER — SODIUM CHLORIDE 0.9 % IV SOLN: INTRAVENOUS | Status: DC

## 2023-02-15 MED ORDER — FREE WATER
200.0000 mL | Status: DC
  Administered 2023-02-15 (×2): 200 mL

## 2023-02-15 MED ORDER — FREE WATER
200.0000 mL | Status: DC
  Administered 2023-02-15 – 2023-02-18 (×14): 200 mL

## 2023-02-15 NOTE — Progress Notes (Signed)
Trauma/Critical Care Follow Up Note  Subjective:    Overnight Issues:   Objective:  Vital signs for last 24 hours: Temp:  [98 F (36.7 C)-98.7 F (37.1 C)] 98.7 F (37.1 C) (05/11 0300) Pulse Rate:  [93-108] 101 (05/11 0700) Resp:  [17-26] 19 (05/11 0700) BP: (84-106)/(51-63) 95/53 (05/11 0700) SpO2:  [93 %-98 %] 96 % (05/11 0700) Weight:  [68.7 kg] 68.7 kg (05/11 0500)  Hemodynamic parameters for last 24 hours:    Intake/Output from previous day: 05/10 0701 - 05/11 0700 In: 60  Out: 4255 [Urine:650; Drains:3605]  Intake/Output this shift: Total I/O In: -  Out: 125 [Urine:125]  Vent settings for last 24 hours:    Physical Exam:  Gen: comfortable, no distress Neuro: follows commands, alert, communicative HEENT: PERRL Neck: supple CV: RRR Pulm: unlabored breathing on RA Abd: soft, NT, midline wound with granulation tissue, ostomy productive GU: urine dark and yellow, +Foley Extr: wwp, no edema  Results for orders placed or performed during the hospital encounter of 01/13/23 (from the past 24 hour(s))  Troponin I (High Sensitivity)     Status: None   Collection Time: 02/14/23 10:23 AM  Result Value Ref Range   Troponin I (High Sensitivity) 11 <18 ng/L  Glucose, capillary     Status: Abnormal   Collection Time: 02/14/23 12:10 PM  Result Value Ref Range   Glucose-Capillary 103 (H) 70 - 99 mg/dL  Glucose, capillary     Status: None   Collection Time: 02/14/23  3:24 PM  Result Value Ref Range   Glucose-Capillary 91 70 - 99 mg/dL  Glucose, capillary     Status: Abnormal   Collection Time: 02/14/23  7:36 PM  Result Value Ref Range   Glucose-Capillary 113 (H) 70 - 99 mg/dL  Glucose, capillary     Status: None   Collection Time: 02/14/23 11:18 PM  Result Value Ref Range   Glucose-Capillary 92 70 - 99 mg/dL  Glucose, capillary     Status: None   Collection Time: 02/15/23  3:04 AM  Result Value Ref Range   Glucose-Capillary 92 70 - 99 mg/dL  CBC     Status:  Abnormal   Collection Time: 02/15/23  3:52 AM  Result Value Ref Range   WBC 14.2 (H) 4.0 - 10.5 K/uL   RBC 3.68 (L) 4.22 - 5.81 MIL/uL   Hemoglobin 10.9 (L) 13.0 - 17.0 g/dL   HCT 16.1 (L) 09.6 - 04.5 %   MCV 87.0 80.0 - 100.0 fL   MCH 29.6 26.0 - 34.0 pg   MCHC 34.1 30.0 - 36.0 g/dL   RDW 40.9 (H) 81.1 - 91.4 %   Platelets 341 150 - 400 K/uL   nRBC 0.0 0.0 - 0.2 %  Basic metabolic panel     Status: Abnormal   Collection Time: 02/15/23  3:52 AM  Result Value Ref Range   Sodium 129 (L) 135 - 145 mmol/L   Potassium 3.9 3.5 - 5.1 mmol/L   Chloride 101 98 - 111 mmol/L   CO2 18 (L) 22 - 32 mmol/L   Glucose, Bld 378 (H) 70 - 99 mg/dL   BUN 42 (H) 8 - 23 mg/dL   Creatinine, Ser 7.82 (H) 0.61 - 1.24 mg/dL   Calcium 7.0 (L) 8.9 - 10.3 mg/dL   GFR, Estimated 40 (L) >60 mL/min   Anion gap 10 5 - 15  Glucose, capillary     Status: Abnormal   Collection Time: 02/15/23  8:38 AM  Result Value Ref Range   Glucose-Capillary 101 (H) 70 - 99 mg/dL   Comment 1 Document in Chart     Assessment & Plan: The plan of care was discussed with the bedside nurse for the day, who is in agreement with this plan and no additional concerns were raised.   Present on Admission:  Bowel obstruction (HCC)  Protein-calorie malnutrition, severe (HCC)  Duodenal obstruction    LOS: 33 days   Additional comments:I reviewed the patient's new clinical lab test results.   and I reviewed the patients new imaging test results.    67 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 4/22 - Ex lap, adhesiolysis, duodenojejunal bypass, duodenostomy tube placement, T tube placement in common bile duct. 4/23 - Takeback for bleeding with hemorrhagic shock, resection of ischemic transverse colon, left in discontinuity with Abthera 4/24 - Takeback for bleeding, abdominal washout, ileocecectomy, pack placement, placement of Abthera 4/25 - Takeback, resection of 10cm necrotic distal ileum, washout, removal of packs, placement of  Abthera 4/27 - Takeback, placement of J tube, end ileostomy, abdominal closure   - Hyperbilirubinemia: Tube cholangiogram on 5/3 showed patency of the biliary tree with no obstruction. Massive transfusion very likely contributed, will get duplex of liver today - Keep T tube and D tube to gravity drainage. JP to bulb suction. - Pain: oxycodone, prn dilaudid, scheduled IV robaxin - Agitation/delirium: Improved. wean Seroquel qhs, Klonopin BID, prn haldol. - ID: Pseudomonas pneumonia, resistant to Zosyn and cefepime. Ciprofloxacin started 5/1 for 7-day course. - Wound care: dakins to midline wound for 3 days. - FEN: NPO, ok for ice chips, TF at goal, change MIVF to NS due to hyponatremia, wil also give additional NS bolus. Add FW for volume expansion, will d/w Dr. Freida Busman re: re-feeding bile from T/D tubes as >2L o/p in 24h - WOC following for ostomy care and teaching - PT/OT following. Anticipate CIR at discharge. - VTE: SCDs, lovenox - Dispo: 4NP, therapies rec CIR and they are following progress.   Diamantina Monks, MD Trauma & General Surgery Please use [email or web address] to contact on call provider  02/15/2023  *Care during the described time interval was provided by me. I have reviewed this patient's available data, including medical history, events of note, physical examination and test results as part of my evaluation.

## 2023-02-15 NOTE — Progress Notes (Addendum)
2218 BP 84/51 (61) paged Twana First MD. She verbally ordered 1L NS bolus   0604 BP 88/51 (62) pages Twana First MD, verbal order for another 1L NS bolus

## 2023-02-16 LAB — BASIC METABOLIC PANEL
Anion gap: 6 (ref 5–15)
BUN: 50 mg/dL — ABNORMAL HIGH (ref 8–23)
CO2: 15 mmol/L — ABNORMAL LOW (ref 22–32)
Calcium: 6.4 mg/dL — CL (ref 8.9–10.3)
Chloride: 115 mmol/L — ABNORMAL HIGH (ref 98–111)
Creatinine, Ser: 2.15 mg/dL — ABNORMAL HIGH (ref 0.61–1.24)
GFR, Estimated: 33 mL/min — ABNORMAL LOW (ref >60)
Glucose, Bld: 95 mg/dL (ref 70–99)
Potassium: 3.5 mmol/L (ref 3.5–5.1)
Sodium: 136 mmol/L (ref 135–145)

## 2023-02-16 LAB — CBC
HCT: 25.6 % — ABNORMAL LOW (ref 39.0–52.0)
Hemoglobin: 8.4 g/dL — ABNORMAL LOW (ref 13.0–17.0)
MCH: 28.5 pg (ref 26.0–34.0)
MCHC: 32.8 g/dL (ref 30.0–36.0)
MCV: 86.8 fL (ref 80.0–100.0)
Platelets: 239 10*3/uL (ref 150–400)
RBC: 2.95 MIL/uL — ABNORMAL LOW (ref 4.22–5.81)
RDW: 21.2 % — ABNORMAL HIGH (ref 11.5–15.5)
WBC: 11.7 10*3/uL — ABNORMAL HIGH (ref 4.0–10.5)
nRBC: 0 % (ref 0.0–0.2)

## 2023-02-16 LAB — GLUCOSE, CAPILLARY
Glucose-Capillary: 89 mg/dL (ref 70–99)
Glucose-Capillary: 99 mg/dL (ref 70–99)
Glucose-Capillary: 100 mg/dL — ABNORMAL HIGH (ref 70–99)

## 2023-02-16 MED ORDER — CALCIUM GLUCONATE-NACL 2-0.675 GM/100ML-% IV SOLN
2.0000 g | Freq: Once | INTRAVENOUS | Status: AC
  Administered 2023-02-16: 2000 mg via INTRAVENOUS
  Filled 2023-02-16: qty 100

## 2023-02-16 MED ORDER — ALBUMIN HUMAN 5 % IV SOLN
25.0000 g | Freq: Once | INTRAVENOUS | Status: AC
  Administered 2023-02-16: 25 g via INTRAVENOUS
  Filled 2023-02-16: qty 500

## 2023-02-16 NOTE — Progress Notes (Signed)
Trauma/Critical Care Follow Up Note  Subjective:    Overnight Issues: Ongoing issues with hypotension.  He received albumin boluses overnight.  Objective:  Vital signs for last 24 hours: Temp:  [97.5 F (36.4 C)-98.4 F (36.9 C)] 98.1 F (36.7 C) (05/12 0845) Pulse Rate:  [81-104] 98 (05/12 0845) Resp:  [13-24] 16 (05/12 0845) BP: (80-105)/(39-65) 95/64 (05/12 0845) SpO2:  [89 %-99 %] 98 % (05/12 0845) Weight:  [68.5 kg] 68.5 kg (05/12 0500)   Intake/Output from previous day: 05/11 0701 - 05/12 0700 In: 7039 [I.V.:1915.9; ZO/XW:9604.5; IV Piggyback:421.2] Out: 5200 [Urine:1500; Drains:3475; Stool:225]  Intake/Output this shift: Total I/O In: 30 [Other:30] Out: 105 [Drains:105]  Physical Exam:  Gen: comfortable, no distress Neuro: follows commands, alert, communicative HEENT: PERRL Neck: supple CV: RRR Pulm: unlabored breathing on RA Abd: soft, NT, midline wound with fibrinous eschar , ostomy productive , right upper quadrant T-tube with bilious output, JP drain with murky blood-tinged output, duodenostomy tube output similar to JP drain output. GU: urine dark and yellow, +Foley Extr: wwp, no edema  Results for orders placed or performed during the hospital encounter of 01/13/23 (from the past 24 hour(s))  Glucose, capillary     Status: Abnormal   Collection Time: 02/15/23  5:32 PM  Result Value Ref Range   Glucose-Capillary 104 (H) 70 - 99 mg/dL   Comment 1 Document in Chart   Glucose, capillary     Status: None   Collection Time: 02/15/23  7:41 PM  Result Value Ref Range   Glucose-Capillary 84 70 - 99 mg/dL  Glucose, capillary     Status: None   Collection Time: 02/15/23 11:24 PM  Result Value Ref Range   Glucose-Capillary 91 70 - 99 mg/dL  CBC     Status: Abnormal   Collection Time: 02/16/23  3:16 AM  Result Value Ref Range   WBC 11.7 (H) 4.0 - 10.5 K/uL   RBC 2.95 (L) 4.22 - 5.81 MIL/uL   Hemoglobin 8.4 (L) 13.0 - 17.0 g/dL   HCT 40.9 (L) 81.1 - 91.4  %   MCV 86.8 80.0 - 100.0 fL   MCH 28.5 26.0 - 34.0 pg   MCHC 32.8 30.0 - 36.0 g/dL   RDW 78.2 (H) 95.6 - 21.3 %   Platelets 239 150 - 400 K/uL   nRBC 0.0 0.0 - 0.2 %  Basic metabolic panel     Status: Abnormal   Collection Time: 02/16/23  3:16 AM  Result Value Ref Range   Sodium 136 135 - 145 mmol/L   Potassium 3.5 3.5 - 5.1 mmol/L   Chloride 115 (H) 98 - 111 mmol/L   CO2 15 (L) 22 - 32 mmol/L   Glucose, Bld 95 70 - 99 mg/dL   BUN 50 (H) 8 - 23 mg/dL   Creatinine, Ser 0.86 (H) 0.61 - 1.24 mg/dL   Calcium 6.4 (LL) 8.9 - 10.3 mg/dL   GFR, Estimated 33 (L) >60 mL/min   Anion gap 6 5 - 15  Glucose, capillary     Status: None   Collection Time: 02/16/23  9:12 AM  Result Value Ref Range   Glucose-Capillary 89 70 - 99 mg/dL    Assessment & Plan: The plan of care was discussed with the bedside nurse for the day, who is in agreement with this plan and no additional concerns were raised.   Present on Admission:  Bowel obstruction (HCC)  Protein-calorie malnutrition, severe (HCC)  Duodenal obstruction    LOS: 34  days   Additional comments:I reviewed the patient's new clinical lab test results.   and I reviewed the patients new imaging test results.    67 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 4/22 - Ex lap, adhesiolysis, duodenojejunal bypass, duodenostomy tube placement, T tube placement in common bile duct. 4/23 - Takeback for bleeding with hemorrhagic shock, resection of ischemic transverse colon, left in discontinuity with Abthera 4/24 - Takeback for bleeding, abdominal washout, ileocecectomy, pack placement, placement of Abthera 4/25 - Takeback, resection of 10cm necrotic distal ileum, washout, removal of packs, placement of Abthera 4/27 - Takeback, placement of J tube, end ileostomy, abdominal closure   - Hyperbilirubinemia: Tube cholangiogram on 5/3 showed patency of the biliary tree with no obstruction. Massive transfusion very likely contributed, liver duplex with no vascular  etiology - Keep T tube and D tube to gravity drainage. JP to bulb suction. - Pain: oxycodone, prn dilaudid, scheduled IV robaxin - Agitation/delirium: Improved. wean Seroquel qhs, Klonopin BID, prn haldol. - ID: Pseudomonas pneumonia, resistant to Zosyn and cefepime. Ciprofloxacin started 5/1 for 7-day course. - Wound care: Stop the Dakin's was started on Monday per patient's wife.  Damp to dry. - FEN: NPO, ok for ice chips, TF at goal, change MIVF to NS due to hyponatremia/ Added FW 5/11 for volume expansion,  re-feeding bile from T tube via J tube as >2L o/p in 24h - WOC following for ostomy care and teaching - PT/OT following. Anticipate CIR at discharge. - VTE: SCDs, lovenox - Dispo: 4NP, therapies rec CIR and they are following progress.   Berna Bue  MD Trauma & General Surgery Please use [email or web address] to contact on call provider  02/16/2023  *Care during the described time interval was provided by me. I have reviewed this patient's available data, including medical history, events of note, physical examination and test results as part of my evaluation.

## 2023-02-16 NOTE — Plan of Care (Signed)
  Problem: Education: Goal: Knowledge of General Education information will improve Description: Including pain rating scale, medication(s)/side effects and non-pharmacologic comfort measures Outcome: Progressing   Problem: Health Behavior/Discharge Planning: Goal: Ability to manage health-related needs will improve Outcome: Progressing   Problem: Clinical Measurements: Goal: Ability to maintain clinical measurements within normal limits will improve Outcome: Progressing Goal: Will remain free from infection Outcome: Progressing Goal: Diagnostic test results will improve Outcome: Progressing Goal: Respiratory complications will improve Outcome: Progressing Goal: Cardiovascular complication will be avoided Outcome: Progressing   Problem: Activity: Goal: Risk for activity intolerance will decrease Outcome: Progressing   Problem: Nutrition: Goal: Adequate nutrition will be maintained Outcome: Progressing   Problem: Coping: Goal: Level of anxiety will decrease Outcome: Progressing   Problem: Elimination: Goal: Will not experience complications related to bowel motility Outcome: Progressing Goal: Will not experience complications related to urinary retention Outcome: Progressing   Problem: Pain Managment: Goal: General experience of comfort will improve Outcome: Progressing   Problem: Safety: Goal: Ability to remain free from injury will improve Outcome: Progressing   Problem: Skin Integrity: Goal: Risk for impaired skin integrity will decrease Outcome: Progressing   Problem: Education: Goal: Ability to describe self-care measures that may prevent or decrease complications (Diabetes Survival Skills Education) will improve Outcome: Progressing Goal: Individualized Educational Video(s) Outcome: Progressing   Problem: Coping: Goal: Ability to adjust to condition or change in health will improve Outcome: Progressing   Problem: Fluid Volume: Goal: Ability to  maintain a balanced intake and output will improve Outcome: Progressing   Problem: Health Behavior/Discharge Planning: Goal: Ability to identify and utilize available resources and services will improve Outcome: Progressing Goal: Ability to manage health-related needs will improve Outcome: Progressing   Problem: Metabolic: Goal: Ability to maintain appropriate glucose levels will improve Outcome: Progressing   Problem: Nutritional: Goal: Maintenance of adequate nutrition will improve Outcome: Progressing Goal: Progress toward achieving an optimal weight will improve Outcome: Progressing   Problem: Skin Integrity: Goal: Risk for impaired skin integrity will decrease Outcome: Progressing   Problem: Tissue Perfusion: Goal: Adequacy of tissue perfusion will improve Outcome: Progressing   Problem: Education: Goal: Understanding of CV disease, CV risk reduction, and recovery process will improve Outcome: Progressing Goal: Individualized Educational Video(s) Outcome: Progressing   Problem: Activity: Goal: Ability to return to baseline activity level will improve Outcome: Progressing   Problem: Cardiovascular: Goal: Ability to achieve and maintain adequate cardiovascular perfusion will improve Outcome: Progressing Goal: Vascular access site(s) Level 0-1 will be maintained Outcome: Progressing   Problem: Health Behavior/Discharge Planning: Goal: Ability to safely manage health-related needs after discharge will improve Outcome: Progressing   Problem: Cardiac: Goal: Ability to maintain an adequate cardiac output will improve Outcome: Progressing   Problem: Health Behavior/Discharge Planning: Goal: Ability to identify and utilize available resources and services will improve Outcome: Progressing Goal: Ability to manage health-related needs will improve Outcome: Progressing   Problem: Fluid Volume: Goal: Ability to achieve a balanced intake and output will  improve Outcome: Progressing   Problem: Metabolic: Goal: Ability to maintain appropriate glucose levels will improve Outcome: Progressing   Problem: Nutritional: Goal: Maintenance of adequate nutrition will improve Outcome: Progressing Goal: Maintenance of adequate weight for body size and type will improve Outcome: Progressing   Problem: Respiratory: Goal: Will regain and/or maintain adequate ventilation Outcome: Progressing   Problem: Urinary Elimination: Goal: Ability to achieve and maintain adequate renal perfusion and functioning will improve Outcome: Progressing   

## 2023-02-17 LAB — MAGNESIUM: Magnesium: 2.7 mg/dL — ABNORMAL HIGH (ref 1.7–2.4)

## 2023-02-17 LAB — COMPREHENSIVE METABOLIC PANEL
ALT: 135 U/L — ABNORMAL HIGH (ref 0–44)
AST: 114 U/L — ABNORMAL HIGH (ref 15–41)
Albumin: 1.9 g/dL — ABNORMAL LOW (ref 3.5–5.0)
Alkaline Phosphatase: 88 U/L (ref 38–126)
Anion gap: 6 (ref 5–15)
BUN: 62 mg/dL — ABNORMAL HIGH (ref 8–23)
CO2: 16 mmol/L — ABNORMAL LOW (ref 22–32)
Calcium: 7.7 mg/dL — ABNORMAL LOW (ref 8.9–10.3)
Chloride: 114 mmol/L — ABNORMAL HIGH (ref 98–111)
Creatinine, Ser: 2.36 mg/dL — ABNORMAL HIGH (ref 0.61–1.24)
GFR, Estimated: 30 mL/min — ABNORMAL LOW (ref >60)
Glucose, Bld: 89 mg/dL (ref 70–99)
Potassium: 3.8 mmol/L (ref 3.5–5.1)
Sodium: 136 mmol/L (ref 135–145)
Total Bilirubin: 29.6 mg/dL (ref 0.3–1.2)
Total Protein: 5.3 g/dL — ABNORMAL LOW (ref 6.5–8.1)

## 2023-02-17 LAB — PHOSPHORUS: Phosphorus: 6.4 mg/dL — ABNORMAL HIGH (ref 2.5–4.6)

## 2023-02-17 LAB — BASIC METABOLIC PANEL
Anion gap: 10 (ref 5–15)
BUN: 69 mg/dL — ABNORMAL HIGH (ref 8–23)
CO2: 16 mmol/L — ABNORMAL LOW (ref 22–32)
Calcium: 8.4 mg/dL — ABNORMAL LOW (ref 8.9–10.3)
Chloride: 112 mmol/L — ABNORMAL HIGH (ref 98–111)
Creatinine, Ser: 2.43 mg/dL — ABNORMAL HIGH (ref 0.61–1.24)
GFR, Estimated: 29 mL/min — ABNORMAL LOW (ref >60)
Glucose, Bld: 108 mg/dL — ABNORMAL HIGH (ref 70–99)
Potassium: 4 mmol/L (ref 3.5–5.1)
Sodium: 138 mmol/L (ref 135–145)

## 2023-02-17 LAB — CBC
HCT: 30.1 % — ABNORMAL LOW (ref 39.0–52.0)
Hemoglobin: 10 g/dL — ABNORMAL LOW (ref 13.0–17.0)
MCH: 28.7 pg (ref 26.0–34.0)
MCHC: 33.2 g/dL (ref 30.0–36.0)
MCV: 86.5 fL (ref 80.0–100.0)
Platelets: 279 10*3/uL (ref 150–400)
RBC: 3.48 MIL/uL — ABNORMAL LOW (ref 4.22–5.81)
RDW: 21.7 % — ABNORMAL HIGH (ref 11.5–15.5)
WBC: 16.2 10*3/uL — ABNORMAL HIGH (ref 4.0–10.5)
nRBC: 0 % (ref 0.0–0.2)

## 2023-02-17 LAB — GLUCOSE, CAPILLARY
Glucose-Capillary: 101 mg/dL — ABNORMAL HIGH (ref 70–99)
Glucose-Capillary: 102 mg/dL — ABNORMAL HIGH (ref 70–99)
Glucose-Capillary: 112 mg/dL — ABNORMAL HIGH (ref 70–99)
Glucose-Capillary: 83 mg/dL (ref 70–99)
Glucose-Capillary: 86 mg/dL (ref 70–99)
Glucose-Capillary: 93 mg/dL (ref 70–99)

## 2023-02-17 LAB — TRIGLYCERIDES: Triglycerides: 139 mg/dL (ref <150)

## 2023-02-17 MED ORDER — LACTATED RINGERS IV BOLUS
1000.0000 mL | Freq: Once | INTRAVENOUS | Status: AC
  Administered 2023-02-17: 1000 mL via INTRAVENOUS

## 2023-02-17 MED ORDER — ALBUMIN HUMAN 5 % IV SOLN
25.0000 g | Freq: Once | INTRAVENOUS | Status: AC
  Administered 2023-02-17: 25 g via INTRAVENOUS
  Filled 2023-02-17: qty 500

## 2023-02-17 MED ORDER — ENOXAPARIN SODIUM 30 MG/0.3ML IJ SOSY
30.0000 mg | PREFILLED_SYRINGE | INTRAMUSCULAR | Status: DC
  Administered 2023-02-19 – 2023-02-20 (×2): 30 mg via SUBCUTANEOUS
  Filled 2023-02-17 (×3): qty 0.3

## 2023-02-17 MED ORDER — CLONAZEPAM 0.1 MG/ML ORAL SUSPENSION
0.5000 mg | Freq: Every day | ORAL | Status: DC
  Administered 2023-02-17: 0.5 mg
  Filled 2023-02-17: qty 5

## 2023-02-17 MED ORDER — QUETIAPINE FUMARATE 25 MG PO TABS
25.0000 mg | ORAL_TABLET | Freq: Every day | ORAL | Status: DC
  Administered 2023-02-17: 25 mg via JEJUNOSTOMY
  Filled 2023-02-17: qty 1

## 2023-02-17 MED ORDER — DAKINS (1/4 STRENGTH) 0.125 % EX SOLN
Freq: Two times a day (BID) | CUTANEOUS | Status: AC
  Filled 2023-02-17 (×2): qty 473

## 2023-02-17 NOTE — Consult Note (Signed)
WOC Nurse ostomy follow up Stoma type/location: LLQ, colostomy  Stomal assessment/size: 1 1/2" slightly oval shaped, barrier cut off center to allow for new JP drain at 1 o'clock. Stoma pink, moist  Peristomal assessment: intact  Treatment options for stomal/peristomal skin: 2" skin  barrier ring; explained to wife this would also protect from drainage between JP site and ostomy  Utilized silicone foam dressing, cut like split gauze to stabilize JP tube and absorb peristomal drainage  Output; liquid brown, milky, foul odor Ostomy pouching: 2pc.2 3/4" with 2" skin barrier ring   Education provided:  Wife independent with ostomy care, however she and I worked together today to off center skin barrier a different way to allow for JP drain.  She is knowledgeable about all steps of the pouch change. Patient is minimally participative due to illness, very jaundiced today  Enrolled patient in [place] Secure Start Discharge program: Yes  (4) 2 3/4" pouching systems and barrier rings in the patient's room along with new pattern.  Patient's wife reports she has Engineer, site already for ordering supplies. She has received Research scientist (physical sciences) at their home.  WOC Nurse will follow along with you for continued support with ostomy teaching and care Betania [facility], RN, [place], CNS, [place] [phone]

## 2023-02-17 NOTE — Progress Notes (Signed)
Patient ID: Bradley Dominguez, male   DOB: October 31, 1955, 67 y.o.   MRN: [id number] Called by RN regarding T tube not working. On my arrival, it is now working but leaking some around it. D tube is draining. His wife is concerned the dakin's solution order stopped (this is a pharmacy protocol). I reordered it.  Violeta Gelinas, MD, MPH, FACS Please use [email or web address] to contact on call provider

## 2023-02-17 NOTE — Progress Notes (Signed)
Inpatient Rehab Admissions Coordinator:   Continuing to follow for potential inpatient rehab admission. Patient is not medically ready, nor can he tolerate intensive therapy just yet.   Rehab Admissons Coordinator Ryan, [place], [place] [phone]

## 2023-02-17 NOTE — Progress Notes (Signed)
Physical Therapy Treatment Patient Details Name: Bradley Dominguez MRN: [id number] DOB: 03/29/1956 Today's Date: 02/17/2023   History of Present Illness 67 yo M HF, chronic partial small bowel obstruction and multiple previous abd surgeries  who was admitted  4/8 from home w n/v/bloating. Underwent EGD 4/11 -- concerning for duodenal obstruction. Developed hemorrhagic shock 4/15 in the setting of hematemesis / hematochezia, decompensated requiring, intubation, embolization of jejunal arcade branch. Extubated 01/22/23. Underwent ex lap adhesiolysis, duodenojejunal bypass, duodenostomy tube placement, T tube placement in common bile duct 4/22, transferred to [facility] 4/23 with hemorrhagic shock, return to OR 4/23, 4/24, 4/25 and finally 4/27 with placement of J tube, end ileostomy, abdominal closure.  Extubated 4/30.    PT Comments    Attempted twice and saw pt after RN medicated for pain.  He was unwilling to sit up or move bed into chair position.  Assisted with LE therex with much encouragement.  Wife reports having lot of fluids out of his drains.  PT will continue to follow. Hopeful for improvement to tolerate intensive inpatient rehab.   Recommendations for follow up therapy are one component of a multi-disciplinary discharge planning process, led by the attending physician.  Recommendations may be updated based on patient status, additional functional criteria and insurance authorization.  Follow Up Recommendations       Assistance Recommended at Discharge Frequent or constant Supervision/Assistance  Patient can return home with the following Two people to help with walking and/or transfers;Assistance with cooking/housework;Direct supervision/assist for medications management;Assist for transportation;Help with stairs or ramp for entrance;Two people to help with bathing/dressing/bathroom   Equipment Recommendations  Other (comment) (TBA)    Recommendations for Other Services       Precautions  / Restrictions Precautions Precautions: Fall     Mobility  Bed Mobility               General bed mobility comments: declined to mobilize even to move bed into chair position    Transfers                        Ambulation/Gait                   Stairs             Wheelchair Mobility    Modified Rankin (Stroke Patients Only)       Balance                                            Cognition Arousal/Alertness: Awake/alert Behavior During Therapy: Flat affect Overall Cognitive Status: Impaired/Different from baseline Area of Impairment: Attention, Safety/judgement, Following commands, Problem solving                   Current Attention Level: Sustained   Following Commands: Follows one step commands with increased time, Follows one step commands inconsistently Safety/Judgement: Decreased awareness of deficits, Decreased awareness of safety   Problem Solving: Slow processing, Requires verbal cues General Comments: wanting to rest, but cooperated with encouragement and limiting mobility        Exercises General Exercises - Lower Extremity Ankle Circles/Pumps: AROM, Both, 5 reps, Supine Short Arc Quad: AROM, 5 reps, Both, Supine Heel Slides: AROM, 5 reps, AAROM, Both, Supine Hip ABduction/ADduction: AROM, AAROM, 5 reps, Both, Supine    General Comments General comments (skin integrity, edema,  etc.): wife in the room initially, left and pt's sister sitting while she went home to shower; VSS though limited to in bed      Pertinent Vitals/Pain Pain Assessment Pain Assessment: Faces Faces Pain Scale: Hurts little more Pain Location: generalized with movement Pain Descriptors / Indicators: Discomfort, Aching, Grimacing, Guarding Pain Intervention(s): Monitored during session, Limited activity within patient's tolerance, Premedicated before session    Home Living                          Prior  Function            PT Goals (current goals can now be found in the care plan section) Progress towards PT goals: Not progressing toward goals - comment    Frequency    Min 3X/week      PT Plan Current plan remains appropriate    Co-evaluation              AM-PAC PT "6 Clicks" Mobility   Outcome Measure  Help needed turning from your back to your side while in a flat bed without using bedrails?: Total Help needed moving from lying on your back to sitting on the side of a flat bed without using bedrails?: Total Help needed moving to and from a bed to a chair (including a wheelchair)?: Total Help needed standing up from a chair using your arms (e.g., wheelchair or bedside chair)?: Total Help needed to walk in hospital room?: Total Help needed climbing 3-5 steps with a railing? : Total 6 Click Score: 6    End of Session   Activity Tolerance: Patient limited by fatigue;Patient limited by pain Patient left: in bed;with call bell/phone within reach;with family/visitor present   PT Visit Diagnosis: Other abnormalities of gait and mobility (R26.89);Muscle weakness (generalized) (M62.81);Other symptoms and signs involving the nervous system (R29.898)     Time: 1205-1219 PT Time Calculation (min) (ACUTE ONLY): 14 min  Charges:  $Therapeutic Exercise: 8-22 mins                     Bradley Dominguez, PT Acute Rehabilitation Services [phone] 02/17/2023    Bradley Dominguez 02/17/2023, 1:36 PM

## 2023-02-17 NOTE — Progress Notes (Signed)
Patient's wife refuses Clinical research associate to stop re-feeding the drain from the T-tube drain to the J-tube. She said the Tube  drain comes right back and she thinks it is hurting the patient patient and causing abdominal distention. MD on call paged (Dr Janee Morn), he asked writer to empty the T-tube and discard for the night.

## 2023-02-17 NOTE — Progress Notes (Signed)
SLP Cancellation Note  Patient Details Name: Bradley Dominguez MRN: [id number] DOB: 05-03-1956   Cancelled treatment:       Reason Eval/Treat Not Completed: Medical issues which prohibited therapy. Pt has not been cleared for oral intake by surgery yet. Will d/c orders and await new orders when pt is medically ready for oral intake beyond ice chips.    Larissa Pegg, Riley Nearing 02/17/2023, 9:33 AM

## 2023-02-17 NOTE — Progress Notes (Signed)
Trauma/Critical Care Follow Up Note  Subjective:    Overnight Issues: Creatinine trending up to 2.3. Having leakage around drain sites. Tbili 29 (stable), transaminases trending down. Wife reports he often is disoriented.  Objective:  Vital signs for last 24 hours: Temp:  [97 F (36.1 C)-98.3 F (36.8 C)] 97 F (36.1 C) (05/13 0800) Pulse Rate:  [84-95] 90 (05/13 0800) Resp:  [18-24] 24 (05/13 0800) BP: (95-109)/(57-64) 100/60 (05/13 0800) SpO2:  [98 %-99 %] 98 % (05/13 0800)   Intake/Output from previous day: 05/12 0701 - 05/13 0700 In: 2436.2 [I.V.:1136.2; IV Piggyback:95] Out: 2950 [Urine:600; Drains:1550; Stool:800]  Intake/Output this shift: Total I/O In: -  Out: 570 [Drains:570]  Physical Exam:  Gen: comfortable, no distress Neuro: alert, follows commands, minimally communicative HEENT: scleral icterus Neck: supple CV: RRR Pulm: unlabored breathing on RA Abd: soft, NT, midline wound with fibrinous eschar , ostomy productive of thin stool , right upper quadrant T-tube with bilious output, JP drain with murky thin brown fluid, duodenostomy tube output similar to JP drain output. GU: urine dark and yellow, +Foley Extr: wwp, no edema  Results for orders placed or performed during the hospital encounter of 01/13/23 (from the past 24 hour(s))  Glucose, capillary     Status: None   Collection Time: 02/16/23  5:27 PM  Result Value Ref Range   Glucose-Capillary 99 70 - 99 mg/dL  Glucose, capillary     Status: Abnormal   Collection Time: 02/16/23  7:37 PM  Result Value Ref Range   Glucose-Capillary 100 (H) 70 - 99 mg/dL  Glucose, capillary     Status: Abnormal   Collection Time: 02/17/23 12:07 AM  Result Value Ref Range   Glucose-Capillary 112 (H) 70 - 99 mg/dL  Glucose, capillary     Status: None   Collection Time: 02/17/23  3:33 AM  Result Value Ref Range   Glucose-Capillary 86 70 - 99 mg/dL  Comprehensive metabolic panel     Status: Abnormal   Collection  Time: 02/17/23  4:30 AM  Result Value Ref Range   Sodium 136 135 - 145 mmol/L   Potassium 3.8 3.5 - 5.1 mmol/L   Chloride 114 (H) 98 - 111 mmol/L   CO2 16 (L) 22 - 32 mmol/L   Glucose, Bld 89 70 - 99 mg/dL   BUN 62 (H) 8 - 23 mg/dL   Creatinine, Ser 1.61 (H) 0.61 - 1.24 mg/dL   Calcium 7.7 (L) 8.9 - 10.3 mg/dL   Total Protein 5.3 (L) 6.5 - 8.1 g/dL   Albumin 1.9 (L) 3.5 - 5.0 g/dL   AST 096 (H) 15 - 41 U/L   ALT 135 (H) 0 - 44 U/L   Alkaline Phosphatase 88 38 - 126 U/L   Total Bilirubin 29.6 (HH) 0.3 - 1.2 mg/dL   GFR, Estimated 30 (L) >60 mL/min   Anion gap 6 5 - 15  Magnesium     Status: Abnormal   Collection Time: 02/17/23  4:30 AM  Result Value Ref Range   Magnesium 2.7 (H) 1.7 - 2.4 mg/dL  Phosphorus     Status: Abnormal   Collection Time: 02/17/23  4:30 AM  Result Value Ref Range   Phosphorus 6.4 (H) 2.5 - 4.6 mg/dL  Triglycerides     Status: None   Collection Time: 02/17/23  4:30 AM  Result Value Ref Range   Triglycerides 139 <150 mg/dL  CBC     Status: Abnormal   Collection Time: 02/17/23  4:30  AM  Result Value Ref Range   WBC 16.2 (H) 4.0 - 10.5 K/uL   RBC 3.48 (L) 4.22 - 5.81 MIL/uL   Hemoglobin 10.0 (L) 13.0 - 17.0 g/dL   HCT 16.1 (L) 09.6 - 04.5 %   MCV 86.5 80.0 - 100.0 fL   MCH 28.7 26.0 - 34.0 pg   MCHC 33.2 30.0 - 36.0 g/dL   RDW 40.9 (H) 81.1 - 91.4 %   Platelets 279 150 - 400 K/uL   nRBC 0.0 0.0 - 0.2 %  Glucose, capillary     Status: None   Collection Time: 02/17/23  8:17 AM  Result Value Ref Range   Glucose-Capillary 93 70 - 99 mg/dL    Assessment & Plan: The plan of care was discussed with the bedside nurse for the day, who is in agreement with this plan and no additional concerns were raised.   Present on Admission:  Bowel obstruction (HCC)  Protein-calorie malnutrition, severe (HCC)  Duodenal obstruction    LOS: 35 days   Additional comments:I reviewed the patient's new clinical lab test results.   and I reviewed the patients new  imaging test results.    67 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 4/22 - Ex lap, adhesiolysis, duodenojejunal bypass, duodenostomy tube placement, T tube placement in common bile duct. 4/23 - Takeback for bleeding with hemorrhagic shock, resection of ischemic transverse colon, left in discontinuity with Abthera 4/24 - Takeback for bleeding, abdominal washout, ileocecectomy, pack placement, placement of Abthera 4/25 - Takeback, resection of 10cm necrotic distal ileum, washout, removal of packs, placement of Abthera 4/27 - Takeback, placement of J tube, end ileostomy, abdominal closure   - Hyperbilirubinemia: Tube cholangiogram on 5/3 showed patency of the biliary tree with no obstruction. Massive transfusion very likely contributed, liver duplex with no vascular etiology. Tbili is stable at 29 from 4 days ago, hopefully will start to downtrend. Continue to monitor. - Keep T tube and D tube to gravity drainage. JP to bulb suction. - Pain: oxycodone, prn dilaudid, scheduled IV robaxin - Agitation/delirium: Improving. Wean Seroquel to 25mg  qhs. Wean Klonipin to 0.5qhs. - ID: Pseudomonas pneumonia, resistant to Zosyn and cefepime. 7-day course of ciprofloxacin completed. Rising WBC with increased leakage around drain. Plan for repeat CT this week to ensure no undrained fluid collections, prefer to wait until AKI improves. - Wound care: Damp to dry dressings to midline incision. Need to change drain site dressings frequently to keep skin dry and prevent skin breakdown. - FEN: NPO, ok for ice chips, TF at goal, continue maintenance IVF of NS at 100 ml/hr, free water flushes via J tube.  Re-feeding bile from T tube via J tube. - AKI: Likely due to hypovolemia secondary to high drain output. Give 1L LR bolus this morning and 500 mL 5% albumin. Repeat labs this afternoon. - WOC following for ostomy care and teaching - PT/OT following. Anticipate CIR at discharge. Patient is very deconditioned. - VTE: SCDs,  lovenox - Dispo: 4NP   Fritzi Mandes  MD Trauma & General Surgery Please use [email or web address] to contact on call provider  02/17/2023  *Care during the described time interval was provided by me. I have reviewed this patient's available data, including medical history, events of note, physical examination and test results as part of my evaluation.

## 2023-02-18 ENCOUNTER — Inpatient Hospital Stay (HOSPITAL_COMMUNITY): Payer: BC Managed Care – PPO

## 2023-02-18 LAB — CBC
HCT: 28 % — ABNORMAL LOW (ref 39.0–52.0)
Hemoglobin: 9.4 g/dL — ABNORMAL LOW (ref 13.0–17.0)
MCH: 29.2 pg (ref 26.0–34.0)
MCHC: 33.6 g/dL (ref 30.0–36.0)
MCV: 87 fL (ref 80.0–100.0)
Platelets: 222 10*3/uL (ref 150–400)
RBC: 3.22 MIL/uL — ABNORMAL LOW (ref 4.22–5.81)
RDW: 21.5 % — ABNORMAL HIGH (ref 11.5–15.5)
WBC: 22.3 10*3/uL — ABNORMAL HIGH (ref 4.0–10.5)
nRBC: 0 % (ref 0.0–0.2)

## 2023-02-18 LAB — BASIC METABOLIC PANEL
Anion gap: 13 (ref 5–15)
BUN: 71 mg/dL — ABNORMAL HIGH (ref 8–23)
CO2: 13 mmol/L — ABNORMAL LOW (ref 22–32)
Calcium: 7.9 mg/dL — ABNORMAL LOW (ref 8.9–10.3)
Chloride: 114 mmol/L — ABNORMAL HIGH (ref 98–111)
Creatinine, Ser: 2.26 mg/dL — ABNORMAL HIGH (ref 0.61–1.24)
GFR, Estimated: 31 mL/min — ABNORMAL LOW (ref >60)
Glucose, Bld: 107 mg/dL — ABNORMAL HIGH (ref 70–99)
Potassium: 3.8 mmol/L (ref 3.5–5.1)
Sodium: 140 mmol/L (ref 135–145)

## 2023-02-18 LAB — GLUCOSE, CAPILLARY
Glucose-Capillary: 101 mg/dL — ABNORMAL HIGH (ref 70–99)
Glucose-Capillary: 104 mg/dL — ABNORMAL HIGH (ref 70–99)
Glucose-Capillary: 107 mg/dL — ABNORMAL HIGH (ref 70–99)
Glucose-Capillary: 111 mg/dL — ABNORMAL HIGH (ref 70–99)
Glucose-Capillary: 91 mg/dL (ref 70–99)
Glucose-Capillary: 98 mg/dL (ref 70–99)

## 2023-02-18 LAB — MAGNESIUM: Magnesium: 2.6 mg/dL — ABNORMAL HIGH (ref 1.7–2.4)

## 2023-02-18 LAB — PHOSPHORUS: Phosphorus: 5.7 mg/dL — ABNORMAL HIGH (ref 2.5–4.6)

## 2023-02-18 MED ORDER — LACTATED RINGERS IV BOLUS
1000.0000 mL | Freq: Once | INTRAVENOUS | Status: AC
  Administered 2023-02-18: 1000 mL via INTRAVENOUS

## 2023-02-18 MED ORDER — ALBUMIN HUMAN 5 % IV SOLN
25.0000 g | Freq: Once | INTRAVENOUS | Status: AC
  Administered 2023-02-18: 25 g via INTRAVENOUS
  Filled 2023-02-18 (×2): qty 500

## 2023-02-18 MED ORDER — FREE WATER
30.0000 mL | Freq: Three times a day (TID) | Status: DC
  Administered 2023-02-18: 30 mL

## 2023-02-18 MED ORDER — DEXTROSE-NACL 5-0.45 % IV SOLN: INTRAVENOUS | Status: AC

## 2023-02-18 MED ORDER — METHOCARBAMOL 1000 MG/10ML IJ SOLN
500.0000 mg | Freq: Three times a day (TID) | INTRAVENOUS | Status: DC
  Administered 2023-02-18 – 2023-02-24 (×18): 500 mg via INTRAVENOUS
  Filled 2023-02-18 (×11): qty 500
  Filled 2023-02-18 (×3): qty 5
  Filled 2023-02-18 (×4): qty 500
  Filled 2023-02-18: qty 5
  Filled 2023-02-18 (×2): qty 500

## 2023-02-18 MED ORDER — HYDROMORPHONE HCL 1 MG/ML IJ SOLN
0.5000 mg | INTRAMUSCULAR | Status: DC | PRN
  Administered 2023-02-18 – 2023-02-27 (×38): 0.5 mg via INTRAVENOUS
  Filled 2023-02-18 (×38): qty 1

## 2023-02-18 NOTE — Progress Notes (Signed)
General Surgery Follow Up Note  Subjective:    Overnight Issues:   Objective:  Vital signs for last 24 hours: Temp:  [97 F (36.1 C)-98.1 F (36.7 C)] 97.4 F (36.3 C) (05/14 0300) Pulse Rate:  [90-95] 95 (05/14 0300) Resp:  [17-28] 18 (05/14 0300) BP: (92-102)/(56-65) 102/58 (05/14 0300) SpO2:  [98 %-99 %] 98 % (05/14 0300)  Hemodynamic parameters for last 24 hours:    Intake/Output from previous day: 05/13 0701 - 05/14 0700 In: 2870 [NG/GT:2870] Out: 4860 [Urine:1550; Drains:3160; Stool:150]  Intake/Output this shift: No intake/output data recorded.  Vent settings for last 24 hours:    Physical Exam:  Gen: comfortable, no distress Neuro: follows commands, alert, communicative HEENT: PERRL Neck: supple CV: RRR Pulm: mildly labored breathing on RA Abd: soft, moderately TTP, distended, , midline wound with granulation tissueand thin green drainage and fibrinous exudate , T/D tubes to gravity (T-tube with sediment today, no output from D-tube in 24h), J tube feeds GU: urine  dark yellow, , +Foley Extr: wwp, no edema  Results for orders placed or performed during the hospital encounter of 01/13/23 (from the past 24 hour(s))  Glucose, capillary     Status: None   Collection Time: 02/17/23  8:17 AM  Result Value Ref Range   Glucose-Capillary 93 70 - 99 mg/dL  Glucose, capillary     Status: Abnormal   Collection Time: 02/17/23 11:17 AM  Result Value Ref Range   Glucose-Capillary 101 (H) 70 - 99 mg/dL  Basic metabolic panel     Status: Abnormal   Collection Time: 02/17/23  2:35 PM  Result Value Ref Range   Sodium 138 135 - 145 mmol/L   Potassium 4.0 3.5 - 5.1 mmol/L   Chloride 112 (H) 98 - 111 mmol/L   CO2 16 (L) 22 - 32 mmol/L   Glucose, Bld 108 (H) 70 - 99 mg/dL   BUN 69 (H) 8 - 23 mg/dL   Creatinine, Ser 1.61 (H) 0.61 - 1.24 mg/dL   Calcium 8.4 (L) 8.9 - 10.3 mg/dL   GFR, Estimated 29 (L) >60 mL/min   Anion gap 10 5 - 15  Glucose, capillary     Status:  None   Collection Time: 02/17/23  3:43 PM  Result Value Ref Range   Glucose-Capillary 83 70 - 99 mg/dL  Glucose, capillary     Status: Abnormal   Collection Time: 02/17/23  7:48 PM  Result Value Ref Range   Glucose-Capillary 102 (H) 70 - 99 mg/dL  Glucose, capillary     Status: Abnormal   Collection Time: 02/18/23 12:03 AM  Result Value Ref Range   Glucose-Capillary 104 (H) 70 - 99 mg/dL  Glucose, capillary     Status: Abnormal   Collection Time: 02/18/23  3:14 AM  Result Value Ref Range   Glucose-Capillary 107 (H) 70 - 99 mg/dL  CBC     Status: Abnormal   Collection Time: 02/18/23  4:15 AM  Result Value Ref Range   WBC 22.3 (H) 4.0 - 10.5 K/uL   RBC 3.22 (L) 4.22 - 5.81 MIL/uL   Hemoglobin 9.4 (L) 13.0 - 17.0 g/dL   HCT 09.6 (L) 04.5 - 40.9 %   MCV 87.0 80.0 - 100.0 fL   MCH 29.2 26.0 - 34.0 pg   MCHC 33.6 30.0 - 36.0 g/dL   RDW 81.1 (H) 91.4 - 78.2 %   Platelets 222 150 - 400 K/uL   nRBC 0.0 0.0 - 0.2 %  Assessment & Plan: The plan of care was discussed with the bedside nurse for the day, who is in agreement with this plan and no additional concerns were raised.   Present on Admission:  Bowel obstruction (HCC)  Protein-calorie malnutrition, severe (HCC)  Duodenal obstruction    LOS: 36 days   Additional comments:I reviewed the patient's new clinical lab test results.   and I reviewed the patients new imaging test results.    67 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  4/22 - Ex lap, adhesiolysis, duodenojejunal bypass, duodenostomy tube placement, T tube placement in common bile duct. 4/23 - Takeback for bleeding with hemorrhagic shock, resection of ischemic transverse colon, left in discontinuity with Abthera 4/24 - Takeback for bleeding, abdominal washout, ileocecectomy, pack placement, placement of Abthera 4/25 - Takeback, resection of 10cm necrotic distal ileum, washout, removal of packs, placement of Abthera 4/27 - Takeback, placement of J tube, end ileostomy,  abdominal closure   - Hyperbilirubinemia: Tube cholangiogram on 5/3 showed patency of the biliary tree with no obstruction. Massive transfusion very likely contributed. - Keep T tube and D tube to gravity drainage. JP to bulb suction. D-tube flushed this AM, was likely clogged, causing backup into T-tube drain. Plan CT A/P today.  - Pain: oxycodone, prn dilaudid - Agitation/delirium: Improved. wean Seroquel qhs, Klonopin BID, prn haldol. - ID: Pseudomonas pneumonia, resistant to Zosyn and cefepime. S/p Ciprofloxacin x7d. - Wound care: dakins to midline wound for 3 days - FEN: NPO, ok for ice chips, TF at goal, cont MIVF, FWF, refeed bile from T-tube into J-tube, mix with FWF - WOC following for ostomy care and teaching - PT/OT following. Anticipate CIR at discharge. - VTE: SCDs, lovenox - Dispo: upgrade to ICU, 25g albumin bolus, CT A/P today to eval for undrained intra-abdominal fluid collection. Patient seen and evaluated in combination with Dr. Freida Busman.    Diamantina Monks, MD Trauma & General Surgery Please use [email or web address] to contact on call provider  02/18/2023  *Care during the described time interval was provided by me. I have reviewed this patient's available data, including medical history, events of note, physical examination and test results as part of my evaluation.

## 2023-02-18 NOTE — Progress Notes (Signed)
OT Cancellation Note  Patient Details Name: Bradley Dominguez MRN: [id number] DOB: [birth year]   Cancelled Treatment:    Reason Eval/Treat Not Completed: Medical issues which prohibited therapy.  Pt now with NG tube, low BP in bed 83/47 and likely transferring back to ICU for closer medical management.  Will continue to follow for therapy once stable.   Perrin Maltese, OTR/L Acute Rehabilitation Services  Office [phone] 02/18/2023

## 2023-02-18 NOTE — Progress Notes (Signed)
Patient re-examined at bedside this afternoon. CT reviewed and shows diffuse small bowel dilation and gastric distension. No large un-drained fluid collections, and duodenostomy is appropriately positioned within the lumen of the duodenum. T tube remains in the porta, but impossible to tell if it is within the bile duct without contrast. It is currently draining but has sediment. NG tube was placed with return of 2L. Suspect ileus vs underlying dysmotility. Hold tube feeds for now. Will also need to hold TPN given jaundice. Suspect bilirubin has peaked, once reliably downtrending will resume TPN as patient is severely malnourished.   I updated patient's wife and other family at bedside this afternoon regarding CT findings. Giving additional fluid to replace GI losses. BMP shows bicarb is low at 13, however creatinine has peaked.

## 2023-02-19 DIAGNOSIS — R17 Unspecified jaundice: Secondary | ICD-10-CM | POA: Diagnosis not present

## 2023-02-19 DIAGNOSIS — E43 Unspecified severe protein-calorie malnutrition: Secondary | ICD-10-CM | POA: Diagnosis not present

## 2023-02-19 LAB — BASIC METABOLIC PANEL
Anion gap: 11 (ref 5–15)
BUN: 78 mg/dL — ABNORMAL HIGH (ref 8–23)
CO2: 16 mmol/L — ABNORMAL LOW (ref 22–32)
Calcium: 8.1 mg/dL — ABNORMAL LOW (ref 8.9–10.3)
Chloride: 114 mmol/L — ABNORMAL HIGH (ref 98–111)
Creatinine, Ser: 2.38 mg/dL — ABNORMAL HIGH (ref 0.61–1.24)
GFR, Estimated: 29 mL/min — ABNORMAL LOW (ref >60)
Glucose, Bld: 104 mg/dL — ABNORMAL HIGH (ref 70–99)
Potassium: 3.4 mmol/L — ABNORMAL LOW (ref 3.5–5.1)
Sodium: 141 mmol/L (ref 135–145)

## 2023-02-19 LAB — GLUCOSE, CAPILLARY
Glucose-Capillary: 100 mg/dL — ABNORMAL HIGH (ref 70–99)
Glucose-Capillary: 101 mg/dL — ABNORMAL HIGH (ref 70–99)
Glucose-Capillary: 85 mg/dL (ref 70–99)
Glucose-Capillary: 87 mg/dL (ref 70–99)
Glucose-Capillary: 92 mg/dL (ref 70–99)
Glucose-Capillary: 93 mg/dL (ref 70–99)

## 2023-02-19 LAB — CBC
HCT: 27.1 % — ABNORMAL LOW (ref 39.0–52.0)
Hemoglobin: 9.2 g/dL — ABNORMAL LOW (ref 13.0–17.0)
MCH: 29.2 pg (ref 26.0–34.0)
MCHC: 33.9 g/dL (ref 30.0–36.0)
MCV: 86 fL (ref 80.0–100.0)
Platelets: 181 10*3/uL (ref 150–400)
RBC: 3.15 MIL/uL — ABNORMAL LOW (ref 4.22–5.81)
RDW: 22.3 % — ABNORMAL HIGH (ref 11.5–15.5)
WBC: 20.1 10*3/uL — ABNORMAL HIGH (ref 4.0–10.5)
nRBC: 0 % (ref 0.0–0.2)

## 2023-02-19 LAB — PROTIME-INR
INR: 1.6 — ABNORMAL HIGH (ref 0.8–1.2)
Prothrombin Time: 19 seconds — ABNORMAL HIGH (ref 11.4–15.2)

## 2023-02-19 LAB — HEPATIC FUNCTION PANEL
ALT: 87 U/L — ABNORMAL HIGH (ref 0–44)
AST: 80 U/L — ABNORMAL HIGH (ref 15–41)
Albumin: 2.4 g/dL — ABNORMAL LOW (ref 3.5–5.0)
Alkaline Phosphatase: 65 U/L (ref 38–126)
Bilirubin, Direct: 16 mg/dL — ABNORMAL HIGH (ref 0.0–0.2)
Indirect Bilirubin: 10.5 mg/dL — ABNORMAL HIGH (ref 0.3–0.9)
Total Bilirubin: 26.5 mg/dL (ref 0.3–1.2)
Total Protein: 5 g/dL — ABNORMAL LOW (ref 6.5–8.1)

## 2023-02-19 LAB — BILIRUBIN, TOTAL: Total Bilirubin: 31.3 mg/dL (ref 0.3–1.2)

## 2023-02-19 LAB — APTT: aPTT: 41 seconds — ABNORMAL HIGH (ref 24–36)

## 2023-02-19 MED ORDER — ALBUMIN HUMAN 5 % IV SOLN
25.0000 g | Freq: Once | INTRAVENOUS | Status: AC
  Administered 2023-02-19: 25 g via INTRAVENOUS
  Filled 2023-02-19: qty 500

## 2023-02-19 NOTE — Progress Notes (Signed)
Nutrition Follow-up  DOCUMENTATION CODES:   Severe malnutrition in context of chronic illness  INTERVENTION:   - Given severity of malnutrition and inability to provide enteral nutrition due to ileus vs underlying dysmotility, recommend resuming TPN. Discussed with Surgery. Concern regarding pt's jaundice and hyperbilirubinemia. Holding off on resuming TPN at this time per Surgery.  NUTRITION DIAGNOSIS:   Severe Malnutrition related to chronic illness as evidenced by moderate fat depletion, severe muscle depletion, energy intake < or equal to 75% for > or equal to 1 month, percent weight loss.  Ongoing  GOAL:   Patient will meet greater than or equal to 90% of their needs  Unmet  MONITOR:   Labs, Weight trends, Skin, I & O's  REASON FOR ASSESSMENT:   Consult Enteral/tube feeding initiation and management (advance tube feeds to goal)  ASSESSMENT:   67 yo male who was admitted from home with worsening nausea, vomiting, bloating and weakness. Patient with remote history of multiple prior laparotomies (in the 1970's), with a chronic partial small bowel obstruction.  04/08 - admitted, PICC line placed 04/09 - TPN initiated 04/13 - TPN increased to goal 04/14 - emergent EGD showing oozing gastric ulcer s/p epi and clips 04/15 - IR for emergent embolization, intubated 04/22 - s/p ex lap, LOA, duodenojejunal bypass with biliary T-tube, duodenostomy tube 04/23 - s/p second OR for ex lap with transverse colectomy, abd packed and open abd, left in discontinuity due to colonic ischemia, ligation of bleeding mesenteric vessels 04/24 - OR due to acute decompensation s/p ex lap with ileocectomy, temp abd closure, MTP (did not receive TPN due to contaminated port) 04/25 - s/p ex lap, washout, SBR (10 cm necrotic distal ileum), VAC  04/27 - s/p ileostomy, J-tube placement, abd closure  04/30 - extubated  05/05 - TPN cycled  05/06 - TPN stopped due to elevated bilirubin 05/07 - trickle  TF started via J-tube 05/09 - TF advancing to goal rate 05/14 - CT showing diffuse small bowel dilation and gastric distention, TF d/c, NG tube placed to [facility]  Surgery suspects pt with ileus vs underlying dysmotility. Tube feeds are being held for now. Given severity of malnutrition, recommend resuming TPN. Discussed with Surgery; will hold off on resuming TPN given jaundice hyperbilirubinemia. Plan to resume TPN once bilirubin is reliably downtrending. Bilirubin up slightly today to 31.3 compared to 29.6 on 5/13. Per Surgery, considering GI consult.  Discussed pt with RN at bedside.  Admission weight: 68.1 kg  Current weight: 72.4 kg  Medications reviewed and include: IV protonix IVF: D5-1/2NS @ 100 ml/hr  Labs reviewed: potassium 3.4, BUN 78, creatinine 2.38, phosphorus 5.7 on 5/14, magnesium 2.6 on 5/14, elevated LFTs on 5/13, total bilirubin 31.3 (up from 29.6 on 5/13), WBC 20.1, hemoglobin 9.2  UOP: 1725 ml x 24 hours NG tube: 4100 ml x 24 hours Biliary T tube: 30 ml x 24 hours L abd JP: 15 ml x 24 hours 24 F RUQ: 50 ml x 24 hours Ileostomy: 125 ml x 24 hours 16 F J-tube: clamped I/O's: -1.1 L since admit    Diet Order:   Diet Order             Diet NPO time specified Except for: Ice Chips  Diet effective now                   EDUCATION NEEDS:   Education needs have been addressed  Skin:  Skin Assessment: Skin Integrity Issues: Incisions: abd  Last  BM:  02/18/23 125 ml via ileostomy  Height:   Ht Readings from Last 1 Encounters:  01/27/23 6\' 1"  (1.854 m)    Weight:   Wt Readings from Last 1 Encounters:  02/19/23 72.4 kg    Ideal Body Weight:     BMI:  Body mass index is 21.06 kg/m.  Estimated Nutritional Needs:   Kcal:  2300-2600  Protein:  130-145 grams  Fluid:  2.1L/day    Mertie Clause, MS, RD, LDN Inpatient Clinical Dietitian Please see AMiON for contact information.

## 2023-02-19 NOTE — TOC Progression Note (Signed)
Transition of Care [facility]) - Progression Note    Patient Details  Name: Bradley Dominguez MRN: [id number] Date of Birth: 06-Jun-1956  Transition of Care [facility]) CM/SW Contact  Mearl Latin, LCSW Phone Number: 02/19/2023, 9:34 AM  Clinical Narrative:    TOC following for potential needs once patient is medically stable.      Barriers to Discharge: Continued Medical Work up  Expected Discharge Plan and Services                                               Social Determinants of Health (SDOH) Interventions SDOH Screenings   Food Insecurity: No Food Insecurity (01/13/2023)  Housing: Low Risk  (01/13/2023)  Transportation Needs: No Transportation Needs (01/13/2023)  Utilities: Not At Risk (01/13/2023)  Tobacco Use: Low Risk  (02/02/2023)    Readmission Risk Interventions     No data to display

## 2023-02-19 NOTE — Progress Notes (Signed)
Physical Therapy Treatment Patient Details Name: Bradley Dominguez MRN: [id number] DOB: 21-Dec-1955 Today's Date: 02/19/2023   History of Present Illness 67 yo M HF, chronic partial small bowel obstruction and multiple previous abd surgeries  who was admitted  4/8 from home w n/v/bloating. Underwent EGD 4/11 -- concerning for duodenal obstruction. Developed hemorrhagic shock 4/15 in the setting of hematemesis / hematochezia, decompensated requiring, intubation, embolization of jejunal arcade branch. Extubated 01/22/23. Underwent ex lap adhesiolysis, duodenojejunal bypass, duodenostomy tube placement, T tube placement in common bile duct 4/22, transferred to [facility] 4/23 with hemorrhagic shock, return to OR 4/23, 4/24, 4/25 and finally 4/27 with placement of J tube, end ileostomy, abdominal closure.  Extubated 4/30.  NGT placed 5/14 due to possible ileus.    PT Comments    Patient now in ICU and wife reports slept much better overnight with less discomfort, but remains weak and with limited activity tolerance.  Encouraged and participated in bed level exercises only, but with improved tolerance and participation than last session.  Patient will continue to benefit from skilled PT in the acute setting.  Hopeful for progression to tolerate intensive inpatient rehab to allow d/c home with wife assist.    Recommendations for follow up therapy are one component of a multi-disciplinary discharge planning process, led by the attending physician.  Recommendations may be updated based on patient status, additional functional criteria and insurance authorization.  Follow Up Recommendations       Assistance Recommended at Discharge Frequent or constant Supervision/Assistance  Patient can return home with the following Two people to help with walking and/or transfers;Assistance with cooking/housework;Direct supervision/assist for medications management;Assist for transportation;Help with stairs or ramp for entrance;Two  people to help with bathing/dressing/bathroom   Equipment Recommendations  Other (comment) (TBA)    Recommendations for Other Services Rehab consult     Precautions / Restrictions Precautions Precautions: Fall Precaution Comments: multiple lines, JP drain, biliary tube, ileostomy; abdominal incision; NGT to suction     Mobility  Bed Mobility Overal bed mobility: Needs Assistance             General bed mobility comments: scooting up to [facility] +2 A for positioning, wife assisting    Transfers                        Ambulation/Gait                   Stairs             Wheelchair Mobility    Modified Rankin (Stroke Patients Only)       Balance                                            Cognition Arousal/Alertness: Awake/alert Behavior During Therapy: Flat affect Overall Cognitive Status: Impaired/Different from baseline Area of Impairment: Attention, Safety/judgement, Following commands, Problem solving                   Current Attention Level: Sustained   Following Commands: Follows one step commands with increased time, Follows one step commands inconsistently Safety/Judgement: Decreased awareness of safety, Decreased awareness of deficits   Problem Solving: Slow processing, Decreased initiation, Difficulty sequencing General Comments: wife present to engage/encourage        Exercises General Exercises - Lower Extremity Ankle Circles/Pumps: AROM, 10 reps, Both, Supine  Short Arc Quad: AROM, 10 reps, Both, Supine Heel Slides: AROM, Both, Supine, 10 reps Hip ABduction/ADduction: AROM, AAROM, Both, Supine, 10 reps    General Comments        Pertinent Vitals/Pain Pain Assessment Faces Pain Scale: Hurts even more Pain Location: grimacing at the thought of bed mobility Pain Descriptors / Indicators: Discomfort, Aching, Grimacing, Guarding Pain Intervention(s): Monitored during session, Limited  activity within patient's tolerance, Repositioned    Home Living                          Prior Function            PT Goals (current goals can now be found in the care plan section) Acute Rehab PT Goals Patient Stated Goal: get stronger and go home PT Goal Formulation: With patient/family Time For Goal Achievement: 03/05/23 Potential to Achieve Goals: Fair Progress towards PT goals: Not progressing toward goals - comment (goals renewed)    Frequency    Min 3X/week      PT Plan Current plan remains appropriate    Co-evaluation              AM-PAC PT "6 Clicks" Mobility   Outcome Measure  Help needed turning from your back to your side while in a flat bed without using bedrails?: Total Help needed moving from lying on your back to sitting on the side of a flat bed without using bedrails?: Total Help needed moving to and from a bed to a chair (including a wheelchair)?: Total Help needed standing up from a chair using your arms (e.g., wheelchair or bedside chair)?: Total Help needed to walk in hospital room?: Total Help needed climbing 3-5 steps with a railing? : Total 6 Click Score: 6    End of Session   Activity Tolerance: Patient limited by fatigue;Patient limited by pain Patient left: in bed;with call bell/phone within reach;with family/visitor present   PT Visit Diagnosis: Other abnormalities of gait and mobility (R26.89);Muscle weakness (generalized) (M62.81);Other symptoms and signs involving the nervous system (R29.898)     Time: 1115-1130 PT Time Calculation (min) (ACUTE ONLY): 15 min  Charges:  $Therapeutic Exercise: 8-22 mins                     Sheran Lawless, PT Acute Rehabilitation Services Office:[phone] 02/19/2023    [name] 02/19/2023, 3:43 PM

## 2023-02-19 NOTE — TOC Progression Note (Signed)
Transition of Care (TOC) - Progression Note    Patient Details  Name: Bradley Dominguez MRN: [id number] Date of Birth: 03/11/1956  Transition of Care (TOC) CM/SW Contact  Vishwa Dais S Jonna Dittrich, LCSW Phone Number: 02/19/2023, 9:35 AM  Clinical Narrative:    TOC following for potential needs once patient is medically stable.      Barriers to Discharge: Continued Medical Work up  Expected Discharge Plan and Services                                               Social Determinants of Health (SDOH) Interventions SDOH Screenings   Food Insecurity: No Food Insecurity (01/13/2023)  Housing: Low Risk  (01/13/2023)  Transportation Needs: No Transportation Needs (01/13/2023)  Utilities: Not At Risk (01/13/2023)  Tobacco Use: Low Risk  (02/02/2023)    Readmission Risk Interventions     No data to display          

## 2023-02-19 NOTE — Consult Note (Addendum)
Referring Provider: Fritzi Mandes, MD Primary Care Physician:  Gaspar Garbe, MD Primary Gastroenterologist:  Dr. Marina Goodell   Reason for Consultation:  Elevated total bili  HPI: Bradley Dominguez is a 67 y.o. male with minimal chronic medical problems.  Had intestinal surgery as teenager. 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 Following that patient had small bowel obstructions requiring additional resection. No problems and the last 40 years or more. 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  Had a massive bleed and massive transfusion protocol.  During this hospitalization LFTs have trended up.  Initially thought to be due to shock liver and maybe hemolysis after massive transfusions.  All of that occurred a few weeks ago now.  Other LFTs are trending down, but total bili remains elevated at 31.3.  He had a cholangio through the T-tube that was patent on 5/3.  Liver Doppler/duplex showed diffuse increased echogenicity of the hepatic parenchyma indicative of hepatocellular dysfunction, most commonly steatosis, but flow all looks good.  Noncontrast CT scan looks okay.  Does not appear to be an obstructive issue.  No known history of underlying liver disease.  He has not been able to tolerate enteral feeds and TNA is on hold due to elevated bili.  GI being consulted for input regarding persistently elevated total bili.  Patient had just received pain medication prior to my visit and there was no family at bedside.  EGD this admission on 01/19/2023:  -Oozing gastric ulcer with oozing hemorrhage (Forrest Class Ib). Injected/cips x 2/pureStat application.  Last colonoscopy August 2023 per Dr. Marina Goodell with  ultraslim scope revealed internal hemorrhoids otherwise normal, no polyps.    Past Medical History:  Diagnosis Date   Arthritis    HANDS   Bowel obstruction (HCC)    CHF (congestive heart failure) (HCC) 11/26/2018   Elevated brain natriuretic peptide (BNP) level 11/25/2018   Family history of adverse reaction to anesthesia    FATHER SLOW TO AWAKEN, MOTHER POST OP N/V   Frequent UTI    History of colon polyps 2009   History of shingles    Plantar fasciitis    Prostate hypertrophy    Vitamin B 12 deficiency     Past Surgical History:  Procedure Laterality Date   APPENDECTOMY     APPLICATION OF WOUND VAC  01/29/2023   Procedure: APPLICATION OF WOUND VAC;  Surgeon: Fritzi Mandes, MD;  Location: [facility] OR;  Service: General;;   BIOPSY  01/16/2023   Procedure: BIOPSY;  Surgeon: Lynann Bologna, MD;  Location: [facility];  Service: Gastroenterology;;   BOWEL RESECTION N/A 01/30/2023   Procedure: SMALL BOWEL RESECTION;  Surgeon: Fritzi Mandes, MD;  Location: [facility] OR;  Service: General;  Laterality: N/A;   CECOSTOMY N/A 01/29/2023   Procedure: Meliton Rattan;  Surgeon: Fritzi Mandes, MD;  Location: [facility] OR;  Service: General;  Laterality: N/A;   CHOLECYSTECTOMY     COLONSCOPY  AGE 71   WITH POLYP REMOVED   ESOPHAGOGASTRODUODENOSCOPY N/A 01/19/2023   Procedure: ESOPHAGOGASTRODUODENOSCOPY (EGD);  Surgeon: Lynann Bologna, MD;  Location: [facility];  Service: Gastroenterology;  Laterality: N/A;   ESOPHAGOGASTRODUODENOSCOPY (EGD) WITH PROPOFOL N/A 01/16/2023   Procedure: ESOPHAGOGASTRODUODENOSCOPY (EGD) WITH PROPOFOL;  Surgeon: Lynann Bologna, MD;  Location: [facility];  Service: Gastroenterology;  Laterality: N/A;   GASTROSTOMY  02/01/2023   Procedure: INSERTION OF JEJUNOSTOMY TUBE;  Surgeon: Fritzi Mandes, MD;  Location: [facility] OR;  Service: General;;   HEMOSTASIS CLIP PLACEMENT  01/19/2023   Procedure: HEMOSTASIS CLIP PLACEMENT;  Surgeon: Lynann Bologna, MD;  Location: [facility];  Service:  Gastroenterology;;   HEMOSTASIS CONTROL  01/19/2023   Procedure: HEMOSTASIS CONTROL;  Surgeon: Lynann Bologna, MD;  Location: [facility];  Service: Gastroenterology;;   IR ANGIOGRAM SELECTIVE EACH ADDITIONAL VESSEL  01/20/2023   IR ANGIOGRAM SELECTIVE EACH ADDITIONAL VESSEL  01/20/2023   IR ANGIOGRAM SELECTIVE EACH ADDITIONAL VESSEL  01/20/2023   IR ANGIOGRAM VISCERAL SELECTIVE  01/20/2023   IR ANGIOGRAM VISCERAL SELECTIVE  01/20/2023   IR CHEST FLUORO  01/20/2023   IR EMBO ART  VEN HEMORR LYMPH EXTRAV  INC GUIDE ROADMAPPING  01/20/2023   IR FLUORO GUIDE CV LINE RIGHT  01/20/2023   IR US GUIDE VASC ACCESS RIGHT  01/20/2023   IR US GUIDE VASC ACCESS RIGHT  01/20/2023   LAPAROSCOPIC ABDOMINAL EXPLORATION N/A 01/29/2023   Procedure: ReExploration Laparotomy, Removal of Abdominal Packing, Repacked Abdomen Packing;  Surgeon: Fritzi Mandes, MD;  Location: [facility];  Service: General;  Laterality: N/A;   LAPAROTOMY N/A 01/27/2023   Procedure: EXPLORATORY LAPAROTOMY, LYSIS OF ADHESIONS, DUODENOJEJUNAL BYPASS, PLACEMENT OF BILIARY T-TUBE, PLACEMENT OF DUODENOSTOMY TUBE;  Surgeon: Fritzi Mandes, MD;  Location: [facility];  Service: General;  Laterality: N/A;   LAPAROTOMY N/A 01/28/2023   Procedure: EXPLORATORY LAPAROTOMY, PARTIAL BOWEL RESECTION, AND ABTHERA WOUND VAC APPLICATION;  Surgeon: Fritzi Mandes, MD;  Location: [facility];  Service: General;  Laterality: N/A;  EIGHT 18X18 LAPS LEFT INSIDE WOUND SITE   LAPAROTOMY N/A 01/29/2023   Procedure: OPEN ABDOMINAL WASHOUT;  Surgeon: Fritzi Mandes, MD;  Location: [facility];  Service: General;  Laterality: N/A;   LAPAROTOMY N/A 01/30/2023   Procedure: REOPENING OF LAPAROTOMY, ABDOMINAL WASHOUT;  Surgeon: Fritzi Mandes, MD;  Location: [facility];  Service: General;  Laterality: N/A;   LAPAROTOMY N/A 02/01/2023   Procedure: RE-OPENING  OF LAPAROTOMY, ABDOMINAL WASHOUT, ABDOMINAL CLOSURE, ILEOSTOMY PLACEMENT;  Surgeon: Fritzi Mandes, MD;  Location: [facility];  Service: General;   Laterality: N/A;   SMALL INTESTINE SURGERY     1979   SUBMUCOSAL INJECTION  01/19/2023   Procedure: SUBMUCOSAL INJECTION;  Surgeon: Lynann Bologna, MD;  Location: [facility];  Service: Gastroenterology;;   TRANSURETHRAL RESECTION OF PROSTATE N/A 09/22/2017   Procedure: TRANSURETHRAL RESECTION OF THE PROSTATE (TURP);  Surgeon: Malen Gauze, MD;  Location: [facility];  Service: Urology;  Laterality: N/A;    Prior to Admission medications   Medication Sig Start Date End Date Taking? Authorizing Provider  acetaminophen (TYLENOL) 500 MG tablet Take 1,000 mg by mouth as needed for moderate pain.   Yes [provider]  aspirin-acetaminophen-caffeine (EXCEDRIN MIGRAINE) [phone] MG tablet Take 1 tablet by mouth as needed for headache or migraine.   Yes [provider]  cyanocobalamin 2000 MCG tablet Take 2,000 mcg by mouth daily.   Yes [provider]  Lactobacillus (FLORAJEN ACIDOPHILUS PO) Take 1 capsule by mouth daily.   Yes [provider]  ondansetron (ZOFRAN-ODT) 8 MG disintegrating tablet Take 8 mg by mouth every 8 (eight) hours as needed for vomiting or nausea. 01/10/23  Yes [provider]  pantoprazole (PROTONIX) 40 MG tablet Take by mouth. Patient not taking:  Reported on 01/14/2023 12/09/22 12/09/23  [provider]    Current Facility-Administered Medications  Medication Dose Route Frequency Provider Last Rate Last Admin   0.9 %  sodium chloride infusion   Intravenous PRN Fritzi Mandes, MD   Stopped at 02/09/23 1311   acetaminophen (TYLENOL) suppository 650 mg  650 mg Rectal Q8H PRN Gaynelle Adu, MD   650 mg at 01/31/23 1610   Chlorhexidine Gluconate Cloth 2 % PADS 6 each  6 each Topical Daily Gaynelle Adu, MD   6 each at 02/19/23 1018   clonazePAM (KLONOPIN) 0.1 mg/mL oral suspension 0.5 mg  0.5 mg Per Tube QHS Sophronia Simas L, MD   0.5 mg at 02/17/23 2147   dextrose 5 %-0.45 % sodium chloride infusion    Intravenous Continuous Fritzi Mandes, MD 100 mL/hr at 02/19/23 1100 Infusion Verify at 02/19/23 1100   diphenhydrAMINE (BENADRYL) injection 12.5 mg  12.5 mg Intravenous Q6H PRN Sophronia Simas L, MD   12.5 mg at 02/16/23 0052   enoxaparin (LOVENOX) injection 30 mg  30 mg Subcutaneous Q24H Sophronia Simas L, MD   30 mg at 02/19/23 1004   free water 30 mL  30 mL Per Tube Q8H Sophronia Simas L, MD   30 mL at 02/18/23 1400   HYDROmorphone (DILAUDID) injection 0.5 mg  0.5 mg Intravenous Q2H PRN Fritzi Mandes, MD   0.5 mg at 02/18/23 2017   methocarbamol (ROBAXIN) 500 mg in dextrose 5 % 50 mL IVPB  500 mg Intravenous Q8H Fritzi Mandes, MD   Stopped at 02/19/23 0030   ondansetron (ZOFRAN) injection 4 mg  4 mg Intravenous Q6H PRN Gaynelle Adu, MD   4 mg at 01/19/23 0416   Oral care mouth rinse  15 mL Mouth Rinse PRN Fritzi Mandes, MD       pantoprazole (PROTONIX) injection 40 mg  40 mg Intravenous Jeani Sow, MD   40 mg at 02/19/23 1006   sodium chloride flush (NS) 0.9 % injection 10-40 mL  10-40 mL Intracatheter Q12H Gaynelle Adu, MD   30 mL at 02/19/23 1018   sodium chloride flush (NS) 0.9 % injection 10-40 mL  10-40 mL Intracatheter PRN Gaynelle Adu, MD       sodium hypochlorite (DAKIN'S 1/4 STRENGTH) topical solution   Irrigation BID Violeta Gelinas, MD   Given at 02/19/23 1017    Allergies as of 01/13/2023   (No Known Allergies)    Family History  Problem Relation Age of Onset   Breast cancer Mother    Heart attack Father    Diabetes Other    Colon cancer Neg Hx    Esophageal cancer Neg Hx    Stomach cancer Neg Hx    Colon polyps Neg Hx    Rectal cancer Neg Hx     Social History   Socioeconomic History   Marital status: Married    Spouse name: Not on file   Number of children: 1   Years of education: Not on file   Highest education level: Not on file  Occupational History   Occupation: Teacher, music  Tobacco Use   Smoking status: Never   Smokeless tobacco: Never   Vaping Use   Vaping Use: Never used  Substance and Sexual Activity   Alcohol use: No    Alcohol/week: 0.0 standard drinks of alcohol   Drug use: No   Sexual activity: Not on file  Other Topics Concern   Not on file  Social  History Narrative   Not on file   Social Determinants of Health   Financial Resource Strain: Not on file  Food Insecurity: No Food Insecurity (01/13/2023)   Hunger Vital Sign    Worried About Running Out of Food in the Last Year: Never true    Ran Out of Food in the Last Year: Never true  Transportation Needs: No Transportation Needs (01/13/2023)   PRAPARE - Administrator, Civil Service (Medical): No    Lack of Transportation (Non-Medical): No  Physical Activity: Not on file  Stress: Not on file  Social Connections: Not on file  Intimate Partner Violence: Not At Risk (01/13/2023)   Humiliation, Afraid, Rape, and Kick questionnaire    Fear of Current or Ex-Partner: No    Emotionally Abused: No    Physically Abused: No    Sexually Abused: No    Review of Systems: Unable to be obtained as patient is very sleepy.  Physical Exam: Vital signs in last 24 hours: Temp:  [97.3 F (36.3 C)-98.2 F (36.8 C)] 97.9 F (36.6 C) (05/15 1157) Pulse Rate:  [79-91] 80 (05/15 1200) Resp:  [12-25] 15 (05/15 1200) BP: (91-120)/(51-65) 108/54 (05/15 1200) SpO2:  [98 %-100 %] 98 % (05/15 1200) Weight:  [72.4 kg] 72.4 kg (05/15 0712) Last BM Date : 02/16/23 General:  Alert, but sleepy.  Ill-appearing, in NAD.  Deep jaundice noted. Head:  Normocephalic and atraumatic. Eyes:  Scleral icterus noted. Ears:  Normal auditory acuity. Mouth:  No deformity or lesions.   Lungs:  Clear throughout to auscultation.  No wheezes, crackles, or rhonchi.  Heart:  Regular rate and rhythm; no murmurs, clicks, rubs, or gallops. Abdomen:  Soft, non-distended.  Midline dressing noted.  LUQ JP drain noted.  Duodenostomy with thin brown fluid. T tube with bilious output.  Msk:   Symmetrical without gross deformities. Pulses:  Normal pulses noted. Extremities:  Without clubbing or edema. Skin:  Intact without significant lesions or rashes.  Intake/Output from previous day: 05/14 0701 - 05/15 0700 In: 8910.6 [I.V.:5736.6; NG/GT:852; IV Piggyback:2312] Out: 6045 [Urine:1725; Emesis/NG output:4100; Drains:95; Stool:125] Intake/Output this shift: Total I/O In: 707.2 [I.V.:497.8; IV Piggyback:209.4] Out: 925 [Urine:775; Emesis/NG output:75; Drains:50; Stool:25]  Lab Results: Recent Labs    02/17/23 0430 02/18/23 0415 02/19/23 0515  WBC 16.2* 22.3* 20.1*  HGB 10.0* 9.4* 9.2*  HCT 30.1* 28.0* 27.1*  PLT 279 222 181   BMET Recent Labs    02/17/23 1435 02/18/23 0720 02/19/23 0515  NA 138 140 141  K 4.0 3.8 3.4*  CL 112* 114* 114*  CO2 16* 13* 16*  GLUCOSE 108* 107* 104*  BUN 69* 71* 78*  CREATININE 2.43* 2.26* 2.38*  CALCIUM 8.4* 7.9* 8.1*   LFT Recent Labs    02/17/23 0430 02/19/23 0903  PROT 5.3*  --   ALBUMIN 1.9*  --   AST 114*  --   ALT 135*  --   ALKPHOS 88  --   BILITOT 29.6* 31.3*   Studies/Results: DG Abd 1 View  Result Date: 02/18/2023 CLINICAL DATA:  NG tube placement. EXAM: ABDOMEN - 1 VIEW COMPARISON:  01/19/2023 FINDINGS: Enteric tube is noted with tip overlying the mid stomach and side hole overlying the proximal to mid stomach. Bowel gas pattern is unchanged. IMPRESSION: Enteric tube with tip overlying the mid stomach. Electronically Signed   By: Harmon Pier M.D.   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 RADIATION DOSE  REDUCTION: This exam was performed according to the departmental dose-optimization program which includes automated exposure control, adjustment of the mA and/or kV according to patient size and/or use of iterative reconstruction technique. COMPARISON:  CT abdomen and pelvis dated 02/07/2023 FINDINGS: Lower chest: Similar right-greater-than-left lower lobe subsegmental atelectasis. Partially imaged moderate bilateral pleural effusions, similar on the right and slightly decreased on the left compared to 02/07/2023. Partially imaged heart size is normal. Hepatobiliary: Mildly heterogeneous parenchymal attenuation. Mild periportal edema. No intra or extrahepatic biliary ductal dilation. Cholecystectomy. Pancreas: No focal lesions or main ductal dilation. Spleen: Normal in size without focal abnormality. Adrenals/Urinary Tract: No adrenal nodules. No suspicious renal mass, calculi or hydronephrosis. Urinary bladder is decompressed with catheter in-situ. Stomach/Bowel: Multiple surgical clips near the gastroesophageal junction. Interval increased distention of the stomach and multiple right hemi abdominal small bowel loops which contains fecalized material more distally. 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 Right upper quadrant anastomosis is patent. Distal colon is decompressed and contains residual hyperattenuating material. Vascular/Lymphatic: No significant vascular findings are present. No enlarged abdominal or pelvic lymph nodes. Reproductive: Prostate is unremarkable. Other: Similar position of right upper quadrant biliary drainage catheter terminating near the hepatic hilum. A left lower quadrant approach drainage catheter courses along the anterior abdomen to terminate along the right flank, where there is new fecalized material. Right upper quadrant enteric catheter terminates in the proximal duodenum. Right lower quadrant enteric catheter terminates  within a loop of right lower quadrant small bowel, which is markedly dilated and contains fecalized material. Small volume intraperitoneal free air appears slightly increased compared to 02/07/2023 (3:28). Multiple additional small foci of extraluminal gas throughout the abdomen. Interval development of gas-containing fluid collection in the right paracolic gutter measuring 5.1 x 2.9 cm (3:56). Right hemipelvic collection measures 4.0 x 3.5 cm (3:79) which may correspond to redistribution of previously noted fluid collection in this area measuring 5.4 x 2.5 cm. A small fluid collection is also likely present within the left paracolic gutter but is not well delineated from adjacent decompressed colon in the absence of contrast material. Small volume fluid throughout the abdomen. Musculoskeletal: No acute or abnormal lytic or blastic osseous lesions. Postsurgical changes of the anterior abdominal wall. Diffuse body wall edema. IMPRESSION: 1. Interval increased distention of the stomach and multiple right hemi abdominal small bowel loops which contain fecalized material, suspicious for small bowel obstruction. 2. Increased small volume intraperitoneal free air with multiple additional small foci of extraluminal gas throughout the abdomen, including adjacent to the surgical drainage catheter terminating in the right flank, where there is new feculent extraluminal material, suspicious for bowel perforation/dehiscence. 3. Interval development of gas-containing fluid collection in the right paracolic gutter measuring 5.1 x 2.9 cm. Right hemipelvic collection measures 4.0 x 3.5 cm. 4. Partially imaged moderate bilateral pleural effusions, similar on the right and slightly decreased on the left compared to 02/07/2023. 5. Diffuse body wall edema. Electronically Signed   By: Agustin Cree M.D.   On: 02/18/2023 09:14    IMPRESSION:  *Duodenal obstruction status post duodenal bypass that was complicated due to adhesions and  resulted in a small common bile duct injury  requiring a T-tube placement and external duodenostomy.  Had a massive bleed and massive transfusion protocol. *Elevated total bili, LFTs: Initially thought to be due to shock liver and maybe hemolysis after massive transfusions.  All of that occurred a few weeks ago now.  Other LFTs are trending down, but total bili remains elevated at 31.3.  He had a cholangio through the T-tube that was patent on 5/3.  Liver Doppler/duplex showed diffuse increased echogenicity of the hepatic parenchyma indicative of hepatocellular dysfunction, most commonly steatosis, but flow all looks good.  Noncontrast CT scan looks okay.  T tube with bilious output.  Does not appear to be an obstructive issue.  No known history of underlying liver disease.  He is not on any medications that should be causing this. *Severe malnutrition:  Not able to tolerate enteral feeds and TNA is on hold due to elevated total bili.  PLAN: -Will await Dr. Irving Burton thoughts and input.  Princella Pellegrini. Zehr  02/19/2023, 1:36 PM   I have taken an interval history, thoroughly reviewed the chart and examined the patient. I agree with the Advanced Practitioner's note, impression and recommendations, and have recorded additional findings, impressions and recommendations below. I performed a substantive portion of this encounter (>50% time spent), including a complete performance of the medical decision making.  Very complex case requiring extensive chart review in addition to in person evaluation.  Total time 75 minutes on this consult.  My additional thoughts are as follows:   Bile was flowing from his T-tube that is believed to be in correct position, and no biliary obstruction noted on imaging.  He has recurrent fluid collections, possible anastomotic leak, and now ileus as well on yesterday CTAP.  Multifactorial cholestatic jaundice from critical illness, postoperative bleeding and perhaps other episodes  of hypotension that may have caused ischemic hepatopathy, antibiotics and other medicines and TPN. Fortunately, his INR remains only mildly elevated 1.6, so he is not in hepatic failure.  His TPN was stopped out of concern for the rising bilirubin, which I can certainly understand.  Unfortunately, given the ileus and need for NG tube drainage in the setting of profound protein calorie malnutrition, I see no choice but to resume his TPN.  If he does not receive sufficient nutrition, he will have no meaningful chance of recovery.  I will communicate this to his surgical attending, and I recommend checking his LFTs and PT/INR every few days.  We will check back on him in a few days, call sooner if needed.  (Patient quite somnolent for much of the exam, so I had a discussion with his sister and brother at the bedside)  Charlie Pitter III Office:[phone]

## 2023-02-19 NOTE — TOC Progression Note (Signed)
Transition of Care (TOC) - Progression Note    Patient Details  Name: Bradley Dominguez MRN: [id number] Date of Birth: 08/01/1956  Transition of Care (TOC) CM/SW Contact  Necha Harries S Hayla Hinger, LCSW Phone Number: 02/19/2023, 9:35 AM  Clinical Narrative:    TOC following for potential needs once patient is medically stable.      Barriers to Discharge: Continued Medical Work up  Expected Discharge Plan and Services                                               Social Determinants of Health (SDOH) Interventions SDOH Screenings   Food Insecurity: No Food Insecurity (01/13/2023)  Housing: Low Risk  (01/13/2023)  Transportation Needs: No Transportation Needs (01/13/2023)  Utilities: Not At Risk (01/13/2023)  Tobacco Use: Low Risk  (02/02/2023)    Readmission Risk Interventions     No data to display          

## 2023-02-19 NOTE — TOC Progression Note (Signed)
Transition of Care [facility]) - Progression Note    Patient Details  Name: Bradley Dominguez MRN: [id number] Date of Birth: [birth year]  [facility] of [facility]) CM/SW Contact  Mearl Latin, LCSW Phone Number: 02/19/2023, 9:31 AM  Clinical Narrative:    Patient transferred from [facility]. TOC following for potential needs once patient is medically stable.      Barriers to Discharge: Continued Medical Work up  Expected Discharge Plan and Services                                               Social Determinants of Health (SDOH) Interventions SDOH Screenings   Food Insecurity: No Food Insecurity (01/13/2023)  Housing: Low Risk  (01/13/2023)  Transportation Needs: No Transportation Needs (01/13/2023)  Utilities: Not At Risk (01/13/2023)  Tobacco Use: Low Risk  (02/02/2023)    Readmission Risk Interventions     No data to display

## 2023-02-19 NOTE — TOC Progression Note (Signed)
Transition of Care (TOC) - Progression Note    Patient Details  Name: Efraim Sequeira MRN: [id number] Date of Birth: 12/10/1955  Transition of Care (TOC) CM/SW Contact  Kamerin Grumbine S Meliana Canner, LCSW Phone Number: 02/19/2023, 9:35 AM  Clinical Narrative:    TOC following for potential needs once patient is medically stable.      Barriers to Discharge: Continued Medical Work up  Expected Discharge Plan and Services                                               Social Determinants of Health (SDOH) Interventions SDOH Screenings   Food Insecurity: No Food Insecurity (01/13/2023)  Housing: Low Risk  (01/13/2023)  Transportation Needs: No Transportation Needs (01/13/2023)  Utilities: Not At Risk (01/13/2023)  Tobacco Use: Low Risk  (02/02/2023)    Readmission Risk Interventions     No data to display          

## 2023-02-19 NOTE — TOC Progression Note (Signed)
Transition of Care (TOC) - Progression Note    Patient Details  Name: Bradley Dominguez MRN: [id number] Date of Birth: 04/16/1956  Transition of Care (TOC) CM/SW Contact  Kajuana Shareef S Vern Guerette, LCSW Phone Number: 02/19/2023, 9:35 AM  Clinical Narrative:    TOC following for potential needs once patient is medically stable.      Barriers to Discharge: Continued Medical Work up  Expected Discharge Plan and Services                                               Social Determinants of Health (SDOH) Interventions SDOH Screenings   Food Insecurity: No Food Insecurity (01/13/2023)  Housing: Low Risk  (01/13/2023)  Transportation Needs: No Transportation Needs (01/13/2023)  Utilities: Not At Risk (01/13/2023)  Tobacco Use: Low Risk  (02/02/2023)    Readmission Risk Interventions     No data to display          

## 2023-02-19 NOTE — Progress Notes (Signed)
General Surgery Follow Up Note  Subjective:    Overnight Issues: No acute changes. Creatinine stable at 2.3, UOP improved. Patient feels better after with NG decompression.  Objective:  Vital signs for last 24 hours: Temp:  [97.3 F (36.3 C)-98.2 F (36.8 C)] 97.9 F (36.6 C) (05/15 0800) Pulse Rate:  [79-92] 79 (05/15 0600) Resp:  [12-43] 14 (05/15 0600) BP: (83-113)/(47-65) 100/54 (05/15 0600) SpO2:  [97 %-100 %] 98 % (05/15 0600)  Hemodynamic parameters for last 24 hours:    Intake/Output from previous day: 05/14 0701 - 05/15 0700 In: 8910.6 [I.V.:5736.6; NG/GT:852; IV Piggyback:2312] Out: 6045 [Urine:1725; Emesis/NG output:4100; Drains:95; Stool:125]  Intake/Output this shift: No intake/output data recorded.  Vent settings for last 24 hours:    Physical Exam:  Gen: NAD, appears frail and ill Neuro: follows commands, alert, communicative HEENT: scleral icterus; NG in place draining gastric contents CV: RRR Pulm: nonlabored respirations on room air Abd: soft, nondistended, midline wound with green/yellow fibrinous exudate, fascia in tact. LUQ JP with minimal thin brown fluid. Duodenostomy with thin brown fluid. T tube with bilious output. GU: urine  dark yellow, , +Foley Extr: wwp, no edema  Results for orders placed or performed during the hospital encounter of 01/13/23 (from the past 24 hour(s))  Glucose, capillary     Status: None   Collection Time: 02/18/23  8:38 AM  Result Value Ref Range   Glucose-Capillary 98 70 - 99 mg/dL  Glucose, capillary     Status: Abnormal   Collection Time: 02/18/23  4:05 PM  Result Value Ref Range   Glucose-Capillary 111 (H) 70 - 99 mg/dL  Glucose, capillary     Status: Abnormal   Collection Time: 02/18/23  7:46 PM  Result Value Ref Range   Glucose-Capillary 101 (H) 70 - 99 mg/dL  Glucose, capillary     Status: None   Collection Time: 02/18/23 11:41 PM  Result Value Ref Range   Glucose-Capillary 91 70 - 99 mg/dL  Glucose,  capillary     Status: Abnormal   Collection Time: 02/19/23  3:35 AM  Result Value Ref Range   Glucose-Capillary 100 (H) 70 - 99 mg/dL  CBC     Status: Abnormal   Collection Time: 02/19/23  5:15 AM  Result Value Ref Range   WBC 20.1 (H) 4.0 - 10.5 K/uL   RBC 3.15 (L) 4.22 - 5.81 MIL/uL   Hemoglobin 9.2 (L) 13.0 - 17.0 g/dL   HCT 48.5 (L) 46.2 - 70.3 %   MCV 86.0 80.0 - 100.0 fL   MCH 29.2 26.0 - 34.0 pg   MCHC 33.9 30.0 - 36.0 g/dL   RDW 50.0 (H) 93.8 - 18.2 %   Platelets 181 150 - 400 K/uL   nRBC 0.0 0.0 - 0.2 %  Basic metabolic panel     Status: Abnormal   Collection Time: 02/19/23  5:15 AM  Result Value Ref Range   Sodium 141 135 - 145 mmol/L   Potassium 3.4 (L) 3.5 - 5.1 mmol/L   Chloride 114 (H) 98 - 111 mmol/L   CO2 16 (L) 22 - 32 mmol/L   Glucose, Bld 104 (H) 70 - 99 mg/dL   BUN 78 (H) 8 - 23 mg/dL   Creatinine, Ser 9.93 (H) 0.61 - 1.24 mg/dL   Calcium 8.1 (L) 8.9 - 10.3 mg/dL   GFR, Estimated 29 (L) >60 mL/min   Anion gap 11 5 - 15  Glucose, capillary     Status: None  Collection Time: 02/19/23  8:18 AM  Result Value Ref Range   Glucose-Capillary 92 70 - 99 mg/dL    Assessment & Plan: The plan of care was discussed with the bedside nurse for the day, who is in agreement with this plan and no additional concerns were raised.   Present on Admission:  Bowel obstruction (HCC)  Protein-calorie malnutrition, severe (HCC)  Duodenal obstruction    LOS: 37 days   Additional comments:I reviewed the patient's new clinical lab test results.   and I reviewed the patients new imaging test results.    67 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  4/22 - Ex lap, adhesiolysis, duodenojejunal bypass, duodenostomy tube placement, T tube placement in common bile duct. 4/23 - Takeback for bleeding with hemorrhagic shock, resection of ischemic transverse colon, left in discontinuity with Abthera 4/24 - Takeback for bleeding, abdominal washout, ileocecectomy, pack placement, placement  of Abthera 4/25 - Takeback, resection of 10cm necrotic distal ileum, washout, removal of packs, placement of Abthera 4/27 - Takeback, placement of J tube, end ileostomy, abdominal closure   - Hyperbilirubinemia: Tube cholangiogram on 5/3 showed patency of the biliary tree with no obstruction. Massive transfusion very likely contributed, as well as shock liver. Repeat LFTs pending today.  - Keep T tube and D tube to gravity drainage. JP to bulb suction. Drain output decreased with NG decompression. T tube output now appears to be purely bile, appropriately. - Pain: prn dilaudid - Agitation/delirium: Improved. wean Seroquel qhs. Klonipin qhs. - ID: Pseudomonas pneumonia, resistant to Zosyn and cefepime. S/p Ciprofloxacin x7d. - Wound care: dakins to midline wound for 3 days, then saline wet-to-dry. - FEN: NPO, ok for ice chips. Hold tube feeds given small bowel distension secondary to ileus and likely dysmotility. TPN on hold due to hyperbilirubinemia. LFTs pending today. Continue maintenance IV fluids at 100 ml/hr (changed to D5 1/2 NS yesterday given hyperchloremia).  - AKI: Hypovolemia secondary to GI losses. UOP improving with hydration, creatinine stable. Give 500 ml 5% albumin today. - WOC following for ostomy care and teaching - PT/OT following. Patient is very weak and deconditioned. - VTE: SCDs, lovenox - Dispo: ICU  Sophronia Simas, MD [facility] Surgery General, Hepatobiliary and Pancreatic Surgery 02/19/23 8:33 AM

## 2023-02-19 NOTE — TOC Progression Note (Signed)
Transition of Care [facility]) - Progression Note    Patient Details  Name: Reagen Longenberger MRN: [id number] Date of Birth: 1955-11-01  Transition of Care [facility]) CM/SW Contact  Mearl Latin, LCSW Phone Number: 02/19/2023, 9:32 AM  Clinical Narrative:    TOC following for potential needs once patient is medically stable.      Barriers to Discharge: Continued Medical Work up  Expected Discharge Plan and Services                                               Social Determinants of Health (SDOH) Interventions SDOH Screenings   Food Insecurity: No Food Insecurity (01/13/2023)  Housing: Low Risk  (01/13/2023)  Transportation Needs: No Transportation Needs (01/13/2023)  Utilities: Not At Risk (01/13/2023)  Tobacco Use: Low Risk  (02/02/2023)    Readmission Risk Interventions     No data to display

## 2023-02-19 NOTE — TOC Progression Note (Signed)
Transition of Care [facility]) - Progression Note    Patient Details  Name: Bradley Dominguez MRN: [id number] Date of Birth: Sep 22, 1956  Transition of Care [facility]) CM/SW Contact  Mearl Latin, LCSW Phone Number: 02/19/2023, 9:35 AM  Clinical Narrative:    TOC following for potential needs once patient is medically stable.      Barriers to Discharge: Continued Medical Work up  Expected Discharge Plan and Services                                               Social Determinants of Health (SDOH) Interventions SDOH Screenings   Food Insecurity: No Food Insecurity (01/13/2023)  Housing: Low Risk  (01/13/2023)  Transportation Needs: No Transportation Needs (01/13/2023)  Utilities: Not At Risk (01/13/2023)  Tobacco Use: Low Risk  (02/02/2023)    Readmission Risk Interventions     No data to display

## 2023-02-19 NOTE — TOC Progression Note (Signed)
Transition of Care [facility]) - Progression Note    Patient Details  Name: Bradley Dominguez MRN: [id number] Date of Birth: 1956/10/07  Transition of Care [facility]) CM/SW Contact  Mearl Latin, LCSW Phone Number: 02/19/2023, 9:31 AM  Clinical Narrative:    TOC following for potential needs once patient is medically stable.      Barriers to Discharge: Continued Medical Work up  Expected Discharge Plan and Services                                               Social Determinants of Health (SDOH) Interventions SDOH Screenings   Food Insecurity: No Food Insecurity (01/13/2023)  Housing: Low Risk  (01/13/2023)  Transportation Needs: No Transportation Needs (01/13/2023)  Utilities: Not At Risk (01/13/2023)  Tobacco Use: Low Risk  (02/02/2023)    Readmission Risk Interventions     No data to display

## 2023-02-20 LAB — CBC
HCT: 24.8 % — ABNORMAL LOW (ref 39.0–52.0)
HCT: 27.2 % — ABNORMAL LOW (ref 39.0–52.0)
Hemoglobin: 8.2 g/dL — ABNORMAL LOW (ref 13.0–17.0)
Hemoglobin: 9.1 g/dL — ABNORMAL LOW (ref 13.0–17.0)
MCH: 28.8 pg (ref 26.0–34.0)
MCH: 29 pg (ref 26.0–34.0)
MCHC: 33.1 g/dL (ref 30.0–36.0)
MCHC: 33.5 g/dL (ref 30.0–36.0)
MCV: 87 fL (ref 80.0–100.0)
MCV: 86.6 fL (ref 80.0–100.0)
Platelets: 154 10*3/uL (ref 150–400)
Platelets: 178 10*3/uL (ref 150–400)
RBC: 2.85 MIL/uL — ABNORMAL LOW (ref 4.22–5.81)
RBC: 3.14 MIL/uL — ABNORMAL LOW (ref 4.22–5.81)
RDW: 22.3 % — ABNORMAL HIGH (ref 11.5–15.5)
RDW: 22 % — ABNORMAL HIGH (ref 11.5–15.5)
WBC: 15.4 10*3/uL — ABNORMAL HIGH (ref 4.0–10.5)
WBC: 16.5 10*3/uL — ABNORMAL HIGH (ref 4.0–10.5)
nRBC: 0 % (ref 0.0–0.2)
nRBC: 0 % (ref 0.0–0.2)

## 2023-02-20 LAB — COMPREHENSIVE METABOLIC PANEL
ALT: 106 U/L — ABNORMAL HIGH (ref 0–44)
AST: 106 U/L — ABNORMAL HIGH (ref 15–41)
Albumin: 2 g/dL — ABNORMAL LOW (ref 3.5–5.0)
Alkaline Phosphatase: 89 U/L (ref 38–126)
Anion gap: 10 (ref 5–15)
BUN: 64 mg/dL — ABNORMAL HIGH (ref 8–23)
CO2: 15 mmol/L — ABNORMAL LOW (ref 22–32)
Calcium: 7.5 mg/dL — ABNORMAL LOW (ref 8.9–10.3)
Chloride: 115 mmol/L — ABNORMAL HIGH (ref 98–111)
Creatinine, Ser: 2.01 mg/dL — ABNORMAL HIGH (ref 0.61–1.24)
GFR, Estimated: 36 mL/min — ABNORMAL LOW (ref >60)
Glucose, Bld: 95 mg/dL (ref 70–99)
Potassium: 2.6 mmol/L — CL (ref 3.5–5.1)
Sodium: 140 mmol/L (ref 135–145)
Total Bilirubin: 29.4 mg/dL (ref 0.3–1.2)
Total Protein: 5.2 g/dL — ABNORMAL LOW (ref 6.5–8.1)

## 2023-02-20 LAB — PHOSPHORUS: Phosphorus: 5.7 mg/dL — ABNORMAL HIGH (ref 2.5–4.6)

## 2023-02-20 LAB — MAGNESIUM: Magnesium: 2.1 mg/dL (ref 1.7–2.4)

## 2023-02-20 LAB — GLUCOSE, CAPILLARY
Glucose-Capillary: 90 mg/dL (ref 70–99)
Glucose-Capillary: 99 mg/dL (ref 70–99)
Glucose-Capillary: 106 mg/dL — ABNORMAL HIGH (ref 70–99)
Glucose-Capillary: 100 mg/dL — ABNORMAL HIGH (ref 70–99)
Glucose-Capillary: 120 mg/dL — ABNORMAL HIGH (ref 70–99)

## 2023-02-20 MED ORDER — ENOXAPARIN SODIUM 40 MG/0.4ML IJ SOSY
40.0000 mg | PREFILLED_SYRINGE | INTRAMUSCULAR | Status: DC
  Administered 2023-02-21 – 2023-02-23 (×3): 40 mg via SUBCUTANEOUS
  Filled 2023-02-20 (×3): qty 0.4

## 2023-02-20 MED ORDER — ZINC CHLORIDE 1 MG/ML IV SOLN
INTRAVENOUS | Status: AC
  Filled 2023-02-20: qty 566.4

## 2023-02-20 MED ORDER — DEXTROSE-NACL 5-0.45 % IV SOLN
INTRAVENOUS | Status: DC
  Filled 2023-02-20: qty 1000

## 2023-02-20 MED ORDER — POTASSIUM CHLORIDE 10 MEQ/50ML IV SOLN
10.0000 meq | INTRAVENOUS | Status: AC
  Administered 2023-02-20 (×6): 10 meq via INTRAVENOUS
  Filled 2023-02-20 (×6): qty 50

## 2023-02-20 NOTE — TOC Progression Note (Signed)
Transition of Care [facility]) - Progression Note    Patient Details  Name: Bradley Dominguez MRN: [id number] Date of Birth: 08-14-1956  Transition of Care [facility]) CM/SW Contact  Mearl Latin, LCSW Phone Number: 02/20/2023, 3:33 PM  Clinical Narrative:    TOC following for potential needs once patient is medically stable.      Barriers to Discharge: Continued Medical Work up  Expected Discharge Plan and Services                                               Social Determinants of Health (SDOH) Interventions SDOH Screenings   Food Insecurity: No Food Insecurity (01/13/2023)  Housing: Low Risk  (01/13/2023)  Transportation Needs: No Transportation Needs (01/13/2023)  Utilities: Not At Risk (01/13/2023)  Tobacco Use: Low Risk  (02/02/2023)    Readmission Risk Interventions     No data to display

## 2023-02-20 NOTE — Consult Note (Signed)
WOC Nurse ostomy follow up Stoma type/location: LLQ colostomy Stomal assessment/size: 1 1/2" slightly oval, cut off center to accommodate JP drain.  Peristomal assessment: intact  midline incision present  LUQ JP drain Treatment options for stomal/peristomal skin: barrier ring Output liquid yellow stool with some solid particulates noted.  Ostomy pouching: 2pc.  2 3/4" pouch with roll closure.  High output pouch drain may become clogged with effluent being expelled at this time. One high output pouch left in room.  Has 3 roll closure pouch systems as well.  Education provided: Wife performed pouch change today.  Minimal cueing.  I applied foam dressing, cut like a drain sponge due to close proximity.  This overlaps ostomy pouching field.  Enrolled patient in [place] Secure Start Discharge program: Yes previously Will follow.  Mike Gip MSN, RN, FNP-BC CWON Wound, Ostomy, Continence Nurse Outpatient [facility] [phone] Pager [phone]

## 2023-02-20 NOTE — Progress Notes (Signed)
PHARMACY - TOTAL PARENTERAL NUTRITION CONSULT NOTE   Indication: Prolonged ileus, intolerance to enteral feeding  Patient Measurements: Height: 6\' 1"  (185.4 cm) Weight: 69.9 kg (154 lb 1.6 oz) IBW/kg (Calculated) : 79.9 TPN AdjBW (KG): 65.5 Body mass index is 20.33 kg/m.  Assessment: 80 yom with hx intestinal surgery and subsequent small bowel obstructions requiring additional resection, admitted 01/13/23 with recurrent issues with SBO at the duodenum. Now s/p duodenal bypass complicated due to adhesions and resulted in small common bile duct injury requiring T-tube placement and external duodenostomy. Patient with massive bleed/hemorrhagic shock, s/p MTP. TPN initiated 4/9-5/6, then transitioned to TF. TF then held 5/14 given small bowel distension secondary to suspected ileus and likely dysmotility. Pharmacy consulted to resume TPN 5/16.  Glucose / Insulin: no hx DM. CBGs controlled and previously well-controlled on TPN with no insulin required Electrolytes: Na 140 stable (previously none or only small amount in TPN), K 3.4>2.6, Cl 115 stable, CO2 down to 15, Phos stable 5.7, Mag down to 2.1, others WNL Renal: AKI resolving - SCr down to 2.01 (baseline ~0.7-0.9), BUN down to 64 Hepatic: LFTs trend back up - AST 106/ALT 106, Tbili back up to 29.4 (peak ~30, noted scleral icterus), TG WNL, albumin 2 Intake / Output; MIVF: UOP 1.4 ml/kg/hr, NGT output 975 ml, Biliary T-tube 255 ml, LUQ JP drain 20 ml, duodenostomy ; MIVF: D51/2NS at 100 ml/hr GI Imaging:  5/14 CT abd pelvis - feculent material throughout the SB/abdomen, suspicious for SB obstruction, bowel perforation/dehiscence, interval development of gas-containing fluid collection in R paracolic gutter 5/3 CT abdomen: bowel edema, no cause identified for T-bili elevation  5/3 cholangiogram: mild dilation of bilary tract   4/4 UGI: Findings suggestive of chronic pSBO or ileus 4/9 CT: Ileus vs high grade partial SBO GI Surgeries /  Procedures: 4/22 - Ex lap, adhesiolysis, duodenojejunal bypass, duodenostomy tube placement, T tube placement in common bile duct. 4/23 - Takeback for bleeding with hemorrhagic shock, resection of ischemic transverse colon, left in discontinuity with Abthera 4/24 - Takeback for bleeding, abdominal washout, ileocecectomy, pack placement, placement of Abthera 4/25 - Takeback, resection of 10cm necrotic distal ileum, washout, removal of packs, placement of Abthera 4/27 - Takeback, placement of J tube, end ileostomy, abdominal closure  Central access: triple lumen PICC, placed 01/23/23 TPN start date: 01/14/23-02/10/23, 5/16>>  Nutritional Goals: Goal TPN rate is 95 mL/hr (provides 134 g of protein and 2307 kcals per day)  RD Assessment: Estimated Needs Total Energy Estimated Needs: 2300-2600 Total Protein Estimated Needs: 130-145 grams Total Fluid Estimated Needs: 2.1L/day  Current Nutrition:  NPO  Plan:  Start TPN at 40 mL/hr at 1800. Titrate to goal as appropriate. Monitor for refeeding. Electrolytes in TPN: Na 54mEq/L (based on previous requirements, K 40 mEq/L (based on previous requirements and current AKI), Ca 28mEq/L, Mag 75mEq/L, remove Phos. Cl:Ac 1:2 Give KCl IV x 6 (discussed with Dr. Freida Busman) Add standard MVI to TPN. Remove standard trace elements with elevated Tbili and add back zinc 5mg . Hold selenium and chromium for now with AKI. F/u resumption as renal function returns to baseline Will not initiate SSI at this time, as patient did not require for CBG control when previously on TPN this admission. Monitor CBG trend and adjust as needed. Reduce MIVF (D51/2NS) to 60 mL/hr at 1800 when TPN bag hung Monitor TPN labs daily until stable, then standard Mon/Thurs Watch LFT trend - may need to reduce lipid content if trends up further   Francene Finders  [place] Bilberry, PharmD, BCPS Please check AMION for all [facility] contact numbers Clinical Pharmacist 02/20/2023 8:52 AM

## 2023-02-20 NOTE — Progress Notes (Signed)
Occupational Therapy Treatment Patient Details Name: Bradley Dominguez MRN: [id number] DOB: 12-04-1955 Today's Date: 02/20/2023   History of present illness 67 yo M HF, chronic partial small bowel obstruction and multiple previous abd surgeries  who was admitted  4/8 from home w n/v/bloating. Underwent EGD 4/11 -- concerning for duodenal obstruction. Developed hemorrhagic shock 4/15 in the setting of hematemesis / hematochezia, decompensated requiring, intubation, embolization of jejunal arcade branch. Extubated 01/22/23. Underwent ex lap adhesiolysis, duodenojejunal bypass, duodenostomy tube placement, T tube placement in common bile duct 4/22, transferred to [facility] 4/23 with hemorrhagic shock, return to OR 4/23, 4/24, 4/25 and finally 4/27 with placement of J tube, end ileostomy, abdominal closure.  Extubated 4/30.  NGT placed 5/14 due to possible ileus.   OT comments  Pt currently tolerated sitting EOB this session for 15 mins with min assist progressing to min guard.  Vitals stable throughout with HR staying 100 BPM or below.  BP stable at 110/63 and 96/64 while sitting.  Pt worked in spooning and self feeding ice chips a few bites with overall min assist and also BUE shoulder flexion exercises.  Wife supportive and assisted with encouragement and managing lines throughout.  Recommend continued acute care OT to help increased strength and endurance for basic selfcare tasks.  Feel pt will benefit from post acute rehab greater than 3 hours once endurance improves and medical issues stabilize.     Recommendations for follow up therapy are one component of a multi-disciplinary discharge planning process, led by the attending physician.  Recommendations may be updated based on patient status, additional functional criteria and insurance authorization.    Assistance Recommended at Discharge Frequent or constant Supervision/Assistance  Patient can return home with the following  A little help with walking and/or  transfers;A little help with bathing/dressing/bathroom;Assistance with cooking/housework;Assist for transportation;Help with stairs or ramp for entrance;Direct supervision/assist for medications management   Equipment Recommendations  Other (comment) (TBD next venue of care)    Recommendations for Other Services Rehab consult    Precautions / Restrictions Precautions Precautions: Fall Precaution Comments: multiple lines, JP drain, biliary tube, ileostomy; abdominal incision; NGT to suction Restrictions Weight Bearing Restrictions: No       Mobility Bed Mobility Overal bed mobility: Needs Assistance Bed Mobility: Supine to Sit     Supine to sit: Mod assist, +2 for physical assistance Sit to supine: Mod assist, +2 for physical assistance   General bed mobility comments: Pt needed assist with initiation and moving LEs off of the EOB and then bringing trunk up to sitting.  For transition back to the bed, he needed assist with bringing LEs up into the bed and for easing trunk down.    Transfers Overall transfer level: Needs assistance         Squat pivot transfers: Mod assist, +2 safety/equipment       General transfer comment: Mod +2 for squat pivots up the EOB.     Balance Overall balance assessment: Needs assistance Sitting-balance support: Feet supported Sitting balance-Leahy Scale: Fair Sitting balance - Comments: Min assist initially but then progressed to min guard with static sitting.  Min assist needed for balance while completing BUE exercises.   Standing balance support: Bilateral upper extremity supported Standing balance-Leahy Scale: Poor                             ADL either performed or assessed with clinical judgement   ADL Overall ADL's :  Needs assistance/impaired Eating/Feeding: Minimal assistance;Sitting Eating/Feeding Details (indicate cue type and reason): to self feed ice chips with the RUE                                  Functional mobility during ADLs: Moderate assistance;+2 for safety/equipment (supine to sit EOB.) General ADL Comments: Pt sat EOB for 15 min with min to min guard assist during session.  BP in sitting at 110/63 and 96/64 with HR at 84 BPM and oxygen at 94% on room air.  Engaged in working on maintaining cervical extension to neutral in sitting for brief intervals while completing BUE shoulder flexion exercise for 10 reps with min facilitation while maintaining balance.  Scooted up the EOB with mod assist in order to return to supine.  Assisted with changing out soiled drain sponges once in supine with HOB elevated.      Cognition Arousal/Alertness: Awake/alert Behavior During Therapy: Flat affect Overall Cognitive Status: Impaired/Different from baseline Area of Impairment: Orientation                       Following Commands: Follows one step commands with increased time   Awareness: Intellectual Problem Solving: Slow processing, Decreased initiation, Requires tactile cues, Requires verbal cues General Comments: Pt not verbal when asked but did nod head "yes and no" at times.  When asked if he knew where he was, he nodded "no".                   Pertinent Vitals/ Pain       Pain Assessment Pain Assessment: Faces Faces Pain Scale: Hurts a little bit Pain Location: abdomen Pain Descriptors / Indicators: Discomfort, Grimacing Pain Intervention(s): Monitored during session, Repositioned         Frequency  Min 2X/week        Progress Toward Goals  OT Goals(current goals can now be found in the care plan section)  Progress towards OT goals:  (goals renewed as they are still appropriate)  Acute Rehab OT Goals OT Goal Formulation: With patient/family Time For Goal Achievement: 03/06/23 Potential to Achieve Goals: Good  Plan Discharge plan remains appropriate       AM-PAC OT "6 Clicks" Daily Activity     Outcome Measure   Help from another person eating  meals?: A Little Help from another person taking care of personal grooming?: A Lot Help from another person toileting, which includes using toliet, bedpan, or urinal?: A Lot Help from another person bathing (including washing, rinsing, drying)?: A Lot Help from another person to put on and taking off regular upper body clothing?: A Lot Help from another person to put on and taking off regular lower body clothing?: A Lot 6 Click Score: 13    End of Session    OT Visit Diagnosis: Unsteadiness on feet (R26.81);Other abnormalities of gait and mobility (R26.89);Muscle weakness (generalized) (M62.81);Other symptoms and signs involving cognitive function;Pain Pain - Right/Left: Right   Activity Tolerance Patient limited by fatigue   Patient Left in bed;with call bell/phone within reach;with family/visitor present   Nurse Communication Mobility status        Time: 1610-9604 OT Time Calculation (min): 36 min  Charges: OT General Charges $OT Visit: 1 Visit OT Treatments $Self Care/Home Management : 23-37 mins  Perrin Maltese, OTR/L Acute Rehabilitation Services  Office [phone] 02/20/2023

## 2023-02-20 NOTE — Progress Notes (Signed)
Brief Nutrition Note  Discussed pt with Dr. Freida Busman. Plan is to resume TPN today due to bilirubin appearing to have peaked/stabilized and pt with severe malnutrition. No plans to resume tube feeds today.  Reviewed TPN Pharmacist note. TPN starting back today at 1800 at rate of 40 ml/hr. Goal rate is 95 ml/hr which will provide 2307 kcal and 134 grams of protein daily.  RD will continue to monitor pt during admission.   Mertie Clause, MS, RD, LDN Inpatient Clinical Dietitian Please see AMiON for contact information.

## 2023-02-20 NOTE — Progress Notes (Signed)
General Surgery Follow Up Note  Subjective:    Overnight Issues: WBC downtrending, CMP pending. Tbili 26 on LFTs yesterday afternoon. Patient slightly more alert this morning. Good UOP. Drain output remains low with NG decompression.  Objective:  Vital signs for last 24 hours: Temp:  [97.3 F (36.3 C)-98.4 F (36.9 C)] 98 F (36.7 C) (05/16 0400) Pulse Rate:  [77-83] 78 (05/16 0500) Resp:  [13-21] 16 (05/16 0600) BP: (99-120)/(53-69) 113/54 (05/16 0600) SpO2:  [97 %-100 %] 98 % (05/16 0500) Weight:  [69.9 kg] 69.9 kg (05/16 0500)  Hemodynamic parameters for last 24 hours:    Intake/Output from previous day: 05/15 0701 - 05/16 0700 In: 1158.9 [I.V.:894.5; IV Piggyback:264.4] Out: 9147 [Urine:2395; Emesis/NG output:775; Drains:280; Stool:225]  Intake/Output this shift: No intake/output data recorded.  Vent settings for last 24 hours:    Physical Exam:  Gen: NAD, appears frail and ill Neuro: follows commands, alert, communicative HEENT: scleral icterus; NG in place draining gastric contents CV: RRR Pulm: nonlabored respirations on room air Abd: soft, nondistended, midline wound with green/yellow fibrinous exudate, fascia in tact. LUQ JP with minimal thin brown fluid. Duodenostomy with minimal thin brown fluid. T tube with bilious output. GU: urine  dark yellow, , +Foley Extr: wwp, no edema Skin: jaundiced  Results for orders placed or performed during the hospital encounter of 01/13/23 (from the past 24 hour(s))  Glucose, capillary     Status: None   Collection Time: 02/19/23  8:18 AM  Result Value Ref Range   Glucose-Capillary 92 70 - 99 mg/dL  Bilirubin, total     Status: Abnormal   Collection Time: 02/19/23  9:03 AM  Result Value Ref Range   Total Bilirubin 31.3 (HH) 0.3 - 1.2 mg/dL  Glucose, capillary     Status: Abnormal   Collection Time: 02/19/23 11:40 AM  Result Value Ref Range   Glucose-Capillary 101 (H) 70 - 99 mg/dL  Hepatic function panel      Status: Abnormal   Collection Time: 02/19/23 12:39 PM  Result Value Ref Range   Total Protein 5.0 (L) 6.5 - 8.1 g/dL   Albumin 2.4 (L) 3.5 - 5.0 g/dL   AST 80 (H) 15 - 41 U/L   ALT 87 (H) 0 - 44 U/L   Alkaline Phosphatase 65 38 - 126 U/L   Total Bilirubin 26.5 (HH) 0.3 - 1.2 mg/dL   Bilirubin, Direct 82.9 (H) 0.0 - 0.2 mg/dL   Indirect Bilirubin 56.2 (H) 0.3 - 0.9 mg/dL  Protime-INR     Status: Abnormal   Collection Time: 02/19/23 12:39 PM  Result Value Ref Range   Prothrombin Time 19.0 (H) 11.4 - 15.2 seconds   INR 1.6 (H) 0.8 - 1.2  APTT     Status: Abnormal   Collection Time: 02/19/23 12:39 PM  Result Value Ref Range   aPTT 41 (H) 24 - 36 seconds  Glucose, capillary     Status: None   Collection Time: 02/19/23  3:34 PM  Result Value Ref Range   Glucose-Capillary 85 70 - 99 mg/dL  Glucose, capillary     Status: None   Collection Time: 02/19/23  7:48 PM  Result Value Ref Range   Glucose-Capillary 87 70 - 99 mg/dL  Glucose, capillary     Status: None   Collection Time: 02/19/23 11:37 PM  Result Value Ref Range   Glucose-Capillary 93 70 - 99 mg/dL  Glucose, capillary     Status: None   Collection Time: 02/20/23  3:56 AM  Result Value Ref Range   Glucose-Capillary 90 70 - 99 mg/dL  CBC     Status: Abnormal   Collection Time: 02/20/23  6:06 AM  Result Value Ref Range   WBC 15.4 (H) 4.0 - 10.5 K/uL   RBC 2.85 (L) 4.22 - 5.81 MIL/uL   Hemoglobin 8.2 (L) 13.0 - 17.0 g/dL   HCT 16.1 (L) 09.6 - 04.5 %   MCV 87.0 80.0 - 100.0 fL   MCH 28.8 26.0 - 34.0 pg   MCHC 33.1 30.0 - 36.0 g/dL   RDW 40.9 (H) 81.1 - 91.4 %   Platelets 154 150 - 400 K/uL   nRBC 0.0 0.0 - 0.2 %  Glucose, capillary     Status: None   Collection Time: 02/20/23  7:30 AM  Result Value Ref Range   Glucose-Capillary 99 70 - 99 mg/dL    Assessment & Plan: The plan of care was discussed with the bedside nurse for the day, who is in agreement with this plan and no additional concerns were raised.   Present  on Admission:  Bowel obstruction (HCC)  Protein-calorie malnutrition, severe (HCC)  Duodenal obstruction    LOS: 38 days   Additional comments:I reviewed the patient's new clinical lab test results.   and I reviewed the patients new imaging test results.    67 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  4/22 - Ex lap, adhesiolysis, duodenojejunal bypass, duodenostomy tube placement, T tube placement in common bile duct. 4/23 - Takeback for bleeding with hemorrhagic shock, resection of ischemic transverse colon, left in discontinuity with Abthera 4/24 - Takeback for bleeding, abdominal washout, ileocecectomy, pack placement, placement of Abthera 4/25 - Takeback, resection of 10cm necrotic distal ileum, washout, removal of packs, placement of Abthera 4/27 - Takeback, placement of J tube, end ileostomy, abdominal closure   - Hyperbilirubinemia: Tube cholangiogram on 5/3 showed patency of the biliary tree with no obstruction. Likely multifactorial due to massive transfusion with hemolysis and shock liver. Tbili seems to have peaked, anticipate will slowly downtrend. Transaminases are near normal and synthetic function is normal. - Keep T tube and D tube to gravity drainage. JP to bulb suction. Drain output decreased with NG decompression. T tube is working. - Pain: prn dilaudid - Agitation/delirium: Improved. Off Seroquel, discontinue klonipin. - ID: Pseudomonas pneumonia, resistant to Zosyn and cefepime. Completed Ciprofloxacin x7d. - Wound care: Saline wet-to-dry to midline wound - FEN: NPO, ok for ice chips. Hold tube feeds given small bowel distension secondary to ileus and likely dysmotility. Resume TPN today. - AKI: Hypovolemia secondary to GI losses. UOP improving with hydration, labs pending this morning. - WOC following for ostomy care and teaching - PT/OT following. Patient is very weak and deconditioned. - VTE: SCDs, lovenox - Dispo: ICU  Sophronia Simas, MD [facility]  Surgery General, Hepatobiliary and Pancreatic Surgery 02/20/23 7:39 AM

## 2023-02-20 NOTE — Progress Notes (Signed)
CRITICAL VALUE STICKER  CRITICAL VALUE: Potassium 2.6  RECEIVER (on-site recipient of call): Elita Quick, RN   DATE & TIME NOTIFIED: 5/16 @ 1055  MESSENGER (representative from lab): see Results Review  MD NOTIFIED: Freida Busman MD   TIME OF NOTIFICATION: 1100  RESPONSE:  See new orders

## 2023-02-21 LAB — GLUCOSE, CAPILLARY
Glucose-Capillary: 106 mg/dL — ABNORMAL HIGH (ref 70–99)
Glucose-Capillary: 115 mg/dL — ABNORMAL HIGH (ref 70–99)
Glucose-Capillary: 119 mg/dL — ABNORMAL HIGH (ref 70–99)
Glucose-Capillary: 127 mg/dL — ABNORMAL HIGH (ref 70–99)
Glucose-Capillary: 134 mg/dL — ABNORMAL HIGH (ref 70–99)
Glucose-Capillary: 137 mg/dL — ABNORMAL HIGH (ref 70–99)
Glucose-Capillary: 98 mg/dL (ref 70–99)

## 2023-02-21 LAB — BASIC METABOLIC PANEL
Anion gap: 10 (ref 5–15)
BUN: 64 mg/dL — ABNORMAL HIGH (ref 8–23)
CO2: 16 mmol/L — ABNORMAL LOW (ref 22–32)
Calcium: 8 mg/dL — ABNORMAL LOW (ref 8.9–10.3)
Chloride: 117 mmol/L — ABNORMAL HIGH (ref 98–111)
Creatinine, Ser: 1.89 mg/dL — ABNORMAL HIGH (ref 0.61–1.24)
GFR, Estimated: 39 mL/min — ABNORMAL LOW (ref >60)
Glucose, Bld: 124 mg/dL — ABNORMAL HIGH (ref 70–99)
Potassium: 3 mmol/L — ABNORMAL LOW (ref 3.5–5.1)
Sodium: 143 mmol/L (ref 135–145)

## 2023-02-21 LAB — PHOSPHORUS: Phosphorus: 4.8 mg/dL — ABNORMAL HIGH (ref 2.5–4.6)

## 2023-02-21 LAB — MAGNESIUM: Magnesium: 2 mg/dL (ref 1.7–2.4)

## 2023-02-21 MED ORDER — POTASSIUM CHLORIDE 10 MEQ/50ML IV SOLN
10.0000 meq | INTRAVENOUS | Status: AC
  Administered 2023-02-21 (×4): 10 meq via INTRAVENOUS
  Filled 2023-02-21 (×4): qty 50

## 2023-02-21 MED ORDER — KCL IN DEXTROSE-NACL 20-5-0.45 MEQ/L-%-% IV SOLN
INTRAVENOUS | Status: AC
  Filled 2023-02-21 (×2): qty 1000

## 2023-02-21 MED ORDER — ZINC CHLORIDE 1 MG/ML IV SOLN
INTRAVENOUS | Status: AC
  Filled 2023-02-21: qty 849.6

## 2023-02-21 NOTE — Progress Notes (Signed)
General Surgery Follow Up Note  Subjective:    Overnight Issues: Tbili 29 yesterday. Creatinine downtrending to 1.8, good UOP. Minimal drain output, T tube working.  Objective:  Vital signs for last 24 hours: Temp:  [97.5 F (36.4 C)-97.9 F (36.6 C)] 97.9 F (36.6 C) (05/17 0000) Pulse Rate:  [76-202] 85 (05/17 0500) Resp:  [14-24] 18 (05/17 0500) BP: (85-122)/(52-74) 118/74 (05/17 0500) SpO2:  [74 %-100 %] 74 % (05/17 0500) Weight:  [64.4 kg] 64.4 kg (05/17 0500)  Hemodynamic parameters for last 24 hours:    Intake/Output from previous day: 05/16 0701 - 05/17 0700 In: 4462.2 [P.O.:200; I.V.:3729.1; IV Piggyback:533.1] Out: 4945 [Urine:3725; Emesis/NG output:700; Drains:270; Stool:250]  Intake/Output this shift: No intake/output data recorded.  Vent settings for last 24 hours:    Physical Exam:  Gen: NAD, appears frail and ill Neuro: follows commands, alert, communicative HEENT: scleral icterus; NG in place draining gastric contents CV: RRR Pulm: nonlabored respirations on room air Abd: soft, nondistended, midline wound with yellow fibrinous exudate, fascia in tact. LUQ JP with minimal turbid brown fluid. Duodenostomy with minimal thin brown fluid. T tube with bilious output. GU: urine  dark yellow, , +Foley Extr: wwp, no edema Skin: jaundiced  Results for orders placed or performed during the hospital encounter of 01/13/23 (from the past 24 hour(s))  CBC     Status: Abnormal   Collection Time: 02/20/23  8:34 AM  Result Value Ref Range   WBC 16.5 (H) 4.0 - 10.5 K/uL   RBC 3.14 (L) 4.22 - 5.81 MIL/uL   Hemoglobin 9.1 (L) 13.0 - 17.0 g/dL   HCT 16.1 (L) 09.6 - 04.5 %   MCV 86.6 80.0 - 100.0 fL   MCH 29.0 26.0 - 34.0 pg   MCHC 33.5 30.0 - 36.0 g/dL   RDW 40.9 (H) 81.1 - 91.4 %   Platelets 178 150 - 400 K/uL   nRBC 0.0 0.0 - 0.2 %  Comprehensive metabolic panel     Status: Abnormal   Collection Time: 02/20/23  8:34 AM  Result Value Ref Range   Sodium 140  135 - 145 mmol/L   Potassium 2.6 (LL) 3.5 - 5.1 mmol/L   Chloride 115 (H) 98 - 111 mmol/L   CO2 15 (L) 22 - 32 mmol/L   Glucose, Bld 95 70 - 99 mg/dL   BUN 64 (H) 8 - 23 mg/dL   Creatinine, Ser 7.82 (H) 0.61 - 1.24 mg/dL   Calcium 7.5 (L) 8.9 - 10.3 mg/dL   Total Protein 5.2 (L) 6.5 - 8.1 g/dL   Albumin 2.0 (L) 3.5 - 5.0 g/dL   AST 956 (H) 15 - 41 U/L   ALT 106 (H) 0 - 44 U/L   Alkaline Phosphatase 89 38 - 126 U/L   Total Bilirubin 29.4 (HH) 0.3 - 1.2 mg/dL   GFR, Estimated 36 (L) >60 mL/min   Anion gap 10 5 - 15  Magnesium     Status: None   Collection Time: 02/20/23  8:34 AM  Result Value Ref Range   Magnesium 2.1 1.7 - 2.4 mg/dL  Phosphorus     Status: Abnormal   Collection Time: 02/20/23  8:34 AM  Result Value Ref Range   Phosphorus 5.7 (H) 2.5 - 4.6 mg/dL  Glucose, capillary     Status: Abnormal   Collection Time: 02/20/23 12:11 PM  Result Value Ref Range   Glucose-Capillary 106 (H) 70 - 99 mg/dL  Glucose, capillary     Status:  Abnormal   Collection Time: 02/20/23  3:31 PM  Result Value Ref Range   Glucose-Capillary 100 (H) 70 - 99 mg/dL  Glucose, capillary     Status: Abnormal   Collection Time: 02/20/23  7:51 PM  Result Value Ref Range   Glucose-Capillary 120 (H) 70 - 99 mg/dL  Glucose, capillary     Status: Abnormal   Collection Time: 02/21/23 12:00 AM  Result Value Ref Range   Glucose-Capillary 119 (H) 70 - 99 mg/dL  Glucose, capillary     Status: Abnormal   Collection Time: 02/21/23  5:00 AM  Result Value Ref Range   Glucose-Capillary 106 (H) 70 - 99 mg/dL  Basic metabolic panel     Status: Abnormal   Collection Time: 02/21/23  5:30 AM  Result Value Ref Range   Sodium 143 135 - 145 mmol/L   Potassium 3.0 (L) 3.5 - 5.1 mmol/L   Chloride 117 (H) 98 - 111 mmol/L   CO2 16 (L) 22 - 32 mmol/L   Glucose, Bld 124 (H) 70 - 99 mg/dL   BUN 64 (H) 8 - 23 mg/dL   Creatinine, Ser 1.61 (H) 0.61 - 1.24 mg/dL   Calcium 8.0 (L) 8.9 - 10.3 mg/dL   GFR, Estimated 39 (L)  >60 mL/min   Anion gap 10 5 - 15  Magnesium     Status: None   Collection Time: 02/21/23  5:30 AM  Result Value Ref Range   Magnesium 2.0 1.7 - 2.4 mg/dL  Phosphorus     Status: Abnormal   Collection Time: 02/21/23  5:30 AM  Result Value Ref Range   Phosphorus 4.8 (H) 2.5 - 4.6 mg/dL    Assessment & Plan: The plan of care was discussed with the bedside nurse for the day, who is in agreement with this plan and no additional concerns were raised.   Present on Admission:  Bowel obstruction (HCC)  Protein-calorie malnutrition, severe (HCC)  Duodenal obstruction    LOS: 39 days   Additional comments:I reviewed the patient's new clinical lab test results.   and I reviewed the patients new imaging test results.    67 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  4/22 - Ex lap, adhesiolysis, duodenojejunal bypass, duodenostomy tube placement, T tube placement in common bile duct. 4/23 - Takeback for bleeding with hemorrhagic shock, resection of ischemic transverse colon, left in discontinuity with Abthera 4/24 - Takeback for bleeding, abdominal washout, ileocecectomy, pack placement, placement of Abthera 4/25 - Takeback, resection of 10cm necrotic distal ileum, washout, removal of packs, placement of Abthera 4/27 - Takeback, placement of J tube, end ileostomy, abdominal closure   - Hyperbilirubinemia: Tube cholangiogram on 5/3 showed patency of the biliary tree with no obstruction. Likely multifactorial due to massive transfusion with hemolysis and shock liver. Tbili seems to have peaked, anticipate will slowly downtrend. Transaminases are near normal and synthetic function is normal. - Keep T tube and D tube to gravity drainage. JP to bulb suction. Drain output decreased with NG decompression. T tube is working. - Pain: prn dilaudid - Agitation/delirium: Improved. Off Seroquel, discontinue klonipin. - ID: Pseudomonas pneumonia, resistant to Zosyn and cefepime. Completed Ciprofloxacin x7d. -  Wound care: Saline wet-to-dry to midline wound - FEN: NPO, ok for ice chips. Hold tube feeds given small bowel distension secondary to ileus and likely dysmotility. Continue TPN. - AKI: Improving. - Hypokalemia: Potassium in TPN, will also add to maintenance fluids this morning. - WOC following for ostomy care and teaching - PT/OT  following. Patient is very weak and deconditioned. - VTE: SCDs, lovenox - Dispo: ICU  Sophronia Simas, MD [facility] Surgery General, Hepatobiliary and Pancreatic Surgery 02/21/23 7:36 AM

## 2023-02-21 NOTE — Progress Notes (Signed)
PHARMACY - TOTAL PARENTERAL NUTRITION CONSULT NOTE  Indication: Prolonged ileus, intolerance to enteral feeding  Patient Measurements: Height: 6\' 1"  (185.4 cm) Weight: 64.4 kg (141 lb 15.6 oz) IBW/kg (Calculated) : 79.9 TPN AdjBW (KG): 65.5 Body mass index is 18.73 kg/m.  Assessment: 89 yom with hx intestinal surgery and subsequent SBO requiring additional resection, admitted 01/13/23 with recurrent issues with SBO at the duodenum. Now s/p duodenal bypass complicated due to adhesions and resulted in small common bile duct injury requiring T-tube placement and external duodenostomy. Patient with massive bleed/hemorrhagic shock, s/p MTP. TPN initiated 4/9-5/6, then transitioned to TF. TF then held 5/14 given small bowel distension secondary to suspected ileus and likely dysmotility. Pharmacy consulted to resume TPN 5/16.  Glucose / Insulin: no hx DM - CBGs controlled and previously well-controlled on TPN with no insulin required Electrolytes: Na 143 (previously none or only small amount in TPN), K 3 post 6 runs (goal >/= 4 for ileus), high CL and low CO2, Phos down to 4.8, others WNL Renal: AKI resolving - SCr down to 1.89 (BL ~0.7-0.9), BUN down to 64 Hepatic: LFTs trend back up - AST 106/ALT 106, Tbili back up to 29.4 (peak ~30, noted scleral icterus), TG WNL, albumin 2 Intake / Output; MIVF: UOP 1.4 ml/kg/hr, NGT output 975 ml, Biliary T-tube 255 ml, LUQ JP drain 20 ml, duodenostomy ; MIVF: D51/2NS at 100 ml/hr GI Imaging:  5/14 CT abd pelvis - feculent material throughout the SB/abdomen, suspicious for SB obstruction, bowel perforation/dehiscence, interval development of gas-containing fluid collection in R paracolic gutter 5/3 CT abdomen: bowel edema, no cause identified for T-bili elevation  5/3 cholangiogram: mild dilation of bilary tract   4/4 UGI: 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 4/23 - Takeback for bleeding with hemorrhagic shock, resection of ischemic transverse colon, left in discontinuity with Abthera 4/24 - Takeback for bleeding, abdominal washout, ileocecectomy, pack placement, placement of Abthera 4/25 - Takeback, resection of 10cm necrotic distal ileum, washout, removal of packs, placement of Abthera 4/27 - Takeback, placement of J tube, end ileostomy, abdominal closure  Central access: triple lumen PICC placed 01/23/23 TPN start date: 01/14/23-02/10/23, 5/16>>  Nutritional Goals: Goal TPN rate is 95 mL/hr (provides 134 g of protein and 2307 kcals per day)  RD Estimated Needs Total Energy Estimated Needs: 2300-2600 Total Protein Estimated Needs: 130-145 grams Total Fluid Estimated Needs: 2.1L/day  Current Nutrition:  TPN  Plan:  Increase TPN to 65 ml/hr at 1800 (goal rate 95 ml/hr).  Monitor for refeeding. TPN provides 85g AA, 202g CHO, 43g ILE and 1457 kCal, meeting ~70% of needs Electrolytes in TPN: Na to 17mEq/L, K 40 mEq/L (based on previous requirements and current AKI), Ca 26mEq/L, Mag 66mEq/L, Phos 27mmol/L since 5/16, max acetate Add standard MVI to TPN.  Remove standard trace elements with elevated Tbili and add back zinc 5mg . Hold selenium and chromium for now with AKI. F/u resumption as renal function returns to baseline. D51/2NS 20K at 60 ml/hr per MD (= 29 mEq/day) KCL x 4 runs D/C free water per tube Monitor TPN labs daily until stable, then standard Mon/Thurs Watch LFT trend - may need to reduce lipid content if trends up further  Adreana Coull D. Laney Potash, PharmD, BCPS, BCCCP 02/21/2023, 8:37 AM

## 2023-02-21 NOTE — Progress Notes (Signed)
Physical Therapy Treatment Patient Details Name: Bradley Dominguez MRN: [id number] DOB: 01/28/1956 Today's Date: 02/21/2023   History of Present Illness 68 yo M HF, chronic partial small bowel obstruction and multiple previous abd surgeries  who was admitted  4/8 from home w n/v/bloating. Underwent EGD 4/11 -- concerning for duodenal obstruction. Developed hemorrhagic shock 4/15 in the setting of hematemesis / hematochezia, decompensated requiring, intubation, embolization of jejunal arcade branch. Extubated 01/22/23. Underwent ex lap adhesiolysis, duodenojejunal bypass, duodenostomy tube placement, T tube placement in common bile duct 4/22, transferred to [facility] 4/23 with hemorrhagic shock, return to OR 4/23, 4/24, 4/25 and finally 4/27 with placement of J tube, end ileostomy, abdominal closure.  Extubated 4/30.  NGT placed 5/14 due to possible ileus.    PT Comments    Patient able to tolerate EOB this pm though earlier refused stating needing to rest right after pain med delivered.  Slow improvements this week after setback and back in ICU, but trending slowly back upward.  Patient with VSS except one instance of RR up to 35 while on EOB so not well tolerated.  SBP drop from  104 in supine to 98 in sitting.  PT will continue to follow and progress as able.  Still hopeful he can tolerated inpatient rehab prior to d/c home.   Recommendations for follow up therapy are one component of a multi-disciplinary discharge planning process, led by the attending physician.  Recommendations may be updated based on patient status, additional functional criteria and insurance authorization.  Follow Up Recommendations       Assistance Recommended at Discharge Frequent or constant Supervision/Assistance  Patient can return home with the following Two people to help with walking and/or transfers;Assistance with cooking/housework;Direct supervision/assist for medications management;Assist for transportation;Help with  stairs or ramp for entrance;Two people to help with bathing/dressing/bathroom   Equipment Recommendations  Other (comment) (TBA)    Recommendations for Other Services       Precautions / Restrictions Precautions Precautions: Fall Precaution Comments: multiple lines, JP drain, biliary tube, ileostomy; abdominal incision; NGT to suction     Mobility  Bed Mobility Overal bed mobility: Needs Assistance Bed Mobility: Rolling, Sidelying to Sit Rolling: Mod assist Sidelying to sit: Mod assist   Sit to supine: Max assist   General bed mobility comments: assist with initiation and to move legs off EOB and to lift trunk upright; assist for legs onto bed pt leaning on elbow to manage trunk then assist to roll back and get his arm out from under him; +2 to scoot to [facility]    Transfers                   General transfer comment: NT    Ambulation/Gait                   Stairs             Wheelchair Mobility    Modified Rankin (Stroke Patients Only)       Balance Overall balance assessment: Needs assistance   Sitting balance-Leahy Scale: Fair Sitting balance - Comments: S for sitting EOB and performing LE therex once placed on EOB                                    Cognition Arousal/Alertness: Awake/alert Behavior During Therapy: Flat affect Overall Cognitive Status: Impaired/Different from baseline  Current Attention Level: Sustained   Following Commands: Follows one step commands with increased time     Problem Solving: Slow processing, Decreased initiation, Requires tactile cues, Requires verbal cues General Comments: very slow to respond to commands and only whispering to communicate        Exercises General Exercises - Lower Extremity Long Arc Quad: AROM, Seated, Both, Other reps (comment) (x 8 reps)    General Comments General comments (skin integrity, edema, etc.): brother in law present then  paster and his spouse arrived to visit      Pertinent Vitals/Pain Pain Assessment Faces Pain Scale: Hurts even more Pain Location: abdomen, generalized Pain Descriptors / Indicators: Discomfort, Grimacing Pain Intervention(s): Monitored during session, Repositioned, Limited activity within patient's tolerance    Home Living                          Prior Function            PT Goals (current goals can now be found in the care plan section) Progress towards PT goals: Progressing toward goals    Frequency    Min 3X/week      PT Plan Current plan remains appropriate    Co-evaluation              AM-PAC PT "6 Clicks" Mobility   Outcome Measure  Help needed turning from your back to your side while in a flat bed without using bedrails?: A Lot Help needed moving from lying on your back to sitting on the side of a flat bed without using bedrails?: A Lot Help needed moving to and from a bed to a chair (including a wheelchair)?: Total Help needed standing up from a chair using your arms (e.g., wheelchair or bedside chair)?: Total Help needed to walk in hospital room?: Total Help needed climbing 3-5 steps with a railing? : Total 6 Click Score: 8    End of Session   Activity Tolerance: Patient limited by fatigue Patient left: in bed;with call bell/phone within reach   PT Visit Diagnosis: Other abnormalities of gait and mobility (R26.89);Muscle weakness (generalized) (M62.81);Other symptoms and signs involving the nervous system (R29.898)     Time: 1191-4782 PT Time Calculation (min) (ACUTE ONLY): 23 min  Charges:  $Therapeutic Activity: 23-37 mins                     Sheran Lawless, PT Acute Rehabilitation Services Office:[phone] 02/21/2023    Bradley Dominguez 02/21/2023, 5:36 PM

## 2023-02-22 DIAGNOSIS — K317 Polyp of stomach and duodenum: Secondary | ICD-10-CM

## 2023-02-22 DIAGNOSIS — K9189 Other postprocedural complications and disorders of digestive system: Secondary | ICD-10-CM | POA: Diagnosis not present

## 2023-02-22 DIAGNOSIS — R17 Unspecified jaundice: Secondary | ICD-10-CM | POA: Diagnosis not present

## 2023-02-22 LAB — CBC
HCT: 27.4 % — ABNORMAL LOW (ref 39.0–52.0)
Hemoglobin: 9.2 g/dL — ABNORMAL LOW (ref 13.0–17.0)
MCH: 28.9 pg (ref 26.0–34.0)
MCHC: 33.6 g/dL (ref 30.0–36.0)
MCV: 86.2 fL (ref 80.0–100.0)
Platelets: 144 10*3/uL — ABNORMAL LOW (ref 150–400)
RBC: 3.18 MIL/uL — ABNORMAL LOW (ref 4.22–5.81)
RDW: 23.8 % — ABNORMAL HIGH (ref 11.5–15.5)
WBC: 26.4 10*3/uL — ABNORMAL HIGH (ref 4.0–10.5)
nRBC: 0 % (ref 0.0–0.2)

## 2023-02-22 LAB — BASIC METABOLIC PANEL
Anion gap: 10 (ref 5–15)
Anion gap: 10 (ref 5–15)
BUN: 59 mg/dL — ABNORMAL HIGH (ref 8–23)
BUN: 66 mg/dL — ABNORMAL HIGH (ref 8–23)
CO2: 18 mmol/L — ABNORMAL LOW (ref 22–32)
CO2: 15 mmol/L — ABNORMAL LOW (ref 22–32)
Calcium: 8.5 mg/dL — ABNORMAL LOW (ref 8.9–10.3)
Calcium: 8.3 mg/dL — ABNORMAL LOW (ref 8.9–10.3)
Chloride: 117 mmol/L — ABNORMAL HIGH (ref 98–111)
Chloride: 117 mmol/L — ABNORMAL HIGH (ref 98–111)
Creatinine, Ser: 1.53 mg/dL — ABNORMAL HIGH (ref 0.61–1.24)
Creatinine, Ser: 1.63 mg/dL — ABNORMAL HIGH (ref 0.61–1.24)
GFR, Estimated: 50 mL/min — ABNORMAL LOW (ref >60)
GFR, Estimated: 46 mL/min — ABNORMAL LOW (ref >60)
Glucose, Bld: 121 mg/dL — ABNORMAL HIGH (ref 70–99)
Glucose, Bld: 134 mg/dL — ABNORMAL HIGH (ref 70–99)
Potassium: 3.5 mmol/L (ref 3.5–5.1)
Potassium: 4.2 mmol/L (ref 3.5–5.1)
Sodium: 145 mmol/L (ref 135–145)
Sodium: 142 mmol/L (ref 135–145)

## 2023-02-22 LAB — MAGNESIUM: Magnesium: 1.9 mg/dL (ref 1.7–2.4)

## 2023-02-22 LAB — GLUCOSE, CAPILLARY
Glucose-Capillary: 108 mg/dL — ABNORMAL HIGH (ref 70–99)
Glucose-Capillary: 127 mg/dL — ABNORMAL HIGH (ref 70–99)
Glucose-Capillary: 107 mg/dL — ABNORMAL HIGH (ref 70–99)
Glucose-Capillary: 114 mg/dL — ABNORMAL HIGH (ref 70–99)

## 2023-02-22 LAB — PHOSPHORUS: Phosphorus: 3.8 mg/dL (ref 2.5–4.6)

## 2023-02-22 MED ORDER — POTASSIUM CHLORIDE 10 MEQ/50ML IV SOLN
10.0000 meq | INTRAVENOUS | Status: AC
  Administered 2023-02-22 (×4): 10 meq via INTRAVENOUS
  Filled 2023-02-22 (×5): qty 50

## 2023-02-22 MED ORDER — ZINC CHLORIDE 1 MG/ML IV SOLN
INTRAVENOUS | Status: AC
  Filled 2023-02-22: qty 1345.2

## 2023-02-22 MED ORDER — LACTATED RINGERS IV BOLUS
1000.0000 mL | Freq: Once | INTRAVENOUS | Status: AC
  Administered 2023-02-22: 1000 mL via INTRAVENOUS

## 2023-02-22 MED ORDER — KCL IN DEXTROSE-NACL 20-5-0.45 MEQ/L-%-% IV SOLN
INTRAVENOUS | Status: AC
  Filled 2023-02-22: qty 1000

## 2023-02-22 MED ORDER — LACTATED RINGERS IV BOLUS
500.0000 mL | Freq: Once | INTRAVENOUS | Status: AC
  Administered 2023-02-22: 500 mL via INTRAVENOUS

## 2023-02-22 NOTE — Progress Notes (Addendum)
eLink Physician-Brief Progress Note Patient Name: Bradley Dominguez DOB: [birth year] MRN: [id number]   Date of Service  02/22/2023  HPI/Events of Note  68M with hx abdominal surgeries with subsequent SBO s/p resection admitted on 01/13/23 with recurrent SBO.  General Surgery in OR requesting evaluation for hypotension. PCCM not formally consulted for this patient yet.  BP 86/55 (65). 1 L IVF ordered. Not completed yet  Labs reviewed. Improving Cr, improving WBC, INR 1.6  eICU Interventions  F/u CBC, currently in process  Advised RN to contact Elink again if patient not responsive. May need formal PCCM consult if not improved    10:56 PM Follow-up camera check. Improved BP to 93/61 (71) after fluid bolus. Stable H/H. Elink available as needed  Intervention Category Minor Interventions: Communication with other healthcare providers and/or family  [name] 02/22/2023, 8:41 PM

## 2023-02-22 NOTE — Progress Notes (Addendum)
General Surgery Follow Up Note  Subjective:    Overnight Issues: Creatinine continues to downtrend, good UOP. Sat on edge of bed with PT yesterday.  Objective:  Vital signs for last 24 hours: Temp:  [97.5 F (36.4 C)-98.4 F (36.9 C)] 97.7 F (36.5 C) (05/18 0400) Pulse Rate:  [56-97] 97 (05/18 0700) Resp:  [13-26] 20 (05/18 0700) BP: (95-118)/(59-70) 118/70 (05/18 0700) SpO2:  [84 %-100 %] 100 % (05/18 0700) Weight:  [64.4 kg] 64.4 kg (05/18 0500)  Hemodynamic parameters for last 24 hours:    Intake/Output from previous day: 05/17 0701 - 05/18 0700 In: 2847.2 [I.V.:2524.7; IV Piggyback:322.5] Out: 5210 [Urine:3545; Emesis/NG output:200; Drains:315; Stool:1150]  Intake/Output this shift: No intake/output data recorded.  Vent settings for last 24 hours:    Physical Exam:  Gen: NAD, appears frail and ill Neuro: follows commands, alert, communicative HEENT: scleral icterus; NG in place draining gastric contents CV: RRR Pulm: nonlabored respirations on room air Abd: soft, nondistended, midline wound with minimal fibrinous exudate, fascia in tact. LUQ JP with minimal turbid brown fluid. Duodenostomy with scant drainage. T tube with bilious output. Ileostomy productive of loose stool. GU: urine  dark yellow, , +Foley Extr: wwp, no edema Skin: jaundiced  Results for orders placed or performed during the hospital encounter of 01/13/23 (from the past 24 hour(s))  Glucose, capillary     Status: Abnormal   Collection Time: 02/21/23  8:37 AM  Result Value Ref Range   Glucose-Capillary 134 (H) 70 - 99 mg/dL  Glucose, capillary     Status: Abnormal   Collection Time: 02/21/23 11:59 AM  Result Value Ref Range   Glucose-Capillary 127 (H) 70 - 99 mg/dL  Glucose, capillary     Status: None   Collection Time: 02/21/23  5:01 PM  Result Value Ref Range   Glucose-Capillary 98 70 - 99 mg/dL  Glucose, capillary     Status: Abnormal   Collection Time: 02/21/23  7:36 PM  Result  Value Ref Range   Glucose-Capillary 115 (H) 70 - 99 mg/dL  Glucose, capillary     Status: Abnormal   Collection Time: 02/21/23 11:36 PM  Result Value Ref Range   Glucose-Capillary 137 (H) 70 - 99 mg/dL  Glucose, capillary     Status: Abnormal   Collection Time: 02/22/23  3:45 AM  Result Value Ref Range   Glucose-Capillary 108 (H) 70 - 99 mg/dL  Basic metabolic panel     Status: Abnormal   Collection Time: 02/22/23  6:30 AM  Result Value Ref Range   Sodium 145 135 - 145 mmol/L   Potassium 3.5 3.5 - 5.1 mmol/L   Chloride 117 (H) 98 - 111 mmol/L   CO2 18 (L) 22 - 32 mmol/L   Glucose, Bld 121 (H) 70 - 99 mg/dL   BUN 59 (H) 8 - 23 mg/dL   Creatinine, Ser 1.61 (H) 0.61 - 1.24 mg/dL   Calcium 8.5 (L) 8.9 - 10.3 mg/dL   GFR, Estimated 50 (L) >60 mL/min   Anion gap 10 5 - 15  Magnesium     Status: None   Collection Time: 02/22/23  6:30 AM  Result Value Ref Range   Magnesium 1.9 1.7 - 2.4 mg/dL  Phosphorus     Status: None   Collection Time: 02/22/23  6:30 AM  Result Value Ref Range   Phosphorus 3.8 2.5 - 4.6 mg/dL    Assessment & Plan: The plan of care was discussed with the bedside nurse for  the day, who is in agreement with this plan and no additional concerns were raised.   Present on Admission:  Bowel obstruction (HCC)  Protein-calorie malnutrition, severe (HCC)  Duodenal obstruction    LOS: 40 days   Additional comments:I reviewed the patient's new clinical lab test results.   and I reviewed the patients new imaging test results.    67 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  4/22 - Ex lap, adhesiolysis, duodenojejunal bypass, duodenostomy tube placement, T tube placement in common bile duct. 4/23 - Takeback for bleeding with hemorrhagic shock, resection of ischemic transverse colon, left in discontinuity with Abthera 4/24 - Takeback for bleeding, abdominal washout, ileocecectomy, pack placement, placement of Abthera 4/25 - Takeback, resection of 10cm necrotic distal ileum,  washout, removal of packs, placement of Abthera 4/27 - Takeback, placement of J tube, end ileostomy, abdominal closure   - Hyperbilirubinemia: Tube cholangiogram on 5/3 showed patency of the biliary tree with no obstruction. Likely multifactorial due to massive transfusion with hemolysis and shock liver. Tbili seems to have peaked, anticipate will slowly downtrend. Transaminases are near normal and synthetic function is normal. Check LFTs twice weekly. - Keep T tube and D tube to gravity drainage. JP to bulb suction. Drain output decreased with NG decompression. T tube is working. D tube output has been very minimal, also minimal JP output. D tube was flushed this morning and is patent. - Pain: prn dilaudid - Agitation/delirium: Improved. - ID: Pseudomonas pneumonia, resistant to Zosyn and cefepime. Completed Ciprofloxacin x7d. - Wound care: Saline wet-to-dry to midline wound - FEN: NPO, ok for ice chips. Hold tube feeds given small bowel distension secondary to ileus and likely dysmotility. Continue full strength TPN. Hypokalemia resolved, bicarb increasing with improvement in AKI. - AKI: Improving. - WOC following for ostomy care and teaching - PT/OT following. Patient is very weak and deconditioned. - Foley: placed for urinary retention and has been present for >1 week. AKI improving. Remove foley today and bladder scan TID, cath as needed for retention. - VTE: SCDs, lovenox - Dispo: ICU  Sophronia Simas, MD [facility] Surgery General, Hepatobiliary and Pancreatic Surgery 02/22/23 8:34 AM

## 2023-02-22 NOTE — Progress Notes (Addendum)
[facility] Progress Note  CC:  Elevated total bili   Subjective:  Wife is at bedside.  Patient is more awake today as they have backed off on some pain meds. He seems to have some abdominal pain Objective:  Vital signs in last 24 hours: Temp:  [97.5 F (36.4 C)-98.4 F (36.9 C)] 97.8 F (36.6 C) (05/18 0800) Pulse Rate:  [56-97] 90 (05/18 0900) Resp:  [13-26] 13 (05/18 0900) BP: (95-118)/(59-70) 106/62 (05/18 0900) SpO2:  [84 %-100 %] 99 % (05/18 0900) Weight:  [64.4 kg] 64.4 kg (05/18 0500) Last BM Date : 02/22/23 General:  Alert, more awake than previously, ill-appearing; deeply jaundiced  Intake/Output from previous day: 05/17 0701 - 05/18 0700 In: 2847.2 [I.V.:2524.7; IV Piggyback:322.5] Out: 5210 [Urine:3545; Emesis/NG output:200; Drains:315; Stool:1150] Intake/Output this shift: Total I/O In: 439.3 [I.V.:384.3; IV Piggyback:55] Out: 100 [Stool:100]  Lab Results: Recent Labs    02/20/23 0606 02/20/23 0834  WBC 15.4* 16.5*  HGB 8.2* 9.1*  HCT 24.8* 27.2*  PLT 154 178   BMET Recent Labs    02/20/23 0834 02/21/23 0530 02/22/23 0630  NA 140 143 145  K 2.6* 3.0* 3.5  CL 115* 117* 117*  CO2 15* 16* 18*  GLUCOSE 95 124* 121*  BUN 64* 64* 59*  CREATININE 2.01* 1.89* 1.53*  CALCIUM 7.5* 8.0* 8.5*   LFT Recent Labs    02/19/23 1239 02/20/23 0834  PROT 5.0* 5.2*  ALBUMIN 2.4* 2.0*  AST 80* 106*  ALT 87* 106*  ALKPHOS 65 89  BILITOT 26.5* 29.4*  BILIDIR 16.0*  --   IBILI 10.5*  --    PT/INR Recent Labs    02/19/23 1239  LABPROT 19.0*  INR 1.6*   Assessment / Plan: *Duodenal obstruction status post duodenal bypass that was complicated due to adhesions and resulted in a small common bile duct injury requiring a T-tube placement and external duodenostomy.  Had a massive bleed and massive transfusion protocol. *Elevated total bili, LFTs: Initially thought to be due to shock liver and maybe hemolysis after massive transfusions.   All of that occurred a few weeks ago now.  Other LFTs are trending down, but total bili remains elevated at 31.3.  He had a cholangio through the T-tube that was patent on 5/3.  Liver Doppler/duplex showed diffuse increased echogenicity of the hepatic parenchyma indicative of hepatocellular dysfunction, most commonly steatosis, but flow all looks good.  Noncontrast CT scan looks okay.  T tube with bilious output.  Does not appear to be an obstructive issue.  No known history of underlying liver disease.  He is not on any medications that should be causing this.  Continue to think that this is multifactorial cholestatic jaundice from critical illness, postoperative bleeding and perhaps other episodes of hypotension that may have caused ischemic hepatopathy, antibiotics and other medicines and TPN.  *Severe malnutrition:  Not able to tolerate enteral feeds.  TNA has been resumed after our consult and input on 5/15.  Needs the TNA for nutrition as long as he is unable to have enteral feeds.  -Once again, continue to check LFTs and PT/INR every few days.  Will likely take weeks-months for the total bili to normalize.  GI will sign off for now.  Please call with any further questions.  Discussed with wife at bedside.     LOS: 40 days   Princella Pellegrini. Zehr  02/22/2023, 11:38 AM  I have taken an interval history, thoroughly reviewed the chart and  examined the patient. I agree with the Advanced Practitioner's note, impression and recommendations, and have recorded additional findings, impressions and recommendations below. I performed a substantive portion of this encounter (>50% time spent), including a complete performance of the medical decision making.  My additional thoughts are as follows:  Clinically stable, renal function slowly improving, nonobstructive multifactorial jaundice will continue for quite some time.  He is back on TPN which is necessary due to his severe protein calorie malnutrition.   Currently unable to take enteral nutrition, further nutritional plans per surgical service.  No bilirubin level today, so we will make sure one is ordered for tomorrow as well as a PT/INR every few days.  I reassured his wife that the bilirubin will likely fluctuate, but as long as he has a stable INR then he is not in hepatic failure.  Our service will sign off for now, please call again as needed.   Charlie Pitter III Office:[phone]

## 2023-02-22 NOTE — Progress Notes (Addendum)
PHARMACY - TOTAL PARENTERAL NUTRITION CONSULT NOTE  Indication: Prolonged ileus, intolerance to enteral feeding  Patient Measurements: Height: 6\' 1"  (185.4 cm) Weight: 64.4 kg (141 lb 15.6 oz) IBW/kg (Calculated) : 79.9 TPN AdjBW (KG): 65.5 Body mass index is 18.73 kg/m.  Assessment: 67 yom with hx intestinal surgery and subsequent SBO requiring additional resection, admitted 01/13/23 with recurrent issues with SBO at the duodenum. Now s/p duodenal bypass complicated due to adhesions and resulted in small common bile duct injury requiring T-tube placement and external duodenostomy. Patient with massive bleed/hemorrhagic shock, s/p MTP. TPN initiated 4/9-5/6, then transitioned to TF. TF then held 5/14 given small bowel distension secondary to suspected ileus and likely dysmotility. Pharmacy consulted to resume TPN 5/16.  Glucose / Insulin: no hx DM - CBGs controlled and previously well-controlled on TPN with no insulin required Electrolytes: Na 145 (previously none or only small amount in TPN), K 3.5 post 6 runs (goal >/= 4 for ileus), high CL and low CO2, Phos down to 3.8, others WNL Renal: AKI resolving - SCr down to 1.53 (BL ~0.7-0.9), BUN down to 59 Hepatic: LFTs trend back up - AST 106/ALT 106, Tbili back up to 29.4 (peak ~30, noted scleral icterus), TG WNL, albumin 2 Intake / Output; MIVF: UOP 2.3 ml/kg/hr, NGT output 200 ml, Biliary T-tube 315 ml, LUQ JP drain 20 ml, duodenostomy ; MIVF: D51/2NS at 60 ml/hr GI Imaging:  5/14 CT abd pelvis - feculent material throughout the SB/abdomen, suspicious for SB obstruction, bowel perforation/dehiscence, interval development of gas-containing fluid collection in R paracolic gutter 5/3 CT abdomen: bowel edema, no cause identified for T-bili elevation  5/3 cholangiogram: mild dilation of bilary tract   4/4 UGI: 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 4/23 - Takeback for bleeding with hemorrhagic shock, resection of ischemic transverse colon, left in discontinuity with Abthera 4/24 - Takeback for bleeding, abdominal washout, ileocecectomy, pack placement, placement of Abthera 4/25 - Takeback, resection of 10cm necrotic distal ileum, washout, removal of packs, placement of Abthera 4/27 - Takeback, placement of J tube, end ileostomy, abdominal closure  Central access: triple lumen PICC placed 01/23/23 TPN start date: 01/14/23-02/10/23, 5/16>>  Nutritional Goals: Goal TPN rate is 95 mL/hr (provides 134 g of protein and 2307 kcals per day)  RD Estimated Needs Total Energy Estimated Needs: 2300-2600 Total Protein Estimated Needs: 130-145 grams Total Fluid Estimated Needs: 2.1L/day  Current Nutrition:  TPN  Plan:  Increase TPN to 95 ml/hr at 1800 (goal rate 95 ml/hr).  Monitor for refeeding. TPN provides 85g AA, 202g CHO, 43g ILE and 1457 kCal, meeting ~70% of needs Electrolytes in TPN: Na to 33mEq/L, K 40 mEq/L (based on previous requirements and current AKI), Ca 67mEq/L, Mag 61mEq/L, Phos 87mmol/L since 5/16, max acetate Add standard MVI to TPN.  Remove standard trace elements with elevated Tbili and add back zinc 5mg . Hold selenium and chromium for now with AKI. F/u resumption as renal function returns to baseline. Decrease D51/2NS 20K to 30 ml/hr at 1800 KCL x 4 runs Monitor TPN labs daily until stable, then standard Mon/Thurs Watch LFT trend - may need to reduce lipid content if trends up further  Jeanella Cara, PharmD, [facility] Clinical Pharmacist Please see AMION for all Pharmacists' Contact Phone Numbers 02/22/2023, 9:04 AM

## 2023-02-23 DIAGNOSIS — K922 Gastrointestinal hemorrhage, unspecified: Secondary | ICD-10-CM

## 2023-02-23 DIAGNOSIS — E43 Unspecified severe protein-calorie malnutrition: Secondary | ICD-10-CM | POA: Diagnosis not present

## 2023-02-23 DIAGNOSIS — K315 Obstruction of duodenum: Secondary | ICD-10-CM | POA: Diagnosis not present

## 2023-02-23 LAB — BASIC METABOLIC PANEL
Anion gap: 11 (ref 5–15)
BUN: 73 mg/dL — ABNORMAL HIGH (ref 8–23)
CO2: 14 mmol/L — ABNORMAL LOW (ref 22–32)
Calcium: 8.1 mg/dL — ABNORMAL LOW (ref 8.9–10.3)
Chloride: 116 mmol/L — ABNORMAL HIGH (ref 98–111)
Creatinine, Ser: 1.56 mg/dL — ABNORMAL HIGH (ref 0.61–1.24)
GFR, Estimated: 49 mL/min — ABNORMAL LOW (ref >60)
Glucose, Bld: 126 mg/dL — ABNORMAL HIGH (ref 70–99)
Potassium: 3.8 mmol/L (ref 3.5–5.1)
Sodium: 141 mmol/L (ref 135–145)

## 2023-02-23 LAB — HEMOGLOBIN AND HEMATOCRIT, BLOOD
HCT: 20.7 % — ABNORMAL LOW (ref 39.0–52.0)
HCT: 26 % — ABNORMAL LOW (ref 39.0–52.0)
Hemoglobin: 6.8 g/dL — CL (ref 13.0–17.0)
Hemoglobin: 8.7 g/dL — ABNORMAL LOW (ref 13.0–17.0)

## 2023-02-23 LAB — TYPE AND SCREEN
ABO/RH(D): O NEG
Unit division: 0

## 2023-02-23 LAB — GLUCOSE, CAPILLARY
Glucose-Capillary: 108 mg/dL — ABNORMAL HIGH (ref 70–99)
Glucose-Capillary: 116 mg/dL — ABNORMAL HIGH (ref 70–99)
Glucose-Capillary: 125 mg/dL — ABNORMAL HIGH (ref 70–99)
Glucose-Capillary: 91 mg/dL (ref 70–99)
Glucose-Capillary: 97 mg/dL (ref 70–99)

## 2023-02-23 LAB — BPAM RBC: ISSUE DATE / TIME: 202405191440

## 2023-02-23 LAB — MAGNESIUM: Magnesium: 1.8 mg/dL (ref 1.7–2.4)

## 2023-02-23 LAB — CBC
HCT: 25.7 % — ABNORMAL LOW (ref 39.0–52.0)
Hemoglobin: 8.7 g/dL — ABNORMAL LOW (ref 13.0–17.0)
MCH: 29.5 pg (ref 26.0–34.0)
MCHC: 33.9 g/dL (ref 30.0–36.0)
MCV: 87.1 fL (ref 80.0–100.0)
Platelets: 137 10*3/uL — ABNORMAL LOW (ref 150–400)
RBC: 2.95 MIL/uL — ABNORMAL LOW (ref 4.22–5.81)
RDW: 23.5 % — ABNORMAL HIGH (ref 11.5–15.5)
WBC: 23.3 10*3/uL — ABNORMAL HIGH (ref 4.0–10.5)
nRBC: 0 % (ref 0.0–0.2)

## 2023-02-23 LAB — PREPARE RBC (CROSSMATCH)

## 2023-02-23 LAB — PHOSPHORUS: Phosphorus: 2.7 mg/dL (ref 2.5–4.6)

## 2023-02-23 MED ORDER — ZINC CHLORIDE 1 MG/ML IV SOLN
INTRAVENOUS | Status: AC
  Filled 2023-02-23: qty 1345.2

## 2023-02-23 MED ORDER — ALBUMIN HUMAN 5 % IV SOLN
25.0000 g | Freq: Once | INTRAVENOUS | Status: AC
  Administered 2023-02-23: 25 g via INTRAVENOUS
  Filled 2023-02-23: qty 500

## 2023-02-23 MED ORDER — SODIUM CHLORIDE 0.9% IV SOLUTION: Freq: Once | INTRAVENOUS | Status: AC

## 2023-02-23 NOTE — Progress Notes (Signed)
General Surgery Follow Up Note  Subjective:    Overnight Issues: BP has been soft, SBP 80s-90s. Good UOP, creatinine continues to downtrend. Ostomy output noted to be slightly bloody, hgb stable.  Objective:  Vital signs for last 24 hours: Temp:  [97.5 F (36.4 C)-99.2 F (37.3 C)] 97.5 F (36.4 C) (05/19 0800) Pulse Rate:  [87-104] 88 (05/19 1000) Resp:  [13-25] 17 (05/19 1000) BP: (73-99)/(39-64) 79/44 (05/19 1000) SpO2:  [97 %-100 %] 99 % (05/19 1000) Weight:  [65 kg] 65 kg (05/19 0500)  Hemodynamic parameters for last 24 hours:    Intake/Output from previous day: 05/18 0701 - 05/19 0700 In: 5054.8 [I.V.:3173.8; IV Piggyback:1880.9] Out: 4230 [Urine:2650; Emesis/NG output:5; Drains:1000; Stool:575]  Intake/Output this shift: Total I/O In: 2605.6 [I.V.:375.2; NG/GT:2230; IV Piggyback:0.3] Out: 1210 [Emesis/NG output:550; Drains:60; Stool:600]  Vent settings for last 24 hours:    Physical Exam:  Gen: NAD, appears frail and ill Neuro: follows commands, alert, communicative HEENT: scleral icterus; NG in place draining nonbloody gastric contents. CV: RRR Pulm: nonlabored respirations on room air Abd: soft, nondistended, midline wound with minimal fibrinous exudate, fascia in tact. LUQ JP with minimal turbid brown fluid. Duodenostomy with scant drainage. T tube with bilious, more sediment today. Ileostomy productive of stool, slightly darker than previously, slight blood tinge. Extr: wwp, no edema Skin: jaundiced  Results for orders placed or performed during the hospital encounter of 01/13/23 (from the past 24 hour(s))  Glucose, capillary     Status: Abnormal   Collection Time: 02/22/23 11:47 AM  Result Value Ref Range   Glucose-Capillary 107 (H) 70 - 99 mg/dL  Glucose, capillary     Status: Abnormal   Collection Time: 02/22/23  3:43 PM  Result Value Ref Range   Glucose-Capillary 114 (H) 70 - 99 mg/dL  Basic metabolic panel     Status: Abnormal   Collection  Time: 02/22/23  8:41 PM  Result Value Ref Range   Sodium 142 135 - 145 mmol/L   Potassium 4.2 3.5 - 5.1 mmol/L   Chloride 117 (H) 98 - 111 mmol/L   CO2 15 (L) 22 - 32 mmol/L   Glucose, Bld 134 (H) 70 - 99 mg/dL   BUN 66 (H) 8 - 23 mg/dL   Creatinine, Ser 1.61 (H) 0.61 - 1.24 mg/dL   Calcium 8.3 (L) 8.9 - 10.3 mg/dL   GFR, Estimated 46 (L) >60 mL/min   Anion gap 10 5 - 15  CBC     Status: Abnormal   Collection Time: 02/22/23  8:41 PM  Result Value Ref Range   WBC 26.4 (H) 4.0 - 10.5 K/uL   RBC 3.18 (L) 4.22 - 5.81 MIL/uL   Hemoglobin 9.2 (L) 13.0 - 17.0 g/dL   HCT 09.6 (L) 04.5 - 40.9 %   MCV 86.2 80.0 - 100.0 fL   MCH 28.9 26.0 - 34.0 pg   MCHC 33.6 30.0 - 36.0 g/dL   RDW 81.1 (H) 91.4 - 78.2 %   Platelets 144 (L) 150 - 400 K/uL   nRBC 0.0 0.0 - 0.2 %  CBC     Status: Abnormal   Collection Time: 02/23/23  5:13 AM  Result Value Ref Range   WBC 23.3 (H) 4.0 - 10.5 K/uL   RBC 2.95 (L) 4.22 - 5.81 MIL/uL   Hemoglobin 8.7 (L) 13.0 - 17.0 g/dL   HCT 95.6 (L) 21.3 - 08.6 %   MCV 87.1 80.0 - 100.0 fL   MCH 29.5 26.0 -  34.0 pg   MCHC 33.9 30.0 - 36.0 g/dL   RDW 40.9 (H) 81.1 - 91.4 %   Platelets 137 (L) 150 - 400 K/uL   nRBC 0.0 0.0 - 0.2 %  Basic metabolic panel     Status: Abnormal   Collection Time: 02/23/23  5:13 AM  Result Value Ref Range   Sodium 141 135 - 145 mmol/L   Potassium 3.8 3.5 - 5.1 mmol/L   Chloride 116 (H) 98 - 111 mmol/L   CO2 14 (L) 22 - 32 mmol/L   Glucose, Bld 126 (H) 70 - 99 mg/dL   BUN 73 (H) 8 - 23 mg/dL   Creatinine, Ser 7.82 (H) 0.61 - 1.24 mg/dL   Calcium 8.1 (L) 8.9 - 10.3 mg/dL   GFR, Estimated 49 (L) >60 mL/min   Anion gap 11 5 - 15  Magnesium     Status: None   Collection Time: 02/23/23  5:13 AM  Result Value Ref Range   Magnesium 1.8 1.7 - 2.4 mg/dL  Phosphorus     Status: None   Collection Time: 02/23/23  5:13 AM  Result Value Ref Range   Phosphorus 2.7 2.5 - 4.6 mg/dL  Glucose, capillary     Status: Abnormal   Collection Time:  02/23/23  8:05 AM  Result Value Ref Range   Glucose-Capillary 116 (H) 70 - 99 mg/dL    Assessment & Plan: The plan of care was discussed with the bedside nurse for the day, who is in agreement with this plan and no additional concerns were raised.   Present on Admission:  Bowel obstruction (HCC)  Protein-calorie malnutrition, severe (HCC)  Duodenal obstruction    LOS: 41 days   Additional comments:I reviewed the patient's new clinical lab test results.   and I reviewed the patients new imaging test results.    67 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  4/22 - Ex lap, adhesiolysis, duodenojejunal bypass, duodenostomy tube placement, T tube placement in common bile duct. 4/23 - Takeback for bleeding with hemorrhagic shock, resection of ischemic transverse colon, left in discontinuity with Abthera 4/24 - Takeback for bleeding, abdominal washout, ileocecectomy, pack placement, placement of Abthera 4/25 - Takeback, resection of 10cm necrotic distal ileum, washout, removal of packs, placement of Abthera 4/27 - Takeback, placement of J tube, end ileostomy, abdominal closure   - Hyperbilirubinemia: Tube cholangiogram on 5/3 showed patency of the biliary tree with no obstruction. Likely multifactorial due to massive transfusion with hemolysis and shock liver. Tbili seems to have peaked, anticipate will slowly downtrend. Transaminases are near normal and synthetic function is normal. Check LFTs twice weekly. - Keep T tube and D tube to gravity drainage. JP to bulb suction. Drain output decreased with NG decompression. T tube is working. D tube output has been very minimal, also minimal JP output. - Pain: prn dilaudid - Agitation/delirium: Improved. - ID: Pseudomonas pneumonia, resistant to Zosyn and cefepime. Completed Ciprofloxacin x7d. - Wound care: Saline wet-to-dry to midline wound - FEN: NPO, ok for ice chips. Hold tube feeds given small bowel distension secondary to ileus and likely  dysmotility. Continue full strength TPN.  - AKI: Improving. - WOC following for ostomy care and teaching - PT/OT following. Patient is very weak and deconditioned. - Foley removed, in and out cath prn for retention. - Blood in ileostomy: hgb stable, monitor closely. - Soft BP: patient's baseline blood pressure is low, and he continues to have good UOP. Will give albumin today. - VTE: SCDs, hold  lovenox given blood tinge in ileostomy - Dispo: ICU  Sophronia Simas, MD [facility] Surgery General, Hepatobiliary and Pancreatic Surgery 02/23/23 10:31 AM

## 2023-02-23 NOTE — Progress Notes (Addendum)
[facility] Progress Note  CC:  GI bleed  Subjective: Reconsulted by Dr. Freida Busman this time for suspected recurrent GI bleeding.  BPs have been soft.  Hemoglobin this morning 6.8 g down from 9.2 g yesterday.  Going to receive 2 units packed red blood cells.  Appears to have stool in the NGT that is brown in color, not black or bloody.  Stool in the ostomy is maroon.  No family at bedside today.  History from consult note: Had intestinal surgery as teenager. 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 Following that patient had small bowel obstructions requiring additional resection. No problems and the last 40 years or more.   Objective:  Vital signs in last 24 hours: Temp:  [97.5 F (36.4 C)-99.2 F (37.3 C)] 97.5 F (36.4 C) (05/19 0800) Pulse Rate:  [84-104] 86 (05/19 1300) Resp:  [13-28] 16 (05/19 1300) BP: (56-98)/(34-64) 89/55 (05/19 1300) SpO2:  [97 %-100 %] 100 % (05/19 1300) Weight:  [65 kg] 65 kg (05/19 0500) Last BM Date : 02/23/23 General:  Alert, acutely ill-appearing; deeply  Heart:  Regular rate and rhythm; no murmurs Abdomen:  Soft, midline dressing noted.  LUQ JP drain noted.  Duodenostomy with dark, maroon stool. T tube with bilious output.  Extremities:  Without edema.  Intake/Output from previous day: 05/18 0701 - 05/19 0700 In: 5054.8 [I.V.:3173.8; IV Piggyback:1880.9] Out: 4230 [Urine:2650; Emesis/NG output:5; Drains:1000; Stool:575] Intake/Output this shift: Total I/O In: 3105.6 [I.V.:875.3; NG/GT:2230; IV Piggyback:0.3] Out: 2310 [Urine:900; Emesis/NG output:700; Drains:110; Stool:600]  Lab Results: Recent Labs    02/22/23 2041 02/23/23 0513 02/23/23 1201  WBC 26.4* 23.3*  --   HGB 9.2* 8.7* 6.8*  HCT 27.4* 25.7* 20.7*  PLT 144* 137*  --    BMET Recent Labs    02/22/23 0630 02/22/23 2041 02/23/23 0513  NA 145 142 141  K 3.5 4.2 3.8  CL 117*  117* 116*  CO2 18* 15* 14*  GLUCOSE 121* 134* 126*  BUN 59* 66* 73*  CREATININE 1.53* 1.63* 1.56*  CALCIUM 8.5* 8.3* 8.1*   Assessment / Plan: *GI bleed:  Hgb 6.8 grams down from 9.2 grams yesterday.  BPs soft.  Going to receive 2 units of packed red blood cells. EGD 01/19/2023: - Oozing gastric ulcer with oozing hemorrhage (Forrest Class Ib). Injected/cips x 2/pureStat application.  Patient appears to have feculent material via NG tube that is brown in color.  Stool in ostomy is deep maroon.  Suspect that bleeding source is passed the stomach where his previous ulcer was seen.  *Duodenal obstruction status post duodenal bypass that was complicated due to adhesions and resulted in a small common bile duct injury requiring a T-tube placement and external duodenostomy.  Had a massive bleed and massive transfusion protocol (taken back to the OR multiple times).  Most recent CT scan from 5/14 shows intraperitoneal free air with multiple additional small foci of extraluminal gas and feculent extraluminal material suspicious for bowel perforation/dehiscence.  *Elevated total bili, LFTs: Initially thought to be due to shock liver and maybe hemolysis after massive transfusions.  All of that occurred a few weeks ago now.  Other LFTs are trending down, but total bili remains elevated at 31.3.  He had a cholangio through the T-tube that was patent on 5/3.  Liver Doppler/duplex showed diffuse increased echogenicity of the hepatic parenchyma indicative of hepatocellular dysfunction, most commonly  steatosis, but flow all looks good.  Noncontrast CT scan looks okay.  T tube with bilious output.  Does not appear to be an obstructive issue.  No known history of underlying liver disease.  He is not on any medications that should be causing this.  Continue to think that this is multifactorial cholestatic jaundice from critical illness, postoperative bleeding and perhaps other episodes of hypotension that may have caused  ischemic hepatopathy, antibiotics and other medicines and TPN.   *Severe malnutrition:  Not able to tolerate enteral feeds.  TNA has been resumed after our consult and input on 5/15.  Needs the TNA for nutrition as long as he is unable to have enteral feeds.   -Unfortunately in this setting options for bleeding are going to be limited.  Had a bleeding gastric ulcer that was intervened upon as above earlier this admission on EGD, but most recent CT scan likely shows a bowel perforation/leak, making it high risk to consider re-scope.  Also, suspect that bleeding source is passed the stomach where his previous ulcer was seen.  He has been on IV PPI twice daily so would continue that.  Monitor blood counts and transfuse as needed.  No plans for endoscopic procedure from a GI standpoint. -Once again, in regards to the total bili, continue to check LFTs and PT/INR every few days.  Will likely take weeks-months for the total bili to normalize.    LOS: 41 days   Princella Pellegrini. Zehr  02/23/2023, 2:32 PM  GI ATTENDING  Interval history and data reviewed.  Reconsulted regarding blood per ostomy.  Extraordinarily complicated case.  Agree with progress note as outlined above.  Multiple issues.  In terms of GI bleeding fortunately, no role for endoscopy.  As well, not much to add to your current level of supportive care.  Please reach out if there are other issues that you feel might be of aid.  Wilhemina Bonito. Eda Keys., M.D. [facility] Division of Gastroenterology

## 2023-02-23 NOTE — Progress Notes (Signed)
Rounded with Dr. Freida Busman.  Blood pressure goal verified.  MAP of 60 is acceptable and PRN pain medication to be given as needed.  Patient's spouse updated during rounds.

## 2023-02-23 NOTE — Progress Notes (Addendum)
[facility] MD on call notified for low BP @2000   1L LR bolus given, labs drawn [facility] MD on call notified @2300  for T Tube drain color change (dark red/brown in color) and increased output ( output compared to output on dayshift). BP still low  No new orders at this time

## 2023-02-23 NOTE — Progress Notes (Signed)
PHARMACY - TOTAL PARENTERAL NUTRITION CONSULT NOTE  Indication: Prolonged ileus, intolerance to enteral feeding  Patient Measurements: Height: 6\' 1"  (185.4 cm) Weight: 65 kg (143 lb 4.8 oz) IBW/kg (Calculated) : 79.9 TPN AdjBW (KG): 65.5 Body mass index is 18.91 kg/m.  Assessment: 39 yom with hx intestinal surgery and subsequent SBO requiring additional resection, admitted 01/13/23 with recurrent issues with SBO at the duodenum. Now s/p duodenal bypass complicated due to adhesions and resulted in small common bile duct injury requiring T-tube placement and external duodenostomy. Patient with massive bleed/hemorrhagic shock, s/p MTP. TPN initiated 4/9-5/6, then transitioned to TF. TF then held 5/14 given small bowel distension secondary to suspected ileus and likely dysmotility. Pharmacy consulted to resume TPN 5/16.  Glucose / Insulin: no hx DM - CBGs controlled and previously well-controlled on TPN with no insulin required Electrolytes: Na 141 (previously none or only small amount in TPN), K 3.8 post 6 runs (goal >/= 4 for ileus), high CL and low CO2, Phos down to 2.7, Mag 1.8, others WNL Renal: AKI resolving - SCr 1.56 (BL ~0.7-0.9), BUN 73 Hepatic: LFTs trend back up - AST 106/ALT 106, Tbili back up to 29.4 (peak ~30, noted scleral icterus), TG WNL, albumin 2 Intake / Output; MIVF: UOP 1.7 ml/kg/hr, NGT output 5 ml, Biliary T-tube 1000 ml, LUQ JP drain 20 ml, duodenostomy ; MIVF: D51/2NS at 30 ml/hr GI Imaging:  5/14 CT abd pelvis - feculent material throughout the SB/abdomen, suspicious for SB obstruction, bowel perforation/dehiscence, interval development of gas-containing fluid collection in R paracolic gutter 5/3 CT abdomen: bowel edema, no cause identified for T-bili elevation  5/3 cholangiogram: mild dilation of bilary tract   4/4 UGI: Findings suggestive of chronic pSBO or ileus 4/9 CT: Ileus vs high grade partial SBO GI Surgeries / Procedures: 4/22 - Ex lap, adhesiolysis,  duodenojejunal bypass, duodenostomy tube placement, T tube placement in common bile duct. 4/23 - Takeback for bleeding with hemorrhagic shock, resection of ischemic transverse colon, left in discontinuity with Abthera 4/24 - Takeback for bleeding, abdominal washout, ileocecectomy, pack placement, placement of Abthera 4/25 - Takeback, resection of 10cm necrotic distal ileum, washout, removal of packs, placement of Abthera 4/27 - Takeback, placement of J tube, end ileostomy, abdominal closure  Central access: triple lumen PICC placed 01/23/23 TPN start date: 01/14/23-02/10/23, 5/16>>  Nutritional Goals: Goal TPN rate is 95 mL/hr (provides 134 g of protein and 2307 kcals per day)  RD Estimated Needs Total Energy Estimated Needs: 2300-2600 Total Protein Estimated Needs: 130-145 grams Total Fluid Estimated Needs: 2.1L/day  Current Nutrition:  TPN  Plan:  Continue TPN at 95 ml/hr at 1800 (goal rate 95 ml/hr).  Monitor for refeeding. TPN provides 85g AA, 202g CHO, 43g ILE and 1457 kCal, meeting ~70% of needs Electrolytes in TPN: Na to 3mEq/L, K 40 mEq/L (based on previous requirements and current AKI), Ca 66mEq/L, Mag 53mEq/L, Phos 21mmol/L since 5/16, max acetate Add standard MVI to TPN.  Remove standard trace elements with elevated Tbili and add back zinc 5mg . Hold selenium and chromium for now with AKI. F/u resumption as renal function returns to baseline. D/c D51/2NS at 1800 Monitor TPN labs standard Mon/Thurs Watch LFT trend - may need to reduce lipid content if trends up further  Jeanella Cara, PharmD, [facility] Clinical Pharmacist Please see AMION for all Pharmacists' Contact Phone Numbers 02/23/2023, 8:01 AM

## 2023-02-23 NOTE — Progress Notes (Signed)
BP remains low, repeat hgb/hct shows hgb of 6.8. STAT type and cross ordered, will transfuse 2u PRBCs.

## 2023-02-24 LAB — COMPREHENSIVE METABOLIC PANEL
ALT: 128 U/L — ABNORMAL HIGH (ref 0–44)
AST: 144 U/L — ABNORMAL HIGH (ref 15–41)
Albumin: 2.1 g/dL — ABNORMAL LOW (ref 3.5–5.0)
Alkaline Phosphatase: 106 U/L (ref 38–126)
Anion gap: 8 (ref 5–15)
BUN: 88 mg/dL — ABNORMAL HIGH (ref 8–23)
CO2: 16 mmol/L — ABNORMAL LOW (ref 22–32)
Calcium: 8.6 mg/dL — ABNORMAL LOW (ref 8.9–10.3)
Chloride: 117 mmol/L — ABNORMAL HIGH (ref 98–111)
Creatinine, Ser: 1.53 mg/dL — ABNORMAL HIGH (ref 0.61–1.24)
GFR, Estimated: 50 mL/min — ABNORMAL LOW (ref >60)
Glucose, Bld: 111 mg/dL — ABNORMAL HIGH (ref 70–99)
Potassium: 4.2 mmol/L (ref 3.5–5.1)
Sodium: 141 mmol/L (ref 135–145)
Total Bilirubin: 26.2 mg/dL (ref 0.3–1.2)
Total Protein: 6 g/dL — ABNORMAL LOW (ref 6.5–8.1)

## 2023-02-24 LAB — CBC
HCT: 28.5 % — ABNORMAL LOW (ref 39.0–52.0)
Hemoglobin: 10 g/dL — ABNORMAL LOW (ref 13.0–17.0)
MCH: 29.1 pg (ref 26.0–34.0)
MCHC: 35.1 g/dL (ref 30.0–36.0)
MCV: 82.8 fL (ref 80.0–100.0)
Platelets: 155 10*3/uL (ref 150–400)
RBC: 3.44 MIL/uL — ABNORMAL LOW (ref 4.22–5.81)
RDW: 22.4 % — ABNORMAL HIGH (ref 11.5–15.5)
WBC: 21.7 10*3/uL — ABNORMAL HIGH (ref 4.0–10.5)
nRBC: 0 % (ref 0.0–0.2)

## 2023-02-24 LAB — TYPE AND SCREEN
Antibody Screen: NEGATIVE
Unit division: 0

## 2023-02-24 LAB — GLUCOSE, CAPILLARY
Glucose-Capillary: 101 mg/dL — ABNORMAL HIGH (ref 70–99)
Glucose-Capillary: 108 mg/dL — ABNORMAL HIGH (ref 70–99)
Glucose-Capillary: 80 mg/dL (ref 70–99)
Glucose-Capillary: 93 mg/dL (ref 70–99)
Glucose-Capillary: 96 mg/dL (ref 70–99)
Glucose-Capillary: 99 mg/dL (ref 70–99)

## 2023-02-24 LAB — TRIGLYCERIDES: Triglycerides: 130 mg/dL (ref <150)

## 2023-02-24 LAB — BPAM RBC
Blood Product Expiration Date: 202405272359
Blood Product Expiration Date: 202405272359
Unit Type and Rh: 9500

## 2023-02-24 LAB — HEMOGLOBIN AND HEMATOCRIT, BLOOD
HCT: 27.7 % — ABNORMAL LOW (ref 39.0–52.0)
HCT: 27.7 % — ABNORMAL LOW (ref 39.0–52.0)
Hemoglobin: 9.5 g/dL — ABNORMAL LOW (ref 13.0–17.0)
Hemoglobin: 9.6 g/dL — ABNORMAL LOW (ref 13.0–17.0)

## 2023-02-24 LAB — PHOSPHORUS: Phosphorus: 3.2 mg/dL (ref 2.5–4.6)

## 2023-02-24 LAB — MAGNESIUM: Magnesium: 1.7 mg/dL (ref 1.7–2.4)

## 2023-02-24 MED ORDER — METHOCARBAMOL 1000 MG/10ML IJ SOLN
500.0000 mg | Freq: Three times a day (TID) | INTRAVENOUS | Status: DC | PRN
  Administered 2023-02-25 – 2023-03-07 (×17): 500 mg via INTRAVENOUS
  Filled 2023-02-24: qty 5
  Filled 2023-02-24 (×2): qty 500
  Filled 2023-02-24: qty 5
  Filled 2023-02-24 (×8): qty 500
  Filled 2023-02-24: qty 5
  Filled 2023-02-24 (×2): qty 500
  Filled 2023-02-24 (×3): qty 5

## 2023-02-24 MED ORDER — ZINC CHLORIDE 1 MG/ML IV SOLN
INTRAVENOUS | Status: AC
  Filled 2023-02-24: qty 1345.2

## 2023-02-24 NOTE — Progress Notes (Signed)
Occupational Therapy Treatment Patient Details Name: Bradley Dominguez MRN: [id number] DOB: [birth year] Today's Date: 02/24/2023   History of present illness 67 yo M HF, chronic partial small bowel obstruction and multiple previous abd surgeries  who was admitted  4/8 from home w n/v/bloating. Underwent EGD 4/11 -- concerning for duodenal obstruction. Developed hemorrhagic shock 4/15 in the setting of hematemesis / hematochezia, decompensated requiring, intubation, embolization of jejunal arcade branch. Extubated 01/22/23. Underwent ex lap adhesiolysis, duodenojejunal bypass, duodenostomy tube placement, T tube placement in common bile duct 4/22, transferred to [facility] 4/23 with hemorrhagic shock, return to OR 4/23, 4/24, 4/25 and finally 4/27 with placement of J tube, end ileostomy, abdominal closure.  Extubated 4/30.  NGT placed 5/14 due to possible ileus.   OT comments  Session focus on increasing activity tolerance, patient able to sit EOB for less than five minutes due to pain and fatigue. Patient then completing 2 squat pivots to [facility] with OT and PT providing max A of 2. Patient then using level one theraband to complete 5 reps with L arm for horizontal shoulder abduction and 10 reps with R arm with max encouragement needed. OT will continue to follow, discharge remains appropriate.    Recommendations for follow up therapy are one component of a multi-disciplinary discharge planning process, led by the attending physician.  Recommendations may be updated based on patient status, additional functional criteria and insurance authorization.    Assistance Recommended at Discharge Frequent or constant Supervision/Assistance  Patient can return home with the following  A little help with walking and/or transfers;A little help with bathing/dressing/bathroom;Assistance with cooking/housework;Assist for transportation;Help with stairs or ramp for entrance;Direct supervision/assist for medications management    Equipment Recommendations  Other (comment) (defer to next venue)    Recommendations for Other Services      Precautions / Restrictions Precautions Precautions: Fall Precaution Comments: multiple lines, JP drain, biliary tube, ileostomy; abdominal incision; NGT to suction Restrictions Weight Bearing Restrictions: No       Mobility Bed Mobility Overal bed mobility: Needs Assistance Bed Mobility: Rolling, Sidelying to Sit, Sit to Sidelying Rolling: Mod assist, +2 for physical assistance, +2 for safety/equipment Sidelying to sit: Mod assist, +2 for physical assistance, +2 for safety/equipment Supine to sit: Mod assist, +2 for physical assistance     General bed mobility comments: +2 assist with initiation and to move legs off EOB and to lift trunk upright; assist for legs onto bed pt leaning on elbow to manage trunk then assist to roll back and get his arm out from under him; +2 to scoot (incremental squat pivots) to [facility]    Transfers Overall transfer level: Needs assistance                 General transfer comment: unable to come fully into standing due to fatigue     Balance Overall balance assessment: Needs assistance Sitting-balance support: Feet supported, Bilateral upper extremity supported Sitting balance-Leahy Scale: Fair                                     ADL either performed or assessed with clinical judgement   ADL Overall ADL's : Needs assistance/impaired Eating/Feeding: NPO (ice chips)   Grooming: Minimal assistance                   Toilet Transfer: Maximal assistance;+2 for physical assistance;+2 for safety/equipment;Squat-pivot Toilet Transfer Details (indicate cue type and  reason): able to complete 2 incremental squat pivots up to [facility], max cues needed for sequencing Toileting- Clothing Manipulation and Hygiene: Total assistance       Functional mobility during ADLs: Maximal assistance;+2 for physical assistance;+2 for  safety/equipment;Cueing for safety;Cueing for sequencing General ADL Comments: Session focus on increasing activity tolerance, patient able to sit EOB for less than five minutes due to pain and fatigue. Patient then completing 2 squat pivots to [facility] with OT and PT providing max A of 2. Patient then using level one theraband to complete 5 reps with L arm for horizontal shoulder abduction and 10 reps with R arm with max encouragement needed.    Extremity/Trunk Assessment Upper Extremity Assessment Upper Extremity Assessment: Generalized weakness            Vision       Perception     Praxis      Cognition Arousal/Alertness: Awake/alert Behavior During Therapy: Flat affect Overall Cognitive Status: Impaired/Different from baseline                     Current Attention Level: Sustained   Following Commands: Follows one step commands with increased time Safety/Judgement: Decreased awareness of safety, Decreased awareness of deficits   Problem Solving: Slow processing, Decreased initiation, Requires tactile cues, Requires verbal cues General Comments: very slow to respond to commands and only whispering to communicate        Exercises Exercises: General Upper Extremity General Exercises - Upper Extremity Shoulder ABduction: Strengthening, Both, 10 reps, Theraband Theraband Level (Shoulder Abduction): Level 1 (Yellow)    Shoulder Instructions       General Comments VSS, BP lower (94/63 MAP 73 then dropping to 80s over 60s with map of 68)    Pertinent Vitals/ Pain       Pain Assessment Pain Assessment: Faces Faces Pain Scale: Hurts worst Pain Location: abdomen, generalized Pain Descriptors / Indicators: Discomfort, Grimacing Pain Intervention(s): Limited activity within patient's tolerance, Monitored during session, Repositioned, Patient requesting pain meds-RN notified  Home Living                                          Prior  Functioning/Environment              Frequency  Min 2X/week        Progress Toward Goals  OT Goals(current goals can now be found in the care plan section)  Progress towards OT goals: Progressing toward goals  Acute Rehab OT Goals Patient Stated Goal: to get stronger OT Goal Formulation: With family Time For Goal Achievement: 03/06/23 Potential to Achieve Goals: Good  Plan Discharge plan remains appropriate    Co-evaluation    PT/OT/SLP Co-Evaluation/Treatment: Yes Reason for Co-Treatment: Complexity of the patient's impairments (multi-system involvement);For patient/therapist safety;To address functional/ADL transfers   OT goals addressed during session: ADL's and self-care;Strengthening/ROM      AM-PAC OT "6 Clicks" Daily Activity     Outcome Measure   Help from another person eating meals?: Total (NPO) Help from another person taking care of personal grooming?: A Lot Help from another person toileting, which includes using toliet, bedpan, or urinal?: A Lot Help from another person bathing (including washing, rinsing, drying)?: A Lot Help from another person to put on and taking off regular upper body clothing?: A Lot Help from another person to put on and taking off regular  lower body clothing?: A Lot 6 Click Score: 11    End of Session    OT Visit Diagnosis: Unsteadiness on feet (R26.81);Other abnormalities of gait and mobility (R26.89);Muscle weakness (generalized) (M62.81);Other symptoms and signs involving cognitive function;Pain Pain - Right/Left: Right   Activity Tolerance Patient limited by fatigue;Patient limited by pain   Patient Left in bed;with call bell/phone within reach;with nursing/sitter in room;with family/visitor present;with bed alarm set   Nurse Communication Mobility status        Time: 1610-9604 OT Time Calculation (min): 23 min  Charges: OT General Charges $OT Visit: 1 Visit OT Treatments $Self Care/Home Management : 8-22  mins  Pollyann Glen E. Jevan Gaunt, OTR/L Acute Rehabilitation Services [phone]   Cherlyn Cushing 02/24/2023, 3:40 PM

## 2023-02-24 NOTE — Consult Note (Signed)
WOC Nurse ostomy follow up Stoma type/location: LLQ colostomy Education provided: Wife changed pouch 02/23/23 independently.  Switched to roll closure pouch.  She applied foam dressing around JP drain as shown last week. She feels much more confident with ostomy care.  Would appreciate periodic checks from WOC team in case issues arise.  Liquid melena in pouch.  NG tube in place.  Patient is ill appearing.  Enrolled patient in [employer] Start Discharge program: Yes previously.  Will follow weekly.  Mike Gip MSN, RN, FNP-BC CWON Wound, Ostomy, Continence Nurse Outpatient [facility] [phone] Pager [phone]

## 2023-02-24 NOTE — Progress Notes (Signed)
General Surgery Follow Up Note  Subjective:    Overnight Issues: Responded appropriately to blood transfusion. BP improved. Hgb stable at 10 this morning.  Objective:  Vital signs for last 24 hours: Temp:  [96.9 F (36.1 C)-98.2 F (36.8 C)] 98.2 F (36.8 C) (05/20 0800) Pulse Rate:  [80-97] 89 (05/20 0800) Resp:  [13-28] 13 (05/20 0800) BP: (56-102)/(34-83) 99/56 (05/20 0800) SpO2:  [97 %-100 %] 100 % (05/20 0800) Weight:  [65 kg] 65 kg (05/20 0349)  Hemodynamic parameters for last 24 hours:    Intake/Output from previous day: 05/19 0701 - 05/20 0700 In: 5612.4 [I.V.:2614.4; Blood:602.7; NG/GT:2230; IV Piggyback:165.3] Out: 4345 [Urine:2550; Emesis/NG output:710; Drains:460; Stool:625]  Intake/Output this shift: Total I/O In: 189.9 [I.V.:189.9] Out: -   Vent settings for last 24 hours:    Physical Exam:  Gen: NAD, appears frail and ill Neuro: follows commands, alert, communicative HEENT: scleral icterus; NG in place draining nonbloody gastric contents. CV: RRR Pulm: nonlabored respirations on room air Abd: soft, nondistended, midline wound with minimal fibrinous exudate, fascia in tact. LUQ JP with minimal turbid brown fluid. Duodenostomy with scant drainage. T tube bilious. Ileostomy productive of stool, appears melanic. Extr: wwp, no edema Skin: jaundiced  Results for orders placed or performed during the hospital encounter of 01/13/23 (from the past 24 hour(s))  Hemoglobin and hematocrit, blood     Status: Abnormal   Collection Time: 02/23/23 12:01 PM  Result Value Ref Range   Hemoglobin 6.8 (LL) 13.0 - 17.0 g/dL   HCT 16.1 (L) 09.6 - 04.5 %  Type and screen [facility]     Status: None (Preliminary result)   Collection Time: 02/23/23 12:15 PM  Result Value Ref Range   ABO/RH(D) O NEG    Antibody Screen NEG    Sample Expiration 02/26/2023,2359    Unit Number [id number]    Blood Component Type RBC, LR IRR    Unit division 00     Status of Unit ISSUED    Transfusion Status OK TO TRANSFUSE    Crossmatch Result COMPATIBLE    Unit Number [id number]    Blood Component Type RED CELLS,LR    Unit division 00    Status of Unit ALLOCATED    Transfusion Status OK TO TRANSFUSE    Crossmatch Result COMPATIBLE    Unit Number [id number]    Blood Component Type RED CELLS,LR    Unit division 00    Status of Unit ISSUED    Transfusion Status OK TO TRANSFUSE    Crossmatch Result COMPATIBLE    Unit Number [id number]    Blood Component Type RED CELLS,LR    Unit division 00    Status of Unit ALLOCATED    Transfusion Status OK TO TRANSFUSE    Crossmatch Result COMPATIBLE   Prepare RBC (crossmatch)     Status: None   Collection Time: 02/23/23 12:38 PM  Result Value Ref Range   Order Confirmation      ORDER PROCESSED BY BLOOD BANK Performed at [facility], [address], [place], [place] [address]   Glucose, capillary     Status: Abnormal   Collection Time: 02/23/23 12:48 PM  Result Value Ref Range   Glucose-Capillary 125 (H) 70 - 99 mg/dL  Glucose, capillary     Status: Abnormal   Collection Time: 02/23/23  4:24 PM  Result Value Ref Range   Glucose-Capillary 108 (H) 70 - 99 mg/dL  Hemoglobin and hematocrit, blood  Status: Abnormal   Collection Time: 02/23/23  7:00 PM  Result Value Ref Range   Hemoglobin 8.7 (L) 13.0 - 17.0 g/dL   HCT 16.1 (L) 09.6 - 04.5 %  Glucose, capillary     Status: None   Collection Time: 02/23/23  7:52 PM  Result Value Ref Range   Glucose-Capillary 97 70 - 99 mg/dL  Glucose, capillary     Status: None   Collection Time: 02/23/23 11:43 PM  Result Value Ref Range   Glucose-Capillary 91 70 - 99 mg/dL  Hemoglobin and hematocrit, blood     Status: Abnormal   Collection Time: 02/24/23  1:00 AM  Result Value Ref Range   Hemoglobin 9.5 (L) 13.0 - 17.0 g/dL   HCT 40.9 (L) 81.1 - 91.4 %  Glucose, capillary     Status: None   Collection Time: 02/24/23  3:56 AM  Result Value  Ref Range   Glucose-Capillary 80 70 - 99 mg/dL  Comprehensive metabolic panel     Status: Abnormal   Collection Time: 02/24/23  5:40 AM  Result Value Ref Range   Sodium 141 135 - 145 mmol/L   Potassium 4.2 3.5 - 5.1 mmol/L   Chloride 117 (H) 98 - 111 mmol/L   CO2 16 (L) 22 - 32 mmol/L   Glucose, Bld 111 (H) 70 - 99 mg/dL   BUN 88 (H) 8 - 23 mg/dL   Creatinine, Ser 7.82 (H) 0.61 - 1.24 mg/dL   Calcium 8.6 (L) 8.9 - 10.3 mg/dL   Total Protein 6.0 (L) 6.5 - 8.1 g/dL   Albumin 2.1 (L) 3.5 - 5.0 g/dL   AST 956 (H) 15 - 41 U/L   ALT 128 (H) 0 - 44 U/L   Alkaline Phosphatase 106 38 - 126 U/L   Total Bilirubin 26.2 (HH) 0.3 - 1.2 mg/dL   GFR, Estimated 50 (L) >60 mL/min   Anion gap 8 5 - 15  Magnesium     Status: None   Collection Time: 02/24/23  5:40 AM  Result Value Ref Range   Magnesium 1.7 1.7 - 2.4 mg/dL  Phosphorus     Status: None   Collection Time: 02/24/23  5:40 AM  Result Value Ref Range   Phosphorus 3.2 2.5 - 4.6 mg/dL  CBC     Status: Abnormal   Collection Time: 02/24/23  5:40 AM  Result Value Ref Range   WBC 21.7 (H) 4.0 - 10.5 K/uL   RBC 3.44 (L) 4.22 - 5.81 MIL/uL   Hemoglobin 10.0 (L) 13.0 - 17.0 g/dL   HCT 21.3 (L) 08.6 - 57.8 %   MCV 82.8 80.0 - 100.0 fL   MCH 29.1 26.0 - 34.0 pg   MCHC 35.1 30.0 - 36.0 g/dL   RDW 46.9 (H) 62.9 - 52.8 %   Platelets 155 150 - 400 K/uL   nRBC 0.0 0.0 - 0.2 %  Triglycerides     Status: None   Collection Time: 02/24/23  5:41 AM  Result Value Ref Range   Triglycerides 130 <150 mg/dL  Glucose, capillary     Status: None   Collection Time: 02/24/23  7:40 AM  Result Value Ref Range   Glucose-Capillary 96 70 - 99 mg/dL    Assessment & Plan: The plan of care was discussed with the bedside nurse for the day, who is in agreement with this plan and no additional concerns were raised.   Present on Admission:  Bowel obstruction (HCC)  Protein-calorie malnutrition, severe (HCC)  Duodenal obstruction    LOS: 42 days    Additional comments:I reviewed the patient's new clinical lab test results.   and I reviewed the patients new imaging test results.    67 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  4/22 - Ex lap, adhesiolysis, duodenojejunal bypass, duodenostomy tube placement, T tube placement in common bile duct. 4/23 - Takeback for bleeding with hemorrhagic shock, resection of ischemic transverse colon, left in discontinuity with Abthera 4/24 - Takeback for bleeding, abdominal washout, ileocecectomy, pack placement, placement of Abthera 4/25 - Takeback, resection of 10cm necrotic distal ileum, washout, removal of packs, placement of Abthera 4/27 - Takeback, placement of J tube, end ileostomy, abdominal closure   - Hyperbilirubinemia: Tube cholangiogram on 5/3 showed patency of the biliary tree with no obstruction. Likely multifactorial due to massive transfusion with hemolysis and shock liver. Tbili seems to have peaked, anticipate will slowly downtrend. Transaminases are near normal and synthetic function is normal. Tbili 26 today, continue checking LFTs twice weekly. - Keep T tube and D tube to gravity drainage. JP to bulb suction. Drain output decreased with NG decompression. T tube is working. D tube output has been very minimal. - Pain: prn dilaudid - Agitation/delirium: Improved. - ID: Pseudomonas pneumonia, resistant to Zosyn and cefepime. Completed Ciprofloxacin x7d. - Wound care: Saline wet-to-dry to midline wound - FEN: NPO, ok for ice chips. Hold tube feeds given small bowel distension secondary to ileus and likely dysmotility. Continue full strength TPN.  - AKI: Improving. - Urinary retention: Patient is requiring I/O cath and has not had any spontaneous voids. Replace foley today. - WOC following for ostomy care and teaching - PT/OT following. Patient is very weak and deconditioned. - Blood in ileostomy: Hgb stable, no signs of ongoing bleeding. Continue to monitor. - VTE: SCDs, hold lovenox given  GI bleeding - Dispo: ICU  Sophronia Simas, MD [facility] Surgery General, Hepatobiliary and Pancreatic Surgery 02/24/23 8:47 AM

## 2023-02-24 NOTE — Progress Notes (Signed)
PHARMACY - TOTAL PARENTERAL NUTRITION CONSULT NOTE  Indication: Prolonged ileus, intolerance to enteral feeding  Patient Measurements: Height: 6\' 1"  (185.4 cm) Weight: 65 kg (143 lb 4.8 oz) IBW/kg (Calculated) : 79.9 TPN AdjBW (KG): 65.5 Body mass index is 18.91 kg/m.  Assessment: 23 yom with hx intestinal surgery and subsequent SBO requiring additional resection, admitted 01/13/23 with recurrent issues with SBO at the duodenum. Now s/p duodenal bypass complicated due to adhesions and resulted in small common bile duct injury requiring T-tube placement and external duodenostomy. Patient with massive bleed/hemorrhagic shock, s/p MTP. TPN initiated 4/9-5/6, then transitioned to TF. TF then held 5/14 given small bowel distension secondary to suspected ileus and likely dysmotility. Pharmacy consulted to resume TPN 5/16.  Glucose / Insulin: no hx DM - CBGs: 80-125, no SSI ordered   Electrolytes: Na 141, K 4.2 (goal >/= 4 for ileus), Cl: 117, HCO3: 16, Phos: 3.2, Mag: 1.7, CoCa: 10.3  Renal: AKI resolving - SCr 1.53 (BL ~0.7-0.9), BUN up to 88  Hepatic: LFTs trend back up - AST 144/ALT 128, Tbili back up to 26.2 (peak ~30, noted scleral icterus), TG 130, albumin 2.1 Intake / Output; MIVF: UOP 1.6 ml/kg/hr, NGT output 710 ml, Biliary T-tube 460 ml, LUQ JP drain minimal output, duodenostomy ; MIVF off  GI Imaging:  5/14 CT abd pelvis - feculent material throughout the SB/abdomen, suspicious for SB obstruction, bowel perforation/dehiscence, interval development of gas-containing fluid collection in R paracolic gutter 5/3 CT abdomen: bowel edema, no cause identified for T-bili elevation  5/3 cholangiogram: mild dilation of bilary tract   4/4 UGI: Findings suggestive of chronic pSBO or ileus 4/9 CT: Ileus vs high grade partial SBO GI Surgeries / Procedures: 4/22 - Ex lap, adhesiolysis, duodenojejunal bypass, duodenostomy tube placement, T tube placement in common bile duct. 4/23 - Takeback for  bleeding with hemorrhagic shock, resection of ischemic transverse colon, left in discontinuity with Abthera 4/24 - Takeback for bleeding, abdominal washout, ileocecectomy, pack placement, placement of Abthera 4/25 - Takeback, resection of 10cm necrotic distal ileum, washout, removal of packs, placement of Abthera 4/27 - Takeback, placement of J tube, end ileostomy, abdominal closure  Central access: triple lumen PICC placed 01/23/23 TPN start date: 01/14/23-02/10/23, 5/16>>  Nutritional Goals: Goal TPN rate is 95 mL/hr (provides 134 g of protein and 2307 kcals per day)  RD Estimated Needs Total Energy Estimated Needs: 2300-2600 Total Protein Estimated Needs: 130-145 grams Total Fluid Estimated Needs: 2.1L/day  Current Nutrition:  TPN NPO - ice chips   Plan:  Continue TPN at 95 ml/hr at 1800, providing 134g AA and 2307kcal meeting 100% of nutritional needs.  Electrolytes in TPN: Na to 81mEq/L, K 40 mEq/L, decrease Ca 52mEq/L, increase Mag 60mEq/L, Phos 97mmol/L since 5/16, max acetate Add standard MVI to TPN.  Remove standard trace elements with elevated Tbili and add back zinc 5mg . Continue to hold selenium and chromium for now with AKI. Renal function still elevated from baseline.  Monitor TPN labs standard Mon/Thurs Small LFT increase overnight, will trend for tomorrow and decrease lipid content if continued upward. Currently at 30% of TPN.   Estill Batten, PharmD, BCCCP  Clinical Pharmacist Please see AMION for all Pharmacists' Contact Phone Numbers 02/24/2023, 8:25 AM

## 2023-02-24 NOTE — Progress Notes (Signed)
Physical Therapy Treatment Patient Details Name: Bradley Dominguez MRN: [id number] DOB: 1956/02/08 Today's Date: 02/24/2023   History of Present Illness 67 yo M HF, chronic partial small bowel obstruction and multiple previous abd surgeries  who was admitted  4/8 from home w n/v/bloating. Underwent EGD 4/11 -- concerning for duodenal obstruction. Developed hemorrhagic shock 4/15 in the setting of hematemesis / hematochezia, decompensated requiring, intubation, embolization of jejunal arcade branch. Extubated 01/22/23. Underwent ex lap adhesiolysis, duodenojejunal bypass, duodenostomy tube placement, T tube placement in common bile duct 4/22, transferred to [facility] 4/23 with hemorrhagic shock, return to OR 4/23, 4/24, 4/25 and finally 4/27 with placement of J tube, end ileostomy, abdominal closure.  Extubated 4/30.  NGT placed 5/14 due to possible ileus.    PT Comments    Patient with slow progress continues with medical issues limiting activity tolerance with NGT to suction with heavy output during mobility and cachexia.  Patient able to use legs to assist with scooting toward [facility] with +2 A.  Wife present and remains super supportive and encouraging.  PT will continue to follow.  Hopeful he can progress to tolerate intensive inpatient rehab.    Recommendations for follow up therapy are one component of a multi-disciplinary discharge planning process, led by the attending physician.  Recommendations may be updated based on patient status, additional functional criteria and insurance authorization.  Follow Up Recommendations       Assistance Recommended at Discharge Frequent or constant Supervision/Assistance  Patient can return home with the following Two people to help with walking and/or transfers;Assistance with cooking/housework;Direct supervision/assist for medications management;Assist for transportation;Help with stairs or ramp for entrance;Two people to help with bathing/dressing/bathroom    Equipment Recommendations  Other (comment) (TBA)    Recommendations for Other Services       Precautions / Restrictions Precautions Precautions: Fall Precaution Comments: multiple lines, JP drain, biliary tube, ileostomy; abdominal incision; NGT to suction Restrictions Weight Bearing Restrictions: No     Mobility  Bed Mobility Overal bed mobility: Needs Assistance Bed Mobility: Rolling, Sidelying to Sit, Sit to Sidelying Rolling: Mod assist, +2 for physical assistance, +2 for safety/equipment Sidelying to sit: Mod assist, +2 for physical assistance, +2 for safety/equipment     Sit to sidelying: +2 for physical assistance, Mod assist General bed mobility comments: +2 assist with initiation and to move legs off EOB and to lift trunk upright; assist for legs onto bed pt leaning on elbow to manage trunk then assist to roll back and get his arm out from under him; +2 to scoot (incremental squat pivots) to [facility]    Transfers Overall transfer level: Needs assistance           Squat pivot transfers: Mod assist, +2 safety/equipment     General transfer comment: unable to come fully into standing due to fatigue; scooting up toward HOB x 2 with +2 A    Ambulation/Gait                   Stairs             Wheelchair Mobility    Modified Rankin (Stroke Patients Only)       Balance Overall balance assessment: Needs assistance Sitting-balance support: Feet supported, Bilateral upper extremity supported Sitting balance-Leahy Scale: Fair Sitting balance - Comments: S for balance on EOB though with poor upright tolerance and core weakness cues for head upright  Cognition Arousal/Alertness: Awake/alert Behavior During Therapy: Flat affect Overall Cognitive Status: Impaired/Different from baseline                         Following Commands: Follows one step commands with increased  time Safety/Judgement: Decreased awareness of safety, Decreased awareness of deficits   Problem Solving: Slow processing, Decreased initiation, Requires tactile cues, Requires verbal cues General Comments: very slow to respond to commands and only whispering to communicate        Exercises      General Comments General comments (skin integrity, edema, etc.): BP lower in sitting 94/63, then down to 88/58      Pertinent Vitals/Pain Pain Assessment Pain Assessment: Faces Faces Pain Scale: Hurts worst Pain Location: abdomen, generalized Pain Descriptors / Indicators: Discomfort, Grimacing Pain Intervention(s): Patient requesting pain meds-RN notified, Monitored during session, Limited activity within patient's tolerance    Home Living                          Prior Function            PT Goals (current goals can now be found in the care plan section) Progress towards PT goals: Progressing toward goals    Frequency    Min 3X/week      PT Plan Current plan remains appropriate    Co-evaluation PT/OT/SLP Co-Evaluation/Treatment: Yes Reason for Co-Treatment: Complexity of the patient's impairments (multi-system involvement);For patient/therapist safety;To address functional/ADL transfers PT goals addressed during session: Mobility/safety with mobility;Balance;Strengthening/ROM OT goals addressed during session: ADL's and self-care;Strengthening/ROM      AM-PAC PT "6 Clicks" Mobility   Outcome Measure  Help needed turning from your back to your side while in a flat bed without using bedrails?: A Lot Help needed moving from lying on your back to sitting on the side of a flat bed without using bedrails?: A Lot Help needed moving to and from a bed to a chair (including a wheelchair)?: Total Help needed standing up from a chair using your arms (e.g., wheelchair or bedside chair)?: Total Help needed to walk in hospital room?: Total Help needed climbing 3-5 steps  with a railing? : Total 6 Click Score: 8    End of Session   Activity Tolerance: Patient limited by fatigue Patient left: in bed;with call bell/phone within reach   PT Visit Diagnosis: Other abnormalities of gait and mobility (R26.89);Muscle weakness (generalized) (M62.81);Other symptoms and signs involving the nervous system (R29.898)     Time: 1610-9604 PT Time Calculation (min) (ACUTE ONLY): 23 min  Charges:  $Therapeutic Activity: 8-22 mins                     Sheran Lawless, PT Acute Rehabilitation Services Office:[phone] 02/24/2023    Bradley Dominguez 02/24/2023, 5:01 PM

## 2023-02-24 NOTE — TOC Progression Note (Signed)
Transition of Care [facility]) - Progression Note    Patient Details  Name: Bradley Dominguez MRN: [id number] Date of Birth: May 20, 1956  Transition of Care [facility]) CM/SW Contact  Mearl Latin, LCSW Phone Number: 02/24/2023, 5:10 PM  Clinical Narrative:    TOC continuing to follow for any TOC needs.     Barriers to Discharge: Continued Medical Work up  Expected Discharge Plan and Services                                               Social Determinants of Health (SDOH) Interventions SDOH Screenings   Food Insecurity: No Food Insecurity (01/13/2023)  Housing: Low Risk  (01/13/2023)  Transportation Needs: No Transportation Needs (01/13/2023)  Utilities: Not At Risk (01/13/2023)  Tobacco Use: Low Risk  (02/02/2023)    Readmission Risk Interventions     No data to display

## 2023-02-24 NOTE — Progress Notes (Signed)
Inpatient Rehab Admissions Coordinator:    CIR following. Pt. Currently not medically stable for d/c to CIR. Will continue to follow for potential admit once medically stable.  Megan Salon, MS, CCC-SLP Rehab Admissions Coordinator  [phone] (celll) [phone] (office)

## 2023-02-25 LAB — CBC
HCT: 29.2 % — ABNORMAL LOW (ref 39.0–52.0)
Hemoglobin: 10.1 g/dL — ABNORMAL LOW (ref 13.0–17.0)
MCH: 28.7 pg (ref 26.0–34.0)
MCHC: 34.6 g/dL (ref 30.0–36.0)
MCV: 83 fL (ref 80.0–100.0)
Platelets: 176 10*3/uL (ref 150–400)
RBC: 3.52 MIL/uL — ABNORMAL LOW (ref 4.22–5.81)
RDW: 22.9 % — ABNORMAL HIGH (ref 11.5–15.5)
WBC: 21.3 10*3/uL — ABNORMAL HIGH (ref 4.0–10.5)
nRBC: 0 % (ref 0.0–0.2)

## 2023-02-25 LAB — BPAM RBC
Blood Product Expiration Date: 202405262359
Blood Product Expiration Date: 202405292359
ISSUE DATE / TIME: 202405191630
Unit Type and Rh: 9500
Unit Type and Rh: 9500
Unit Type and Rh: 9500

## 2023-02-25 LAB — TYPE AND SCREEN
Unit division: 0
Unit division: 0

## 2023-02-25 LAB — BASIC METABOLIC PANEL
Anion gap: 9 (ref 5–15)
BUN: 96 mg/dL — ABNORMAL HIGH (ref 8–23)
CO2: 17 mmol/L — ABNORMAL LOW (ref 22–32)
Calcium: 8.8 mg/dL — ABNORMAL LOW (ref 8.9–10.3)
Chloride: 118 mmol/L — ABNORMAL HIGH (ref 98–111)
Creatinine, Ser: 1.34 mg/dL — ABNORMAL HIGH (ref 0.61–1.24)
GFR, Estimated: 58 mL/min — ABNORMAL LOW (ref >60)
Glucose, Bld: 128 mg/dL — ABNORMAL HIGH (ref 70–99)
Potassium: 4.4 mmol/L (ref 3.5–5.1)
Sodium: 144 mmol/L (ref 135–145)

## 2023-02-25 LAB — MAGNESIUM: Magnesium: 1.8 mg/dL (ref 1.7–2.4)

## 2023-02-25 LAB — GLUCOSE, CAPILLARY
Glucose-Capillary: 102 mg/dL — ABNORMAL HIGH (ref 70–99)
Glucose-Capillary: 108 mg/dL — ABNORMAL HIGH (ref 70–99)
Glucose-Capillary: 114 mg/dL — ABNORMAL HIGH (ref 70–99)
Glucose-Capillary: 114 mg/dL — ABNORMAL HIGH (ref 70–99)
Glucose-Capillary: 97 mg/dL (ref 70–99)
Glucose-Capillary: 98 mg/dL (ref 70–99)

## 2023-02-25 LAB — PHOSPHORUS: Phosphorus: 4.4 mg/dL (ref 2.5–4.6)

## 2023-02-25 MED ORDER — MAGNESIUM SULFATE 2 GM/50ML IV SOLN
2.0000 g | Freq: Once | INTRAVENOUS | Status: AC
  Administered 2023-02-25: 2 g via INTRAVENOUS
  Filled 2023-02-25: qty 50

## 2023-02-25 MED ORDER — FREE WATER
30.0000 mL | Freq: Two times a day (BID) | Status: DC
  Administered 2023-02-25 – 2023-03-02 (×12): 30 mL

## 2023-02-25 MED ORDER — ZINC CHLORIDE 1 MG/ML IV SOLN
INTRAVENOUS | Status: AC
  Filled 2023-02-25: qty 1345.2

## 2023-02-25 NOTE — Progress Notes (Signed)
PHARMACY - TOTAL PARENTERAL NUTRITION CONSULT NOTE  Indication: Prolonged ileus, intolerance to enteral feeding  Patient Measurements: Height: 6\' 1"  (185.4 cm) Weight: 65 kg (143 lb 4.8 oz) IBW/kg (Calculated) : 79.9 TPN AdjBW (KG): 65.5 Body mass index is 18.91 kg/m.  Assessment: 24 yom with hx intestinal surgery and subsequent SBO requiring additional resection, admitted 01/13/23 with recurrent issues with SBO at the duodenum. Now s/p duodenal bypass complicated due to adhesions and resulted in small common bile duct injury requiring T-tube placement and external duodenostomy. Patient with massive bleed/hemorrhagic shock, s/p MTP. TPN initiated 4/9-5/6, then transitioned to TF. TF then held 5/14 given small bowel distension secondary to suspected ileus and likely dysmotility. Pharmacy consulted to resume TPN 5/16.  Glucose / Insulin: no hx DM - CBGs: 93-128, no SSI ordered   Electrolytes: Na 144 (Free water 30 Q12H for Jtube patency added), K 4.4 (goal >/= 4 for ileus), Cl: 118, HCO3: 17, Phos: 4.4 (removed 5/16), Mag: 1.8 (increased 5/20, goal > 2 for ileus), CoCa: 10.3 (decreased 5/20)  Renal: AKI resolving but still not at baseline - SCr 1.34 (BL ~0.7-0.9), BUN up to 96  Hepatic: LFTs trend back up - AST 144/ALT 128, Tbili back up to 26.2 (peak ~30, noted scleral icterus), TG 130, albumin 2.1 Intake / Output; MIVF: UOP 2.5 ml/kg/hr, NGT output 950 ml, Biliary T-tube 325 ml, LUQ JP drain minimal output, duodenostomy ; MIVF off  GI Imaging:  5/14 CT abd pelvis - feculent material throughout the SB/abdomen, suspicious for SB obstruction, bowel perforation/dehiscence, interval development of gas-containing fluid collection in R paracolic gutter 5/3 CT abdomen: bowel edema, no cause identified for T-bili elevation  5/3 cholangiogram: mild dilation of bilary tract   4/4 UGI: Findings suggestive of chronic pSBO or ileus 4/9 CT: Ileus vs high grade partial SBO GI Surgeries /  Procedures: 4/22 - Ex lap, adhesiolysis, duodenojejunal bypass, duodenostomy tube placement, T tube placement in common bile duct. 4/23 - Takeback for bleeding with hemorrhagic shock, resection of ischemic transverse colon, left in discontinuity with Abthera 4/24 - Takeback for bleeding, abdominal washout, ileocecectomy, pack placement, placement of Abthera 4/25 - Takeback, resection of 10cm necrotic distal ileum, washout, removal of packs, placement of Abthera 4/27 - Takeback, placement of J tube, end ileostomy, abdominal closure  Central access: triple lumen PICC placed 01/23/23 TPN start date: 01/14/23-02/10/23, 5/16>>  Nutritional Goals: Goal TPN rate is 95 mL/hr (provides 134 g of protein and 2307 kcals per day)  RD Estimated Needs Total Energy Estimated Needs: 2300-2600 Total Protein Estimated Needs: 130-145 grams Total Fluid Estimated Needs: 2.1L/day  Current Nutrition:  TPN NPO - ice chips   Plan:  Continue TPN at 95 ml/hr at 1800, providing 134g AA and 2307kcal meeting 100% of nutritional needs.  Electrolytes in TPN: Na 92mEq/L, small decrease to K 35 mEq/L (steady upward trend), Ca 64mEq/L, Mag 34mEq/L, Phos 105mmol/L since 5/16, max acetate Will give 2g mag sulfate for goal of > 2 for ileus. Trend for full 24 hours after increase on 5/20 before further adjusting.  Add standard MVI to TPN.  Remove standard trace elements with elevated Tbili and add back zinc 5mg . Continue to hold selenium and chromium for now with AKI. Renal function still elevated from baseline.  Monitor TPN labs standard Mon/Thurs Small LFT increase overnight, will trend to evaluate lipid content, however per CCS note expected to take a significant time for T-bili and LFTs to normalize.  Currently at 30% of TPN.   Herbert Seta  Andrey Campanile, PharmD, BCCCP  Clinical Pharmacist Please see AMION for all Pharmacists' Contact Phone Numbers 02/25/2023, 8:00 AM

## 2023-02-25 NOTE — Progress Notes (Signed)
General Surgery Follow Up Note  Subjective:    Overnight Issues: Hgb remains stable. Creatinine down to 1.3. Hemodynamically stable. Wife reports he is sometimes restless.  Objective:  Vital signs for last 24 hours: Temp:  [97.7 F (36.5 C)-98.2 F (36.8 C)] 98 F (36.7 C) (05/21 0400) Pulse Rate:  [83-100] 96 (05/21 0600) Resp:  [12-31] 18 (05/21 0600) BP: (80-105)/(38-63) 99/63 (05/21 0600) SpO2:  [95 %-100 %] 99 % (05/21 0600) Weight:  [65 kg] 65 kg (05/21 0457)  Hemodynamic parameters for last 24 hours:    Intake/Output from previous day: 05/20 0701 - 05/21 0700 In: 2325.7 [I.V.:2270.7; IV Piggyback:55] Out: 5335 [Urine:3890; Emesis/NG output:950; Drains:325; Stool:170]  Intake/Output this shift: No intake/output data recorded.  Vent settings for last 24 hours:    Physical Exam:  Gen: NAD, appears frail and ill Neuro: alert, follows commands HEENT: scleral icterus; NG in place draining nonbloody gastric contents. CV: RRR Pulm: nonlabored respirations on room air Abd: soft, nondistended, midline wound with fibrinous exudate, fascia in tact. LUQ JP with dark brown fluid. Duodenostomy to gravity with scant drainage. T tube bilious. Ileostomy productive of melanic stool. Extr: wwp, no edema Skin: jaundiced GU: foley in place, dark urine  Results for orders placed or performed during the hospital encounter of 01/13/23 (from the past 24 hour(s))  Glucose, capillary     Status: None   Collection Time: 02/24/23  7:40 AM  Result Value Ref Range   Glucose-Capillary 96 70 - 99 mg/dL  Glucose, capillary     Status: None   Collection Time: 02/24/23 11:18 AM  Result Value Ref Range   Glucose-Capillary 99 70 - 99 mg/dL  Hemoglobin and hematocrit, blood     Status: Abnormal   Collection Time: 02/24/23  3:41 PM  Result Value Ref Range   Hemoglobin 9.6 (L) 13.0 - 17.0 g/dL   HCT 19.5 (L) 09.3 - 26.7 %  Glucose, capillary     Status: None   Collection Time: 02/24/23   3:54 PM  Result Value Ref Range   Glucose-Capillary 93 70 - 99 mg/dL  Glucose, capillary     Status: Abnormal   Collection Time: 02/24/23  7:40 PM  Result Value Ref Range   Glucose-Capillary 108 (H) 70 - 99 mg/dL  Glucose, capillary     Status: Abnormal   Collection Time: 02/24/23 11:36 PM  Result Value Ref Range   Glucose-Capillary 101 (H) 70 - 99 mg/dL  Glucose, capillary     Status: Abnormal   Collection Time: 02/25/23  3:36 AM  Result Value Ref Range   Glucose-Capillary 114 (H) 70 - 99 mg/dL  CBC     Status: Abnormal   Collection Time: 02/25/23  4:21 AM  Result Value Ref Range   WBC 21.3 (H) 4.0 - 10.5 K/uL   RBC 3.52 (L) 4.22 - 5.81 MIL/uL   Hemoglobin 10.1 (L) 13.0 - 17.0 g/dL   HCT 12.4 (L) 58.0 - 99.8 %   MCV 83.0 80.0 - 100.0 fL   MCH 28.7 26.0 - 34.0 pg   MCHC 34.6 30.0 - 36.0 g/dL   RDW 33.8 (H) 25.0 - 53.9 %   Platelets 176 150 - 400 K/uL   nRBC 0.0 0.0 - 0.2 %  Basic metabolic panel     Status: Abnormal   Collection Time: 02/25/23  4:21 AM  Result Value Ref Range   Sodium 144 135 - 145 mmol/L   Potassium 4.4 3.5 - 5.1 mmol/L   Chloride  118 (H) 98 - 111 mmol/L   CO2 17 (L) 22 - 32 mmol/L   Glucose, Bld 128 (H) 70 - 99 mg/dL   BUN 96 (H) 8 - 23 mg/dL   Creatinine, Ser 1.61 (H) 0.61 - 1.24 mg/dL   Calcium 8.8 (L) 8.9 - 10.3 mg/dL   GFR, Estimated 58 (L) >60 mL/min   Anion gap 9 5 - 15  Phosphorus     Status: None   Collection Time: 02/25/23  4:21 AM  Result Value Ref Range   Phosphorus 4.4 2.5 - 4.6 mg/dL  Magnesium     Status: None   Collection Time: 02/25/23  4:21 AM  Result Value Ref Range   Magnesium 1.8 1.7 - 2.4 mg/dL    Assessment & Plan: The plan of care was discussed with the bedside nurse for the day, who is in agreement with this plan and no additional concerns were raised.   Present on Admission:  Bowel obstruction (HCC)  Protein-calorie malnutrition, severe (HCC)  Duodenal obstruction    LOS: 43 days   Additional comments:I reviewed  the patient's new clinical lab test results.   and I reviewed the patients new imaging test results.    67 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  4/22 - Ex lap, adhesiolysis, duodenojejunal bypass, duodenostomy tube placement, T tube placement in common bile duct. 4/23 - Takeback for bleeding with hemorrhagic shock, resection of ischemic transverse colon, left in discontinuity with Abthera 4/24 - Takeback for bleeding, abdominal washout, ileocecectomy, pack placement, placement of Abthera 4/25 - Takeback, resection of 10cm necrotic distal ileum, washout, removal of packs, placement of Abthera 4/27 - Takeback, placement of J tube, end ileostomy, abdominal closure   - Hyperbilirubinemia: Tube cholangiogram on 5/3 showed patency of the biliary tree with no obstruction. Likely multifactorial due to massive transfusion with hemolysis and shock liver. Tbili seems to have peaked, anticipate will normalize very slowly. Synthetic function is normal. Continue to trend LFTs twice weekly. - Keep T tube and D tube to gravity drainage. JP to bulb suction. Minimal output from D tube, which has been flushed and is patent. T tube is working.  - Pain: prn dilaudid - Wound care: Saline wet-to-dry to midline wound - FEN: NPO, ok for ice chips. Hold tube feeds given small bowel distension secondary to ileus and likely dysmotility. Continue full strength TPN.  NG decompression, NG output is high. - AKI: Improving. - Urinary retention: Foley replaced on 5/20 for second time for urinary retention. Keep in place. - WOC following for ostomy care and teaching - PT/OT following. Patient is profoundly deconditioned. - Blood in ileostomy: Hgb stable, no signs of ongoing bleeding, likely still clearing old blood. Continue to monitor. - VTE: SCDs, lovenox on hold given recent GI bleeding. - Dispo: ICU  Sophronia Simas, MD [facility] Surgery General, Hepatobiliary and Pancreatic Surgery 02/25/23 7:11 AM

## 2023-02-26 LAB — BASIC METABOLIC PANEL
Anion gap: 8 (ref 5–15)
BUN: 105 mg/dL — ABNORMAL HIGH (ref 8–23)
CO2: 16 mmol/L — ABNORMAL LOW (ref 22–32)
Calcium: 8.7 mg/dL — ABNORMAL LOW (ref 8.9–10.3)
Chloride: 120 mmol/L — ABNORMAL HIGH (ref 98–111)
Creatinine, Ser: 1.32 mg/dL — ABNORMAL HIGH (ref 0.61–1.24)
GFR, Estimated: 59 mL/min — ABNORMAL LOW (ref >60)
Glucose, Bld: 121 mg/dL — ABNORMAL HIGH (ref 70–99)
Potassium: 4.4 mmol/L (ref 3.5–5.1)
Sodium: 144 mmol/L (ref 135–145)

## 2023-02-26 LAB — BPAM RBC
Blood Product Expiration Date: 202406032359
Unit Type and Rh: 9500

## 2023-02-26 LAB — CBC
HCT: 30 % — ABNORMAL LOW (ref 39.0–52.0)
Hemoglobin: 10.2 g/dL — ABNORMAL LOW (ref 13.0–17.0)
MCH: 28.7 pg (ref 26.0–34.0)
MCHC: 34 g/dL (ref 30.0–36.0)
MCV: 84.5 fL (ref 80.0–100.0)
Platelets: 200 10*3/uL (ref 150–400)
RBC: 3.55 MIL/uL — ABNORMAL LOW (ref 4.22–5.81)
RDW: 23.5 % — ABNORMAL HIGH (ref 11.5–15.5)
WBC: 20.9 10*3/uL — ABNORMAL HIGH (ref 4.0–10.5)
nRBC: 0 % (ref 0.0–0.2)

## 2023-02-26 LAB — GLUCOSE, CAPILLARY
Glucose-Capillary: 102 mg/dL — ABNORMAL HIGH (ref 70–99)
Glucose-Capillary: 109 mg/dL — ABNORMAL HIGH (ref 70–99)
Glucose-Capillary: 111 mg/dL — ABNORMAL HIGH (ref 70–99)
Glucose-Capillary: 117 mg/dL — ABNORMAL HIGH (ref 70–99)
Glucose-Capillary: 117 mg/dL — ABNORMAL HIGH (ref 70–99)
Glucose-Capillary: 121 mg/dL — ABNORMAL HIGH (ref 70–99)

## 2023-02-26 LAB — PHOSPHORUS: Phosphorus: 5 mg/dL — ABNORMAL HIGH (ref 2.5–4.6)

## 2023-02-26 LAB — PREPARE RBC (CROSSMATCH)

## 2023-02-26 LAB — TYPE AND SCREEN

## 2023-02-26 LAB — MAGNESIUM: Magnesium: 2.4 mg/dL (ref 1.7–2.4)

## 2023-02-26 LAB — HEMOGLOBIN AND HEMATOCRIT, BLOOD
HCT: 26.4 % — ABNORMAL LOW (ref 39.0–52.0)
Hemoglobin: 8.8 g/dL — ABNORMAL LOW (ref 13.0–17.0)

## 2023-02-26 MED ORDER — NOREPINEPHRINE 4 MG/250ML-% IV SOLN
INTRAVENOUS | Status: AC
  Administered 2023-02-26: 2 ug/min via INTRAVENOUS
  Filled 2023-02-26: qty 250

## 2023-02-26 MED ORDER — NOREPINEPHRINE 4 MG/250ML-% IV SOLN
0.0000 ug/min | INTRAVENOUS | Status: DC
  Administered 2023-02-27: 3 ug/min via INTRAVENOUS
  Administered 2023-02-28: 10 ug/min via INTRAVENOUS
  Administered 2023-02-28: 5 ug/min via INTRAVENOUS
  Administered 2023-03-01: 9 ug/min via INTRAVENOUS
  Administered 2023-03-02 (×3): 7 ug/min via INTRAVENOUS
  Administered 2023-03-03: 8 ug/min via INTRAVENOUS
  Administered 2023-03-03: 11 ug/min via INTRAVENOUS
  Administered 2023-03-03: 9 ug/min via INTRAVENOUS
  Administered 2023-03-04: 8 ug/min via INTRAVENOUS
  Administered 2023-03-04: 12 ug/min via INTRAVENOUS
  Administered 2023-03-04: 7 ug/min via INTRAVENOUS
  Administered 2023-03-05: 6 ug/min via INTRAVENOUS
  Administered 2023-03-05: 3 ug/min via INTRAVENOUS
  Administered 2023-03-06: 10 ug/min via INTRAVENOUS
  Administered 2023-03-06: 9 ug/min via INTRAVENOUS
  Administered 2023-03-07 – 2023-03-10 (×11): 10 ug/min via INTRAVENOUS
  Filled 2023-02-26 (×31): qty 250

## 2023-02-26 MED ORDER — STERILE WATER FOR INJECTION IV SOLN
INTRAVENOUS | Status: DC
  Filled 2023-02-26 (×3): qty 1000

## 2023-02-26 MED ORDER — ZINC CHLORIDE 1 MG/ML IV SOLN
INTRAVENOUS | Status: AC
  Filled 2023-02-26: qty 1345.2

## 2023-02-26 MED ORDER — LACTATED RINGERS IV BOLUS
1000.0000 mL | Freq: Once | INTRAVENOUS | Status: AC
  Administered 2023-02-26: 1000 mL via INTRAVENOUS

## 2023-02-26 NOTE — Progress Notes (Signed)
Physical Therapy Treatment Patient Details Name: Bradley Dominguez MRN: [id number] DOB: 1956-05-18 Today's Date: 02/26/2023   History of Present Illness 67 yo M HF, chronic partial small bowel obstruction and multiple previous abd surgeries  who was admitted  4/8 from home w n/v/bloating. Underwent EGD 4/11 -- concerning for duodenal obstruction. Developed hemorrhagic shock 4/15 in the setting of hematemesis / hematochezia, decompensated requiring, intubation, embolization of jejunal arcade branch. Extubated 01/22/23. Underwent ex lap adhesiolysis, duodenojejunal bypass, duodenostomy tube placement, T tube placement in common bile duct 4/22, transferred to [facility] 4/23 with hemorrhagic shock, return to OR 4/23, 4/24, 4/25 and finally 4/27 with placement of J tube, end ileostomy, abdominal closure.  Extubated 4/30.  NGT placed 5/14 due to possible ileus.    PT Comments    Patient still barely tolerating EOB activity with likely orthostatic BP though not able to get sitting BP.  Noted ongoing medical issues likely to need long term management in acute setting.  Updated d/c recommendations to [facility] as it may be awhile before he can tolerate inpatient rehab.  PT will continue to follow in acute setting.    Recommendations for follow up therapy are one component of a multi-disciplinary discharge planning process, led by the attending physician.  Recommendations may be updated based on patient status, additional functional criteria and insurance authorization.  Follow Up Recommendations       Assistance Recommended at Discharge Frequent or constant Supervision/Assistance  Patient can return home with the following Two people to help with walking and/or transfers;Assistance with cooking/housework;Direct supervision/assist for medications management;Assist for transportation;Help with stairs or ramp for entrance;Two people to help with bathing/dressing/bathroom   Equipment Recommendations  Other (comment) (TBA)     Recommendations for Other Services       Precautions / Restrictions Precautions Precautions: Fall Precaution Comments: multiple lines, JP drain, biliary tube, ileostomy; abdominal incision; NGT to suction     Mobility  Bed Mobility Overal bed mobility: Needs Assistance Bed Mobility: Supine to Sit   Sidelying to sit: HOB elevated, Max assist, +2 for physical assistance Supine to sit: Total assist, +2 for physical assistance     General bed mobility comments: +2 assist with initiation and to move legs off EOB and to lift trunk upright; unable to maintain for long, increased WOB noted with patient leaning to R, attempted BP in sitting but patient unable to tolerate with patient transitioned back to supine at total A of 2    Transfers                   General transfer comment: unable to tolerate today    Ambulation/Gait                   Stairs             Wheelchair Mobility    Modified Rankin (Stroke Patients Only)       Balance Overall balance assessment: Needs assistance Sitting-balance support: Feet supported, Bilateral upper extremity supported Sitting balance-Leahy Scale: Poor Sitting balance - Comments: pushing to lie back down so support to keep upright                                    Cognition Arousal/Alertness: Awake/alert Behavior During Therapy: Flat affect Overall Cognitive Status: Impaired/Different from baseline Area of Impairment: Attention, Following commands, Problem solving  Current Attention Level: Sustained   Following Commands: Follows one step commands with increased time Safety/Judgement: Decreased awareness of safety, Decreased awareness of deficits   Problem Solving: Slow processing, Decreased initiation, Requires tactile cues, Requires verbal cues General Comments: very slow to respond to commands and only whispering to communicate        Exercises       General Comments General comments (skin integrity, edema, etc.): Difficulty getting BP while seated EOB; BP 90/60 supine, 88/58 with HOB up; once return to supine and with leg cuff BP 112/74      Pertinent Vitals/Pain Pain Assessment Pain Assessment: Faces Faces Pain Scale: Hurts even more Pain Location: abdomen, generalized Pain Descriptors / Indicators: Discomfort, Grimacing Pain Intervention(s): Monitored during session, Limited activity within patient's tolerance    Home Living                          Prior Function            PT Goals (current goals can now be found in the care plan section) Progress towards PT goals: Not progressing toward goals - comment    Frequency    Min 3X/week      PT Plan Discharge plan needs to be updated    Co-evaluation   Reason for Co-Treatment: Complexity of the patient's impairments (multi-system involvement);For patient/therapist safety;To address functional/ADL transfers PT goals addressed during session: Mobility/safety with mobility;Balance;Strengthening/ROM        AM-PAC PT "6 Clicks" Mobility   Outcome Measure  Help needed turning from your back to your side while in a flat bed without using bedrails?: Total Help needed moving from lying on your back to sitting on the side of a flat bed without using bedrails?: Total Help needed moving to and from a bed to a chair (including a wheelchair)?: Total Help needed standing up from a chair using your arms (e.g., wheelchair or bedside chair)?: Total Help needed to walk in hospital room?: Total Help needed climbing 3-5 steps with a railing? : Total 6 Click Score: 6    End of Session   Activity Tolerance: Patient limited by fatigue Patient left: in bed;with call bell/phone within reach   PT Visit Diagnosis: Other abnormalities of gait and mobility (R26.89);Muscle weakness (generalized) (M62.81);Other symptoms and signs involving the nervous system (Z61.096)      Time: 0454-0981 PT Time Calculation (min) (ACUTE ONLY): 30 min  Charges:  $Therapeutic Activity: 8-22 mins                     Sheran Lawless, PT Acute Rehabilitation Services Office:[phone] 02/26/2023    Bradley Dominguez 02/26/2023, 5:06 PM

## 2023-02-26 NOTE — Progress Notes (Signed)
General Surgery Follow Up Note  Subjective:    Overnight Issues: Hgb stable. Creatinine 1.3, BUN and chloride continue to trend up.  Objective:  Vital signs for last 24 hours: Temp:  [97.5 F (36.4 C)-97.9 F (36.6 C)] 97.7 F (36.5 C) (05/22 0800) Pulse Rate:  [82-98] 91 (05/22 0900) Resp:  [12-24] 15 (05/22 0900) BP: (77-98)/(51-73) 87/57 (05/22 0900) SpO2:  [94 %-100 %] 99 % (05/22 0900) Weight:  [59.2 kg] 59.2 kg (05/22 0500)  Hemodynamic parameters for last 24 hours:    Intake/Output from previous day: 05/21 0701 - 05/22 0700 In: 2469.3 [I.V.:2364.4; IV Piggyback:105] Out: 3165 [Urine:2175; Emesis/NG output:700; Drains:280; Stool:10]  Intake/Output this shift: Total I/O In: 94.9 [I.V.:94.9] Out: 0   Vent settings for last 24 hours:    Physical Exam:  Gen: NAD, appears frail and ill Neuro: alert, follows commands HEENT: scleral icterus; NG in place draining gastric contents CV: RRR Pulm: nonlabored respirations on room air Abd: soft, nondistended, midline wound with fibrinous exudate, fascia in tact. LUQ JP with dark brown fluid. Duodenostomy to gravity with scant drainage. T tube bilious. Ileostomy with dark stool, no blood noted. Extr: wwp, no edema Skin: jaundiced GU: foley in place, dark urine  Results for orders placed or performed during the hospital encounter of 01/13/23 (from the past 24 hour(s))  Glucose, capillary     Status: None   Collection Time: 02/25/23 11:32 AM  Result Value Ref Range   Glucose-Capillary 97 70 - 99 mg/dL  Glucose, capillary     Status: Abnormal   Collection Time: 02/25/23  3:54 PM  Result Value Ref Range   Glucose-Capillary 108 (H) 70 - 99 mg/dL  Glucose, capillary     Status: Abnormal   Collection Time: 02/25/23  7:39 PM  Result Value Ref Range   Glucose-Capillary 102 (H) 70 - 99 mg/dL  Glucose, capillary     Status: Abnormal   Collection Time: 02/25/23 11:39 PM  Result Value Ref Range   Glucose-Capillary 114 (H) 70  - 99 mg/dL  Glucose, capillary     Status: Abnormal   Collection Time: 02/26/23  3:26 AM  Result Value Ref Range   Glucose-Capillary 117 (H) 70 - 99 mg/dL  CBC     Status: Abnormal   Collection Time: 02/26/23  6:33 AM  Result Value Ref Range   WBC 20.9 (H) 4.0 - 10.5 K/uL   RBC 3.55 (L) 4.22 - 5.81 MIL/uL   Hemoglobin 10.2 (L) 13.0 - 17.0 g/dL   HCT 16.1 (L) 09.6 - 04.5 %   MCV 84.5 80.0 - 100.0 fL   MCH 28.7 26.0 - 34.0 pg   MCHC 34.0 30.0 - 36.0 g/dL   RDW 40.9 (H) 81.1 - 91.4 %   Platelets 200 150 - 400 K/uL   nRBC 0.0 0.0 - 0.2 %  Basic metabolic panel     Status: Abnormal   Collection Time: 02/26/23  6:33 AM  Result Value Ref Range   Sodium 144 135 - 145 mmol/L   Potassium 4.4 3.5 - 5.1 mmol/L   Chloride 120 (H) 98 - 111 mmol/L   CO2 16 (L) 22 - 32 mmol/L   Glucose, Bld 121 (H) 70 - 99 mg/dL   BUN 782 (H) 8 - 23 mg/dL   Creatinine, Ser 9.56 (H) 0.61 - 1.24 mg/dL   Calcium 8.7 (L) 8.9 - 10.3 mg/dL   GFR, Estimated 59 (L) >60 mL/min   Anion gap 8 5 - 15  Magnesium     Status: None   Collection Time: 02/26/23  6:33 AM  Result Value Ref Range   Magnesium 2.4 1.7 - 2.4 mg/dL  Phosphorus     Status: Abnormal   Collection Time: 02/26/23  6:33 AM  Result Value Ref Range   Phosphorus 5.0 (H) 2.5 - 4.6 mg/dL  Glucose, capillary     Status: Abnormal   Collection Time: 02/26/23  7:37 AM  Result Value Ref Range   Glucose-Capillary 111 (H) 70 - 99 mg/dL    Assessment & Plan: The plan of care was discussed with the bedside nurse for the day, who is in agreement with this plan and no additional concerns were raised.   Present on Admission:  Bowel obstruction (HCC)  Protein-calorie malnutrition, severe (HCC)  Duodenal obstruction    LOS: 44 days   Additional comments:I reviewed the patient's new clinical lab test results.   and I reviewed the patients new imaging test results.    67 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  4/22 - Ex lap, adhesiolysis, duodenojejunal bypass,  duodenostomy tube placement, T tube placement in common bile duct. 4/23 - Takeback for bleeding with hemorrhagic shock, resection of ischemic transverse colon, left in discontinuity with Abthera 4/24 - Takeback for bleeding, abdominal washout, ileocecectomy, pack placement, placement of Abthera 4/25 - Takeback, resection of 10cm necrotic distal ileum, washout, removal of packs, placement of Abthera 4/27 - Takeback, placement of J tube, end ileostomy, abdominal closure   - Hyperbilirubinemia: Tube cholangiogram on 5/3 showed patency of the biliary tree with no obstruction. Likely multifactorial due to massive transfusion with hemolysis and shock liver. Bilirubin has peaked, anticipate will improve very slowly. Synthetic function is normal. Continue to trend LFTs twice weekly. - Keep T tube and D tube to gravity drainage. JP to bulb suction. Minimal output from D tube, which has been flushed and is patent. T tube is working.  - Pain: prn dilaudid - Wound care: Saline wet-to-dry to midline wound - FEN: NPO, ok for ice chips. Hold tube feeds given ileus and likely dysmotility. Continue full strength TPN.  NG decompression, NG output is high. - AKI: Creatinine improving, BUN trending up. Chloride and acidosis are worsening. Consult nephrology today. Will also give 1L crystalloid bolus (LR) to replace GI losses. - Urinary retention: Foley replaced on 5/20 for second time for urinary retention. Keep in place. - WOC following for ostomy care and teaching - PT/OT following. Patient is profoundly deconditioned. - GI bleeding: S/p 2u PRBCs on 5/19 with an appropriate response, hgb has since been stable. No signs of ongoing bleeding. - VTE: SCDs, lovenox on hold given recent GI bleeding. Plan to resume tomorrow if hgb remains stable and no melena in ileostomy. - Dispo: ICU  Sophronia Simas, MD [facility] Surgery General, Hepatobiliary and Pancreatic Surgery 02/26/23 9:22 AM

## 2023-02-26 NOTE — Progress Notes (Signed)
Pt became hypotensive 60s/40s. Dr. Freida Busman notified. 1000 cc LR bolus given. H/H sent, results pending. Will continue to monitor.

## 2023-02-26 NOTE — Progress Notes (Signed)
PHARMACY - TOTAL PARENTERAL NUTRITION CONSULT NOTE  Indication: Prolonged ileus, intolerance to enteral feeding  Patient Measurements: Height: 6\' 1"  (185.4 cm) Weight: 59.2 kg (130 lb 8.2 oz) IBW/kg (Calculated) : 79.9 TPN AdjBW (KG): 65.5 Body mass index is 17.22 kg/m.  Assessment: 57 yom with hx intestinal surgery and subsequent SBO requiring additional resection, admitted 01/13/23 with recurrent issues with SBO at the duodenum. Now s/p duodenal bypass complicated due to adhesions and resulted in small common bile duct injury requiring T-tube placement and external duodenostomy. Patient with massive bleed/hemorrhagic shock, s/p MTP. TPN initiated 4/9-5/6, then transitioned to TF. TF then held 5/14 given small bowel distension secondary to suspected ileus and likely dysmotility. Pharmacy consulted to resume TPN 5/16.  Glucose / Insulin: no hx DM - CBGs: 102-121, no SSI ordered   Electrolytes: Na 144 (Free water 30 Q12H for Jtube patency added), K 4.4 (goal >/= 4 for ileus), Cl: 120, HCO3: 16, Phos: 5.0 (removed 5/16), Mag: 2.4 (increased 5/20, goal > 2 for ileus), CoCa: 10.2 (decreased 5/20)  Renal: AKI resolving but still not at baseline - SCr 1.32 (BL ~0.7-0.9), BUN up to 105   Hepatic: 5/19: LFTs trend back up - AST 144/ALT 128, Tbili back up to 26.2 (peak ~30, noted scleral icterus), TG 130, albumin 2.1 Intake / Output; MIVF: UOP 1.5 ml/kg/hr, NGT output 700 ml, Biliary T-tube 280 ml, LUQ JP drain minimal output, duodenostomy 10mL; MIVF off  GI Imaging:  5/14 CT abd pelvis - feculent material throughout the SB/abdomen, suspicious for SB obstruction, bowel perforation/dehiscence, interval development of gas-containing fluid collection in R paracolic gutter 5/3 CT abdomen: bowel edema, no cause identified for T-bili elevation  5/3 cholangiogram: mild dilation of bilary tract   4/4 UGI: Findings suggestive of chronic pSBO or ileus 4/9 CT: Ileus vs high grade partial SBO GI Surgeries /  Procedures: 4/22 - Ex lap, adhesiolysis, duodenojejunal bypass, duodenostomy tube placement, T tube placement in common bile duct. 4/23 - Takeback for bleeding with hemorrhagic shock, resection of ischemic transverse colon, left in discontinuity with Abthera 4/24 - Takeback for bleeding, abdominal washout, ileocecectomy, pack placement, placement of Abthera 4/25 - Takeback, resection of 10cm necrotic distal ileum, washout, removal of packs, placement of Abthera 4/27 - Takeback, placement of J tube, end ileostomy, abdominal closure  Central access: triple lumen PICC placed 01/23/23 TPN start date: 01/14/23-02/10/23, 5/16>>  Nutritional Goals: Goal TPN rate is 95 mL/hr (provides 134 g of protein and 2307 kcals per day)  RD Estimated Needs Total Energy Estimated Needs: 2300-2600 Total Protein Estimated Needs: 130-145 grams Total Fluid Estimated Needs: 2.1L/day  Current Nutrition:  TPN NPO - ice chips   Plan:  Continue TPN at 95 ml/hr at 1800, providing 134g AA and 2307kcal meeting 100% of nutritional needs.  Electrolytes in TPN: Na 65mEq/L, K 35 mEq/L, Ca 58mEq/L, Mag 86mEq/L, Phos 71mmol/L since 5/16, max acetate.  Will not adjust magnesium today, elevation likely due to 2g given on 5/21 not TPN.  Unclear cause of hyperphos, has been out of TPN since 5/16.  Add standard MVI to TPN.  Remove standard trace elements with elevated Tbili and add back zinc 5mg , continue to hold chromium, and selenium. Spoke with [facility], nephrology to be consulted for worsening acidosis.  Monitor TPN labs standard Mon/Thurs, CMP tomorrow to assess liver function, will adjust TPN accordingly.  F/u CT abdomen on 5/22 PM, potential to resume some trickle feeds.   Estill Batten, PharmD, BCCCP  Clinical Pharmacist Please see Loretha Stapler  for all Pharmacists' Contact Phone Numbers 02/26/2023, 7:22 AM

## 2023-02-26 NOTE — Progress Notes (Signed)
Nutrition Follow-up  DOCUMENTATION CODES:   Severe malnutrition in context of chronic illness  INTERVENTION:   TPN to meet increased nutrition needs - discussed protein with pharmacy. Pt receiving 1.6 grams/kg of IBW. No benefit in decreasing due to elevated BUN  Once able to use J-tube recommend Pivot 1.5 with goal rate of 70 ml/hr  Provides: 2520 kcal, 157 grams protein, and 1276 ml free water   NUTRITION DIAGNOSIS:   Severe Malnutrition related to chronic illness as evidenced by moderate fat depletion, severe muscle depletion, energy intake < or equal to 75% for > or equal to 1 month, percent weight loss. Ongoing  GOAL:   Patient will meet greater than or equal to 90% of their needs Met with TPN at goal  MONITOR:   Labs, Weight trends, Skin, I & O's  REASON FOR ASSESSMENT:   Consult Enteral/tube feeding initiation and management (advance tube feeds to goal)  ASSESSMENT:   67 yo male who was admitted from home with worsening nausea, vomiting, bloating and weakness. Patient with remote history of multiple prior laparotomies (in the 1970's), with a chronic partial small bowel obstruction.  Spoke with pt and wife who was at bedside.  Plan for CT of abd  Nephrology consulted for elevated BUN/Cr Pt has lost more weight, pt without adequate nutrition x 10 days due to advancing TF which were then held and TPN held due to elevated Tbili.   04/08 - admitted, PICC line placed 04/09 - TPN initiated 04/13 - TPN increased to goal 04/14 - emergent EGD showing oozing gastric ulcer s/p epi and clips 04/15 - IR for emergent embolization, intubated 04/22 - s/p ex lap, LOA, duodenojejunal bypass with biliary T-tube, duodenostomy tube 04/23 - s/p second OR for ex lap with transverse colectomy, abd packed and open abd, left in discontinuity due to colonic ischemia, ligation of bleeding mesenteric vessels 04/24 - OR due to acute decompensation s/p ex lap with ileocectomy, temp abd  closure, MTP (did not receive TPN due to contaminated port) 04/25 - s/p ex lap, washout, SBR (10 cm necrotic distal ileum), VAC  04/27 - s/p ileostomy, J-tube placement, abd closure  04/30 - extubated  05/05 - TPN cycled  05/06 - TPN stopped due to elevated bilirubin 05/07 - trickle TF started via J-tube 05/09 - TF advancing to goal rate 05/14 - CT showing diffuse small bowel dilation and gastric distention, TF d/c, NG tube placed to LIWS 05/16 - TPN restarted   Admission weight: 68.1 kg  Current weight: 59.2 kg  Medications reviewed and include: 30 ml free water via J tube every 12 hours IV protonix 1L LR bolus x 1 Nabicarb @ 50 ml/hr  TPN via PICC @ 95 ml/hr 2307 kcal, 134 grams protein Standard MVI Standard trace elements removed, zinc added back in (chromium and selenium held)  Labs reviewed:  Cl 120   CO2 16 BUN 105 Phos 5.0 (no phos in TPN since 5/16) T bili: 26.2 (5/20) maxed at 31.3  UOP: 2175 ml x 24 hours NG tube: 700 ml x 24 hours Biliary T tube: 120 ml x 24 hours L abd JP: 70 ml x 24 hours 24 F RUQ: 90 ml x 24 hours Ileostomy: 10 ml x 24 hours 16 F J-tube: clamped with flush every 12 hours I/O's: -6.6 L since admit    Diet Order:   Diet Order             Diet NPO time specified Except for: Ice Chips  Diet effective now                   EDUCATION NEEDS:   Education needs have been addressed  Skin:  Skin Assessment: Skin Integrity Issues: Incisions: abd  Last BM:  5/21 10 ml via ileostomy  Height:   Ht Readings from Last 1 Encounters:  01/27/23 6\' 1"  (1.854 m)    Weight:   Wt Readings from Last 1 Encounters:  02/26/23 59.2 kg    BMI:  Body mass index is 17.22 kg/m.  Estimated Nutritional Needs:   Kcal:  2300 - 2600 - discussed with Pharmacy try to meet higher end of needs  Protein:  130-145 grams  Fluid:  2.1L/day   Quartez Lagos P., RD, LDN, CNSC See AMiON for contact information

## 2023-02-26 NOTE — Progress Notes (Signed)
Occupational Therapy Treatment Patient Details Name: Bradley Dominguez MRN: [id number] DOB: 09/23/1956 Today's Date: 02/26/2023   History of present illness 67 yo M HF, chronic partial small bowel obstruction and multiple previous abd surgeries  who was admitted  4/8 from home w n/v/bloating. Underwent EGD 4/11 -- concerning for duodenal obstruction. Developed hemorrhagic shock 4/15 in the setting of hematemesis / hematochezia, decompensated requiring, intubation, embolization of jejunal arcade branch. Extubated 01/22/23. Underwent ex lap adhesiolysis, duodenojejunal bypass, duodenostomy tube placement, T tube placement in common bile duct 4/22, transferred to [facility] 4/23 with hemorrhagic shock, return to OR 4/23, 4/24, 4/25 and finally 4/27 with placement of J tube, end ileostomy, abdominal closure.  Extubated 4/30.  NGT placed 5/14 due to possible ileus.   OT comments  Patient seen in conjunction with PT. Attempted to increase sitting tolerance, with patient with increased WOB sitting EOB, and PT and OT unable to get BP. BP still unable to read in arm when in supine, with BP assessed to be WNL on LLE when in supine (RN aware and BP cuff transitioned back to LUE) Patient returned to supine and placed in chair position. Education provided with regard to blue sponge to increase strength and AROM in bilateral hands. Discharge remains appropriate, OT will continue to follow.    Recommendations for follow up therapy are one component of a multi-disciplinary discharge planning process, led by the attending physician.  Recommendations may be updated based on patient status, additional functional criteria and insurance authorization.    Assistance Recommended at Discharge Frequent or constant Supervision/Assistance  Patient can return home with the following  A little help with walking and/or transfers;A little help with bathing/dressing/bathroom;Assistance with cooking/housework;Assist for transportation;Help with  stairs or ramp for entrance;Direct supervision/assist for medications management   Equipment Recommendations   (defer to next venue)    Recommendations for Other Services      Precautions / Restrictions Precautions Precautions: Fall Precaution Comments: multiple lines, JP drain, biliary tube, ileostomy; abdominal incision; NGT to suction       Mobility Bed Mobility Overal bed mobility: Needs Assistance Bed Mobility: Supine to Sit   Sidelying to sit: HOB elevated, Max assist, +2 for physical assistance Supine to sit: Mod assist     General bed mobility comments: +2 assist with initiation and to move legs off EOB and to lift trunk upright; unable to maintain for long, increased WOB noted with patient leaning to R, attempted BP in sitting but patient unable to tolerate with patient transitioned back to supine at total A of 2    Transfers                         Balance Overall balance assessment: Needs assistance Sitting-balance support: Feet supported, Bilateral upper extremity supported Sitting balance-Leahy Scale: Fair                                     ADL either performed or assessed with clinical judgement   ADL Overall ADL's : Needs assistance/impaired Eating/Feeding: NPO (ice chips)                       Toilet Transfer: Maximal assistance;+2 for physical assistance;+2 for safety/equipment           Functional mobility during ADLs: Maximal assistance;+2 for physical assistance;+2 for safety/equipment;Cueing for safety;Cueing for sequencing General ADL Comments: Attempted  to increase sitting tolerance, with patient with increased WOB sitting EOB, and PT and OT unable to get BP. Patient returned to supine and placed in chair position. Education provided with regard to blue sponge to increase strength and AROM in bilateral hands.    Extremity/Trunk Assessment              Vision       Perception     Praxis       Cognition Arousal/Alertness: Awake/alert Behavior During Therapy: Flat affect Overall Cognitive Status: Impaired/Different from baseline Area of Impairment: Attention, Following commands                   Current Attention Level: Sustained   Following Commands: Follows one step commands with increased time Safety/Judgement: Decreased awareness of safety, Decreased awareness of deficits   Problem Solving: Slow processing, Decreased initiation, Requires tactile cues, Requires verbal cues General Comments: very slow to respond to commands and only whispering to communicate        Exercises      Shoulder Instructions       General Comments      Pertinent Vitals/ Pain       Pain Assessment Pain Assessment: Faces Faces Pain Scale: Hurts even more Pain Location: abdomen, generalized Pain Descriptors / Indicators: Discomfort, Grimacing Pain Intervention(s): Limited activity within patient's tolerance, Monitored during session, Repositioned  Home Living                                          Prior Functioning/Environment              Frequency  Min 2X/week        Progress Toward Goals  OT Goals(current goals can now be found in the care plan section)     Acute Rehab OT Goals Patient Stated Goal: to get stronger OT Goal Formulation: With family Time For Goal Achievement: 03/06/23 Potential to Achieve Goals: Good  Plan Discharge plan remains appropriate    Co-evaluation      Reason for Co-Treatment: Complexity of the patient's impairments (multi-system involvement);For patient/therapist safety;To address functional/ADL transfers PT goals addressed during session: Mobility/safety with mobility;Balance;Strengthening/ROM        AM-PAC OT "6 Clicks" Daily Activity     Outcome Measure   Help from another person eating meals?: Total (NPO) Help from another person taking care of personal grooming?: A Lot Help from another person  toileting, which includes using toliet, bedpan, or urinal?: A Lot Help from another person bathing (including washing, rinsing, drying)?: A Lot Help from another person to put on and taking off regular upper body clothing?: A Lot Help from another person to put on and taking off regular lower body clothing?: A Lot 6 Click Score: 11    End of Session    OT Visit Diagnosis: Unsteadiness on feet (R26.81);Other abnormalities of gait and mobility (R26.89);Muscle weakness (generalized) (M62.81);Other symptoms and signs involving cognitive function;Pain   Activity Tolerance Patient limited by fatigue;Patient limited by pain;Other (comment) (likely low BP sitting EOB, unable to get reading)   Patient Left in bed;with call bell/phone within reach;with nursing/sitter in room;with family/visitor present;with bed alarm set   Nurse Communication Mobility status        Time: 1610-9604 OT Time Calculation (min): 30 min  Charges: OT General Charges $OT Visit: 1 Visit OT Treatments $Self Care/Home Management : 8-22  mins  Pollyann Glen E. Shadie Sweatman, OTR/L Acute Rehabilitation Services [phone]   Cherlyn Cushing 02/26/2023, 4:39 PM

## 2023-02-26 NOTE — Progress Notes (Signed)
MAP goal >60 per Dr. Freida Busman.

## 2023-02-26 NOTE — Consult Note (Signed)
[facility] Renal Consultation Note  Requesting MD: Dr. Freida Busman Indication for Consultation: Azotemia  HPI:  Bradley Dominguez is a 67 y.o. male with prior history of multiple prior laparotomies and chronic partial bowel obstruction initially admitted 4/8 with small bowel obstruction. 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 Patient has been on TPN most recently from 5/16 through today. Since that time, creatinine has dropped from 2>1.3, BUN has risen to 105, bicarb has been stable 15-16, chloride has mildly increased. Given the rise in BUN we were asked to consult for further recommendations. Patient had recently received IV pain medication on my exam, therefore patient's wife supplemented much of the recent history. She reports patient has been very thirsty and hungry and feels like he needs more protein. Denies any more noticeable bleeding, including hematemesis.   Creatinine, Ser  Date/Time Value Ref Range Status  02/26/2023 06:33 AM 1.32 (H) 0.61 - 1.24 mg/dL Final    Comment:    ICTERUS AT THIS LEVEL MAY AFFECT RESULT  02/25/2023 04:21 AM 1.34 (H) 0.61 - 1.24 mg/dL Final    Comment:    ICTERUS AT THIS LEVEL MAY AFFECT RESULT  02/24/2023 05:40 AM 1.53 (H) 0.61 - 1.24 mg/dL Final    Comment:    ICTERUS AT THIS LEVEL MAY AFFECT RESULT  02/23/2023 05:13 AM 1.56 (H) 0.61 - 1.24 mg/dL Final    Comment:    ICTERUS AT THIS LEVEL MAY AFFECT RESULT  02/22/2023 08:41 PM 1.63 (H) 0.61 - 1.24  mg/dL Final    Comment:    ICTERUS AT THIS LEVEL MAY AFFECT RESULT  02/22/2023 06:30 AM 1.53 (H) 0.61 - 1.24 mg/dL Final    Comment:    ICTERUS AT THIS LEVEL MAY AFFECT RESULT  02/21/2023 05:30 AM 1.89 (H) 0.61 - 1.24 mg/dL Final    Comment:    ICTERUS AT THIS LEVEL MAY AFFECT RESULT  02/20/2023 08:34 AM 2.01 (H) 0.61 - 1.24 mg/dL Final    Comment:    ICTERUS AT THIS LEVEL MAY AFFECT RESULT  02/19/2023 05:15 AM 2.38 (H) 0.61 - 1.24 mg/dL Final    Comment:    ICTERUS AT THIS LEVEL MAY AFFECT RESULT  02/18/2023 07:20 AM 2.26 (H) 0.61 - 1.24 mg/dL Final  16/07/9603 54:09 PM 2.43 (H) 0.61 - 1.24 mg/dL Final    Comment:    ICTERUS AT THIS LEVEL MAY AFFECT RESULT  02/17/2023 04:30 AM 2.36 (H) 0.61 - 1.24 mg/dL Final    Comment:    ICTERUS AT THIS LEVEL MAY AFFECT RESULT  02/16/2023 03:16 AM 2.15 (H) 0.61 - 1.24 mg/dL Final    Comment:    ICTERUS AT THIS LEVEL MAY AFFECT RESULT  02/15/2023 03:52 AM 1.85 (H) 0.61 - 1.24 mg/dL Final    Comment:    ICTERUS AT THIS LEVEL MAY AFFECT RESULT  02/13/2023 05:00 AM 0.86 0.61 - 1.24 mg/dL Final    Comment:    ICTERUS AT THIS LEVEL MAY AFFECT RESULT  02/12/2023 04:12 AM 0.73 0.61 - 1.24 mg/dL Final    Comment:    ICTERUS AT THIS LEVEL MAY AFFECT RESULT  02/10/2023 05:12 AM 0.71 0.61 -  1.24 mg/dL Final    Comment:    ICTERUS AT THIS LEVEL MAY AFFECT RESULT  02/09/2023 06:19 AM 0.73 0.61 - 1.24 mg/dL Final    Comment:    ICTERUS AT THIS LEVEL MAY AFFECT RESULT  02/08/2023 04:21 AM 0.91 0.61 - 1.24 mg/dL Final  16/07/9603 54:09 AM 1.36 (H) 0.61 - 1.24 mg/dL Final  81/19/1478 29:56 AM 1.33 (H) 0.61 - 1.24 mg/dL Final  21/30/8657 84:69 AM 1.07 0.61 - 1.24 mg/dL Final  62/95/2841 32:44 AM 0.80 0.61 - 1.24 mg/dL Final  10/09/7251 66:44 AM 0.87 0.61 - 1.24 mg/dL Final  03/47/4259 56:38 AM 1.06 0.61 - 1.24 mg/dL Final  75/64/3329 51:88 AM 1.17 0.61 - 1.24 mg/dL Final  41/66/0630 16:01 AM 1.38 (H) 0.61 - 1.24 mg/dL Final  09/32/3557 32:20 AM 1.55  (H) 0.61 - 1.24 mg/dL Final  25/42/7062 37:62 PM 1.45 (H) 0.61 - 1.24 mg/dL Final  83/15/1761 60:73 AM 1.44 (H) 0.61 - 1.24 mg/dL Final  71/03/2693 85:46 PM 1.52 (H) 0.61 - 1.24 mg/dL Final  27/12/5007 38:18 AM 1.31 (H) 0.61 - 1.24 mg/dL Final  29/93/7169 67:89 AM 1.40 (H) 0.61 - 1.24 mg/dL Final  38/07/1750 02:58 PM 1.24 0.61 - 1.24 mg/dL Final  52/77/8242 35:36 PM 1.13 0.61 - 1.24 mg/dL Final  14/43/1540 08:67 AM 1.38 (H) 0.61 - 1.24 mg/dL Final  61/95/0932 67:12 AM 1.22 0.61 - 1.24 mg/dL Final  45/80/9983 38:25 PM 1.10 0.61 - 1.24 mg/dL Final  05/39/7673 41:93 PM 1.01 0.61 - 1.24 mg/dL Final  79/11/4095 35:32 PM 0.97 0.61 - 1.24 mg/dL Final  99/24/2683 41:96 PM 0.60 (L) 0.61 - 1.24 mg/dL Final  22/29/7989 21:19 PM 0.70 0.61 - 1.24 mg/dL Final  41/74/0814 48:18 AM 0.79 0.61 - 1.24 mg/dL Final  56/31/4970 26:37 AM 0.80 0.61 - 1.24 mg/dL Final  85/88/5027 74:12 AM 0.82 0.61 - 1.24 mg/dL Final  87/86/7672 09:47 AM 0.89 0.61 - 1.24 mg/dL Final  09/62/8366 29:47 AM 0.96 0.61 - 1.24 mg/dL Final  65/46/5035 46:56 AM 0.85 0.61 - 1.24 mg/dL Final  81/27/5170 01:74 AM 0.80 0.61 - 1.24 mg/dL Final  94/49/6759 16:38 PM 0.84 0.61 - 1.24 mg/dL Final  46/65/9935 70:17 AM 0.83 0.61 - 1.24 mg/dL Final  79/39/0300 92:33 AM 0.99 0.61 - 1.24 mg/dL Final   PMHx:   Past Medical History:  Diagnosis Date   Arthritis    HANDS   Bowel obstruction (HCC)    CHF (congestive heart failure) (HCC) 11/26/2018   Elevated brain natriuretic peptide (BNP) level 11/25/2018   Family history of adverse reaction to anesthesia    FATHER SLOW TO AWAKEN, MOTHER POST OP N/V   Frequent UTI    History of colon polyps 2009   History of shingles    Plantar fasciitis    Prostate hypertrophy    Vitamin B 12 deficiency    Past Surgical History:  Procedure Laterality Date   APPENDECTOMY     APPLICATION OF WOUND VAC  01/29/2023   Procedure: APPLICATION OF WOUND VAC;  Surgeon: Fritzi Mandes, MD;  Location: [facility] OR;   Service: General;;   BIOPSY  01/16/2023   Procedure: BIOPSY;  Surgeon: Lynann Bologna, MD;  Location: [facility];  Service: Gastroenterology;;   BOWEL RESECTION N/A 01/30/2023   Procedure: SMALL BOWEL RESECTION;  Surgeon: Fritzi Mandes, MD;  Location: [facility] OR;  Service: General;  Laterality: N/A;   CECOSTOMY N/A 01/29/2023   Procedure: Meliton Rattan;  Surgeon: Fritzi Mandes, MD;  Location: [facility] OR;  Service: General;  Laterality: N/A;   CHOLECYSTECTOMY     COLONSCOPY  AGE 56   WITH POLYP REMOVED   ESOPHAGOGASTRODUODENOSCOPY N/A 01/19/2023   Procedure: ESOPHAGOGASTRODUODENOSCOPY (EGD);  Surgeon: Lynann Bologna, MD;  Location: [facility];  Service: Gastroenterology;  Laterality: N/A;   ESOPHAGOGASTRODUODENOSCOPY (EGD) WITH PROPOFOL N/A 01/16/2023   Procedure: ESOPHAGOGASTRODUODENOSCOPY (EGD) WITH PROPOFOL;  Surgeon: Lynann Bologna, MD;  Location: [facility];  Service: Gastroenterology;  Laterality: N/A;   GASTROSTOMY  02/01/2023   Procedure: INSERTION OF JEJUNOSTOMY TUBE;  Surgeon: Fritzi Mandes, MD;  Location[facility] OR;  Service: General;;   HEMOSTASIS CLIP PLACEMENT  01/19/2023   Procedure: HEMOSTASIS CLIP PLACEMENT;  Surgeon: Lynann Bologna, MD;  Location: [facility];  Service: Gastroenterology;;   HEMOSTASIS CONTROL  01/19/2023   Procedure: HEMOSTASIS CONTROL;  Surgeon: Lynann Bologna, MD;  Location: [facility];  Service: Gastroenterology;;   IR ANGIOGRAM SELECTIVE EACH ADDITIONAL VESSEL  01/20/2023   IR ANGIOGRAM SELECTIVE EACH ADDITIONAL VESSEL  01/20/2023   IR ANGIOGRAM SELECTIVE EACH ADDITIONAL VESSEL  01/20/2023   IR ANGIOGRAM VISCERAL SELECTIVE  01/20/2023   IR ANGIOGRAM VISCERAL SELECTIVE  01/20/2023   IR CHEST FLUORO  01/20/2023   IR EMBO ART  VEN HEMORR LYMPH EXTRAV  INC GUIDE ROADMAPPING  01/20/2023   IR FLUORO GUIDE CV LINE RIGHT  01/20/2023   IR US GUIDE VASC ACCESS RIGHT  01/20/2023   IR US GUIDE VASC ACCESS RIGHT  01/20/2023   LAPAROSCOPIC ABDOMINAL EXPLORATION N/A 01/29/2023    Procedure: ReExploration Laparotomy, Removal of Abdominal Packing, Repacked Abdomen Packing;  Surgeon: Fritzi Mandes, MD;  Location: [facility];  Service: General;  Laterality: N/A;   LAPAROTOMY N/A 01/27/2023   Procedure: EXPLORATORY LAPAROTOMY, LYSIS OF ADHESIONS, DUODENOJEJUNAL BYPASS, PLACEMENT OF BILIARY T-TUBE, PLACEMENT OF DUODENOSTOMY TUBE;  Surgeon: Fritzi Mandes, MD;  Location: [facility];  Service: General;  Laterality: N/A;   LAPAROTOMY N/A 01/28/2023   Procedure: EXPLORATORY LAPAROTOMY, PARTIAL BOWEL RESECTION, AND ABTHERA WOUND VAC APPLICATION;  Surgeon: Fritzi Mandes, MD;  Location: [facility];  Service: General;  Laterality: N/A;  EIGHT 18X18 LAPS LEFT INSIDE WOUND SITE   LAPAROTOMY N/A 01/29/2023   Procedure: OPEN ABDOMINAL WASHOUT;  Surgeon: Fritzi Mandes, MD;  Location: [facility];  Service: General;  Laterality: N/A;   LAPAROTOMY N/A 01/30/2023   Procedure: REOPENING OF LAPAROTOMY, ABDOMINAL WASHOUT;  Surgeon: Fritzi Mandes, MD;  Location: [facility];  Service: General;  Laterality: N/A;   LAPAROTOMY N/A 02/01/2023   Procedure: RE-OPENING  OF LAPAROTOMY, ABDOMINAL WASHOUT, ABDOMINAL CLOSURE, ILEOSTOMY PLACEMENT;  Surgeon: Fritzi Mandes, MD;  Location: [facility];  Service: General;  Laterality: N/A;   SMALL INTESTINE SURGERY     1979   SUBMUCOSAL INJECTION  01/19/2023   Procedure: SUBMUCOSAL INJECTION;  Surgeon: Lynann Bologna, MD;  Location: [facility];  Service: Gastroenterology;;   TRANSURETHRAL RESECTION OF PROSTATE N/A 09/22/2017   Procedure: TRANSURETHRAL RESECTION OF THE PROSTATE (TURP);  Surgeon: Malen Gauze, MD;  Location: [facility];  Service: Urology;  Laterality: N/A;   Family Hx:  Family History  Problem Relation Age of Onset   Breast cancer Mother    Heart attack Father    Diabetes Other    Colon cancer Neg Hx    Esophageal cancer Neg Hx    Stomach cancer Neg Hx    Colon polyps Neg Hx    Rectal cancer Neg Hx    Social History:  reports that he has  never smoked.  He has never used smokeless tobacco. He reports that he does not drink alcohol and does not use drugs.  Allergies: No Known Allergies  Medications: Prior to Admission medications   Medication Sig Start Date End Date Taking? Authorizing Provider  acetaminophen (TYLENOL) 500 MG tablet Take 1,000 mg by mouth as needed for moderate pain.   Yes [provider]  aspirin-acetaminophen-caffeine (EXCEDRIN MIGRAINE) [id number] MG tablet Take 1 tablet by mouth as needed for headache or migraine.   Yes [provider]  cyanocobalamin 2000 MCG tablet Take 2,000 mcg by mouth daily.   Yes [provider]  Lactobacillus (FLORAJEN ACIDOPHILUS PO) Take 1 capsule by mouth daily.   Yes [provider]  ondansetron (ZOFRAN-ODT) 8 MG disintegrating tablet Take 8 mg by mouth every 8 (eight) hours as needed for vomiting or nausea. 01/10/23  Yes [provider]  pantoprazole (PROTONIX) 40 MG tablet Take by mouth. Patient not taking: Reported on 01/14/2023 12/09/22 12/09/23  [provider]    I have reviewed the patient's current medications.  Labs:  Results for orders placed or performed during the hospital encounter of 01/13/23 (from the past 48 hour(s))  Hemoglobin and hematocrit, blood     Status: Abnormal   Collection Time: 02/24/23  3:41 PM  Result Value Ref Range   Hemoglobin 9.6 (L) 13.0 - 17.0 g/dL   HCT 54.0 (L) 98.1 - 19.1 %    Comment: Performed at [facility], [address] [place], [place] [address]  Glucose, capillary     Status: None   Collection Time: 02/24/23  3:54 PM  Result Value Ref Range   Glucose-Capillary 93 70 - 99 mg/dL    Comment: Glucose reference range applies only to samples taken after fasting for at least 8 hours.  Glucose, capillary     Status: Abnormal   Collection Time: 02/24/23  7:40 PM  Result Value Ref Range   Glucose-Capillary 108 (H) 70 - 99 mg/dL    Comment: Glucose reference range applies only to  samples taken after fasting for at least 8 hours.  Glucose, capillary     Status: Abnormal   Collection Time: 02/24/23 11:36 PM  Result Value Ref Range   Glucose-Capillary 101 (H) 70 - 99 mg/dL    Comment: Glucose reference range applies only to samples taken after fasting for at least 8 hours.  Glucose, capillary     Status: Abnormal   Collection Time: 02/25/23  3:36 AM  Result Value Ref Range   Glucose-Capillary 114 (H) 70 - 99 mg/dL    Comment: Glucose reference range applies only to samples taken after fasting for at least 8 hours.  CBC     Status: Abnormal   Collection Time: 02/25/23  4:21 AM  Result Value Ref Range   WBC 21.3 (H) 4.0 - 10.5 K/uL   RBC 3.52 (L) 4.22 - 5.81 MIL/uL   Hemoglobin 10.1 (L) 13.0 - 17.0 g/dL   HCT 56.2 (L) 13.0 - 86.5 %   MCV 83.0 80.0 - 100.0 fL   MCH 28.7 26.0 - 34.0 pg   MCHC 34.6 30.0 - 36.0 g/dL   RDW 78.4 (H) 69.6 - 29.5 %   Platelets 176 150 - 400 K/uL    Comment: REPEATED TO VERIFY   nRBC 0.0 0.0 - 0.2 %    Comment: Performed at [facility], [address], [place], [place] [address]  Basic metabolic panel     Status: Abnormal  Collection Time: 02/25/23  4:21 AM  Result Value Ref Range   Sodium 144 135 - 145 mmol/L   Potassium 4.4 3.5 - 5.1 mmol/L   Chloride 118 (H) 98 - 111 mmol/L   CO2 17 (L) 22 - 32 mmol/L   Glucose, Bld 128 (H) 70 - 99 mg/dL    Comment: Glucose reference range applies only to samples taken after fasting for at least 8 hours.   BUN 96 (H) 8 - 23 mg/dL   Creatinine, Ser 0.98 (H) 0.61 - 1.24 mg/dL    Comment: ICTERUS AT THIS LEVEL MAY AFFECT RESULT   Calcium 8.8 (L) 8.9 - 10.3 mg/dL   GFR, Estimated 58 (L) >60 mL/min    Comment: (NOTE) Calculated using the CKD-EPI Creatinine Equation (2021)    Anion gap 9 5 - 15    Comment: Performed at [facility], [address], [place], [place] [address]  Phosphorus     Status: None   Collection Time: 02/25/23  4:21 AM  Result Value Ref Range   Phosphorus 4.4  2.5 - 4.6 mg/dL    Comment: Performed at [facility], [address], [place], [place] [address]  Magnesium     Status: None   Collection Time: 02/25/23  4:21 AM  Result Value Ref Range   Magnesium 1.8 1.7 - 2.4 mg/dL    Comment: Performed at [facility], [address], [place], [place] [address]  Glucose, capillary     Status: None   Collection Time: 02/25/23  7:38 AM  Result Value Ref Range   Glucose-Capillary 98 70 - 99 mg/dL    Comment: Glucose reference range applies only to samples taken after fasting for at least 8 hours.  Glucose, capillary     Status: None   Collection Time: 02/25/23 11:32 AM  Result Value Ref Range   Glucose-Capillary 97 70 - 99 mg/dL    Comment: Glucose reference range applies only to samples taken after fasting for at least 8 hours.  Glucose, capillary     Status: Abnormal   Collection Time: 02/25/23  3:54 PM  Result Value Ref Range   Glucose-Capillary 108 (H) 70 - 99 mg/dL    Comment: Glucose reference range applies only to samples taken after fasting for at least 8 hours.  Glucose, capillary     Status: Abnormal   Collection Time: 02/25/23  7:39 PM  Result Value Ref Range   Glucose-Capillary 102 (H) 70 - 99 mg/dL    Comment: Glucose reference range applies only to samples taken after fasting for at least 8 hours.  Glucose, capillary     Status: Abnormal   Collection Time: 02/25/23 11:39 PM  Result Value Ref Range   Glucose-Capillary 114 (H) 70 - 99 mg/dL    Comment: Glucose reference range applies only to samples taken after fasting for at least 8 hours.  Glucose, capillary     Status: Abnormal   Collection Time: 02/26/23  3:26 AM  Result Value Ref Range   Glucose-Capillary 117 (H) 70 - 99 mg/dL    Comment: Glucose reference range applies only to samples taken after fasting for at least 8 hours.  CBC     Status: Abnormal   Collection Time: 02/26/23  6:33 AM  Result Value Ref Range   WBC 20.9 (H) 4.0 - 10.5 K/uL   RBC 3.55 (L) 4.22  - 5.81 MIL/uL   Hemoglobin 10.2 (L) 13.0 - 17.0 g/dL   HCT  30.0 (L) 39.0 - 52.0 %   MCV 84.5 80.0 - 100.0 fL   MCH 28.7 26.0 - 34.0 pg   MCHC 34.0 30.0 - 36.0 g/dL   RDW 16.1 (H) 09.6 - 04.5 %   Platelets 200 150 - 400 K/uL   nRBC 0.0 0.0 - 0.2 %    Comment: Performed at [facility], [address], [place], [place] [address]  Basic metabolic panel     Status: Abnormal   Collection Time: 02/26/23  6:33 AM  Result Value Ref Range   Sodium 144 135 - 145 mmol/L   Potassium 4.4 3.5 - 5.1 mmol/L   Chloride 120 (H) 98 - 111 mmol/L   CO2 16 (L) 22 - 32 mmol/L   Glucose, Bld 121 (H) 70 - 99 mg/dL    Comment: Glucose reference range applies only to samples taken after fasting for at least 8 hours.   BUN 105 (H) 8 - 23 mg/dL   Creatinine, Ser 1.91 (H) 0.61 - 1.24 mg/dL    Comment: ICTERUS AT THIS LEVEL MAY AFFECT RESULT   Calcium 8.7 (L) 8.9 - 10.3 mg/dL   GFR, Estimated 59 (L) >60 mL/min    Comment: (NOTE) Calculated using the CKD-EPI Creatinine Equation (2021)    Anion gap 8 5 - 15    Comment: Performed at [facility], [address], [place], [place] [address]  Magnesium     Status: None   Collection Time: 02/26/23  6:33 AM  Result Value Ref Range   Magnesium 2.4 1.7 - 2.4 mg/dL    Comment: Performed at [facility], [address], [place], [place] [address]  Phosphorus     Status: Abnormal   Collection Time: 02/26/23  6:33 AM  Result Value Ref Range   Phosphorus 5.0 (H) 2.5 - 4.6 mg/dL    Comment: Performed at [facility], [address], [place], [place] [address]  Glucose, capillary     Status: Abnormal   Collection Time: 02/26/23  7:37 AM  Result Value Ref Range   Glucose-Capillary 111 (H) 70 - 99 mg/dL    Comment: Glucose reference range applies only to samples taken after fasting for at least 8 hours.     ROS:  Pertinent items are noted in HPI.  Physical Exam: Vitals:   02/26/23 1100 02/26/23 1200  BP: (!) 84/57 (!) 91/56  Pulse: 88 89   Resp: 11 18  Temp:    SpO2: 100% 100%     General: Chronically ill-appearing, cachectic person laying in bed in no acute distress HEENT: Normocephalic, atraumatic. Dry mucous membranes. NGT in place. Neck: Supple, no JVD Heart: Regular rate, rhythm. No murmurs appreciated.  Lungs: Normal work of breathing on room air. Clear to auscultation bilaterally.  Abdomen: Ileostomy with stool in place. Large surgical bandage. Multiple drains in RUQ, LUQ present.  Extremities: Sarcopenic. No peripheral edema.  Skin: Warm, dry. Quite jaundiced.  Neuro: Awake, lethargic.   Assessment/Plan: Azotemia with hyperchloremic non-anion gap metabolic acidosis: Patient is having good urine output, GFR is also stable. Patient is quite sarcopenic on exam and very deconditioned, likely in setting of poor nutritional status and long hospitalization. He is not having any current GI bleeding, Hgb has been stable. No recent steroid use. With current TPN per last RD note patient is getting roughly 2.2g/kg protein, which could be raising BUN due to catabolic state. Can discuss with RD to adjust protein levels. Patient is also  having decent output through NGT, which could be contributing to his acidosis. Could consider giving an amp of sodium bicarb given he is NPO. Will discuss with attending.  Hyperbilirubinemia: Throughout to be multifactorial due to shock liver, massive transfusion protocol with hemolysis. T-tube in place, management per CCS. SBO s/p multiple laparotomies, adhesion breakdowns, ileostomy placement: Currently NPO and having decent NGT output. Per primary.  GI bleeding 2/2 gastric ulcer, ruptured jejunal pseudoaneurysm: Hgb stable, does not appear to be in any acute bleeding. Management per GI, primary. Urinary retention: Foley in place.  Evlyn Kanner 02/26/2023, 12:08 PM

## 2023-02-27 ENCOUNTER — Inpatient Hospital Stay (HOSPITAL_COMMUNITY): Payer: BC Managed Care – PPO

## 2023-02-27 LAB — PHOSPHORUS: Phosphorus: 4.8 mg/dL — ABNORMAL HIGH (ref 2.5–4.6)

## 2023-02-27 LAB — GLUCOSE, CAPILLARY
Glucose-Capillary: 112 mg/dL — ABNORMAL HIGH (ref 70–99)
Glucose-Capillary: 112 mg/dL — ABNORMAL HIGH (ref 70–99)
Glucose-Capillary: 107 mg/dL — ABNORMAL HIGH (ref 70–99)
Glucose-Capillary: 130 mg/dL — ABNORMAL HIGH (ref 70–99)
Glucose-Capillary: 115 mg/dL — ABNORMAL HIGH (ref 70–99)

## 2023-02-27 LAB — COMPREHENSIVE METABOLIC PANEL
ALT: 228 U/L — ABNORMAL HIGH (ref 0–44)
AST: 257 U/L — ABNORMAL HIGH (ref 15–41)
Albumin: 1.7 g/dL — ABNORMAL LOW (ref 3.5–5.0)
Alkaline Phosphatase: 150 U/L — ABNORMAL HIGH (ref 38–126)
Anion gap: 9 (ref 5–15)
BUN: 111 mg/dL — ABNORMAL HIGH (ref 8–23)
CO2: 19 mmol/L — ABNORMAL LOW (ref 22–32)
Calcium: 8.2 mg/dL — ABNORMAL LOW (ref 8.9–10.3)
Chloride: 118 mmol/L — ABNORMAL HIGH (ref 98–111)
Creatinine, Ser: 1.27 mg/dL — ABNORMAL HIGH (ref 0.61–1.24)
GFR, Estimated: >60 mL/min (ref >60)
Glucose, Bld: 134 mg/dL — ABNORMAL HIGH (ref 70–99)
Potassium: 4.5 mmol/L (ref 3.5–5.1)
Sodium: 146 mmol/L — ABNORMAL HIGH (ref 135–145)
Total Bilirubin: 24.1 mg/dL (ref 0.3–1.2)
Total Protein: 5.7 g/dL — ABNORMAL LOW (ref 6.5–8.1)

## 2023-02-27 LAB — HEMOGLOBIN AND HEMATOCRIT, BLOOD
HCT: 25 % — ABNORMAL LOW (ref 39.0–52.0)
HCT: 24.4 % — ABNORMAL LOW (ref 39.0–52.0)
Hemoglobin: 8.5 g/dL — ABNORMAL LOW (ref 13.0–17.0)
Hemoglobin: 8.5 g/dL — ABNORMAL LOW (ref 13.0–17.0)

## 2023-02-27 LAB — CBC
HCT: 25.2 % — ABNORMAL LOW (ref 39.0–52.0)
Hemoglobin: 8.5 g/dL — ABNORMAL LOW (ref 13.0–17.0)
MCH: 29.4 pg (ref 26.0–34.0)
MCHC: 33.7 g/dL (ref 30.0–36.0)
MCV: 87.2 fL (ref 80.0–100.0)
Platelets: 191 10*3/uL (ref 150–400)
RBC: 2.89 MIL/uL — ABNORMAL LOW (ref 4.22–5.81)
RDW: 23.9 % — ABNORMAL HIGH (ref 11.5–15.5)
WBC: 21 10*3/uL — ABNORMAL HIGH (ref 4.0–10.5)
nRBC: 0 % (ref 0.0–0.2)

## 2023-02-27 LAB — MAGNESIUM: Magnesium: 2.2 mg/dL (ref 1.7–2.4)

## 2023-02-27 MED ORDER — ZINC CHLORIDE 1 MG/ML IV SOLN
INTRAVENOUS | Status: AC
  Filled 2023-02-27: qty 1000.9

## 2023-02-27 MED ORDER — HYDROMORPHONE HCL 1 MG/ML IJ SOLN
0.2500 mg | INTRAMUSCULAR | Status: DC | PRN
  Administered 2023-02-27 – 2023-02-28 (×2): 0.5 mg via INTRAVENOUS
  Administered 2023-02-28: 0.25 mg via INTRAVENOUS
  Administered 2023-02-28: 0.5 mg via INTRAVENOUS
  Administered 2023-02-28: 0.25 mg via INTRAVENOUS
  Administered 2023-02-28: 0.5 mg via INTRAVENOUS
  Administered 2023-02-28 (×3): 0.25 mg via INTRAVENOUS
  Administered 2023-02-28: 0.5 mg via INTRAVENOUS
  Administered 2023-03-01 (×3): 0.25 mg via INTRAVENOUS
  Administered 2023-03-01: 0.5 mg via INTRAVENOUS
  Administered 2023-03-01: 0.25 mg via INTRAVENOUS
  Administered 2023-03-02 (×3): 0.5 mg via INTRAVENOUS
  Administered 2023-03-02 (×2): 0.25 mg via INTRAVENOUS
  Administered 2023-03-03: 0.5 mg via INTRAVENOUS
  Administered 2023-03-03 (×3): 0.25 mg via INTRAVENOUS
  Administered 2023-03-03: 0.5 mg via INTRAVENOUS
  Administered 2023-03-04: 0.25 mg via INTRAVENOUS
  Administered 2023-03-04: 0.5 mg via INTRAVENOUS
  Administered 2023-03-04: 0.25 mg via INTRAVENOUS
  Administered 2023-03-04: 0.5 mg via INTRAVENOUS
  Administered 2023-03-04: 0.25 mg via INTRAVENOUS
  Administered 2023-03-04: 0.5 mg via INTRAVENOUS
  Administered 2023-03-04 – 2023-03-05 (×3): 0.25 mg via INTRAVENOUS
  Filled 2023-02-27 (×34): qty 1

## 2023-02-27 MED ORDER — PIVOT 1.5 CAL PO LIQD
1000.0000 mL | ORAL | Status: DC
  Administered 2023-02-27 – 2023-02-28 (×2): 1000 mL

## 2023-02-27 MED ORDER — HYDROMORPHONE HCL 1 MG/ML IJ SOLN
0.2500 mg | INTRAMUSCULAR | Status: DC | PRN
  Administered 2023-02-27 (×2): 0.5 mg via INTRAVENOUS
  Filled 2023-02-27 (×2): qty 1

## 2023-02-27 NOTE — Progress Notes (Signed)
Nutrition Follow-up  DOCUMENTATION CODES:   Severe malnutrition in context of chronic illness  INTERVENTION:   TPN meeting 100% of kcal needs and 76% of minimum protein needs as of 5/23 Pt receiving 1.1 grams/kg of IBW  Per surgery can start very low trickle TF today   Pivot 1.5 @ 10 ml/hr (360 kcal and 22 grams protein)  Recommend goal rate of 70 ml/hr Provides: 2520 kcal, 157 grams protein, and 1276 ml free water   NUTRITION DIAGNOSIS:   Severe Malnutrition related to chronic illness as evidenced by moderate fat depletion, severe muscle depletion, energy intake < or equal to 75% for > or equal to 1 month, percent weight loss. Ongoing  GOAL:   Patient will meet greater than or equal to 90% of their needs Kcal needs met with TPN   MONITOR:   Labs, Weight trends, Skin, I & O's  REASON FOR ASSESSMENT:   Consult Enteral/tube feeding initiation and management (advance tube feeds to goal)  ASSESSMENT:   67 yo male who was admitted from home with worsening nausea, vomiting, bloating and weakness. Patient with remote history of multiple prior laparotomies (in the 1970's), with a chronic partial small bowel obstruction.  Spoke with pt and wife who was at bedside.  Nephrology consulted for elevated BUN/Cr would like a trial of decreased protein however pt has lost more weight, pt without adequate nutrition x 10 days due to advancing TF which were then held and TPN held due to elevated Tbili.  Per [facility] protein should not be decreased to delay dialysis as pt's are likely to become malnourished or worsen their malnutrition. Providers aware of recommendations.   04/08 - admitted, PICC line placed 04/09 - TPN initiated 04/13 - TPN increased to goal 04/14 - emergent EGD showing oozing gastric ulcer s/p epi and clips 04/15 - IR for emergent embolization, intubated 04/22 - s/p ex lap, LOA, duodenojejunal bypass with biliary T-tube, duodenostomy tube 04/23 - s/p second OR for ex  lap with transverse colectomy, abd packed and open abd, left in discontinuity due to colonic ischemia, ligation of bleeding mesenteric vessels 04/24 - OR due to acute decompensation s/p ex lap with ileocectomy, temp abd closure, MTP (did not receive TPN due to contaminated port) 04/25 - s/p ex lap, washout, SBR (10 cm necrotic distal ileum), VAC  04/27 - s/p ileostomy, J-tube placement, abd closure  04/30 - extubated  05/05 - TPN cycled  05/06 - TPN stopped due to elevated bilirubin 05/07 - trickle TF started via J-tube 05/09 - TF advancing to goal rate 05/14 - CT showing diffuse small bowel dilation and gastric distention, TF d/c, NG tube placed to LIWS 05/16 - TPN restarted 05/23 - TPN adjusted per provider; meeting 100% kcal needs and only 76% of minimum protein needs- able to start very low trickle TF via J-tube   Admission weight: 68.1 kg  Current weight: 59.2 kg - continues to decrease   Medications reviewed and include: 30 ml free water via J tube every 12 hours IV protonix Levo @ 3 mcg Nabicarb @ 50 ml/hr  TPN via PICC @ 95 ml/hr 2598 kcal, 100 grams protein Standard MVI Standard trace elements removed, zinc added back in (chromium and selenium held)  Labs reviewed:  Na 146 Cl 120   CO2 16 BUN 105 -> 111 Phos 4.8 (no phos in TPN since 5/16) T bili: 24.1 (5/21) maxed at 31.3  UOP: 2375 ml x 24 hours NG tube: 375 ml x 24 hours  Biliary T tube: 0 ml x 24 hours L abd JP: 175 ml x 24 hours 24 F RUQ: 200 ml x 24 hours Ileostomy: 135 ml x 24 hours 16 F J-tube; starting trickle  I/O's: -5.7 L since admit    Diet Order:   Diet Order             Diet NPO time specified Except for: Ice Chips  Diet effective now                   EDUCATION NEEDS:   Education needs have been addressed  Skin:  Skin Assessment: Skin Integrity Issues: Incisions: abd  Last BM:  5/21 10 ml via ileostomy  Height:   Ht Readings from Last 1 Encounters:  01/27/23 6\' 1"  (1.854 m)     Weight:   Wt Readings from Last 1 Encounters:  02/26/23 59.2 kg    BMI:  Body mass index is 17.22 kg/m.  Estimated Nutritional Needs:   Kcal:  2300 - 2600 - discussed with Pharmacy try to meet higher end of needs  Protein:  130-145 grams  Fluid:  2.1L/day   Katalaya Beel P., RD, LDN, CNSC See AMiON for contact information

## 2023-02-27 NOTE — Progress Notes (Signed)
General Surgery Follow Up Note  Subjective:    Overnight Issues: Patient was hypotensive yesterday afternoon after working with PT. Given 1L crystalloid with some improvement, hgb 8.8. Low dose levophed started, at 3 this morning. Tbili 24. Uremia is worsening. Hgb 8.5 this morning.  Objective:  Vital signs for last 24 hours: Temp:  [97.5 F (36.4 C)-97.6 F (36.4 C)] 97.6 F (36.4 C) (05/23 0800) Pulse Rate:  [81-100] 93 (05/23 0900) Resp:  [11-27] 13 (05/23 0900) BP: (60-106)/(21-84) 85/57 (05/23 0900) SpO2:  [90 %-100 %] 100 % (05/23 0900)  Hemodynamic parameters for last 24 hours:    Intake/Output from previous day: 05/22 0701 - 05/23 0700 In: 5573.8 [I.V.:3002.4; NG/GT:120; IV Piggyback:2421.4] Out: 3260 [Urine:2375; Emesis/NG output:375; Drains:375; Stool:135]  Intake/Output this shift: No intake/output data recorded.  Vent settings for last 24 hours:    Physical Exam:  Gen: NAD, appears frail and ill Neuro: lethargic HEENT: scleral icterus; NG in place draining gastric contents CV: RRR Pulm: nonlabored respirations on room air Abd: soft, nondistended, midline wound with fibrinous exudate, fascia in tact. LUQ JP with dark brown fluid. Duodenostomy to gravity with brown drainage. T tube bilious. Ileostomy with dark stool, no blood noted. Extr: wwp, no edema Skin: jaundiced GU: foley in place, dark urine  Results for orders placed or performed during the hospital encounter of 01/13/23 (from the past 24 hour(s))  Glucose, capillary     Status: Abnormal   Collection Time: 02/26/23 12:10 PM  Result Value Ref Range   Glucose-Capillary 109 (H) 70 - 99 mg/dL  Glucose, capillary     Status: Abnormal   Collection Time: 02/26/23  3:50 PM  Result Value Ref Range   Glucose-Capillary 117 (H) 70 - 99 mg/dL  Hemoglobin and hematocrit, blood     Status: Abnormal   Collection Time: 02/26/23  3:57 PM  Result Value Ref Range   Hemoglobin 8.8 (L) 13.0 - 17.0 g/dL   HCT 82.9  (L) 56.2 - 52.0 %  Prepare RBC (crossmatch)     Status: None   Collection Time: 02/26/23  4:53 PM  Result Value Ref Range   Order Confirmation      ORDER PROCESSED BY BLOOD BANK Performed at [facility], [address] [place], [place] [address]   Type and screen     Status: None (Preliminary result)   Collection Time: 02/26/23  6:12 PM  Result Value Ref Range   ABO/RH(D) O NEG    Antibody Screen NEG    Sample Expiration      03/01/2023,2359 Performed at [facility], [address] [place], [place] [address]    Unit Number [id number]    Blood Component Type RBC LR PHER2    Unit division 00    Status of Unit ALLOCATED    Transfusion Status OK TO TRANSFUSE    Crossmatch Result COMPATIBLE    Unit Number [id number]    Blood Component Type RBC LR PHER2    Unit division 00    Status of Unit ALLOCATED    Transfusion Status OK TO TRANSFUSE    Crossmatch Result COMPATIBLE   Glucose, capillary     Status: Abnormal   Collection Time: 02/26/23  7:44 PM  Result Value Ref Range   Glucose-Capillary 121 (H) 70 - 99 mg/dL  Glucose, capillary     Status: Abnormal   Collection Time: 02/26/23 11:33 PM  Result Value Ref Range   Glucose-Capillary 102 (H) 70 - 99 mg/dL  Hemoglobin and hematocrit, blood     Status: Abnormal   Collection Time: 02/27/23 12:39 AM  Result Value Ref Range   Hemoglobin 8.5 (L) 13.0 - 17.0 g/dL   HCT 40.9 (L) 81.1 - 91.4 %  Comprehensive metabolic panel     Status: Abnormal   Collection Time: 02/27/23  6:50 AM  Result Value Ref Range   Sodium 146 (H) 135 - 145 mmol/L   Potassium 4.5 3.5 - 5.1 mmol/L   Chloride 118 (H) 98 - 111 mmol/L   CO2 19 (L) 22 - 32 mmol/L   Glucose, Bld 134 (H) 70 - 99 mg/dL   BUN 782 (H) 8 - 23 mg/dL   Creatinine, Ser 9.56 (H) 0.61 - 1.24 mg/dL   Calcium 8.2 (L) 8.9 - 10.3 mg/dL   Total Protein 5.7 (L) 6.5 - 8.1 g/dL   Albumin 1.7 (L) 3.5 - 5.0 g/dL   AST 213 (H) 15 - 41 U/L   ALT 228 (H) 0 - 44 U/L   Alkaline  Phosphatase 150 (H) 38 - 126 U/L   Total Bilirubin 24.1 (HH) 0.3 - 1.2 mg/dL   GFR, Estimated >08 >65 mL/min   Anion gap 9 5 - 15  Magnesium     Status: None   Collection Time: 02/27/23  6:50 AM  Result Value Ref Range   Magnesium 2.2 1.7 - 2.4 mg/dL  Phosphorus     Status: Abnormal   Collection Time: 02/27/23  6:50 AM  Result Value Ref Range   Phosphorus 4.8 (H) 2.5 - 4.6 mg/dL  CBC     Status: Abnormal   Collection Time: 02/27/23  6:50 AM  Result Value Ref Range   WBC 21.0 (H) 4.0 - 10.5 K/uL   RBC 2.89 (L) 4.22 - 5.81 MIL/uL   Hemoglobin 8.5 (L) 13.0 - 17.0 g/dL   HCT 78.4 (L) 69.6 - 29.5 %   MCV 87.2 80.0 - 100.0 fL   MCH 29.4 26.0 - 34.0 pg   MCHC 33.7 30.0 - 36.0 g/dL   RDW 28.4 (H) 13.2 - 44.0 %   Platelets 191 150 - 400 K/uL   nRBC 0.0 0.0 - 0.2 %  Glucose, capillary     Status: Abnormal   Collection Time: 02/27/23  7:53 AM  Result Value Ref Range   Glucose-Capillary 112 (H) 70 - 99 mg/dL    Assessment & Plan: The plan of care was discussed with the bedside nurse for the day, who is in agreement with this plan and no additional concerns were raised.   Present on Admission:  Bowel obstruction (HCC)  Protein-calorie malnutrition, severe (HCC)  Duodenal obstruction    LOS: 45 days   Additional comments:I reviewed the patient's new clinical lab test results.   and I reviewed the patients new imaging test results.    67 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  4/22 - Ex lap, adhesiolysis, duodenojejunal bypass, duodenostomy tube placement, T tube placement in common bile duct. 4/23 - Takeback for bleeding with hemorrhagic shock, resection of ischemic transverse colon, left in discontinuity with Abthera 4/24 - Takeback for bleeding, abdominal washout, ileocecectomy, pack placement, placement of Abthera 4/25 - Takeback, resection of 10cm necrotic distal ileum, washout, removal of packs, placement of Abthera 4/27 - Takeback, placement of J tube, end ileostomy, abdominal  closure   - Hyperbilirubinemia: Tube cholangiogram on 5/3 showed patency of the biliary tree with no obstruction. Likely multifactorial due to massive transfusion with hemolysis and shock liver. Bilirubin is very  slowly downtrending. Synthetic function is normal. Transaminases rising today, TPN likely contributing. Continue to trend LFTs twice weekly. - Keep T tube and D tube to gravity drainage. 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 - Pain: prn dilaudid, decrease dose today. - Wound care: Saline wet-to-dry to midline wound - ID: WBC 21 today, has been persistently elevated. Will repeat CT abd/pelvis today without contrast to ensure no large undrained fluid collections. If no fluid collections, and bowel is normal in caliber with no signs of obstruction, begin trophic feeds via J tube at 10 ml/hr, do NOT advance. - FEN: NPO, ok for ice chips.  Continue full strength TPN and NG decompression. Discussed with pharmacy, nephrology and RD today - will do a short trial of decreased protein load in TPN as this is likely contributing to azotemia, combined with catabolic state. Cannot discontinue TPN as patient is profoundly malnourished and cannot currently tolerate goal enteral feeds. If BUN continues to rise, may need RRT. On sodium bicarb infusion for acidosis, which is slightly improved today. - AKI: Creatinine improving, BUN continues to trend up. Appreciate nephrology assistance. - Urinary retention: Foley replaced on 5/20 for second time for urinary retention. Keep in place. - WOC following for ostomy care and teaching - PT/OT following. Patient is profoundly deconditioned. - GI bleeding: S/p 2u PRBCs on 5/19 with an appropriate response, hgb slightly decreased yesterday, stable this mornign. Continue to monitor. GI did not feel there are any further endoscopic options. - VTE: SCDs, lovenox on hold given recent GI bleeding. Continue to hold today. - Dispo: ICU  Sophronia Simas, MD [facility] Surgery General, Hepatobiliary and Pancreatic Surgery 02/27/23 9:15 AM

## 2023-02-27 NOTE — Progress Notes (Signed)
Inpatient Rehabilitation Admissions Coordinator   I continue to follow at a distance,as not appropriate for CIR at this time.  Ottie Glazier, RN, MSN Rehab Admissions Coordinator [phone] 02/27/2023 12:31 PM

## 2023-02-27 NOTE — Progress Notes (Signed)
[facility] Progress Note   Bradley Dominguez is a 67 y.o. male with prior history of multiple prior laparotomies and chronic partial bowel obstruction initially admitted 4/8 with small bowel obstruction. 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 Patient has been on TPN most recently from 5/16 through today. Since that time, creatinine has dropped from 2>1.3, BUN has risen to 105, bicarb has been stable 15-16, chloride has mildly increased. Given the rise in BUN we were asked to consult for further recommendations.  Assessment/ Plan:   Azotemia with hyperchloremic non-anion gap metabolic acidosis: Patient is having good urine output, GFR is also stable. Patient is quite sarcopenic on exam and very deconditioned, likely in setting of poor nutritional status and long hospitalization. He is not having any current GI bleeding, Hgb has been stable. No recent steroid use. With current TPN per last RD note patient is getting roughly 2.2g/kg protein, which could be raising BUN due to catabolic state. Can discuss with RD to adjust protein levels if amenable.   - Gave sodium bicarb yesterday, acidosis and chloride mildly improved on labs this morning. - BUN continues to rise, most likely due to the amount of protein being given with TPN in patient's catabolic state. Ideally would like to get off of the TPN and start tube feeds. Regardless, if we cannot decrease the amount of protein, likely will need RRT to  decrease circulating nitrogen.  - Hgb did drop 10>8 from yesterday, does not appear dilutional. Wonder if has mild, oozing UGIB contributing to his azotemia, especially in the setting of multiple bleeds this admission. Vitals do appear stable over the last few days, intermittently requiring pressors. If continues to drop would consider re-engaging GI.  Hyperbilirubinemia w/ elevated transaminases: Throughout to be multifactorial due to shock liver, massive transfusion protocol with hemolysis. T-tube in place, management per CCS. TPN possibly cause of increase in LFT's, would defer to RD to help with adjustments with this.  SBO s/p multiple laparotomies, adhesion breakdowns, ileostomy placement: Currently NPO and continues to have decent NGT output. Per primary.  GI bleeding 2/2 gastric ulcer, ruptured jejunal pseudoaneurysm: Daily CBC, management per primary. Urinary retention: Foley in place.  Subjective:   No acute changes overnight. Appears comfortable.    Objective:   BP (!) 97/54   Pulse 92   Temp 97.6 F (36.4 C) (Oral)   Resp 17   Ht 6\' 1"  (1.854 m)   Wt 59.2 kg   SpO2 100%   BMI 17.22 kg/m   Intake/Output Summary (Last 24 hours) at 02/27/2023 0815 Last data filed at 02/27/2023 0600 Gross per 24 hour  Intake 5478.85 ml  Output 3210 ml  Net 2268.85 ml   Weight change:   Physical Exam: General: Chronically ill-appearing in no acute distress HENT: Normocephalic, atraumatic. NGT in place. Neck: Supple, no JVD CV: Regular rate, rhythm. No murmurs appreciated.  Pulm: Normal work of breathing. Clear to auscultation bilaterally.  GI: Multiple drains, large surgical scar with bandage present.  MSK: Quite sarcopenic throughout. No peripheral edema. Skin: Warm, dry. Jaundiced throughout.  Neuro: Awake, lethargic, but following commands.   Imaging: No results found.  Labs: BMET Recent Labs  Lab 02/21/23 0530 02/22/23 0630 02/22/23 2041 02/23/23 0513 02/24/23 0540  02/25/23 0421 02/26/23 0633 02/27/23 0650  NA 143 145 142 141 141 144 144 146*  K 3.0* 3.5 4.2 3.8 4.2 4.4 4.4 4.5  CL 117* 117* 117* 116* 117* 118* 120* 118*  CO2 16* 18* 15* 14* 16* 17* 16* 19*  GLUCOSE 124* 121* 134* 126* 111* 128* 121* 134*  BUN 64* 59* 66* 73* 88* 96* 105* 111*  CREATININE 1.89* 1.53* 1.63* 1.56* 1.53* 1.34* 1.32* 1.27*  CALCIUM 8.0* 8.5* 8.3* 8.1* 8.6* 8.8* 8.7* 8.2*  PHOS 4.8* 3.8  --  2.7 3.2 4.4 5.0* 4.8*   CBC Recent Labs  Lab 02/24/23 0540 02/24/23 1541 02/25/23 0421 02/26/23 0633 02/26/23 1557 02/27/23 0039 02/27/23 0650  WBC 21.7*  --  21.3* 20.9*  --   --  21.0*  HGB 10.0*   < > 10.1* 10.2* 8.8* 8.5* 8.5*  HCT 28.5*   < > 29.2* 30.0* 26.4* 25.0* 25.2*  MCV 82.8  --  83.0 84.5  --   --  87.2  PLT 155  --  176 200  --   --  191   < > = values in this interval not displayed.    Medications:     Chlorhexidine Gluconate Cloth  6 each Topical Daily   free water  30 mL Per Tube Q12H   pantoprazole  40 mg Intravenous Q12H   sodium chloride flush  10-40 mL Intracatheter Q12H   Evlyn Kanner, MD Internal Medicine PGY-3 02/27/2023, 8:15 AM

## 2023-02-27 NOTE — Progress Notes (Signed)
PHARMACY - TOTAL PARENTERAL NUTRITION CONSULT NOTE  Indication: Prolonged ileus, intolerance to enteral feeding  Patient Measurements: Height: 6\' 1"  (185.4 cm) Weight: 59.2 kg (130 lb 8.2 oz) IBW/kg (Calculated) : 79.9 TPN AdjBW (KG): 65.5 Body mass index is 17.22 kg/m.  Assessment: 19 yom with hx intestinal surgery and subsequent SBO requiring additional resection, admitted 01/13/23 with recurrent issues with SBO at the duodenum. Now s/p duodenal bypass complicated due to adhesions and resulted in small common bile duct injury requiring T-tube placement and external duodenostomy. Patient with massive bleed/hemorrhagic shock, s/p MTP. TPN initiated 4/9-5/6, then transitioned to TF. TF then held 5/14 given small bowel distension secondary to suspected ileus and likely dysmotility. Pharmacy consulted to resume TPN 5/16.  Glucose / Insulin: no hx DM - CBGs: 102-132, no SSI ordered   Electrolytes: Na 146 (Free water 30 Q12H for Jtube patency added, Na out of TPN for several days), K 4.5 (goal >/= 4 for ileus), Cl: 118, HCO3: 19, Phos: 4.8 (removed 5/16), Mag: 2.2 goal > 2 for ileus, CoCa: 10.0  Renal: AKI resolving but still not at baseline - SCr 1.27 (BL ~0.7-0.9), BUN up to 111    Hepatic: LFTs uptrending 257/228, T-bili: 24.1, Alk Phos: 150  Intake / Output; MIVF: UOP 1.7 ml/kg/hr, NGT output 375 ml, Biliary T-tube 375 ml, LUQ JP drain minimal output, duodenostomy 10mL; bicarb gtt 71mL/hr  GI Imaging:  5/14 CT abd pelvis - feculent material throughout the SB/abdomen, suspicious for SB obstruction, bowel perforation/dehiscence, interval development of gas-containing fluid collection in R paracolic gutter 5/3 CT abdomen: bowel edema, no cause identified for T-bili elevation  5/3 cholangiogram: mild dilation of bilary tract   4/4 UGI: Findings suggestive of chronic pSBO or ileus 4/9 CT: Ileus vs high grade partial SBO GI Surgeries / Procedures: 4/22 - Ex lap, adhesiolysis, duodenojejunal bypass,  duodenostomy tube placement, T tube placement in common bile duct. 4/23 - Takeback for bleeding with hemorrhagic shock, resection of ischemic transverse colon, left in discontinuity with Abthera 4/24 - Takeback for bleeding, abdominal washout, ileocecectomy, pack placement, placement of Abthera 4/25 - Takeback, resection of 10cm necrotic distal ileum, washout, removal of packs, placement of Abthera 4/27 - Takeback, placement of J tube, end ileostomy, abdominal closure  Central access: triple lumen PICC placed 01/23/23 TPN start date: 01/14/23-02/10/23, 5/16>>  Nutritional Goals: Goal TPN rate is 95 mL/hr (provides 134 g of protein and 2307 kcals per day)  RD Estimated Needs Total Energy Estimated Needs: 2300 - 2600 - discussed with Pharmacy try to meet higher end of needs Total Protein Estimated Needs: 130-145 grams Total Fluid Estimated Needs: 2.1L/day  Current Nutrition:  TPN NPO - ice chips   Plan:  Continue TPN at 95 ml/hr at 1800, providing 100g AA, 417g CHO (GIR 4.89), and 78g lipids total of 2598kcal meeting 100% of caloric needs.  Discussed with CCS, nephrology, and RD. Reducing protein amount in an attempt to limit BUN increase. Aware of high lipid content, 30% of total calories but over 1g/kg.   Electrolytes in TPN: Na 31mEq/L, decrease K 30 mEq/L, Ca 24mEq/L, Mag 7mEq/L, Phos 5mmol/L since 5/16, max acetate.  Small reduction in K due to trend and try to assist with limiting chloride load.  Add standard MVI to TPN.  Remove standard trace elements with elevated Tbili and add back zinc 5mg , continue to hold chromium, and selenium.  Monitor TPN labs standard Mon/Thurs.  F/u CT abdomen on 5/23 , potential to resume some trickle feeds via  Domenica Fail, PharmD, BCCCP  Clinical Pharmacist Please see AMION for all Pharmacists' Contact Phone Numbers 02/27/2023, 8:46 AM

## 2023-02-28 LAB — GLUCOSE, CAPILLARY
Glucose-Capillary: 111 mg/dL — ABNORMAL HIGH (ref 70–99)
Glucose-Capillary: 117 mg/dL — ABNORMAL HIGH (ref 70–99)
Glucose-Capillary: 119 mg/dL — ABNORMAL HIGH (ref 70–99)
Glucose-Capillary: 121 mg/dL — ABNORMAL HIGH (ref 70–99)
Glucose-Capillary: 123 mg/dL — ABNORMAL HIGH (ref 70–99)
Glucose-Capillary: 125 mg/dL — ABNORMAL HIGH (ref 70–99)

## 2023-02-28 LAB — CBC
HCT: 22.9 % — ABNORMAL LOW (ref 39.0–52.0)
HCT: 24.9 % — ABNORMAL LOW (ref 39.0–52.0)
Hemoglobin: 7.9 g/dL — ABNORMAL LOW (ref 13.0–17.0)
Hemoglobin: 8.5 g/dL — ABNORMAL LOW (ref 13.0–17.0)
MCH: 29.8 pg (ref 26.0–34.0)
MCH: 29.1 pg (ref 26.0–34.0)
MCHC: 34.5 g/dL (ref 30.0–36.0)
MCHC: 34.1 g/dL (ref 30.0–36.0)
MCV: 86.4 fL (ref 80.0–100.0)
MCV: 85.3 fL (ref 80.0–100.0)
Platelets: 199 10*3/uL (ref 150–400)
Platelets: 203 10*3/uL (ref 150–400)
RBC: 2.65 MIL/uL — ABNORMAL LOW (ref 4.22–5.81)
RBC: 2.92 MIL/uL — ABNORMAL LOW (ref 4.22–5.81)
RDW: 24.2 % — ABNORMAL HIGH (ref 11.5–15.5)
RDW: 23.3 % — ABNORMAL HIGH (ref 11.5–15.5)
WBC: 19.1 10*3/uL — ABNORMAL HIGH (ref 4.0–10.5)
WBC: 20.5 10*3/uL — ABNORMAL HIGH (ref 4.0–10.5)
nRBC: 0 % (ref 0.0–0.2)
nRBC: 0 % (ref 0.0–0.2)

## 2023-02-28 LAB — BPAM RBC
Blood Product Expiration Date: 202406052359
ISSUE DATE / TIME: 202405240921

## 2023-02-28 LAB — BASIC METABOLIC PANEL
Anion gap: 10 (ref 5–15)
BUN: 111 mg/dL — ABNORMAL HIGH (ref 8–23)
CO2: 22 mmol/L (ref 22–32)
Calcium: 8.3 mg/dL — ABNORMAL LOW (ref 8.9–10.3)
Chloride: 112 mmol/L — ABNORMAL HIGH (ref 98–111)
Creatinine, Ser: 1.38 mg/dL — ABNORMAL HIGH (ref 0.61–1.24)
GFR, Estimated: 56 mL/min — ABNORMAL LOW (ref >60)
Glucose, Bld: 138 mg/dL — ABNORMAL HIGH (ref 70–99)
Potassium: 4.4 mmol/L (ref 3.5–5.1)
Sodium: 144 mmol/L (ref 135–145)

## 2023-02-28 LAB — TYPE AND SCREEN: Unit division: 0

## 2023-02-28 MED ORDER — ORAL CARE MOUTH RINSE
15.0000 mL | OROMUCOSAL | Status: DC
  Administered 2023-02-28 – 2023-03-09 (×36): 15 mL via OROMUCOSAL

## 2023-02-28 MED ORDER — ORAL CARE MOUTH RINSE: 15.0000 mL | OROMUCOSAL | Status: DC | PRN

## 2023-02-28 MED ORDER — ZINC CHLORIDE 1 MG/ML IV SOLN
INTRAVENOUS | Status: AC
  Filled 2023-02-28: qty 1000.9

## 2023-02-28 MED ORDER — SODIUM CHLORIDE 0.9% IV SOLUTION: Freq: Once | INTRAVENOUS | Status: AC

## 2023-02-28 MED ORDER — ALBUMIN HUMAN 5 % IV SOLN
25.0000 g | Freq: Once | INTRAVENOUS | Status: AC
  Administered 2023-02-28: 25 g via INTRAVENOUS
  Filled 2023-02-28: qty 500

## 2023-02-28 NOTE — Progress Notes (Signed)
General Surgery Follow Up Note  Subjective:    Overnight Issues: No acute changes. Remains on low dose levophed. CT yesterday did not show any intraabdominal fluid collections. Tolerating feeds at 10. NG output is blood-tinged. Hgb 7.9 from 8.5, BMP pending.  Objective:  Vital signs for last 24 hours: Temp:  [97.4 F (36.3 C)-97.6 F (36.4 C)] 97.6 F (36.4 C) (05/24 0400) Pulse Rate:  [88-101] 97 (05/24 0615) Resp:  [13-23] 13 (05/24 0615) BP: (76-105)/(45-75) 87/57 (05/24 0615) SpO2:  [96 %-100 %] 99 % (05/24 0615)  Hemodynamic parameters for last 24 hours:    Intake/Output from previous day: 05/23 0701 - 05/24 0700 In: 3896.9 [I.V.:3725.5; NG/GT:141.3] Out: 4270 [Urine:2490; Emesis/NG output:950; Drains:730; Stool:100]  Intake/Output this shift: Total I/O In: 1825.4 [I.V.:1715.4; NG/GT:110] Out: 1855 [Urine:1200; Emesis/NG output:350; Drains:305]  Vent settings for last 24 hours:    Physical Exam:  Gen: NAD, appears frail and ill Neuro: lethargic HEENT: scleral icterus; NG in place draining gastric contents, slight blood tinge CV: RRR Pulm: nonlabored respirations on room air Abd: soft, nondistended, midline wound with fibrinous exudate, fascia in tact. LUQ JP with dark brown fluid. Duodenostomy to gravity with brown drainage. T tube bilious. Ileostomy with dark melanic stool. Extr: wwp, no edema Skin: jaundiced GU: foley in place, dark urine  Results for orders placed or performed during the hospital encounter of 01/13/23 (from the past 24 hour(s))  Comprehensive metabolic panel     Status: Abnormal   Collection Time: 02/27/23  6:50 AM  Result Value Ref Range   Sodium 146 (H) 135 - 145 mmol/L   Potassium 4.5 3.5 - 5.1 mmol/L   Chloride 118 (H) 98 - 111 mmol/L   CO2 19 (L) 22 - 32 mmol/L   Glucose, Bld 134 (H) 70 - 99 mg/dL   BUN 161 (H) 8 - 23 mg/dL   Creatinine, Ser 0.96 (H) 0.61 - 1.24 mg/dL   Calcium 8.2 (L) 8.9 - 10.3 mg/dL   Total Protein 5.7 (L)  6.5 - 8.1 g/dL   Albumin 1.7 (L) 3.5 - 5.0 g/dL   AST 045 (H) 15 - 41 U/L   ALT 228 (H) 0 - 44 U/L   Alkaline Phosphatase 150 (H) 38 - 126 U/L   Total Bilirubin 24.1 (HH) 0.3 - 1.2 mg/dL   GFR, Estimated >40 >98 mL/min   Anion gap 9 5 - 15  Magnesium     Status: None   Collection Time: 02/27/23  6:50 AM  Result Value Ref Range   Magnesium 2.2 1.7 - 2.4 mg/dL  Phosphorus     Status: Abnormal   Collection Time: 02/27/23  6:50 AM  Result Value Ref Range   Phosphorus 4.8 (H) 2.5 - 4.6 mg/dL  CBC     Status: Abnormal   Collection Time: 02/27/23  6:50 AM  Result Value Ref Range   WBC 21.0 (H) 4.0 - 10.5 K/uL   RBC 2.89 (L) 4.22 - 5.81 MIL/uL   Hemoglobin 8.5 (L) 13.0 - 17.0 g/dL   HCT 11.9 (L) 14.7 - 82.9 %   MCV 87.2 80.0 - 100.0 fL   MCH 29.4 26.0 - 34.0 pg   MCHC 33.7 30.0 - 36.0 g/dL   RDW 56.2 (H) 13.0 - 86.5 %   Platelets 191 150 - 400 K/uL   nRBC 0.0 0.0 - 0.2 %  Glucose, capillary     Status: Abnormal   Collection Time: 02/27/23  7:53 AM  Result Value Ref Range  Glucose-Capillary 112 (H) 70 - 99 mg/dL  Glucose, capillary     Status: Abnormal   Collection Time: 02/27/23 10:52 AM  Result Value Ref Range   Glucose-Capillary 112 (H) 70 - 99 mg/dL  Hemoglobin and hematocrit, blood     Status: Abnormal   Collection Time: 02/27/23  3:37 PM  Result Value Ref Range   Hemoglobin 8.5 (L) 13.0 - 17.0 g/dL   HCT 16.1 (L) 09.6 - 04.5 %  Glucose, capillary     Status: Abnormal   Collection Time: 02/27/23  3:44 PM  Result Value Ref Range   Glucose-Capillary 107 (H) 70 - 99 mg/dL  Glucose, capillary     Status: Abnormal   Collection Time: 02/27/23  7:36 PM  Result Value Ref Range   Glucose-Capillary 130 (H) 70 - 99 mg/dL  Glucose, capillary     Status: Abnormal   Collection Time: 02/27/23 11:23 PM  Result Value Ref Range   Glucose-Capillary 115 (H) 70 - 99 mg/dL  Glucose, capillary     Status: Abnormal   Collection Time: 02/28/23  3:33 AM  Result Value Ref Range    Glucose-Capillary 111 (H) 70 - 99 mg/dL  CBC     Status: Abnormal   Collection Time: 02/28/23  6:20 AM  Result Value Ref Range   WBC 19.1 (H) 4.0 - 10.5 K/uL   RBC 2.65 (L) 4.22 - 5.81 MIL/uL   Hemoglobin 7.9 (L) 13.0 - 17.0 g/dL   HCT 40.9 (L) 81.1 - 91.4 %   MCV 86.4 80.0 - 100.0 fL   MCH 29.8 26.0 - 34.0 pg   MCHC 34.5 30.0 - 36.0 g/dL   RDW 78.2 (H) 95.6 - 21.3 %   Platelets 199 150 - 400 K/uL   nRBC 0.0 0.0 - 0.2 %    Assessment & Plan: The plan of care was discussed with the bedside nurse for the day, who is in agreement with this plan and no additional concerns were raised.   Present on Admission:  Bowel obstruction (HCC)  Protein-calorie malnutrition, severe (HCC)  Duodenal obstruction    LOS: 46 days   Additional comments:I reviewed the patient's new clinical lab test results.   and I reviewed the patients new imaging test results.    67 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  4/22 - Ex lap, adhesiolysis, duodenojejunal bypass, duodenostomy tube placement, T tube placement in common bile duct. 4/23 - Takeback for bleeding with hemorrhagic shock, resection of ischemic transverse colon, left in discontinuity with Abthera 4/24 - Takeback for bleeding, abdominal washout, ileocecectomy, pack placement, placement of Abthera 4/25 - Takeback, resection of 10cm necrotic distal ileum, washout, removal of packs, placement of Abthera 4/27 - Takeback, placement of J tube, end ileostomy, abdominal closure   - Hyperbilirubinemia: Tube cholangiogram on 5/3 showed patency of the biliary tree with no obstruction. Likely multifactorial due to massive transfusion with hemolysis and shock liver. Bilirubin is very slowly downtrending. Synthetic function is normal. Continue trending LFTs twice weekly. - Keep T tube and D tube to gravity drainage. 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 - Pain: prn dilaudid - Wound care: Saline wet-to-dry to midline wound - ID: WBC 19, no  intraabdominal collections on CT scan. Concern for pneumonia RLL, patient was previously treated for pneumonia, currently no clinical signs of respiratory distress, productive cough or fevers. Hold on abx at this time. - FEN: NPO, ok for ice chips.  Continue full strength TPN and NG decompression. Continue trophic  feeds, will plan to advance very slowly, keep at 10 today. BUN stable at 111. Protein load in TPN decreased yesterday. Slow GI bleed likely contributing. Appreciate nephrology assistance, may need to consider RRT if persistent azotemia. - AKI: Improving. - Urinary retention: Foley replaced on 5/20 for second time for urinary retention. Keep in place. - WOC following for ostomy care and teaching - PT/OT following. Patient is profoundly deconditioned. - GI bleeding: Dark blood present in NG and ileostomy output. Very slow drift down in hgb, 7.9 today. Given levophed requirement, will transfuse 1u PRBCs. - VTE: SCDs, lovenox on hold given recent GI bleeding.  - Dispo: ICU  Sophronia Simas, MD [facility] Surgery General, Hepatobiliary and Pancreatic Surgery 02/28/23 6:42 AM

## 2023-02-28 NOTE — Progress Notes (Signed)
PHARMACY - TOTAL PARENTERAL NUTRITION CONSULT NOTE  Indication: Prolonged ileus, intolerance to enteral feeding  Patient Measurements: Height: 6\' 1"  (185.4 cm) Weight: 59.2 kg (130 lb 8.2 oz) IBW/kg (Calculated) : 79.9 TPN AdjBW (KG): 65.5 Body mass index is 17.22 kg/m.  Assessment: 52 yom with hx intestinal surgery and subsequent SBO requiring additional resection, admitted 01/13/23 with recurrent issues with SBO at the duodenum. Now s/p duodenal bypass complicated due to adhesions and resulted in small common bile duct injury requiring T-tube placement and external duodenostomy. Patient with massive bleed/hemorrhagic shock, s/p MTP. TPN initiated 4/9-5/6, then transitioned to TF. TF then held 5/14 given small bowel distension secondary to suspected ileus and likely dysmotility. Pharmacy consulted to resume TPN 5/16.  Glucose / Insulin: no hx DM - CBGs: 107-132, no SSI ordered   Electrolytes: Na 144 (Free water 30 Q12H for Jtube patency added, Na out of TPN for several days), K 4.4 (goal >/= 4 for ileus), Cl: 112, HCO3: 22, CoCa: 10.1  5/23: Phos: 4.8 (removed 5/16), Mag: 2.2 goal > 2 for ileus  Renal: AKI stable but still not at baseline - SCr 1.38 (BL ~0.7-0.9), BUN  stable at 111    Hepatic: 5/23: LFTs uptrending 257/228, T-bili: 24.1, Alk Phos: 150, TG: 130  Intake / Output; MIVF: UOP 1.8 ml/kg/hr, NGT output 950 ml, Biliary T-tube 730 ml, LUQ JP drain minimal output, duodenostomy ; bicarb gtt 32mL/hr  GI Imaging:  5/23 CT abd pelvis - post-surgical changes, questionable pneumonia  5/14 CT abd pelvis - feculent material throughout the SB/abdomen, suspicious for SB obstruction, bowel perforation/dehiscence, interval development of gas-containing fluid collection in R paracolic gutter 5/3 CT abdomen: bowel edema, no cause identified for T-bili elevation  5/3 cholangiogram: mild dilation of bilary tract   4/4 UGI: Findings suggestive of chronic pSBO or ileus 4/9 CT: Ileus vs high grade  partial SBO GI Surgeries / Procedures: 4/22 - Ex lap, adhesiolysis, duodenojejunal bypass, duodenostomy tube placement, T tube placement in common bile duct. 4/23 - Takeback for bleeding with hemorrhagic shock, resection of ischemic transverse colon, left in discontinuity with Abthera 4/24 - Takeback for bleeding, abdominal washout, ileocecectomy, pack placement, placement of Abthera 4/25 - Takeback, resection of 10cm necrotic distal ileum, washout, removal of packs, placement of Abthera 4/27 - Takeback, placement of J tube, end ileostomy, abdominal closure  Central access: triple lumen PICC placed 01/23/23 TPN start date: 01/14/23-02/10/23, 5/16>>  Nutritional Goals: Goal TPN rate is 95 mL/hr (provides 134 g of protein and 2307 kcals per day)  RD Estimated Needs Total Energy Estimated Needs: 2300 - 2600 - discussed with Pharmacy try to meet higher end of needs Total Protein Estimated Needs: 130-145 grams Total Fluid Estimated Needs: 2.1L/day  Current Nutrition:  TPN J tube feeds at 29mL/hr   Plan:  Continue TPN at 95 ml/hr at 1800, providing 100g AA, 417g CHO (GIR 4.89), and 78g lipids total of 2598kcal meeting 100% of caloric needs.  Discussed with CCS, nephrology, and RD. Reducing protein amount in an attempt to limit BUN increase. Aware of high lipid content, 30% of total calories but over 1g/kg.  Will continue for a few more days BUN stable today.  Electrolytes in TPN: Na 54mEq/L, K 30 mEq/L, Ca 50mEq/L, Mag 85mEq/L, Phos 60mmol/L since 5/16, max acetate.  Add standard MVI to TPN.  Remove standard trace elements with elevated Tbili and add back zinc 5mg , continue to hold chromium, and selenium.  Monitor TPN labs standard Mon/Thurs. BMP tomorrow per primary  team.  F/u ability to increase trickle feeds.   Estill Batten, PharmD, BCCCP  Clinical Pharmacist Please see AMION for all Pharmacists' Contact Phone Numbers 02/28/2023, 8:18 AM

## 2023-02-28 NOTE — Progress Notes (Signed)
Nutrition Follow-up  DOCUMENTATION CODES:   Severe malnutrition in context of chronic illness  INTERVENTION:   TPN meeting 100% of kcal needs and 76% of minimum protein needs as of 5/23 Pt receiving 1.1 grams/kg of IBW  Per surgery can continue very low trickle TF today   Pivot 1.5 @ 10 ml/hr (360 kcal and 22 grams protein)  Recommend goal rate of 70 ml/hr Provides: 2520 kcal, 157 grams protein, and 1276 ml free water   NUTRITION DIAGNOSIS:   Severe Malnutrition related to chronic illness as evidenced by moderate fat depletion, severe muscle depletion, energy intake < or equal to 75% for > or equal to 1 month, percent weight loss. Ongoing  GOAL:   Patient will meet greater than or equal to 90% of their needs Kcal needs met with TPN   MONITOR:   Labs, Weight trends, Skin, I & O's  REASON FOR ASSESSMENT:   Consult Enteral/tube feeding initiation and management (advance tube feeds to goal)  ASSESSMENT:   67 yo male who was admitted from home with worsening nausea, vomiting, bloating and weakness. Patient with remote history of multiple prior laparotomies (in the 1970's), with a chronic partial small bowel obstruction.  Pt discussed during ICU rounds and with RN.  BUN remains at 111, per surgery GI bleed likely cause of increased BUN, agree. Pt to receive 8 grams of protein less over the next 24 hours which would not have any impact on BUN.   04/08 - admitted, PICC line placed 04/09 - TPN initiated 04/13 - TPN increased to goal 04/14 - emergent EGD showing oozing gastric ulcer s/p epi and clips 04/15 - IR for emergent embolization, intubated 04/22 - s/p ex lap, LOA, duodenojejunal bypass with biliary T-tube, duodenostomy tube 04/23 - s/p second OR for ex lap with transverse colectomy, abd packed and open abd, left in discontinuity due to colonic ischemia, ligation of bleeding mesenteric vessels 04/24 - OR due to acute decompensation s/p ex lap with ileocectomy, temp  abd closure, MTP (did not receive TPN due to contaminated port) 04/25 - s/p ex lap, washout, SBR (10 cm necrotic distal ileum), VAC  04/27 - s/p ileostomy, J-tube placement, abd closure  04/30 - extubated  05/05 - TPN cycled  05/06 - TPN stopped due to elevated bilirubin 05/07 - trickle TF started via J-tube 05/09 - TF advancing to goal rate 05/14 - CT showing diffuse small bowel dilation and gastric distention, TF d/c, NG tube placed to LIWS 05/16 - TPN restarted 05/23 - TPN adjusted per provider; meeting 100% kcal needs and only 76% of minimum protein needs- able to start very low trickle TF via J-tube   Admission weight: 68.1 kg  Current weight: 59.2 kg - continues to decrease   Medications reviewed and include: 30 ml free water via J tube every 12 hours IV protonix Levo @ 3 mcg Nabicarb @ 50 ml/hr  TPN via PICC @ 95 ml/hr 2598 kcal, 100 grams protein Standard MVI Standard trace elements removed, zinc added back in (chromium and selenium held)  Labs reviewed:  Na 144 Cl 120 - > 112   BUN 105 -> 111 -> 111 Phos 4.8 (no phos in TPN since 5/16) T bili: 24.1 (5/23) maxed at 31.3  UOP: 2490 ml x 24 hours NG tube: 950 ml x 24 hours Biliary T tube: 5 ml x 24 hours L abd JP: 225 ml x 24 hours 24 F RUQ: 500 ml x 24 hours Ileostomy: 100 ml x 24  hours 16 F J-tube; trickle TF I/O's: -5.4 L since admit    Diet Order:   Diet Order             Diet NPO time specified Except for: Ice Chips  Diet effective now                   EDUCATION NEEDS:   Education needs have been addressed  Skin:  Skin Assessment: Skin Integrity Issues: Incisions: abd  Last BM:  5/21 10 ml via ileostomy  Height:   Ht Readings from Last 1 Encounters:  01/27/23 6\' 1"  (1.854 m)    Weight:   Wt Readings from Last 1 Encounters:  02/28/23 59.2 kg    BMI:  Body mass index is 17.22 kg/m.  Estimated Nutritional Needs:   Kcal:  2300 - 2600 - discussed with Pharmacy try to meet  higher end of needs  Protein:  130-145 grams  Fluid:  2.1L/day   Darrel Gloss P., RD, LDN, CNSC See AMiON for contact information

## 2023-02-28 NOTE — Progress Notes (Signed)
[facility] Progress Note   Bradley Dominguez is a 67 y.o. male with prior history of multiple prior laparotomies and chronic partial bowel obstruction initially admitted 4/8 with small bowel obstruction. 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 Patient has been on TPN most recently from 5/16 through today. Since that time, creatinine has dropped from 2>1.3, BUN has risen to 105, bicarb has been stable 15-16, chloride has mildly increased. Given the rise in BUN we were asked to consult for further recommendations.  Assessment/ Plan:   Azotemia with hyperchloremic non-anion gap metabolic acidosis: Patient is having good urine output, GFR is also stable. Patient is quite sarcopenic on exam and very deconditioned, likely in setting of poor nutritional status and long hospitalization. He is not having any current GI bleeding, Hgb has been stable. No recent steroid use. With current TPN per last RD note patient is getting roughly 2.2g/kg protein, which could be raising BUN due to catabolic state. Can discuss with RD to adjust protein levels if amenable.   - Continuing to give sodium bicarb, acidosis has resolved and chloride improving with this.  - BUN stable from yesterday. Trickle feeds have started, hopeful to slowly wean off TPN. No acute indication for CRRT today.  - Hgb continues to drop, 10.2>7.9 over the last few days, not dilutional. With increase in BUN and this I wonder how much of the  azotemia is 2/2 to underlying GIB vs protein breakdown. Overall vitals unchanged, still requiring low-dose pressors. Could consider checking INR given his elevated transaminases to further assess synthetic function.  - Thankfully, continues to have good urine output, creatinine stable.   Hyperbilirubinemia w/ elevated transaminases: Throughout to be multifactorial due to shock liver, massive transfusion protocol with hemolysis. T-tube in place, management per CCS. TPN possibly cause of increase in LFT's, would defer to RD to help with adjustments with this.  SBO s/p multiple laparotomies, adhesion breakdowns, ileostomy placement: Currently NPO and continues to have decent NGT output. Per primary.  GI bleeding 2/2 gastric ulcer, ruptured jejunal pseudoaneurysm: Daily CBC, management per primary. Urinary retention: Foley in place.  Subjective:   No acute changes overnight. Appears comfortable. Hgb continues to drop.    Objective:   BP (!) 92/55 (BP Location: Left Arm)   Pulse 100   Temp 97.6 F (36.4 C) (Axillary)   Resp 17   Ht 6\' 1"  (1.854 m)   Wt 59.2 kg   SpO2 100%   BMI 17.22 kg/m   Intake/Output Summary (Last 24 hours) at 02/28/2023 1191 Last data filed at 02/28/2023 0800 Gross per 24 hour  Intake 4286.73 ml  Output 4520 ml  Net -233.27 ml    Weight change:   Physical Exam: General: Chronically ill-appearing in no acute distress HENT: Normocephalic, atraumatic. NGT in place. Neck: Supple, no JVD CV: Regular rate, rhythm. No murmurs appreciated.  Pulm: Normal work of breathing. Clear to auscultation bilaterally.  GI: Multiple drains, large surgical scar with bandage present.  MSK: Quite sarcopenic throughout. No peripheral edema. Skin: Warm, dry. Jaundiced throughout.  Neuro: Awake, lethargic, but following commands.   Imaging: CT ABDOMEN PELVIS WO CONTRAST  Result  Date: 02/27/2023 CLINICAL DATA:  Postoperative fluid collections EXAM: CT ABDOMEN AND PELVIS WITHOUT  CONTRAST TECHNIQUE: Multidetector CT imaging of the abdomen and pelvis was performed following the standard protocol without IV contrast. RADIATION DOSE REDUCTION: This exam was performed according to the departmental dose-optimization program which includes automated exposure control, adjustment of the mA and/or kV according to patient size and/or use of iterative reconstruction technique. COMPARISON:  02/18/2023 FINDINGS: Lower chest: Two small fluid and gas collections in the anterior right lower lobe measuring 17-18 mm (series 5/images 11 and 15), poorly evaluated. Small right pleural effusion with mild patchy right lower lobe opacity, likely combination of atelectasis and pneumonia. Mild left basilar atelectasis. Hepatobiliary: Unenhanced liver is unremarkable. Status post cholecystectomy. Indwelling T-tube. No intrahepatic or extrahepatic ductal dilatation. Pancreas: Within normal limits. Spleen: Within normal limits. Adrenals/Urinary Tract: Adrenal glands are within normal limits. Kidneys are within normal limits. No renal calculi or hydronephrosis. Bladder is notable for an indwelling Foley catheter with small volume nondependent gas. Stomach/Bowel: Enteric tube terminates in the proximal stomach with surgical clips the inferior gastric antrum. Postsurgical changes involving the bowel with suture line in the anterior mid abdomen (series 3/image 37) and right mid/lower abdomen (series 3/53). No evidence of bowel obstruction. Prior distended/stool-filled bowel in the right mid/lower abdomen is now decompressed. Indwelling percutaneous catheter. Vascular/Lymphatic: No evidence of abdominal aortic aneurysm. No suspicious abdominopelvic lymphadenopathy. Reproductive: Prostate is grossly unremarkable. Other: Small volume abdominopelvic ascites with scattered small foci of nondependent gas (for example, beneath the anterior abdominal wall on series 3/image 27), likely postsurgical. Overlying midline skin staples.  Musculoskeletal: Mild degenerative changes of the lumbar spine. IMPRESSION: Extensive postsurgical changes involving the stomach and bowel, as above. Additional support hardware. Small volume abdominopelvic ascites with scattered small foci of nondependent gas, likely postsurgical. No drainable fluid collection/abscess. Patchy right lower lobe opacity, suspicious for pneumonia, with small right pleural effusion. Possible small right lower lobe lung abscesses, incompletely visualized and poorly evaluated. Consider CT chest for further evaluation. Electronically Signed   By: Charline Bills M.D.   On: 02/27/2023 12:54    Labs: BMET Recent Labs  Lab 02/22/23 0630 02/22/23 2041 02/23/23 0513 02/24/23 0540 02/25/23 0421 02/26/23 0633 02/27/23 0650 02/28/23 0620  NA 145 142 141 141 144 144 146* 144  K 3.5 4.2 3.8 4.2 4.4 4.4 4.5 4.4  CL 117* 117* 116* 117* 118* 120* 118* 112*  CO2 18* 15* 14* 16* 17* 16* 19* 22  GLUCOSE 121* 134* 126* 111* 128* 121* 134* 138*  BUN 59* 66* 73* 88* 96* 105* 111* 111*  CREATININE 1.53* 1.63* 1.56* 1.53* 1.34* 1.32* 1.27* 1.38*  CALCIUM 8.5* 8.3* 8.1* 8.6* 8.8* 8.7* 8.2* 8.3*  PHOS 3.8  --  2.7 3.2 4.4 5.0* 4.8*  --     CBC Recent Labs  Lab 02/25/23 0421 02/26/23 0633 02/26/23 1557 02/27/23 0039 02/27/23 0650 02/27/23 1537 02/28/23 0620  WBC 21.3* 20.9*  --   --  21.0*  --  19.1*  HGB 10.1* 10.2*   < > 8.5* 8.5* 8.5* 7.9*  HCT 29.2* 30.0*   < > 25.0* 25.2* 24.4* 22.9*  MCV 83.0 84.5  --   --  87.2  --  86.4  PLT 176 200  --   --  191  --  199   < > = values in this interval not displayed.     Medications:     sodium chloride   Intravenous Once   Chlorhexidine Gluconate Cloth  6 each Topical Daily   free water  30 mL Per Tube Q12H   pantoprazole  40 mg Intravenous Q12H   sodium chloride flush  10-40 mL Intracatheter Q12H   Evlyn Kanner, MD Internal Medicine PGY-3 02/28/2023, 8:19 AM

## 2023-03-01 LAB — GLUCOSE, CAPILLARY
Glucose-Capillary: 106 mg/dL — ABNORMAL HIGH (ref 70–99)
Glucose-Capillary: 116 mg/dL — ABNORMAL HIGH (ref 70–99)
Glucose-Capillary: 115 mg/dL — ABNORMAL HIGH (ref 70–99)
Glucose-Capillary: 113 mg/dL — ABNORMAL HIGH (ref 70–99)
Glucose-Capillary: 118 mg/dL — ABNORMAL HIGH (ref 70–99)
Glucose-Capillary: 125 mg/dL — ABNORMAL HIGH (ref 70–99)

## 2023-03-01 LAB — BPAM RBC
Blood Product Expiration Date: 202406032359
Blood Product Expiration Date: 202405302359

## 2023-03-01 LAB — MAGNESIUM: Magnesium: 2.1 mg/dL (ref 1.7–2.4)

## 2023-03-01 LAB — CBC
HCT: 22.7 % — ABNORMAL LOW (ref 39.0–52.0)
HCT: 20.8 % — ABNORMAL LOW (ref 39.0–52.0)
Hemoglobin: 7.9 g/dL — ABNORMAL LOW (ref 13.0–17.0)
Hemoglobin: 7.1 g/dL — ABNORMAL LOW (ref 13.0–17.0)
MCH: 29.9 pg (ref 26.0–34.0)
MCH: 30.5 pg (ref 26.0–34.0)
MCHC: 34.8 g/dL (ref 30.0–36.0)
MCHC: 34.1 g/dL (ref 30.0–36.0)
MCV: 86 fL (ref 80.0–100.0)
MCV: 89.3 fL (ref 80.0–100.0)
Platelets: 194 10*3/uL (ref 150–400)
Platelets: 199 10*3/uL (ref 150–400)
RBC: 2.64 MIL/uL — ABNORMAL LOW (ref 4.22–5.81)
RBC: 2.33 MIL/uL — ABNORMAL LOW (ref 4.22–5.81)
RDW: 23.9 % — ABNORMAL HIGH (ref 11.5–15.5)
RDW: 24.5 % — ABNORMAL HIGH (ref 11.5–15.5)
WBC: 17.1 10*3/uL — ABNORMAL HIGH (ref 4.0–10.5)
WBC: 15.5 10*3/uL — ABNORMAL HIGH (ref 4.0–10.5)
nRBC: 0 % (ref 0.0–0.2)
nRBC: 0 % (ref 0.0–0.2)

## 2023-03-01 LAB — TYPE AND SCREEN
ABO/RH(D): O NEG
Unit division: 0
Unit division: 0

## 2023-03-01 LAB — BASIC METABOLIC PANEL
Anion gap: 12 (ref 5–15)
BUN: 97 mg/dL — ABNORMAL HIGH (ref 8–23)
CO2: 26 mmol/L (ref 22–32)
Calcium: 8.2 mg/dL — ABNORMAL LOW (ref 8.9–10.3)
Chloride: 107 mmol/L (ref 98–111)
Creatinine, Ser: 1.42 mg/dL — ABNORMAL HIGH (ref 0.61–1.24)
GFR, Estimated: 54 mL/min — ABNORMAL LOW (ref >60)
Glucose, Bld: 127 mg/dL — ABNORMAL HIGH (ref 70–99)
Potassium: 4.1 mmol/L (ref 3.5–5.1)
Sodium: 145 mmol/L (ref 135–145)

## 2023-03-01 LAB — PREPARE RBC (CROSSMATCH)

## 2023-03-01 LAB — HEMOGLOBIN AND HEMATOCRIT, BLOOD
HCT: 25.5 % — ABNORMAL LOW (ref 39.0–52.0)
Hemoglobin: 8.6 g/dL — ABNORMAL LOW (ref 13.0–17.0)

## 2023-03-01 MED ORDER — SODIUM CHLORIDE 0.9% IV SOLUTION: Freq: Once | INTRAVENOUS | Status: DC

## 2023-03-01 MED ORDER — SODIUM CHLORIDE 0.9 % IV SOLN: INTRAVENOUS | Status: DC

## 2023-03-01 MED ORDER — PIVOT 1.5 CAL PO LIQD
1000.0000 mL | ORAL | Status: DC
  Administered 2023-03-01 (×2): 1000 mL

## 2023-03-01 MED ORDER — ZINC CHLORIDE 1 MG/ML IV SOLN
INTRAVENOUS | Status: AC
  Filled 2023-03-01: qty 1000.9

## 2023-03-01 MED ORDER — ALBUMIN HUMAN 5 % IV SOLN
12.5000 g | Freq: Once | INTRAVENOUS | Status: AC
  Administered 2023-03-01: 12.5 g via INTRAVENOUS
  Filled 2023-03-01: qty 250

## 2023-03-01 NOTE — Progress Notes (Signed)
[facility] NEPHROLOGY PROGRESS NOTE  Assessment/ Plan: Pt is a 67 y.o. yo male  who was initially admitted with SBO underwent multiple abdominal surgeries, on TPN, GI bleed with massive blood product transfusion, hypotension/shock, significant hyperbilirubinemia, now with azotemia.   # Acute kidney injury with azotemia with prolonged hospitalization, multiple abdominal surgeries, hemodynamic changes with reduced renal perfusion. High BUN is due to hypercatabolic state concomitant with multiple blood product transfusion, GI bleed, TPN etc. The BUN level is trending down with IV fluid.  He is nonoliguric. I will switch fluid to NS and closely monitor labs.  I had a long discussion with the patient's wife yesterday at bedside about overall long-term poor prognosis and possibly not a candidate for outpatient dialysis.  # Metabolic acidosis improved with IV sodium bicarb.  Fluid switch to NS.  # SBO status post multiple laparotomies, ileostomy placement: Currently on TPN and managed by general surgery team.  # GI bleed: Monitor CBC and transfuse as needed.  # Severe protein calorie malnutrition: TPN, albumin.  Subjective: Seen and examined at bedside.  Patient is icteric and somnolent but able to open eyes with the name.  Urine output is recorded 2.6 L.  No other major health event. Objective Vital signs in last 24 hours: Vitals:   03/01/23 0900 03/01/23 0915 03/01/23 0930 03/01/23 0945  BP: (!) 94/54 (!) 96/51 (!) 96/56 (!) 109/57  Pulse: 99 99 (!) 105 88  Resp: 15 15 18  (!) 22  Temp:      TempSrc:      SpO2: 98% 98% 98% 91%  Weight:      Height:       Weight change:   Intake/Output Summary (Last 24 hours) at 03/01/2023 1017 Last data filed at 03/01/2023 0800 Gross per 24 hour  Intake 4918.4 ml  Output 4630 ml  Net 288.4 ml       Labs: RENAL PANEL Recent Labs  Lab 02/23/23 0513 02/24/23 0540 02/25/23 0421 02/26/23 0633 02/27/23 0650 02/28/23 0620  03/01/23 0629  NA 141 141 144 144 146* 144 145  K 3.8 4.2 4.4 4.4 4.5 4.4 4.1  CL 116* 117* 118* 120* 118* 112* 107  CO2 14* 16* 17* 16* 19* 22 26  GLUCOSE 126* 111* 128* 121* 134* 138* 127*  BUN 73* 88* 96* 105* 111* 111* 97*  CREATININE 1.56* 1.53* 1.34* 1.32* 1.27* 1.38* 1.42*  CALCIUM 8.1* 8.6* 8.8* 8.7* 8.2* 8.3* 8.2*  MG 1.8 1.7 1.8 2.4 2.2  --  2.1  PHOS 2.7 3.2 4.4 5.0* 4.8*  --   --   ALBUMIN  --  2.1*  --   --  1.7*  --   --     Liver Function Tests: Recent Labs  Lab 02/24/23 0540 02/27/23 0650  AST 144* 257*  ALT 128* 228*  ALKPHOS 106 150*  BILITOT 26.2* 24.1*  PROT 6.0* 5.7*  ALBUMIN 2.1* 1.7*   No results for input(s): "LIPASE", "AMYLASE" in the last 168 hours. No results for input(s): "AMMONIA" in the last 168 hours. CBC: Recent Labs    01/21/23 0240 01/21/23 0630 02/27/23 0650 02/27/23 1537 02/28/23 0620 02/28/23 1411 03/01/23 0629  HGB  --    < > 8.5* 8.5* 7.9* 8.5* 7.9*  MCV  --    < > 87.2  --  86.4 85.3 86.0  RETICCTPCT 1.2  --   --   --   --   --   --    < > =  values in this interval not displayed.    Cardiac Enzymes: No results for input(s): "CKTOTAL", "CKMB", "CKMBINDEX", "TROPONINI" in the last 168 hours. CBG: Recent Labs  Lab 02/28/23 1603 02/28/23 1918 02/28/23 2349 03/01/23 0342 03/01/23 0800  GLUCAP 125* 121* 117* 106* 116*    Iron Studies: No results for input(s): "IRON", "TIBC", "TRANSFERRIN", "FERRITIN" in the last 72 hours. Studies/Results: CT ABDOMEN PELVIS WO CONTRAST  Result Date: 02/27/2023 CLINICAL DATA:  Postoperative fluid collections EXAM: CT ABDOMEN AND PELVIS WITHOUT CONTRAST TECHNIQUE: Multidetector CT imaging of the abdomen and pelvis was performed following the standard protocol without IV contrast. RADIATION DOSE REDUCTION: This exam was performed according to the departmental dose-optimization program which includes automated exposure control, adjustment of the mA and/or kV according to patient size and/or use  of iterative reconstruction technique. COMPARISON:  02/18/2023 FINDINGS: Lower chest: Two small fluid and gas collections in the anterior right lower lobe measuring 17-18 mm (series 5/images 11 and 15), poorly evaluated. Small right pleural effusion with mild patchy right lower lobe opacity, likely combination of atelectasis and pneumonia. Mild left basilar atelectasis. Hepatobiliary: Unenhanced liver is unremarkable. Status post cholecystectomy. Indwelling T-tube. No intrahepatic or extrahepatic ductal dilatation. Pancreas: Within normal limits. Spleen: Within normal limits. Adrenals/Urinary Tract: Adrenal glands are within normal limits. Kidneys are within normal limits. No renal calculi or hydronephrosis. Bladder is notable for an indwelling Foley catheter with small volume nondependent gas. Stomach/Bowel: Enteric tube terminates in the proximal stomach with surgical clips the inferior gastric antrum. Postsurgical changes involving the bowel with suture line in the anterior mid abdomen (series 3/image 37) and right mid/lower abdomen (series 3/53). No evidence of bowel obstruction. Prior distended/stool-filled bowel in the right mid/lower abdomen is now decompressed. Indwelling percutaneous catheter. Vascular/Lymphatic: No evidence of abdominal aortic aneurysm. No suspicious abdominopelvic lymphadenopathy. Reproductive: Prostate is grossly unremarkable. Other: Small volume abdominopelvic ascites with scattered small foci of nondependent gas (for example, beneath the anterior abdominal wall on series 3/image 27), likely postsurgical. Overlying midline skin staples. Musculoskeletal: Mild degenerative changes of the lumbar spine. IMPRESSION: Extensive postsurgical changes involving the stomach and bowel, as above. Additional support hardware. Small volume abdominopelvic ascites with scattered small foci of nondependent gas, likely postsurgical. No drainable fluid collection/abscess. Patchy right lower lobe opacity,  suspicious for pneumonia, with small right pleural effusion. Possible small right lower lobe lung abscesses, incompletely visualized and poorly evaluated. Consider CT chest for further evaluation. Electronically Signed   By: Charline Bills M.D.   On: 02/27/2023 12:54    Medications: Infusions:  sodium chloride 10 mL/hr at 02/22/23 0129   sodium chloride     albumin human     feeding supplement (PIVOT 1.5 CAL) 1,000 mL (03/01/23 0958)   methocarbamol (ROBAXIN) IV 500 mg (03/01/23 0954)   norepinephrine (LEVOPHED) Adult infusion 7 mcg/min (03/01/23 0800)   TPN ADULT (ION) 95 mL/hr at 03/01/23 0800   TPN ADULT (ION)      Scheduled Medications:  Chlorhexidine Gluconate Cloth  6 each Topical Daily   free water  30 mL Per Tube Q12H   mouth rinse  15 mL Mouth Rinse 4 times per day   pantoprazole  40 mg Intravenous Q12H   sodium chloride flush  10-40 mL Intracatheter Q12H    have reviewed scheduled and prn medications.  Physical Exam: General: Critically ill looking cachectic, frail male, icterus++ Heart:RRR, s1s2 nl Lungs:clear b/l, no crackle Abdomen: Nondistended, ostomy present Extremities:No edema Neurology: Opens eyes with the name  Bradley Dominguez  03/01/2023,10:17 AM  LOS: 47 days

## 2023-03-01 NOTE — Progress Notes (Signed)
PHARMACY - TOTAL PARENTERAL NUTRITION CONSULT NOTE  Indication: Prolonged ileus, intolerance to enteral feeding  Patient Measurements: Height: 6\' 1"  (185.4 cm) Weight: 59.2 kg (130 lb 8.2 oz) IBW/kg (Calculated) : 79.9 TPN AdjBW (KG): 65.5 Body mass index is 17.22 kg/m.  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 Now s/p duodenal bypass complicated due to adhesions and resulted in small common bile duct injury requiring T-tube placement and external duodenostomy. Patient with massive bleed/hemorrhagic shock, s/p MTP. TPN initiated 4/9-5/6, then transitioned to TF. TF then held 5/14 given small bowel distension secondary to suspected ileus and likely dysmotility. Pharmacy consulted to resume TPN 5/16.  Glucose / Insulin: no hx DM - CBGs < 180.  No SSI. Electrolytes: Na high normal at 145 (none in TPN, FW 30mL BID for Jtube patency added), others WNL Renal: SCr back up to 1.42 (BL ~0.7-0.9), BUN down to 97 since protein reduction in TPN Hepatic: 5/23: LFTs uptrending 257/228, T-bili: 24.1, Alk Phos: 150, TG WNL, albumin 1.7 Intake / Output; MIVF: UOP 1.9 ml/kg/hr, NGT , Biliary T-tube , LUQ JP drain minimal output, duodenostomy ; bicarb gtt 62mL/hr  GI Imaging:  5/23 CT abd pelvis - post-surgical changes, questionable pneumonia  5/14 CT abd pelvis - feculent material throughout the SB/abdomen, suspicious for SB obstruction, bowel perforation/dehiscence, interval development of gas-containing fluid collection in R paracolic gutter 5/3 CT abdomen: bowel edema, no cause identified for T-bili elevation  5/3 cholangiogram: mild dilation of bilary tract   4/4 UGI: 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 4/23 - Takeback for bleeding with hemorrhagic shock, resection of ischemic transverse colon, left in discontinuity with Abthera 4/24 - Takeback for bleeding, abdominal washout, ileocecectomy, pack placement, placement of Abthera 4/25 - Takeback, resection of 10cm necrotic distal ileum, washout, removal of packs, placement of Abthera 4/27 - Takeback, placement of J tube, end ileostomy, abdominal closure  Central access: triple lumen PICC placed 01/23/23 TPN start date: 01/14/23-02/10/23, 5/16>>  Nutritional Goals: Goal TPN rate is 95 mL/hr (provides 134 g of protein and 2307 kcals per day)  RD Estimated Needs Total Energy Estimated Needs: 2300 - 2600 - discussed with Pharmacy try to meet higher end of needs Total Protein Estimated Needs: 130-145 grams Total Fluid Estimated Needs: 2.1L/day  Current Nutrition:  TPN Pivot 1.5 at 2mL/hr via Jtube  Plan:  Discussed with CCS, Renal, and RD on 5/23. Reducing protein amount in an attempt to limit BUN increase. Aware of high lipid content, 30% of total calories but over 1 g/kg/day.  CHO and ILE content maximized to meet caloric needs.    Continue TPN at 95 ml/hr to provide 100g AA, 417g CHO (GIR 4.89), and 78g ILE for a total of 2598 kCal, meeting 100% of caloric needs.  Electrolytes in TPN: Na 76mEq/L since 5/17, K 30 mEq/L, Ca 73mEq/L, Mag 67mEq/L, Phos 77mmol/L since 5/16, max acetate.  Add standard MVI to TPN.  Remove standard trace elements with elevated Tbili and add back zinc 5mg , continue to hold chromium, and selenium.  Monitor standard TPN labs on Mon/Thurs (daily BMET through 5/28 per MD) F/u ability to increase trickle feeds.   Bradley Dominguez, PharmD, BCPS, BCCCP 03/01/2023, 7:58 AM

## 2023-03-01 NOTE — Progress Notes (Signed)
Patient ID: Bradley Dominguez, male   DOB: 09-17-1956, 67 y.o.   MRN: [id number] 28 Days Post-Op    Subjective: No new complaint ROS negative except as listed above. Objective: Vital signs in last 24 hours: Temp:  [97.2 F (36.2 C)-98.5 F (36.9 C)] 98.5 F (36.9 C) (05/25 0400) Pulse Rate:  [87-208] 103 (05/25 0600) Resp:  [12-31] 16 (05/25 0600) BP: (81-113)/(45-63) 99/58 (05/25 0600) SpO2:  [89 %-100 %] 99 % (05/25 0600) Last BM Date : 02/28/23  Intake/Output from previous day: 05/24 0701 - 05/25 0700 In: 5321.6 [I.V.:4135.4; Blood:320; NG/GT:300; IV Piggyback:566.2] Out: 4590 [Urine:2675; Emesis/NG output:825; Drains:665; Stool:425] Intake/Output this shift: No intake/output data recorded.  General appearance: alert and cooperative Resp: clear to auscultation bilaterally GI: stable, D tube T tube working, Ostomy with output, JP working, J tube Extremities: no edema  Lab Results: CBC  Recent Labs    02/28/23 1411 03/01/23 0629  WBC 20.5* 17.1*  HGB 8.5* 7.9*  HCT 24.9* 22.7*  PLT 203 194   BMET Recent Labs    02/28/23 0620 03/01/23 0629  NA 144 145  K 4.4 4.1  CL 112* 107  CO2 22 26  GLUCOSE 138* 127*  BUN 111* 97*  CREATININE 1.38* 1.42*  CALCIUM 8.3* 8.2*   PT/INR No results for input(s): "LABPROT", "INR" in the last 72 hours. ABG No results for input(s): "PHART", "HCO3" in the last 72 hours.  Invalid input(s): "PCO2", "PO2"  Studies/Results: CT ABDOMEN PELVIS WO CONTRAST  Result Date: 02/27/2023 CLINICAL DATA:  Postoperative fluid collections EXAM: CT ABDOMEN AND PELVIS WITHOUT CONTRAST TECHNIQUE: Multidetector CT imaging of the abdomen and pelvis was performed following the standard protocol without IV contrast. RADIATION DOSE REDUCTION: This exam was performed according to the departmental dose-optimization program which includes automated exposure control, adjustment of the mA and/or kV according to patient size and/or use of iterative  reconstruction technique. COMPARISON:  02/18/2023 FINDINGS: Lower chest: Two small fluid and gas collections in the anterior right lower lobe measuring 17-18 mm (series 5/images 11 and 15), poorly evaluated. Small right pleural effusion with mild patchy right lower lobe opacity, likely combination of atelectasis and pneumonia. Mild left basilar atelectasis. Hepatobiliary: Unenhanced liver is unremarkable. Status post cholecystectomy. Indwelling T-tube. No intrahepatic or extrahepatic ductal dilatation. Pancreas: Within normal limits. Spleen: Within normal limits. Adrenals/Urinary Tract: Adrenal glands are within normal limits. Kidneys are within normal limits. No renal calculi or hydronephrosis. Bladder is notable for an indwelling Foley catheter with small volume nondependent gas. Stomach/Bowel: Enteric tube terminates in the proximal stomach with surgical clips the inferior gastric antrum. Postsurgical changes involving the bowel with suture line in the anterior mid abdomen (series 3/image 37) and right mid/lower abdomen (series 3/53). No evidence of bowel obstruction. Prior distended/stool-filled bowel in the right mid/lower abdomen is now decompressed. Indwelling percutaneous catheter. Vascular/Lymphatic: No evidence of abdominal aortic aneurysm. No suspicious abdominopelvic lymphadenopathy. Reproductive: Prostate is grossly unremarkable. Other: Small volume abdominopelvic ascites with scattered small foci of nondependent gas (for example, beneath the anterior abdominal wall on series 3/image 27), likely postsurgical. Overlying midline skin staples. Musculoskeletal: Mild degenerative changes of the lumbar spine. IMPRESSION: Extensive postsurgical changes involving the stomach and bowel, as above. Additional support hardware. Small volume abdominopelvic ascites with scattered small foci of nondependent gas, likely postsurgical. No drainable fluid collection/abscess. Patchy right lower lobe opacity, suspicious for  pneumonia, with small right pleural effusion. Possible small right lower lobe lung abscesses, incompletely visualized and poorly evaluated. Consider CT chest for  further evaluation. Electronically Signed   By: Charline Bills M.D.   On: 02/27/2023 12:54    Anti-infectives: Anti-infectives (From admission, onward)    Start     Dose/Rate Route Frequency Ordered Stop   02/05/23 1345  ciprofloxacin (CIPRO) IVPB 400 mg        400 mg 200 mL/hr over 60 Minutes Intravenous Every 12 hours 02/05/23 1259 02/11/23 2217   01/28/23 1000  piperacillin-tazobactam (ZOSYN) IVPB 3.375 g  Status:  Discontinued        3.375 g 12.5 mL/hr over 240 Minutes Intravenous Every 8 hours 01/28/23 0902 02/05/23 1259   01/27/23 1030  ceFAZolin (ANCEF) IVPB 2g/100 mL premix       See Hyperspace for full Linked Orders Report.   2 g 200 mL/hr over 30 Minutes Intravenous On call to O.R. 01/27/23 0746 01/27/23 1135   01/27/23 1030  metroNIDAZOLE (FLAGYL) IVPB 500 mg       See Hyperspace for full Linked Orders Report.   500 mg 100 mL/hr over 60 Minutes Intravenous On call to O.R. 01/27/23 0746 01/27/23 1141   01/19/23 0600  cefTRIAXone (ROCEPHIN) 2 g in sodium chloride 0.9 % 100 mL IVPB       See Hyperspace for full Linked Orders Report.   2 g 200 mL/hr over 30 Minutes Intravenous On call to O.R. 01/19/23 0350 01/19/23 0606   01/19/23 0350  metroNIDAZOLE (FLAGYL) IVPB 500 mg  Status:  Discontinued       See Hyperspace for full Linked Orders Report.   500 mg 100 mL/hr over 60 Minutes Intravenous Every 8 hours 01/19/23 0350 01/19/23 0752       Assessment/Plan: 67 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  4/22 - Ex lap, adhesiolysis, duodenojejunal bypass, duodenostomy tube placement, T tube placement in common bile duct. 4/23 - Takeback for bleeding with hemorrhagic shock, resection of ischemic transverse colon, left in discontinuity with Abthera 4/24 - Takeback for bleeding, abdominal washout, ileocecectomy, pack  placement, placement of Abthera 4/25 - Takeback, resection of 10cm necrotic distal ileum, washout, removal of packs, placement of Abthera 4/27 - Takeback, placement of J tube, end ileostomy, abdominal closure   - Hyperbilirubinemia: Tube cholangiogram on 5/3 showed patency of the biliary tree with no obstruction. Likely multifactorial due to massive transfusion with hemolysis and shock liver. Bilirubin is very slowly downtrending. Synthetic function is normal. Continue trending LFTs twice weekly. - Keep T tube and D tube to gravity drainage. 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 - Pain: prn dilaudid - Wound care: Saline wet-to-dry to midline wound - ID: WBC down to 17.1, no intraabdominal collections on CT scan. Concern for pneumonia RLL, patient was previously treated for pneumonia, currently no clinical signs of respiratory distress, productive cough or fevers. Hold on abx at this time. - FEN: NPO, ok for ice chips.  Continue full strength TPN and NG decompression. Continue trophic feeds, will plan to advance very slowly to 20/h  today. BUN down to 97. Protein load in TPN decreased. Slow GI bleed likely contributing. Appreciate nephrology assistance, may need to consider RRT if worsens but is improving now - AKI: Improving. - Urinary retention: Foley replaced on 5/20 for second time for urinary retention. Keep in place. - WOC following for ostomy care and teaching - PT/OT following. Patient is profoundly deconditioned. - GI bleeding: Dark blood present in NG and ileostomy output. hgb, 7.9 today.  - VTE: SCDs, lovenox on hold given recent GI bleeding.  - Dispo:  ICU I spoke with his wife.  LOS: 47 days    Violeta Gelinas, MD, MPH, FACS Trauma & General Surgery Use [email or web address] to contact on call provider  03/01/2023

## 2023-03-02 LAB — CBC
HCT: 25.3 % — ABNORMAL LOW (ref 39.0–52.0)
Hemoglobin: 8.6 g/dL — ABNORMAL LOW (ref 13.0–17.0)
MCH: 29.2 pg (ref 26.0–34.0)
MCHC: 34 g/dL (ref 30.0–36.0)
MCV: 85.8 fL (ref 80.0–100.0)
Platelets: 212 10*3/uL (ref 150–400)
RBC: 2.95 MIL/uL — ABNORMAL LOW (ref 4.22–5.81)
RDW: 24.3 % — ABNORMAL HIGH (ref 11.5–15.5)
WBC: 17.2 10*3/uL — ABNORMAL HIGH (ref 4.0–10.5)
nRBC: 0 % (ref 0.0–0.2)

## 2023-03-02 LAB — BPAM RBC: Blood Product Expiration Date: 202406052359

## 2023-03-02 LAB — GLUCOSE, CAPILLARY
Glucose-Capillary: 101 mg/dL — ABNORMAL HIGH (ref 70–99)
Glucose-Capillary: 103 mg/dL — ABNORMAL HIGH (ref 70–99)
Glucose-Capillary: 114 mg/dL — ABNORMAL HIGH (ref 70–99)
Glucose-Capillary: 115 mg/dL — ABNORMAL HIGH (ref 70–99)
Glucose-Capillary: 101 mg/dL — ABNORMAL HIGH (ref 70–99)
Glucose-Capillary: 115 mg/dL — ABNORMAL HIGH (ref 70–99)

## 2023-03-02 LAB — BASIC METABOLIC PANEL
Anion gap: 12 (ref 5–15)
BUN: 77 mg/dL — ABNORMAL HIGH (ref 8–23)
CO2: 22 mmol/L (ref 22–32)
Calcium: 7.8 mg/dL — ABNORMAL LOW (ref 8.9–10.3)
Chloride: 107 mmol/L (ref 98–111)
Creatinine, Ser: 1.29 mg/dL — ABNORMAL HIGH (ref 0.61–1.24)
GFR, Estimated: >60 mL/min (ref >60)
Glucose, Bld: 117 mg/dL — ABNORMAL HIGH (ref 70–99)
Potassium: 3.4 mmol/L — ABNORMAL LOW (ref 3.5–5.1)
Sodium: 141 mmol/L (ref 135–145)

## 2023-03-02 LAB — TYPE AND SCREEN: Antibody Screen: NEGATIVE

## 2023-03-02 LAB — PHOSPHORUS: Phosphorus: 4.6 mg/dL (ref 2.5–4.6)

## 2023-03-02 MED ORDER — PIVOT 1.5 CAL PO LIQD: 1000.0000 mL | ORAL | Status: DC

## 2023-03-02 MED ORDER — POTASSIUM CHLORIDE 20 MEQ PO PACK
40.0000 meq | PACK | Freq: Once | ORAL | Status: AC
  Administered 2023-03-02: 40 meq
  Filled 2023-03-02: qty 2

## 2023-03-02 MED ORDER — ZINC CHLORIDE 1 MG/ML IV SOLN
INTRAVENOUS | Status: AC
  Filled 2023-03-02: qty 1000.9

## 2023-03-02 MED ORDER — POTASSIUM CHLORIDE 10 MEQ/50ML IV SOLN
10.0000 meq | INTRAVENOUS | Status: AC
  Administered 2023-03-02 (×2): 10 meq via INTRAVENOUS
  Filled 2023-03-02 (×2): qty 50

## 2023-03-02 MED ORDER — PIVOT 1.5 CAL PO LIQD
1000.0000 mL | ORAL | Status: DC
  Administered 2023-03-02: 1000 mL

## 2023-03-02 NOTE — Progress Notes (Signed)
PHARMACY - TOTAL PARENTERAL NUTRITION CONSULT NOTE  Indication: Prolonged ileus, intolerance to enteral feeding  Patient Measurements: Height: 6\' 1"  (185.4 cm) Weight: 61 kg (134 lb 7.7 oz) IBW/kg (Calculated) : 79.9 TPN AdjBW (KG): 65.5 Body mass index is 17.74 kg/m.  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 Now s/p duodenal bypass complicated due to adhesions and resulted in small common bile duct injury requiring T-tube placement and external duodenostomy. Patient with massive bleed/hemorrhagic shock, s/p MTP. TPN initiated 4/9-5/6, then transitioned to TF. TF then held 5/14 given small bowel distension secondary to suspected ileus and likely dysmotility. Pharmacy consulted to resume TPN 5/16.  Glucose / Insulin: no hx DM - CBGs < 180.  No SSI. Electrolytes: K 3.4 (goal >/= 4 for ileus), Na down to 141 (none in TPN, FW 30mL BID for Jtube patency), Phos down to 4.6 (none in TPN), others WNL Renal: SCr down to 1.29 (BL ~0.7-0.9), BUN down to 77 since protein reduction in TPN Hepatic: 5/23: LFTs uptrending 257/228, T-bili: 24.1, Alk Phos: 150, TG WNL, albumin 1.7 Intake / Output; MIVF: UOP 2.4 ml/kg/hr, NGT , Biliary T-tube , LUQ JP drain minimal output, duodenostomy ;  bicarb >> NS at 75 ml/hr GI Imaging:  5/23 CT abd pelvis - post-surgical changes, questionable pneumonia  5/14 CT abd pelvis - feculent material throughout the SB/abdomen, suspicious for SB obstruction, bowel perforation/dehiscence, interval development of gas-containing fluid collection in R paracolic gutter 5/3 CT abdomen: bowel edema, no cause identified for T-bili elevation  5/3 cholangiogram: mild dilation of bilary tract   4/4 UGI: 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 4/23 - Takeback for bleeding with hemorrhagic shock, resection of ischemic transverse colon, left in discontinuity with Abthera 4/24 - Takeback for bleeding, abdominal washout, ileocecectomy, pack placement, placement of Abthera 4/25 - Takeback, resection of 10cm necrotic distal ileum, washout, removal of packs, placement of Abthera 4/27 - Takeback, placement of J tube, end ileostomy, abdominal closure  Central access: triple lumen PICC placed 01/23/23 TPN start date: 01/14/23-02/10/23, 5/16>>  Nutritional Goals: Goal TPN rate is 95 mL/hr (provides 134 g of protein and 2307 kcals per day)  RD Estimated Needs Total Energy Estimated Needs: 2300 - 2600 - discussed with Pharmacy try to meet higher end of needs Total Protein Estimated Needs: 130-145 grams Total Fluid Estimated Needs: 2.1L/day  Current Nutrition:  TPN Pivot 1.5 at 18mL/hr via Jtube (goal rate 70 ml/hr)  Plan:  Discussed with CCS, Renal, and RD on 5/23. Reducing protein amount in an attempt to limit BUN increase. Aware of high lipid content, 30% of total calories but over 1 g/kg/day.  CHO and ILE content maximized to meet caloric needs.    Continue TPN at 95 ml/hr to provide 100g AA, 417g CHO (GIR 4.89), and 78g ILE for a total of 2598 kCal, meeting 100% of caloric needs.  Electrolytes in TPN: Na 29mEq/L since 5/17, increase K to 23mEq/L (conservative with fluctuating SCr, was reduced on 5/23), Ca 31mEq/L, Mag 18mEq/L, Phos 85mmol/L since 5/16, max acetate  Add standard MVI to TPN.  Remove standard trace elements with elevated Tbili and add back zinc 5mg , continue to hold chromium, and selenium.  NS at 75 ml/hr per MD  KCL  x 2 runs and try PT Monitor standard TPN labs on Mon/Thurs (daily BMET through 5/28 per MD) F/u ability to increase TF further, hope to begin weaning TPN soon  Nanetta Wiegman D. Laney Potash, PharmD, BCPS, BCCCP 03/02/2023, 7:25 AM

## 2023-03-02 NOTE — Progress Notes (Addendum)
[facility] NEPHROLOGY PROGRESS NOTE  Assessment/ Plan: Pt is a 67 y.o. yo male  who was initially admitted with SBO underwent multiple abdominal surgeries, on TPN, GI bleed with massive blood product transfusion, hypotension/shock, significant hyperbilirubinemia, now with azotemia.   # Acute kidney injury with azotemia with prolonged hospitalization, multiple abdominal surgeries, hemodynamic changes with reduced renal perfusion. High BUN is due to hypercatabolic state concomitant with multiple blood product transfusion, GI bleed, TPN etc. Both BUN and creatinine level is trending down nicely with IV fluids.  Urine output has increased.  Continue current management, strict ins and out and daily lab.   I have discussed with the patient's wife on 5/24 about overall long-term poor prognosis and possibly not a candidate for outpatient dialysis.  # Metabolic acidosis improved with IV sodium bicarb.  Fluid switched to NS.  # SBO status post multiple laparotomies, ileostomy placement: Currently on TPN and managed by general surgery team.  # GI bleed: Monitor CBC and transfuse as needed.  # Severe protein calorie malnutrition: TPN, albumin.  # Hypokalemia: Repleted potassium.  Monitor lab.  Subjective: Seen and examined at bedside.  The patient's brother was present at the bedside.  The urine output is recorded around 3.5 L.  No overall change in clinical condition. Objective Vital signs in last 24 hours: Vitals:   03/02/23 0815 03/02/23 0830 03/02/23 0845 03/02/23 0900  BP: 103/60 (!) 98/59 (!) 99/58 111/63  Pulse: 96 88 95 (!) 101  Resp: 16 17 15 19   Temp:      TempSrc:      SpO2: 98% 99% 99% 99%  Weight:      Height:       Weight change: 1.8 kg  Intake/Output Summary (Last 24 hours) at 03/02/2023 0952 Last data filed at 03/02/2023 0900 Gross per 24 hour  Intake 5803.09 ml  Output 5526 ml  Net 277.09 ml        Labs: RENAL PANEL Recent Labs  Lab  02/24/23 0540 02/25/23 0421 02/26/23 8657 02/27/23 0650 02/28/23 0620 03/01/23 0629 03/02/23 0410  NA 141 144 144 146* 144 145 141  K 4.2 4.4 4.4 4.5 4.4 4.1 3.4*  CL 117* 118* 120* 118* 112* 107 107  CO2 16* 17* 16* 19* 22 26 22   GLUCOSE 111* 128* 121* 134* 138* 127* 117*  BUN 88* 96* 105* 111* 111* 97* 77*  CREATININE 1.53* 1.34* 1.32* 1.27* 1.38* 1.42* 1.29*  CALCIUM 8.6* 8.8* 8.7* 8.2* 8.3* 8.2* 7.8*  MG 1.7 1.8 2.4 2.2  --  2.1  --   PHOS 3.2 4.4 5.0* 4.8*  --   --  4.6  ALBUMIN 2.1*  --   --  1.7*  --   --   --      Liver Function Tests: Recent Labs  Lab 02/24/23 0540 02/27/23 0650  AST 144* 257*  ALT 128* 228*  ALKPHOS 106 150*  BILITOT 26.2* 24.1*  PROT 6.0* 5.7*  ALBUMIN 2.1* 1.7*    No results for input(s): "LIPASE", "AMYLASE" in the last 168 hours. No results for input(s): "AMMONIA" in the last 168 hours. CBC: Recent Labs    01/21/23 0240 01/21/23 0630 02/28/23 1411 03/01/23 0629 03/01/23 1419 03/01/23 2158 03/02/23 0410  HGB  --    < > 8.5* 7.9* 7.1* 8.6* 8.6*  MCV  --    < > 85.3 86.0 89.3  --  85.8  RETICCTPCT 1.2  --   --   --   --   --   --    < > =  values in this interval not displayed.     Cardiac Enzymes: No results for input(s): "CKTOTAL", "CKMB", "CKMBINDEX", "TROPONINI" in the last 168 hours. CBG: Recent Labs  Lab 03/01/23 1544 03/01/23 2005 03/01/23 2327 03/02/23 0408 03/02/23 0734  GLUCAP 113* 118* 125* 101* 103*     Iron Studies: No results for input(s): "IRON", "TIBC", "TRANSFERRIN", "FERRITIN" in the last 72 hours. Studies/Results: No results found.  Medications: Infusions:  sodium chloride 10 mL/hr at 02/22/23 0129   sodium chloride 75 mL/hr at 03/02/23 0900   methocarbamol (ROBAXIN) IV Stopped (03/02/23 0659)   norepinephrine (LEVOPHED) Adult infusion 7 mcg/min (03/02/23 0900)   potassium chloride 50 mL/hr at 03/02/23 0900   TPN ADULT (ION) 95 mL/hr at 03/02/23 0900   TPN ADULT (ION)      Scheduled  Medications:  sodium chloride   Intravenous Once   Chlorhexidine Gluconate Cloth  6 each Topical Daily   feeding supplement (PIVOT 1.5 CAL)  1,000 mL Per Tube Q24H   free water  30 mL Per Tube Q12H   mouth rinse  15 mL Mouth Rinse 4 times per day   pantoprazole  40 mg Intravenous Q12H   sodium chloride flush  10-40 mL Intracatheter Q12H    have reviewed scheduled and prn medications.  Physical Exam: General: Critically ill looking cachectic, frail male, icterus++ Heart:RRR, s1s2 nl Lungs:clear b/l, no crackle Abdomen: Nondistended, ostomy present Extremities:No edema Neurology: Opens eyes with the name  Tiahna Cure Prasad Phillis Thackeray 03/02/2023,9:52 AM  LOS: 48 days

## 2023-03-02 NOTE — Progress Notes (Signed)
Follow up - Trauma and Critical Care  Patient Details:    Bradley Dominguez is an 67 y.o. male.  Anti-infectives:  Anti-infectives (From admission, onward)    Start     Dose/Rate Route Frequency Ordered Stop   02/05/23 1345  ciprofloxacin (CIPRO) IVPB 400 mg        400 mg 200 mL/hr over 60 Minutes Intravenous Every 12 hours 02/05/23 1259 02/11/23 2217   01/28/23 1000  piperacillin-tazobactam (ZOSYN) IVPB 3.375 g  Status:  Discontinued        3.375 g 12.5 mL/hr over 240 Minutes Intravenous Every 8 hours 01/28/23 0902 02/05/23 1259   01/27/23 1030  ceFAZolin (ANCEF) IVPB 2g/100 mL premix       See Hyperspace for full Linked Orders Report.   2 g 200 mL/hr over 30 Minutes Intravenous On call to O.R. 01/27/23 0746 01/27/23 1135   01/27/23 1030  metroNIDAZOLE (FLAGYL) IVPB 500 mg       See Hyperspace for full Linked Orders Report.   500 mg 100 mL/hr over 60 Minutes Intravenous On call to O.R. 01/27/23 0746 01/27/23 1141   01/19/23 0600  cefTRIAXone (ROCEPHIN) 2 g in sodium chloride 0.9 % 100 mL IVPB       See Hyperspace for full Linked Orders Report.   2 g 200 mL/hr over 30 Minutes Intravenous On call to O.R. 01/19/23 0350 01/19/23 0606   01/19/23 0350  metroNIDAZOLE (FLAGYL) IVPB 500 mg  Status:  Discontinued       See Hyperspace for full Linked Orders Report.   500 mg 100 mL/hr over 60 Minutes Intravenous Every 8 hours 01/19/23 0350 01/19/23 1610       Consults:    Chief Complaint/Subjective:    Overnight Issues: Anemia worsened and received 1 unit of pRBC. Continued levophed requirement  Objective:  Vital signs for last 24 hours: Temp:  [97.9 F (36.6 C)-98.9 F (37.2 C)] 98.9 F (37.2 C) (05/26 0800) Pulse Rate:  [80-107] 101 (05/26 0900) Resp:  [12-29] 19 (05/26 0900) BP: (84-132)/(45-88) 111/63 (05/26 0900) SpO2:  [91 %-100 %] 99 % (05/26 0900) Weight:  [61 kg] 61 kg (05/26 0500)  Hemodynamic parameters for last 24 hours:    Intake/Output from previous  day: 05/25 0701 - 05/26 0700 In: 5676.3 [I.V.:4751.4; Blood:132.5; NG/GT:550.3; IV Piggyback:242.1] Out: 5451 [Urine:3550; Emesis/NG output:250; Drains:1071; Stool:580]   Vent settings for last 24 hours:    Physical Exam:  Gen: arousable, emaciated HEENT: NG tube in position, fat wasting Resp: tachypneic Cardiovascular: tachycardic Abdomen: soft, incision intact with some granulation tissue, drains with bilious output, ostomy with dark output Ext: no edema Neuro: answers simple questions   Assessment/Plan:  67 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   4/22 - Ex lap, adhesiolysis, duodenojejunal bypass, duodenostomy tube placement, T tube placement in common bile duct. 4/23 - Takeback for bleeding with hemorrhagic shock, resection of ischemic transverse colon, left in discontinuity with Abthera 4/24 - Takeback for bleeding, abdominal washout, ileocecectomy, pack placement, placement of Abthera 4/25 - Takeback, resection of 10cm necrotic distal ileum, washout, removal of packs, placement of Abthera 4/27 - Takeback, placement of J tube, end ileostomy, abdominal closure   - Hyperbilirubinemia: Tube cholangiogram on 5/3 showed patency of the biliary tree with no obstruction. Likely multifactorial due to massive transfusion with hemolysis and shock liver. Bilirubin is very slowly downtrending. Synthetic function is normal. Continue trending LFTs twice weekly. - Keep T tube and D tube to gravity drainage. 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 - Pain: prn dilaudid, decreased dose - Wound care: Saline wet-to-dry to midline wound - ID: WBC stable at 17.2, no intraabdominal collections on CT scan. Concern for pneumonia RLL, patient was previously treated for pneumonia, currently no clinical signs of respiratory distress, productive cough or fevers. Hold on abx at this time. - FEN: NPO, ok for ice chips.  Continue full strength TPN and NG decompression. Continue trophic feeds, will plan  to advance very slowly to 30/h  today. BUN down to 97. Protein load in TPN decreased. Slow GI bleed likely contributing. Appreciate nephrology assistance, may need to consider RRT if worsens but is improving now - AKI: Improving. - Urinary retention: Foley replaced on 5/20 for second time for urinary retention. Keep in place. - WOC following for ostomy care and teaching - PT/OT following. Patient is profoundly deconditioned. - GI bleeding: Dark blood present in NG and ileostomy output. hgb, 8.6 today, 1 unit given 5/25 with appropriate response.  - VTE: SCDs, lovenox on hold given recent GI bleeding.  - Dispo: ICU   LOS: 48 days   Additional comments: I discussed the patient with his brother. I reviewed labs showing BUN downtrending and Cr improvement with good UOP. Hgb with appropriate rise after transfusion yesterday and stable from overnight to this morning. NG output and ostomy concerning for blood. No tube feeds present in d tube or NG tube so will advance tube feeds  Critical Care Total Time*: 35 minutes  De Blanch Aleena Kirkeby 03/02/2023  *Care during the described time interval was provided by me and/or other providers on the critical care team.  I have reviewed this patient's available data, including medical history, events of note, physical examination and test results as part of my evaluation.

## 2023-03-02 NOTE — Progress Notes (Signed)
Patient's family member requesting no 0400 CBG check due to patient sleeping. This RN will attempt to obtain CBG during next patient care tasks.

## 2023-03-03 LAB — CBC
HCT: 27.6 % — ABNORMAL LOW (ref 39.0–52.0)
Hemoglobin: 9.1 g/dL — ABNORMAL LOW (ref 13.0–17.0)
MCH: 29.4 pg (ref 26.0–34.0)
MCHC: 33 g/dL (ref 30.0–36.0)
MCV: 89.3 fL (ref 80.0–100.0)
Platelets: 198 10*3/uL (ref 150–400)
RBC: 3.09 MIL/uL — ABNORMAL LOW (ref 4.22–5.81)
RDW: 26.3 % — ABNORMAL HIGH (ref 11.5–15.5)
WBC: 15.2 10*3/uL — ABNORMAL HIGH (ref 4.0–10.5)
nRBC: 0 % (ref 0.0–0.2)

## 2023-03-03 LAB — TYPE AND SCREEN
Antibody Screen: NEGATIVE
Unit division: 0

## 2023-03-03 LAB — PHOSPHORUS: Phosphorus: 4.8 mg/dL — ABNORMAL HIGH (ref 2.5–4.6)

## 2023-03-03 LAB — COMPREHENSIVE METABOLIC PANEL
ALT: 247 U/L — ABNORMAL HIGH (ref 0–44)
AST: 246 U/L — ABNORMAL HIGH (ref 15–41)
Albumin: 1.9 g/dL — ABNORMAL LOW (ref 3.5–5.0)
Alkaline Phosphatase: 173 U/L — ABNORMAL HIGH (ref 38–126)
Anion gap: 10 (ref 5–15)
BUN: 64 mg/dL — ABNORMAL HIGH (ref 8–23)
CO2: 20 mmol/L — ABNORMAL LOW (ref 22–32)
Calcium: 7.9 mg/dL — ABNORMAL LOW (ref 8.9–10.3)
Chloride: 114 mmol/L — ABNORMAL HIGH (ref 98–111)
Creatinine, Ser: 1.2 mg/dL (ref 0.61–1.24)
GFR, Estimated: >60 mL/min (ref >60)
Glucose, Bld: 102 mg/dL — ABNORMAL HIGH (ref 70–99)
Potassium: 3.7 mmol/L (ref 3.5–5.1)
Sodium: 144 mmol/L (ref 135–145)
Total Bilirubin: 28.4 mg/dL (ref 0.3–1.2)
Total Protein: 6.1 g/dL — ABNORMAL LOW (ref 6.5–8.1)

## 2023-03-03 LAB — GLUCOSE, CAPILLARY
Glucose-Capillary: 101 mg/dL — ABNORMAL HIGH (ref 70–99)
Glucose-Capillary: 106 mg/dL — ABNORMAL HIGH (ref 70–99)
Glucose-Capillary: 107 mg/dL — ABNORMAL HIGH (ref 70–99)
Glucose-Capillary: 113 mg/dL — ABNORMAL HIGH (ref 70–99)
Glucose-Capillary: 98 mg/dL (ref 70–99)
Glucose-Capillary: 98 mg/dL (ref 70–99)

## 2023-03-03 LAB — MAGNESIUM: Magnesium: 2 mg/dL (ref 1.7–2.4)

## 2023-03-03 LAB — TRIGLYCERIDES: Triglycerides: 113 mg/dL (ref <150)

## 2023-03-03 LAB — BPAM RBC

## 2023-03-03 MED ORDER — FREE WATER
200.0000 mL | Freq: Two times a day (BID) | Status: DC
  Administered 2023-03-03 – 2023-03-06 (×8): 200 mL

## 2023-03-03 MED ORDER — POTASSIUM CHLORIDE 20 MEQ PO PACK
60.0000 meq | PACK | Freq: Once | ORAL | Status: AC
  Administered 2023-03-03: 60 meq
  Filled 2023-03-03: qty 3

## 2023-03-03 MED ORDER — ALBUMIN HUMAN 5 % IV SOLN
12.5000 g | Freq: Once | INTRAVENOUS | Status: AC
  Administered 2023-03-03: 12.5 g via INTRAVENOUS
  Filled 2023-03-03: qty 250

## 2023-03-03 MED ORDER — ZINC CHLORIDE 1 MG/ML IV SOLN
INTRAVENOUS | Status: AC
  Filled 2023-03-03: qty 684.8

## 2023-03-03 MED ORDER — PIVOT 1.5 CAL PO LIQD
1000.0000 mL | ORAL | Status: DC
  Administered 2023-03-03 – 2023-03-04 (×2): 1000 mL

## 2023-03-03 NOTE — TOC Progression Note (Signed)
Transition of Care [facility]) - Progression Note    Patient Details  Name: Nhut Naqvi MRN: [id number] Date of Birth: Feb 07, 1956  [facility] of [facility]) CM/SW Contact  Mearl Latin, LCSW Phone Number: 03/03/2023, 4:46 PM  Clinical Narrative:    TOC continuing to follow for needs. Medical workup ongoing.      Barriers to Discharge: Continued Medical Work up  Expected Discharge Plan and Services                                               Social Determinants of Health (SDOH) Interventions SDOH Screenings   Food Insecurity: No Food Insecurity (01/13/2023)  Housing: Low Risk  (01/13/2023)  Transportation Needs: No Transportation Needs (01/13/2023)  Utilities: Not At Risk (01/13/2023)  Tobacco Use: Low Risk  (02/02/2023)    Readmission Risk Interventions     No data to display

## 2023-03-03 NOTE — Progress Notes (Signed)
PHARMACY - TOTAL PARENTERAL NUTRITION CONSULT NOTE  Indication: Prolonged ileus, intolerance to enteral feeding  Patient Measurements: Height: 6\' 1"  (185.4 cm) Weight: 61 kg (134 lb 7.7 oz) IBW/kg (Calculated) : 79.9 TPN AdjBW (KG): 65.5 Body mass index is 17.74 kg/m.  Assessment: 89 yom with hx intestinal surgery and subsequent SBO requiring additional resection, admitted 01/13/23 with recurrent issues with SBO at the duodenum. Now s/p duodenal bypass complicated due to adhesions and resulted in small common bile duct injury requiring T-tube placement and external duodenostomy. Patient with massive bleed/hemorrhagic shock, s/p MTP. TPN initiated 4/9-5/6, then transitioned to TF. TF then held 5/14 given small bowel distension secondary to suspected ileus and likely dysmotility. Pharmacy consulted to resume TPN 5/16.  Glucose / Insulin: no hx DM - CBGs < 180.  No SSI. Electrolytes: K 3.7 post (goal >/= 4 for ileus), Na 145 (none in TPN, FW 30mL BID for Jtube patency), high CL and low CO2, Phos elevated at 4.8 (none in TPN), others WNL Renal: SCr down to 1.2 (BL ~0.7-0.9), BUN down to 64 Hepatic: LFTs elevated and stable, tbili up to 28.4, TG WNL, albumin 1.9 Intake / Output; MIVF: UOP 1.9 ml/kg/hr, NGT , Biliary T-tube , LUQ JP drain minimal output, duodenostomy 0mL;  bicarb >> NS at 75 ml/hr GI Imaging:  5/23 CT abd pelvis - post-surgical changes, questionable pneumonia  5/14 CT abd pelvis - feculent material throughout the SB/abdomen, suspicious for SB obstruction, bowel perforation/dehiscence, interval development of gas-containing fluid collection in R paracolic gutter 5/3 CT abdomen: bowel edema, no cause identified for T-bili elevation  5/3 cholangiogram: mild dilation of bilary tract   4/4 UGI: Findings suggestive of chronic pSBO or ileus 4/9 CT: Ileus vs high grade partial SBO GI Surgeries / Procedures: 4/22 - Ex lap, adhesiolysis, duodenojejunal bypass, duodenostomy  tube placement, T tube placement in common bile duct. 4/23 - Takeback for bleeding with hemorrhagic shock, resection of ischemic transverse colon, left in discontinuity with Abthera 4/24 - Takeback for bleeding, abdominal washout, ileocecectomy, pack placement, placement of Abthera 4/25 - Takeback, resection of 10cm necrotic distal ileum, washout, removal of packs, placement of Abthera 4/27 - Takeback, placement of J tube, end ileostomy, abdominal closure  Central access: triple lumen PICC placed 01/23/23 TPN start date: 01/14/23-02/10/23, 5/16>>  Nutritional Goals: Goal TPN rate is 95 mL/hr (provides 134 g of protein and 2307 kcals per day)  RD Estimated Needs Total Energy Estimated Needs: 2300 - 2600 - discussed with Pharmacy try to meet higher end of needs Total Protein Estimated Needs: 130-145 grams Total Fluid Estimated Needs: 2.1L/day  Current Nutrition:  TPN Pivot 1.5 at 29mL/hr via Jtube (goal rate 70 ml/hr)  Plan:  Discussed with CCS, Renal, and RD on 5/23. Reducing protein amount in an attempt to limit BUN increase. Aware of high lipid content, 30% of total calories but over 1 g/kg/day.  CHO and ILE content maximized to meet caloric needs.    Pivot increased to 40 ml/hr = 1440 kCal and 90g AA, meeting > 50% of needs.  Discussed with MD, begin weaning TPN. TPN at 65 ml/hr at 1800 (goal rate 95 ml/hr), meeting ~70% of needs Electrolytes in TPN: Na 59mEq/L since 5/17, K 63mEq/L, Ca 22mEq/L, Mag 50mEq/L, Phos 18mmol/L since 5/16, max acetate - adjusted with reduced TPN rate Add standard MVI to TPN.  Remove standard trace elements with elevated Tbili and add back zinc 5mg , continue to hold chromium, and selenium.  NS at 75 ml/hr per  MD  KCL PT Monitor standard TPN labs on Mon/Thurs (daily BMET through 5/28 per MD) F/u TF tolerance/advancement to wean TPN further   [name] D. Laney Potash, PharmD, BCPS, BCCCP 03/03/2023, 8:45 AM

## 2023-03-03 NOTE — Progress Notes (Signed)
Occupational Therapy Treatment Patient Details Name: Bradley Dominguez MRN: [id number] DOB: Mar 07, 1956 Today's Date: 03/03/2023   History of present illness 67 yo M HF, chronic partial small bowel obstruction and multiple previous abd surgeries  who was admitted  4/8 from home w n/v/bloating. Underwent EGD 4/11 -- concerning for duodenal obstruction. Developed hemorrhagic shock 4/15 in the setting of hematemesis / hematochezia, decompensated requiring, intubation, embolization of jejunal arcade branch. Extubated 01/22/23. Underwent ex lap adhesiolysis, duodenojejunal bypass, duodenostomy tube placement, T tube placement in common bile duct 4/22, transferred to [facility] 4/23 with hemorrhagic shock, return to OR 4/23, 4/24, 4/25 and finally 4/27 with placement of J tube, end ileostomy, abdominal closure.  Extubated 4/30.  NGT placed 5/14 due to possible ileus.   OT comments  Patient progressing minimally with goals. Patient needing max encouragement in order to participate, but refusing EOB, and initially refusing postioning into chair position. Patient total A to complete grooming (refusing) but able to use yonker to complete suctioning independently. Max time and encouragement in order to squeeze blue foam block x5 with each hand (wife present and patient benefiting from her encouragement). Goals and frequency downgraded to reflect progress, with recommendation also downgraded to [facility] due to current level and need for significant assist once medically stable due to profound weakness.    Recommendations for follow up therapy are one component of a multi-disciplinary discharge planning process, led by the attending physician.  Recommendations may be updated based on patient status, additional functional criteria and insurance authorization.    Assistance Recommended at Discharge Frequent or constant Supervision/Assistance  Patient can return home with the following  A lot of help with walking and/or transfers;A  lot of help with bathing/dressing/bathroom;Assistance with cooking/housework;Assistance with feeding;Direct supervision/assist for medications management;Direct supervision/assist for financial management;Assist for transportation;Help with stairs or ramp for entrance   Equipment Recommendations  Other (comment) (defer to next venue)    Recommendations for Other Services      Precautions / Restrictions Precautions Precautions: Fall Restrictions Weight Bearing Restrictions: No       Mobility Bed Mobility Overal bed mobility: Needs Assistance Bed Mobility: Rolling, Supine to Sit Rolling: Max assist, +2 for physical assistance         General bed mobility comments: refusing much of anything due to wanting to rest with pain meds (per wife); rolled to placed new bed pad with increased time and cues; pulled up to sit with max A of 2 in long sitting to remove bed pad    Transfers Overall transfer level: Needs assistance                 General transfer comment: NT     Balance                                           ADL either performed or assessed with clinical judgement   ADL Overall ADL's : Needs assistance/impaired Eating/Feeding: NPO   Grooming: Total assistance Grooming Details (indicate cue type and reason): total to wash face to date                               General ADL Comments: Patient with decreased participation in session, max encouragement needed for basic positioning, self care tasks, and strengthening.    Extremity/Trunk Assessment  Vision       Perception     Praxis      Cognition Arousal/Alertness: Awake/alert Behavior During Therapy: Flat affect Overall Cognitive Status: Impaired/Different from baseline Area of Impairment: Attention, Following commands, Problem solving                   Current Attention Level: Sustained   Following Commands: Follows one step commands with  increased time, Follows one step commands inconsistently Safety/Judgement: Decreased awareness of safety, Decreased awareness of deficits     General Comments: needs multiple cues and encouragement to follow any commands, wife present and assisting also        Exercises      Shoulder Instructions       General Comments patient used yonker with cues and initial hand over hand assist, attempted to get him to wash his face and he refused to assisted; wife arrived during session and assisted to encourage participation    Pertinent Vitals/ Pain       Pain Assessment Pain Assessment: Faces Faces Pain Scale: Hurts even more Pain Descriptors / Indicators: Discomfort, Grimacing Pain Intervention(s): Limited activity within patient's tolerance, Monitored during session, Premedicated before session, Repositioned  Home Living                                          Prior Functioning/Environment              Frequency  Min 1X/week        Progress Toward Goals  OT Goals(current goals can now be found in the care plan section)  Progress towards OT goals: Goals drowngraded-see care plan  Acute Rehab OT Goals Patient Stated Goal: unable to state OT Goal Formulation: With patient Time For Goal Achievement: 03/17/23 Potential to Achieve Goals: Fair ADL Goals Pt Will Perform Eating: sitting;with supervision;with set-up Pt Will Perform Upper Body Bathing: with mod assist;sitting Pt/caregiver will Perform Home Exercise Program: Increased strength;Both right and left upper extremity;With written HEP provided;With Supervision;With theraband Additional ADL Goal #1: Patient will be able to tolerate sitting EOB for 5 minutes (up to min A external support) as a precursor to OOB activites.  Plan Discharge plan needs to be updated    Co-evaluation      Reason for Co-Treatment: Complexity of the patient's impairments (multi-system involvement);For patient/therapist  safety;To address functional/ADL transfers PT goals addressed during session: Mobility/safety with mobility;Strengthening/ROM OT goals addressed during session: ADL's and self-care;Strengthening/ROM      AM-PAC OT "6 Clicks" Daily Activity     Outcome Measure   Help from another person eating meals?: Total (NPO) Help from another person taking care of personal grooming?: Total Help from another person toileting, which includes using toliet, bedpan, or urinal?: Total Help from another person bathing (including washing, rinsing, drying)?: Total Help from another person to put on and taking off regular upper body clothing?: Total Help from another person to put on and taking off regular lower body clothing?: Total 6 Click Score: 6    End of Session    OT Visit Diagnosis: Unsteadiness on feet (R26.81);Other abnormalities of gait and mobility (R26.89);Muscle weakness (generalized) (M62.81);Other symptoms and signs involving cognitive function;Pain   Activity Tolerance Patient limited by fatigue;Patient limited by lethargy;Patient limited by pain   Patient Left in bed;with call bell/phone within reach;with nursing/sitter in room;with family/visitor present;with bed alarm set   Nurse  Communication Mobility status        Time: 1002-1026 OT Time Calculation (min): 24 min  Charges: OT General Charges $OT Visit: 1 Visit OT Treatments $Self Care/Home Management : 8-22 mins  Pollyann Glen E. Eleonora Peeler, OTR/L Acute Rehabilitation Services [phone]   Cherlyn Cushing 03/03/2023, 3:48 PM

## 2023-03-03 NOTE — Progress Notes (Signed)
Physical Therapy Treatment Patient Details Name: Bradley Dominguez MRN: [id number] DOB: 1955/11/09 Today's Date: 03/03/2023   History of Present Illness 67 yo M HF, chronic partial small bowel obstruction and multiple previous abd surgeries  who was admitted  4/8 from home w n/v/bloating. Underwent EGD 4/11 -- concerning for duodenal obstruction. Developed hemorrhagic shock 4/15 in the setting of hematemesis / hematochezia, decompensated requiring, intubation, embolization of jejunal arcade branch. Extubated 01/22/23. Underwent ex lap adhesiolysis, duodenojejunal bypass, duodenostomy tube placement, T tube placement in common bile duct 4/22, transferred to [facility] 4/23 with hemorrhagic shock, return to OR 4/23, 4/24, 4/25 and finally 4/27 with placement of J tube, end ileostomy, abdominal closure.  Extubated 4/30.  NGT placed 5/14 due to possible ileus.    PT Comments    Patient continues with limited activity tolerance and profound weakness.  He needed max encouragement and rolled to place new bed pad and assisted to sit into long sitting briefly to remove old bed pad.  Patient gets pain meds and muscle relaxors per his wife then he just wants to rest.  Not a consistent schedule, but may benefit if able to plan therapies when he has not had his meds.  Patient remains appropriate for LTACH when stable for d/c.   Recommendations for follow up therapy are one component of a multi-disciplinary discharge planning process, led by the attending physician.  Recommendations may be updated based on patient status, additional functional criteria and insurance authorization.  Follow Up Recommendations       Assistance Recommended at Discharge Frequent or constant Supervision/Assistance  Patient can return home with the following Two people to help with walking and/or transfers;Assistance with cooking/housework;Direct supervision/assist for medications management;Assist for transportation;Help with stairs or ramp for  entrance;Two people to help with bathing/dressing/bathroom   Equipment Recommendations  Other (comment) (TBA)    Recommendations for Other Services       Precautions / Restrictions Precautions Precautions: Fall     Mobility  Bed Mobility Overal bed mobility: Needs Assistance Bed Mobility: Rolling, Supine to Sit Rolling: Max assist, +2 for physical assistance   Supine to sit: Max assist, +2 for physical assistance     General bed mobility comments: refusing much of anything due to wanting to rest with pain meds (per wife); rolled to placed new bed pad with increased time and cues; pulled up to sit with max A of 2 in long sitting to remove bed pad    Transfers                   General transfer comment: NT    Ambulation/Gait                   Stairs             Wheelchair Mobility    Modified Rankin (Stroke Patients Only)       Balance                                            Cognition Arousal/Alertness: Awake/alert Behavior During Therapy: Flat affect Overall Cognitive Status: Impaired/Different from baseline Area of Impairment: Attention, Following commands, Problem solving                   Current Attention Level: Sustained   Following Commands: Follows one step commands with increased time, Follows one step commands inconsistently  Safety/Judgement: Decreased awareness of safety, Decreased awareness of deficits     General Comments: needs multiple cues and encouragement to follow any commands, wife present and assisting also        Exercises      General Comments General comments (skin integrity, edema, etc.): patient used yonker with cues and initial hand over hand assist, attempted to get him to wash his face and he refused to assisted; wife arrived during session and assisted to encourage participation      Pertinent Vitals/Pain Pain Assessment Pain Assessment: Faces Faces Pain Scale: Hurts  even more Pain Location: generalized with mobility Pain Descriptors / Indicators: Discomfort, Grimacing Pain Intervention(s): Monitored during session, Limited activity within patient's tolerance, Repositioned    Home Living                          Prior Function            PT Goals (current goals can now be found in the care plan section) Progress towards PT goals: Not progressing toward goals - comment    Frequency    Min 3X/week      PT Plan Current plan remains appropriate    Co-evaluation PT/OT/SLP Co-Evaluation/Treatment: Yes Reason for Co-Treatment: Complexity of the patient's impairments (multi-system involvement);For patient/therapist safety;To address functional/ADL transfers PT goals addressed during session: Mobility/safety with mobility;Strengthening/ROM        AM-PAC PT "6 Clicks" Mobility   Outcome Measure  Help needed turning from your back to your side while in a flat bed without using bedrails?: Total Help needed moving from lying on your back to sitting on the side of a flat bed without using bedrails?: Total Help needed moving to and from a bed to a chair (including a wheelchair)?: Total Help needed standing up from a chair using your arms (e.g., wheelchair or bedside chair)?: Total Help needed to walk in hospital room?: Total Help needed climbing 3-5 steps with a railing? : Total 6 Click Score: 6    End of Session   Activity Tolerance: Patient limited by fatigue Patient left: in bed;with call bell/phone within reach   PT Visit Diagnosis: Other abnormalities of gait and mobility (R26.89);Muscle weakness (generalized) (M62.81);Other symptoms and signs involving the nervous system (R29.898)     Time: 7829-5621 PT Time Calculation (min) (ACUTE ONLY): 24 min  Charges:  $Therapeutic Activity: 8-22 mins                     Sheran Lawless, PT Acute Rehabilitation Services Office:[phone] 03/03/2023    [name] 03/03/2023,  1:58 PM

## 2023-03-03 NOTE — Progress Notes (Signed)
Per Janee Morn MD, given permission to titrate Levo gtt above ceiling (10 mcg). Currently running at 11 mcg. Albumin currently infusing. BP 93/66, HR 97. Will continue to monitor vitals.   Lady Deutscher, RN

## 2023-03-03 NOTE — Progress Notes (Signed)
[name]  Assessment/ Plan: Pt is a 67 y.o. yo male  who was admitted with SBO underwent multiple abdominal surgeries, on TPN, GI bleed with massive blood product transfusion, hypotension/shock, significant hyperbilirubinemia, now with azotemia.   # Acute kidney injury with azotemia with prolonged hospitalization, multiple abdominal surgeries, hemodynamic changes with reduced renal perfusion. High BUN is due to hypercatabolic state concomitant with multiple blood product transfusion, GI bleed, TPN etc. Both BUN and creatinine level is trending down nicely with IV fluids.  Urine output has increased. Will give a little more free water given sodium -  aiming for 140  # Metabolic acidosis improved with IV sodium bicarb.  Fluid switched to NS. Low threshold to change back to bicarb  # SBO status post multiple laparotomies, ileostomy placement: Currently on TPN and managed by general surgery team.  # GI bleed: Monitor CBC and transfuse as needed.  # Severe protein calorie malnutrition: TPN, albumin.  # Hypokalemia: Repleted potassium- being managed by primary and pharmacy  BUN has actually improved quite a bit the last few days-  renal will sign off-  call with questions   Subjective:  The patient's brother was present at the bedside.  The urine output is 2800   Objective Vital signs in last 24 hours: Vitals:   03/03/23 0700 03/03/23 0715 03/03/23 0730 03/03/23 0800  BP: 102/62 102/60 95/62   Pulse: 97 99 98   Resp: (!) 23 (!) 22 19   Temp:    98.2 F (36.8 C)  TempSrc:    Oral  SpO2: 98% 100% 99%   Weight:      Height:       Weight change:   Intake/Output Summary (Last 24 hours) at 03/03/2023 0903 Last data filed at 03/03/2023 0700 Gross per 24 hour  Intake 5040.47 ml  Output 5000 ml  Net 40.47 ml       Labs: RENAL PANEL Recent Labs  Lab 02/25/23 0421 02/26/23 4098 02/27/23 0650 02/28/23 0620 03/01/23 0629 03/02/23 0410  03/03/23 0438  NA 144 144 146* 144 145 141 144  K 4.4 4.4 4.5 4.4 4.1 3.4* 3.7  CL 118* 120* 118* 112* 107 107 114*  CO2 17* 16* 19* 22 26 22  20*  GLUCOSE 128* 121* 134* 138* 127* 117* 102*  BUN 96* 105* 111* 111* 97* 77* 64*  CREATININE 1.34* 1.32* 1.27* 1.38* 1.42* 1.29* 1.20  CALCIUM 8.8* 8.7* 8.2* 8.3* 8.2* 7.8* 7.9*  MG 1.8 2.4 2.2  --  2.1  --  2.0  PHOS 4.4 5.0* 4.8*  --   --  4.6 4.8*  ALBUMIN  --   --  1.7*  --   --   --  1.9*    Liver Function Tests: Recent Labs  Lab 02/27/23 0650 03/03/23 0438  AST 257* 246*  ALT 228* 247*  ALKPHOS 150* 173*  BILITOT 24.1* 28.4*  PROT 5.7* 6.1*  ALBUMIN 1.7* 1.9*   No results for input(s): "LIPASE", "AMYLASE" in the last 168 hours. No results for input(s): "AMMONIA" in the last 168 hours. CBC: Recent Labs    01/21/23 0240 01/21/23 0630 03/01/23 0629 03/01/23 1419 03/01/23 2158 03/02/23 0410 03/03/23 0438  HGB  --    < > 7.9* 7.1* 8.6* 8.6* 9.1*  MCV  --    < > 86.0 89.3  --  85.8 89.3  RETICCTPCT 1.2  --   --   --   --   --   --    < > =  values in this interval not displayed.    Cardiac Enzymes: No results for input(s): "CKTOTAL", "CKMB", "CKMBINDEX", "TROPONINI" in the last 168 hours. CBG: Recent Labs  Lab 03/02/23 1517 03/02/23 1947 03/02/23 2328 03/03/23 0329 03/03/23 0728  GLUCAP 115* 101* 115* 113* 106*    Iron Studies: No results for input(s): "IRON", "TIBC", "TRANSFERRIN", "FERRITIN" in the last 72 hours. Studies/Results: No results found.  Medications: Infusions:  sodium chloride 10 mL/hr at 03/02/23 2331   sodium chloride 75 mL/hr at 03/03/23 0700   methocarbamol (ROBAXIN) IV Stopped (03/03/23 1610)   norepinephrine (LEVOPHED) Adult infusion 8 mcg/min (03/03/23 0841)   TPN ADULT (ION) 95 mL/hr at 03/03/23 0700   TPN ADULT (ION)      Scheduled Medications:  sodium chloride   Intravenous Once   Chlorhexidine Gluconate Cloth  6 each Topical Daily   feeding supplement (PIVOT 1.5 CAL)  1,000 mL  Per Tube Q24H   free water  30 mL Per Tube Q12H   mouth rinse  15 mL Mouth Rinse 4 times per day   pantoprazole  40 mg Intravenous Q12H   potassium chloride  60 mEq Per Tube Once   sodium chloride flush  10-40 mL Intracatheter Q12H    have reviewed scheduled and prn medications.  Physical Exam: General: Critically ill looking cachectic, frail male, icterus++ Heart:RRR, s1s2 nl Lungs:clear b/l, no crackle Abdomen: Nondistended, ostomy present Extremities:No edema Neurology: Opens eyes with the name  Bradley Dominguez 03/03/2023,9:03 AM  LOS: 49 days

## 2023-03-03 NOTE — Plan of Care (Signed)
  Problem: Education: Goal: Knowledge of General Education information will improve Description: Including pain rating scale, medication(s)/side effects and non-pharmacologic comfort measures Outcome: Progressing   Problem: Clinical Measurements: Goal: Ability to maintain clinical measurements within normal limits will improve Outcome: Progressing Goal: Will remain free from infection Outcome: Progressing Goal: Diagnostic test results will improve Outcome: Progressing Goal: Respiratory complications will improve Outcome: Progressing Goal: Cardiovascular complication will be avoided Outcome: Progressing   Problem: Activity: Goal: Risk for activity intolerance will decrease Outcome: Progressing   Problem: Nutrition: Goal: Adequate nutrition will be maintained Outcome: Progressing   Problem: Elimination: Goal: Will not experience complications related to bowel motility Outcome: Progressing Goal: Will not experience complications related to urinary retention Outcome: Progressing   Problem: Pain Managment: Goal: General experience of comfort will improve Outcome: Progressing   Problem: Safety: Goal: Ability to remain free from injury will improve Outcome: Progressing   Problem: Skin Integrity: Goal: Risk for impaired skin integrity will decrease Outcome: Progressing   Problem: Fluid Volume: Goal: Ability to maintain a balanced intake and output will improve Outcome: Progressing   Problem: Metabolic: Goal: Ability to maintain appropriate glucose levels will improve Outcome: Progressing   Problem: Nutritional: Goal: Maintenance of adequate nutrition will improve Outcome: Progressing Goal: Progress toward achieving an optimal weight will improve Outcome: Progressing   Problem: Skin Integrity: Goal: Risk for impaired skin integrity will decrease Outcome: Progressing   Problem: Tissue Perfusion: Goal: Adequacy of tissue perfusion will improve Outcome:  Progressing   Problem: Education: Goal: Understanding of CV disease, CV risk reduction, and recovery process will improve Outcome: Progressing Goal: Individualized Educational Video(s) Outcome: Progressing   Problem: Activity: Goal: Ability to return to baseline activity level will improve Outcome: Progressing   Problem: Cardiovascular: Goal: Ability to achieve and maintain adequate cardiovascular perfusion will improve Outcome: Progressing Goal: Vascular access site(s) Level 0-1 will be maintained Outcome: Progressing   Problem: Cardiac: Goal: Ability to maintain an adequate cardiac output will improve Outcome: Progressing   Problem: Fluid Volume: Goal: Ability to achieve a balanced intake and output will improve Outcome: Progressing   Problem: Metabolic: Goal: Ability to maintain appropriate glucose levels will improve Outcome: Progressing   Problem: Nutritional: Goal: Maintenance of adequate nutrition will improve Outcome: Progressing Goal: Maintenance of adequate weight for body size and type will improve Outcome: Progressing   Problem: Health Behavior/Discharge Planning: Goal: Ability to manage health-related needs will improve Outcome: Not Progressing   Problem: Coping: Goal: Level of anxiety will decrease Outcome: Not Progressing   Problem: Education: Goal: Ability to describe self-care measures that may prevent or decrease complications (Diabetes Survival Skills Education) will improve Outcome: Not Progressing Goal: Individualized Educational Video(s) Outcome: Not Progressing   Problem: Coping: Goal: Ability to adjust to condition or change in health will improve Outcome: Not Progressing   Problem: Health Behavior/Discharge Planning: Goal: Ability to identify and utilize available resources and services will improve Outcome: Not Progressing Goal: Ability to manage health-related needs will improve Outcome: Not Progressing   Problem: Health  Behavior/Discharge Planning: Goal: Ability to safely manage health-related needs after discharge will improve Outcome: Not Progressing   Problem: Health Behavior/Discharge Planning: Goal: Ability to identify and utilize available resources and services will improve Outcome: Not Progressing Goal: Ability to manage health-related needs will improve Outcome: Not Progressing

## 2023-03-03 NOTE — Plan of Care (Signed)
  Problem: Clinical Measurements: Goal: Respiratory complications will improve Outcome: Progressing   Problem: Nutrition: Goal: Adequate nutrition will be maintained Outcome: Progressing   Problem: Coping: Goal: Level of anxiety will decrease Outcome: Progressing   Problem: Elimination: Goal: Will not experience complications related to bowel motility Outcome: Progressing Goal: Will not experience complications related to urinary retention Outcome: Progressing   Problem: Pain Managment: Goal: General experience of comfort will improve Outcome: Progressing   Problem: Safety: Goal: Ability to remain free from injury will improve Outcome: Progressing   Problem: Skin Integrity: Goal: Risk for impaired skin integrity will decrease Outcome: Progressing   Problem: Fluid Volume: Goal: Ability to maintain a balanced intake and output will improve Outcome: Progressing   Problem: Metabolic: Goal: Ability to maintain appropriate glucose levels will improve Outcome: Progressing   Problem: Nutritional: Goal: Maintenance of adequate nutrition will improve Outcome: Progressing   Problem: Skin Integrity: Goal: Risk for impaired skin integrity will decrease Outcome: Progressing   Problem: Tissue Perfusion: Goal: Adequacy of tissue perfusion will improve Outcome: Progressing   Problem: Education: Goal: Understanding of CV disease, CV risk reduction, and recovery process will improve Outcome: Progressing Goal: Individualized Educational Video(s) Outcome: Progressing   Problem: Cardiovascular: Goal: Vascular access site(s) Level 0-1 will be maintained Outcome: Progressing   Problem: Health Behavior/Discharge Planning: Goal: Ability to safely manage health-related needs after discharge will improve Outcome: Progressing   Problem: Metabolic: Goal: Ability to maintain appropriate glucose levels will improve Outcome: Progressing   Problem: Nutritional: Goal: Maintenance of  adequate nutrition will improve Outcome: Progressing   Problem: Education: Goal: Knowledge of General Education information will improve Description: Including pain rating scale, medication(s)/side effects and non-pharmacologic comfort measures Outcome: Not Progressing   Problem: Health Behavior/Discharge Planning: Goal: Ability to manage health-related needs will improve Outcome: Not Progressing   Problem: Clinical Measurements: Goal: Ability to maintain clinical measurements within normal limits will improve Outcome: Not Progressing Goal: Will remain free from infection Outcome: Not Progressing Goal: Diagnostic test results will improve Outcome: Not Progressing Goal: Cardiovascular complication will be avoided Outcome: Not Progressing   Problem: Activity: Goal: Risk for activity intolerance will decrease Outcome: Not Progressing   Problem: Education: Goal: Ability to describe self-care measures that may prevent or decrease complications (Diabetes Survival Skills Education) will improve Outcome: Not Progressing Goal: Individualized Educational Video(s) Outcome: Not Progressing   Problem: Coping: Goal: Ability to adjust to condition or change in health will improve Outcome: Not Progressing   Problem: Health Behavior/Discharge Planning: Goal: Ability to identify and utilize available resources and services will improve Outcome: Not Progressing Goal: Ability to manage health-related needs will improve Outcome: Not Progressing   Problem: Nutritional: Goal: Progress toward achieving an optimal weight will improve Outcome: Not Progressing   Problem: Activity: Goal: Ability to return to baseline activity level will improve Outcome: Not Progressing   Problem: Cardiovascular: Goal: Ability to achieve and maintain adequate cardiovascular perfusion will improve Outcome: Not Progressing   Problem: Cardiac: Goal: Ability to maintain an adequate cardiac output will  improve Outcome: Not Progressing   Problem: Health Behavior/Discharge Planning: Goal: Ability to identify and utilize available resources and services will improve Outcome: Not Progressing Goal: Ability to manage health-related needs will improve Outcome: Not Progressing   Problem: Fluid Volume: Goal: Ability to achieve a balanced intake and output will improve Outcome: Not Progressing   Problem: Nutritional: Goal: Maintenance of adequate weight for body size and type will improve Outcome: Not Progressing

## 2023-03-03 NOTE — Consult Note (Signed)
WOC RN in to provide ostomy support. Wife not present in room at this time.  Patients pouch intact, patient states was changed by wife "yesterday".  2 3/4" supplies in room:  4 sets of each 2 3/4" skin barrier [place] #2), 2 3/4" ostomy pouch [place] [phone]) and 2" skin barrier ring [place] [phone]).   Wife is independent in care of ostomy.  Patient remains in ICU setting.  WOC team will remain available for ostomy education and support.    Thank you,    Priscella Mann MSN, RN-BC, [employer] [phone]

## 2023-03-03 NOTE — Progress Notes (Signed)
Patient ID: Bradley Dominguez, male   DOB: 1956/05/25, 67 y.o.   MRN: [id number] Follow up - Trauma Critical Care   Patient Details:    Bradley Dominguez is an 67 y.o. male.  Lines/tubes : PICC Triple Lumen 01/23/23 Right Basilic 39 cm 0 cm (Active)  Indication for Insertion or Continuance of Line Vasoactive infusions 03/02/23 2000  Exposed Catheter (cm) 0 cm 02/25/23 2000  Site Assessment Clean, Dry, Intact 03/02/23 2000  Lumen #1 Status In-line blood sampling system in place;Flushed 03/02/23 2000  Lumen #2 Status Infusing 03/02/23 2000  Lumen #3 Status Other (Comment);Infusing 03/02/23 2000  Dressing Type Transparent 03/02/23 2000  Dressing Status Antimicrobial disc in place;Clean, Dry, Intact 03/02/23 2000  Safety Lock Not Applicable 03/02/23 2000  Line Care Connections checked and tightened 03/02/23 2000  Line Adjustment (NICU/IV Team Only) No 03/01/23 1817  Dressing Intervention Dressing changed 02/25/23 2000  Dressing Change Due 03/04/23 03/02/23 2000     Closed System Drain 1 Left Abdomen Bulb (JP) 19 Fr. (Active)  Site Description Unremarkable 03/02/23 2000  Dressing Status Clean, Dry, Intact 03/02/23 2000  Drainage Appearance Brown 03/02/23 2000  Status To suction (Charged) 03/02/23 2000  Intake (mL) 0 ml 02/19/23 1200  Output (mL) 0 mL 03/03/23 0559     Biliary Tube T-tube 10 Fr. RUQ (Active)  Site Description Unremarkable 03/02/23 2000  Dressing Status Clean, Dry, Intact 03/02/23 2000  Drainage Color Yellow 03/02/23 2000  Tube Status/Interventions Open to gravity drainage 03/02/23 2000  Intake (mL) 0 ml 02/21/23 0800  Output (mL) 0 mL 03/03/23 0559     NG/OG Vented/Dual Lumen 16 Fr. Right nare Marking at nare/corner of mouth 65 cm (Active)  Tube Position (Required) External length of tube 03/02/23 2000  Measurement (cm) (Required) 62 cm 03/02/23 2000  Ongoing Placement Verification (Required) (See row information) Yes 03/02/23 2000  Site Assessment Clean, Dry, Intact  03/02/23 2000  Interventions Cleansed 03/02/23 0800  Status Low intermittent suction 03/02/23 2000  Amount of suction 80 mmHg 02/27/23 0800  Drainage Appearance Dark red;Brown 03/02/23 2000  Output (mL) 500 mL 03/03/23 0559     Gastrostomy/Enterostomy Other (Comment) 24 Fr. RUQ (Active)  Surrounding Skin Dry;Intact 03/02/23 2000  Tube Status/Interventions Open to gravity drainage 03/02/23 2000  Drainage Appearance Brown 03/02/23 2000  Dressing Status Clean, Dry, Intact 03/02/23 2000  Dressing Intervention Dressing changed 03/02/23 2000  Dressing Type Split gauze 03/02/23 2000  J Port Intake (mL) 10 ml 02/18/23 1800  Output (mL) 25 mL 03/03/23 0559     Gastrostomy/Enterostomy Jejunostomy 16 Fr. RLQ (Active)  Surrounding Skin Dry;Intact 03/02/23 2000  Tube Status/Interventions Feeding 03/02/23 2000  Drainage Appearance None 03/02/23 2000  Dressing Status Clean, Dry, Intact 03/02/23 2000  Dressing Intervention Dressing reinforced 03/02/23 2000  Dressing Type Split gauze 03/02/23 2000  G Port Intake (mL) 30 ml 02/26/23 1000  J Port Intake (mL) 30 ml 02/27/23 1000  Output (mL) 50 mL 03/03/23 0559     Ileostomy Standard (end) LLQ (Active)  Ostomy Pouch 2 piece;Intact 03/02/23 2000  Stoma Assessment Pink 03/02/23 2000  Peristomal Assessment Intact 03/02/23 2000  Treatment Other (Comment) 02/20/23 0800  Output (mL) 30 mL 03/02/23 0630     Urethral Catheter Sarah Hocutt RN Straight-tip 16 Fr. (Active)  Indication for Insertion or Continuance of Catheter Therapy based on hourly urine output monitoring and documentation for critical condition (NOT STRICT I&O) 03/02/23 2000  Site Assessment Clean, Dry, Intact 03/02/23 2000  Catheter Maintenance Bag below level of  bladder;Catheter secured;Drainage bag/tubing not touching floor;Insertion date on drainage bag;No dependent loops;Seal intact 03/02/23 2000  Collection Container Standard drainage bag 03/02/23 2000  Securement Method Securing  device (Describe) 03/02/23 2000  Urinary Catheter Interventions (if applicable) Unclamped 03/02/23 2000  Output (mL) 500 mL 03/03/23 0559    Microbiology/Sepsis markers: Results for orders placed or performed during the hospital encounter of 01/13/23  MRSA Next Gen by PCR, Nasal     Status: None   Collection Time: 01/28/23 12:12 PM   Specimen: Nasal Mucosa; Nasal Swab  Result Value Ref Range Status   MRSA by PCR Next Gen NOT DETECTED NOT DETECTED Final    Comment: (NOTE) The GeneXpert MRSA Assay (FDA approved for NASAL specimens only), is one component of a comprehensive MRSA colonization surveillance program. It is not intended to diagnose MRSA infection nor to guide or monitor treatment for MRSA infections. Test performance is not FDA approved in patients less than 47 years old. Performed at [facility], [address], [place], [place] [address]   Urine Culture (for pregnant, neutropenic or urologic patients or patients with an indwelling urinary catheter)     Status: None   Collection Time: 01/29/23  2:03 PM   Specimen: Urine, Catheterized  Result Value Ref Range Status   Specimen Description URINE, CATHETERIZED  Final   Special Requests NONE  Final   Culture   Final    NO GROWTH Performed at [facility], [address], [place], [place] [address]    Report Status 01/30/2023 FINAL  Final  Surgical PCR screen     Status: Abnormal   Collection Time: 02/01/23  5:13 AM   Specimen: Nasal Mucosa; Nasal Swab  Result Value Ref Range Status   MRSA, PCR NEGATIVE NEGATIVE Final   Staphylococcus aureus POSITIVE (A) NEGATIVE Final    Comment: (NOTE) The Xpert SA Assay (FDA approved for NASAL specimens in patients 85 years of age and older), is one component of a comprehensive surveillance program. It is not intended to diagnose infection nor to guide or monitor treatment. Performed at [facility], [address] [place], [place] [address]   Culture,  Respiratory w Gram Stain     Status: None   Collection Time: 02/03/23 12:21 PM   Specimen: Tracheal Aspirate; Respiratory  Result Value Ref Range Status   Specimen Description TRACHEAL ASPIRATE  Final   Special Requests NONE  Final   Gram Stain   Final    FEW WBC PRESENT, PREDOMINANTLY PMN MODERATE GRAM NEGATIVE RODS    Culture ABUNDANT PSEUDOMONAS AERUGINOSA  Final   Report Status 02/05/2023 FINAL  Final   Organism ID, Bacteria PSEUDOMONAS AERUGINOSA  Final      Susceptibility   Pseudomonas aeruginosa - MIC*    CEFTAZIDIME 8 SENSITIVE Sensitive     CIPROFLOXACIN <=0.25 SENSITIVE Sensitive     GENTAMICIN <=1 SENSITIVE Sensitive     IMIPENEM 1 SENSITIVE Sensitive     PIP/TAZO >=128 RESISTANT Resistant     CEFEPIME Value in next row Resistant      RESISTANT16Performed at [facility], [address], [place], [place] [address]    * ABUNDANT PSEUDOMONAS AERUGINOSA    Anti-infectives:  Anti-infectives (From admission, onward)    Start     Dose/Rate Route Frequency Ordered Stop   02/05/23 1345  ciprofloxacin (CIPRO) IVPB 400 mg        400 mg 200 mL/hr over 60 Minutes Intravenous Every 12 hours 02/05/23  1259 02/11/23 2217   01/28/23 1000  piperacillin-tazobactam (ZOSYN) IVPB 3.375 g  Status:  Discontinued        3.375 g 12.5 mL/hr over 240 Minutes Intravenous Every 8 hours 01/28/23 0902 02/05/23 1259   01/27/23 1030  ceFAZolin (ANCEF) IVPB 2g/100 mL premix       See Hyperspace for full Linked Orders Report.   2 g 200 mL/hr over 30 Minutes Intravenous On call to O.R. 01/27/23 0746 01/27/23 1135   01/27/23 1030  metroNIDAZOLE (FLAGYL) IVPB 500 mg       See Hyperspace for full Linked Orders Report.   500 mg 100 mL/hr over 60 Minutes Intravenous On call to O.R. 01/27/23 0746 01/27/23 1141   01/19/23 0600  cefTRIAXone (ROCEPHIN) 2 g in sodium chloride 0.9 % 100 mL IVPB       See Hyperspace for full Linked Orders Report.   2 g 200 mL/hr over 30 Minutes Intravenous On call to  O.R. 01/19/23 0350 01/19/23 0606   01/19/23 0350  metroNIDAZOLE (FLAGYL) IVPB 500 mg  Status:  Discontinued       See Hyperspace for full Linked Orders Report.   500 mg 100 mL/hr over 60 Minutes Intravenous Every 8 hours 01/19/23 0350 01/19/23 0752     Events:  Subjective:    Overnight Issues:   Objective:  Vital signs for last 24 hours: Temp:  [97.6 F (36.4 C)-98.5 F (36.9 C)] 98.5 F (36.9 C) (05/27 0400) Pulse Rate:  [87-108] 98 (05/27 0730) Resp:  [14-31] 19 (05/27 0730) BP: (86-127)/(48-77) 95/62 (05/27 0730) SpO2:  [94 %-100 %] 99 % (05/27 0730)  Hemodynamic parameters for last 24 hours:    Intake/Output from previous day: 05/26 0701 - 05/27 0700 In: 5524.5 [I.V.:4669.1; NG/GT:700.3; IV Piggyback:155.1] Out: 5575 [Urine:2820; Emesis/NG output:1950; Drains:805]  Intake/Output this shift: No intake/output data recorded.  Vent settings for last 24 hours:    Physical Exam:  General: alert and no respiratory distress Neuro: alert HEENT/Neck: no JVD Resp: clear to auscultation bilaterally CVS: rrr GI: stable tubes and drain, wound stable, ileostomy with good output Extremities: no edema  Results for orders placed or performed during the hospital encounter of 01/13/23 (from the past 24 hour(s))  Glucose, capillary     Status: Abnormal   Collection Time: 03/02/23 11:45 AM  Result Value Ref Range   Glucose-Capillary 114 (H) 70 - 99 mg/dL  Glucose, capillary     Status: Abnormal   Collection Time: 03/02/23  3:17 PM  Result Value Ref Range   Glucose-Capillary 115 (H) 70 - 99 mg/dL  Glucose, capillary     Status: Abnormal   Collection Time: 03/02/23  7:47 PM  Result Value Ref Range   Glucose-Capillary 101 (H) 70 - 99 mg/dL  Glucose, capillary     Status: Abnormal   Collection Time: 03/02/23 11:28 PM  Result Value Ref Range   Glucose-Capillary 115 (H) 70 - 99 mg/dL  Glucose, capillary     Status: Abnormal   Collection Time: 03/03/23  3:29 AM  Result Value  Ref Range   Glucose-Capillary 113 (H) 70 - 99 mg/dL  Comprehensive metabolic panel     Status: Abnormal   Collection Time: 03/03/23  4:38 AM  Result Value Ref Range   Sodium 144 135 - 145 mmol/L   Potassium 3.7 3.5 - 5.1 mmol/L   Chloride 114 (H) 98 - 111 mmol/L   CO2 20 (L) 22 - 32 mmol/L   Glucose, Bld 102 (H) 70 -  99 mg/dL   BUN 64 (H) 8 - 23 mg/dL   Creatinine, Ser 1.61 0.61 - 1.24 mg/dL   Calcium 7.9 (L) 8.9 - 10.3 mg/dL   Total Protein 6.1 (L) 6.5 - 8.1 g/dL   Albumin 1.9 (L) 3.5 - 5.0 g/dL   AST 096 (H) 15 - 41 U/L   ALT 247 (H) 0 - 44 U/L   Alkaline Phosphatase 173 (H) 38 - 126 U/L   Total Bilirubin 28.4 (HH) 0.3 - 1.2 mg/dL   GFR, Estimated >04 >54 mL/min   Anion gap 10 5 - 15  Magnesium     Status: None   Collection Time: 03/03/23  4:38 AM  Result Value Ref Range   Magnesium 2.0 1.7 - 2.4 mg/dL  Phosphorus     Status: Abnormal   Collection Time: 03/03/23  4:38 AM  Result Value Ref Range   Phosphorus 4.8 (H) 2.5 - 4.6 mg/dL  Triglycerides     Status: None   Collection Time: 03/03/23  4:38 AM  Result Value Ref Range   Triglycerides 113 <150 mg/dL  CBC     Status: Abnormal   Collection Time: 03/03/23  4:38 AM  Result Value Ref Range   WBC 15.2 (H) 4.0 - 10.5 K/uL   RBC 3.09 (L) 4.22 - 5.81 MIL/uL   Hemoglobin 9.1 (L) 13.0 - 17.0 g/dL   HCT 09.8 (L) 11.9 - 14.7 %   MCV 89.3 80.0 - 100.0 fL   MCH 29.4 26.0 - 34.0 pg   MCHC 33.0 30.0 - 36.0 g/dL   RDW 82.9 (H) 56.2 - 13.0 %   Platelets 198 150 - 400 K/uL   nRBC 0.0 0.0 - 0.2 %  Glucose, capillary     Status: Abnormal   Collection Time: 03/03/23  7:28 AM  Result Value Ref Range   Glucose-Capillary 106 (H) 70 - 99 mg/dL    Assessment & Plan: Present on Admission:  Bowel obstruction (HCC)  Protein-calorie malnutrition, severe (HCC)  Duodenal obstruction    LOS: 49 days   Additional comments:I reviewed the patient's new clinical lab test results. /  67 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   4/22 - Ex lap,  adhesiolysis, duodenojejunal bypass, duodenostomy tube placement, T tube placement in common bile duct. 4/23 - Takeback for bleeding with hemorrhagic shock, resection of ischemic transverse colon, left in discontinuity with Abthera 4/24 - Takeback for bleeding, abdominal washout, ileocecectomy, pack placement, placement of Abthera 4/25 - Takeback, resection of 10cm necrotic distal ileum, washout, removal of packs, placement of Abthera 4/27 - Takeback, placement of J tube, end ileostomy, abdominal closure   - Hyperbilirubinemia: Tube cholangiogram on 5/3 showed patency of the biliary tree with no obstruction. Likely multifactorial due to massive transfusion with hemolysis and shock liver. Bilirubin is very slowly downtrending. Synthetic function is normal. Continue trending LFTs twice weekly. - Keep T tube and D tube to gravity drainage. 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 - Pain: prn dilaudid, decreased dose - Wound care: Saline wet-to-dry to midline wound - ID: WBC down some, no intraabdominal collections on CT scan. Concern for pneumonia RLL, patient was previously treated for pneumonia, currently no clinical signs of respiratory distress, productive cough or fevers. Hold on abx at this time. - FEN: NPO, ok for ice chips.  Continue full strength TPN and NG decompression. Continue trophic feeds, will plan to advance very slowly to 30/h  today. BUN down to 97. Protein load in TPN decreased. Improving -  AKI: Improving. (CRT 1.2, BUN 64) - Urinary retention: Foley replaced on 5/20 for second time for urinary retention. Keep in place. - WOC following for ostomy care and teaching - PT/OT following. Patient is profoundly deconditioned. - GI bleeding: Dark blood no longer in NGT but present in ileostomy output. Hgb 9.1 today, 1 unit given 5/25 with appropriate response.  - VTE: SCDs, lovenox on hold given recent GI bleeding.  - Dispo: ICU  I spoke with his wife and reviewed improved  labs including Hb and BUN, CRT  Violeta Gelinas, MD, MPH, FACS Trauma & General Surgery Use [email or web address] to contact on call provider  03/03/2023  *Care during the described time interval was provided by me. I have reviewed this patient's available data, including medical history, events of note, physical examination and test results as part of my evaluation.

## 2023-03-04 LAB — CBC
HCT: 27.3 % — ABNORMAL LOW (ref 39.0–52.0)
Hemoglobin: 8.9 g/dL — ABNORMAL LOW (ref 13.0–17.0)
MCH: 30.1 pg (ref 26.0–34.0)
MCHC: 32.6 g/dL (ref 30.0–36.0)
MCV: 92.2 fL (ref 80.0–100.0)
Platelets: 197 10*3/uL (ref 150–400)
RBC: 2.96 MIL/uL — ABNORMAL LOW (ref 4.22–5.81)
RDW: 26.9 % — ABNORMAL HIGH (ref 11.5–15.5)
WBC: 15 10*3/uL — ABNORMAL HIGH (ref 4.0–10.5)
nRBC: 0 % (ref 0.0–0.2)

## 2023-03-04 LAB — GLUCOSE, CAPILLARY
Glucose-Capillary: 100 mg/dL — ABNORMAL HIGH (ref 70–99)
Glucose-Capillary: 100 mg/dL — ABNORMAL HIGH (ref 70–99)
Glucose-Capillary: 106 mg/dL — ABNORMAL HIGH (ref 70–99)
Glucose-Capillary: 110 mg/dL — ABNORMAL HIGH (ref 70–99)
Glucose-Capillary: 112 mg/dL — ABNORMAL HIGH (ref 70–99)
Glucose-Capillary: 94 mg/dL (ref 70–99)

## 2023-03-04 LAB — BASIC METABOLIC PANEL
Anion gap: 9 (ref 5–15)
BUN: 57 mg/dL — ABNORMAL HIGH (ref 8–23)
CO2: 19 mmol/L — ABNORMAL LOW (ref 22–32)
Calcium: 8 mg/dL — ABNORMAL LOW (ref 8.9–10.3)
Chloride: 114 mmol/L — ABNORMAL HIGH (ref 98–111)
Creatinine, Ser: 1.14 mg/dL (ref 0.61–1.24)
GFR, Estimated: >60 mL/min (ref >60)
Glucose, Bld: 108 mg/dL — ABNORMAL HIGH (ref 70–99)
Potassium: 4.4 mmol/L (ref 3.5–5.1)
Sodium: 142 mmol/L (ref 135–145)

## 2023-03-04 MED ORDER — MIDODRINE HCL 5 MG PO TABS
5.0000 mg | ORAL_TABLET | Freq: Three times a day (TID) | ORAL | Status: DC
  Administered 2023-03-04 – 2023-03-06 (×7): 5 mg via JEJUNOSTOMY
  Filled 2023-03-04 (×7): qty 1

## 2023-03-04 MED ORDER — ZINC CHLORIDE 1 MG/ML IV SOLN
INTRAVENOUS | Status: AC
  Filled 2023-03-04: qty 684.8

## 2023-03-04 NOTE — Progress Notes (Signed)
Inpatient Rehabilitation Admissions Coordinator   We will sign off at this time.  Ottie Glazier, RN, MSN Rehab Admissions Coordinator [phone] 03/04/2023 10:39 AM

## 2023-03-04 NOTE — Progress Notes (Signed)
General Surgery Follow Up Note  Subjective:    Overnight Issues: Levophed at 12 this morning. Afebrile. Good UOP. BUN continues to downtrend. Tolerating feeds at 40. Hgb stable.  Objective:  Vital signs for last 24 hours: Temp:  [97.7 F (36.5 C)-98.2 F (36.8 C)] 97.8 F (36.6 C) (05/28 0400) Pulse Rate:  [84-104] 93 (05/28 0745) Resp:  [13-26] 22 (05/28 0745) BP: (81-132)/(45-104) 110/56 (05/28 0745) SpO2:  [98 %-100 %] 99 % (05/28 0745)  Hemodynamic parameters for last 24 hours:    Intake/Output from previous day: 05/27 0701 - 05/28 0700 In: 5782.8 [I.V.:4500.5; NG/GT:936.8; IV Piggyback:345.5] Out: 4098 [JXBJY:7829; Emesis/NG output:1200; Drains:370]  Intake/Output this shift: No intake/output data recorded.  Vent settings for last 24 hours:    Physical Exam:  Gen: NAD, appears frail and ill Neuro: lethargic HEENT: scleral icterus; NG in place draining gastric contents, nonbloody CV: RRR Pulm: nonlabored respirations on room air Abd: soft, nondistended, midline wound with fibrinous exudate, fascia in tact. LUQ JP with light brown fluid. Duodenostomy to gravity with brown drainage. T tube bilious. Ileostomy productive of liquid stool, nonbloody. Extr: wwp, no edema Skin: jaundiced GU: foley in place, dark urine  Results for orders placed or performed during the hospital encounter of 01/13/23 (from the past 24 hour(s))  Glucose, capillary     Status: Abnormal   Collection Time: 03/03/23 11:35 AM  Result Value Ref Range   Glucose-Capillary 107 (H) 70 - 99 mg/dL  Glucose, capillary     Status: None   Collection Time: 03/03/23  3:43 PM  Result Value Ref Range   Glucose-Capillary 98 70 - 99 mg/dL  Glucose, capillary     Status: None   Collection Time: 03/03/23  7:35 PM  Result Value Ref Range   Glucose-Capillary 98 70 - 99 mg/dL  Glucose, capillary     Status: Abnormal   Collection Time: 03/03/23 11:39 PM  Result Value Ref Range   Glucose-Capillary 101 (H)  70 - 99 mg/dL  Glucose, capillary     Status: Abnormal   Collection Time: 03/04/23  3:38 AM  Result Value Ref Range   Glucose-Capillary 112 (H) 70 - 99 mg/dL  CBC     Status: Abnormal   Collection Time: 03/04/23  4:44 AM  Result Value Ref Range   WBC 15.0 (H) 4.0 - 10.5 K/uL   RBC 2.96 (L) 4.22 - 5.81 MIL/uL   Hemoglobin 8.9 (L) 13.0 - 17.0 g/dL   HCT 56.2 (L) 13.0 - 86.5 %   MCV 92.2 80.0 - 100.0 fL   MCH 30.1 26.0 - 34.0 pg   MCHC 32.6 30.0 - 36.0 g/dL   RDW 78.4 (H) 69.6 - 29.5 %   Platelets 197 150 - 400 K/uL   nRBC 0.0 0.0 - 0.2 %  Basic metabolic panel     Status: Abnormal   Collection Time: 03/04/23  4:44 AM  Result Value Ref Range   Sodium 142 135 - 145 mmol/L   Potassium 4.4 3.5 - 5.1 mmol/L   Chloride 114 (H) 98 - 111 mmol/L   CO2 19 (L) 22 - 32 mmol/L   Glucose, Bld 108 (H) 70 - 99 mg/dL   BUN 57 (H) 8 - 23 mg/dL   Creatinine, Ser 2.84 0.61 - 1.24 mg/dL   Calcium 8.0 (L) 8.9 - 10.3 mg/dL   GFR, Estimated >13 >24 mL/min   Anion gap 9 5 - 15  Glucose, capillary     Status: Abnormal  Collection Time: 03/04/23  7:27 AM  Result Value Ref Range   Glucose-Capillary 100 (H) 70 - 99 mg/dL    Assessment & Plan: The plan of care was discussed with the bedside nurse for the day, who is in agreement with this plan and no additional concerns were raised.   Present on Admission:  Bowel obstruction (HCC)  Protein-calorie malnutrition, severe (HCC)  Duodenal obstruction    LOS: 50 days   Additional comments:I reviewed the patient's new clinical lab test results.   and I reviewed the patients new imaging test results.    67 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  4/22 - Ex lap, adhesiolysis, duodenojejunal bypass, duodenostomy tube placement, T tube placement in common bile duct. 4/23 - Takeback for bleeding with hemorrhagic shock, resection of ischemic transverse colon, left in discontinuity with Abthera 4/24 - Takeback for bleeding, abdominal washout, ileocecectomy, pack  placement, placement of Abthera 4/25 - Takeback, resection of 10cm necrotic distal ileum, washout, removal of packs, placement of Abthera 4/27 - Takeback, placement of J tube, end ileostomy, abdominal closure   - Hyperbilirubinemia: Tube cholangiogram on 5/3 showed patency of the biliary tree with no obstruction. Likely multifactorial due to massive transfusion with hemolysis and shock liver. Bilirubin is very slowly downtrending. Synthetic function is normal. Continue trending LFTs twice weekly. - Keep T tube and D tube to gravity drainage. 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 - Pain: prn dilaudid - Wound care: Saline wet-to-dry to midline wound - ID: None, previously completed course of ciprofloxacin for Pseudomonas pneumonia. WBC downtrending. No undrained intraabdominal collections on most recent CT. - FEN: NPO.  Continue full strength TPN and NG decompression. Continue tube feeds at 40, do not advance today given increasing levophed. BUN downtrending with decreased protein load in TPN, no further evidence of GI bleeding. - Hypotension: Patient has chronic low BP and has been on levophed for last few days. Begin midodrine 5mg  TID via J tube. - Urinary retention: Foley replaced on 5/20 for second time for urinary retention. Keep in place. - WOC following for ostomy care and teaching - PT/OT following. Patient is profoundly deconditioned. - GI bleeding: Resolved, hgb stable, drains and ileostomy output appear nonbloody. - VTE: SCDs, lovenox on hold given recent GI bleeding.  - Dispo: ICU  Sophronia Simas, MD [facility] Surgery General, Hepatobiliary and Pancreatic Surgery 03/04/23 8:11 AM

## 2023-03-04 NOTE — Progress Notes (Signed)
PHARMACY - TOTAL PARENTERAL NUTRITION CONSULT NOTE  Indication: Prolonged ileus, intolerance to enteral feeding  Patient Measurements: Height: 6\' 1"  (185.4 cm) Weight: 61 kg (134 lb 7.7 oz) IBW/kg (Calculated) : 79.9 TPN AdjBW (KG): 65.5 Body mass index is 17.74 kg/m.  Assessment: 2 yom with hx intestinal surgery and subsequent SBO requiring additional resection, admitted 01/13/23 with recurrent issues with SBO at the duodenum. Now s/p duodenal bypass complicated due to adhesions and resulted in small common bile duct injury requiring T-tube placement and external duodenostomy. Patient with massive bleed/hemorrhagic shock, s/p MTP. TPN initiated 4/9-5/6, then transitioned to TF. TF then held 5/14 given small bowel distension secondary to suspected ileus and likely dysmotility. Pharmacy consulted to resume TPN 5/16.  Glucose / Insulin: no hx DM - CBGs < 180.  No SSI. Electrolytes: K 4.4 (goal >/= 4 for ileus), Na 142 (none in TPN, FW 30mL BID for Jtube patency), high CL and low CO2, Phos elevated at 4.8 (none in TPN), others WNL Renal: SCr down to 1.14 (BL ~0.7-0.9), BUN 57 Hepatic: LFTs elevated and stable, tbili up to 28.4, TG WNL, albumin 1.9 Intake / Output; MIVF: UOP 1.5 ml/kg/hr, NGT , Biliary T-tube 35mL, RLQ jejunostomy 250 mL;  NS @ 75 ml/hr GI Imaging:  5/23 CT abd pelvis - post-surgical changes, questionable pneumonia  5/14 CT abd pelvis - feculent material throughout the SB/abdomen, suspicious for SB obstruction, bowel perforation/dehiscence, interval development of gas-containing fluid collection in R paracolic gutter 5/3 CT abdomen: bowel edema, no cause identified for T-bili elevation  5/3 cholangiogram: mild dilation of bilary tract   4/4 UGI: 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 4/23 - Takeback for bleeding with hemorrhagic shock, resection of ischemic transverse colon, left in discontinuity with Abthera 4/24 - Takeback for bleeding, abdominal washout, ileocecectomy, pack placement, placement of Abthera 4/25 - Takeback, resection of 10cm necrotic distal ileum, washout, removal of packs, placement of Abthera 4/27 - Takeback, placement of J tube, end ileostomy, abdominal closure  Central access: triple lumen PICC placed 01/23/23 TPN start date: 01/14/23-02/10/23, 5/16>>  Nutritional Goals: Goal TPN rate is 95 mL/hr (provides 134 g of protein and 2307 kcals per day)  RD Estimated Needs Total Energy Estimated Needs: 2300 - 2600 - discussed with Pharmacy try to meet higher end of needs Total Protein Estimated Needs: 130-145 grams Total Fluid Estimated Needs: 2.1L/day  Current Nutrition:  TPN Pivot 1.5 at 73mL/hr via J-tube (goal rate 70 ml/hr)  Plan:  Discussed with CCS, Renal, and RD on 5/23. Reducing protein amount in an attempt to limit BUN increase. Aware of high lipid content, 30% of total calories but over 1 g/kg/day.  CHO and ILE content maximized to meet caloric needs.    Pivot continues @ 40 ml/hr = 1440 kCal and 90g AA, meeting > 50% of needs.  Continue with reduced rate TPN.No plans to advance feeds today. TPN at 65 ml/hr at 1800 (goal rate 95 ml/hr), meeting ~70% of needs Electrolytes in TPN: Na 8mEq/L since 5/17, K 43mEq/L, Ca 83mEq/L, Mag 107mEq/L, Phos 48mmol/L since 5/16, max acetate - adjusted with reduced TPN rate Add standard MVI to TPN.  Remove standard trace elements with elevated Tbili and add back zinc 5mg , continue to hold chromium, and selenium.  NS at 75 ml/hr per MD  Monitor standard TPN  labs on Mon/Thurs (daily BMET through 5/28 per MD) F/u TF tolerance/advancement    Calton Dach, PharmD Clinical Pharmacist 03/04/2023 8:14 AM

## 2023-03-05 LAB — BPAM RBC
Blood Product Expiration Date: 202405272359
Blood Product Expiration Date: 202405302359
Unit Type and Rh: 9500

## 2023-03-05 LAB — GLUCOSE, CAPILLARY
Glucose-Capillary: 104 mg/dL — ABNORMAL HIGH (ref 70–99)
Glucose-Capillary: 108 mg/dL — ABNORMAL HIGH (ref 70–99)
Glucose-Capillary: 108 mg/dL — ABNORMAL HIGH (ref 70–99)
Glucose-Capillary: 108 mg/dL — ABNORMAL HIGH (ref 70–99)
Glucose-Capillary: 109 mg/dL — ABNORMAL HIGH (ref 70–99)
Glucose-Capillary: 96 mg/dL (ref 70–99)

## 2023-03-05 LAB — TYPE AND SCREEN: ABO/RH(D): O NEG

## 2023-03-05 LAB — BASIC METABOLIC PANEL
Anion gap: 15 (ref 5–15)
BUN: 62 mg/dL — ABNORMAL HIGH (ref 8–23)
CO2: 18 mmol/L — ABNORMAL LOW (ref 22–32)
Calcium: 8.1 mg/dL — ABNORMAL LOW (ref 8.9–10.3)
Chloride: 109 mmol/L (ref 98–111)
Creatinine, Ser: 1.15 mg/dL (ref 0.61–1.24)
GFR, Estimated: >60 mL/min (ref >60)
Glucose, Bld: 112 mg/dL — ABNORMAL HIGH (ref 70–99)
Potassium: 4.3 mmol/L (ref 3.5–5.1)
Sodium: 142 mmol/L (ref 135–145)

## 2023-03-05 MED ORDER — LACTATED RINGERS IV BOLUS
500.0000 mL | Freq: Once | INTRAVENOUS | Status: AC
  Administered 2023-03-05: 500 mL via INTRAVENOUS

## 2023-03-05 MED ORDER — FENTANYL 12 MCG/HR TD PT72
1.0000 | MEDICATED_PATCH | TRANSDERMAL | Status: DC
  Administered 2023-03-05 – 2023-03-08 (×2): 1 via TRANSDERMAL
  Filled 2023-03-05 (×2): qty 1

## 2023-03-05 MED ORDER — ALBUMIN HUMAN 5 % IV SOLN
25.0000 g | Freq: Once | INTRAVENOUS | Status: AC
  Administered 2023-03-05: 25 g via INTRAVENOUS
  Filled 2023-03-05: qty 500

## 2023-03-05 MED ORDER — HYDROMORPHONE HCL 1 MG/ML IJ SOLN
0.2500 mg | INTRAMUSCULAR | Status: DC | PRN
  Administered 2023-03-05 – 2023-03-06 (×4): 0.25 mg via INTRAVENOUS
  Administered 2023-03-06: 0.5 mg via INTRAVENOUS
  Administered 2023-03-06: 0.25 mg via INTRAVENOUS
  Administered 2023-03-06 (×3): 0.5 mg via INTRAVENOUS
  Administered 2023-03-07 – 2023-03-08 (×4): 0.25 mg via INTRAVENOUS
  Administered 2023-03-09: 0.5 mg via INTRAVENOUS
  Filled 2023-03-05 (×16): qty 1

## 2023-03-05 MED ORDER — PIVOT 1.5 CAL PO LIQD
1000.0000 mL | ORAL | Status: DC
  Administered 2023-03-05 – 2023-03-06 (×2): 1000 mL
  Filled 2023-03-05 (×2): qty 1000

## 2023-03-05 MED ORDER — ZINC CHLORIDE 1 MG/ML IV SOLN
INTRAVENOUS | Status: AC
  Filled 2023-03-05: qty 1000.9

## 2023-03-05 MED ORDER — VASOPRESSIN 20 UNIT/ML IV SOLN
4.0000 [IU] | Freq: Once | INTRAVENOUS | Status: AC
  Administered 2023-03-05: 4 [IU] via INTRAVENOUS
  Filled 2023-03-05: qty 0.2

## 2023-03-05 NOTE — Progress Notes (Signed)
Physical Therapy Treatment Patient Details Name: Bradley Dominguez MRN: [id number] DOB: [birth year] Today's Date: 03/05/2023   History of Present Illness 67 yo M HF, chronic partial small bowel obstruction and multiple previous abd surgeries  who was admitted  4/8 from home w n/v/bloating. Underwent EGD 4/11 -- concerning for duodenal obstruction. Developed hemorrhagic shock 4/15 in the setting of hematemesis / hematochezia, decompensated requiring, intubation, embolization of jejunal arcade branch. Extubated 01/22/23. Underwent ex lap adhesiolysis, duodenojejunal bypass, duodenostomy tube placement, T tube placement in common bile duct 4/22, transferred to [facility] 4/23 with hemorrhagic shock, return to OR 4/23, 4/24, 4/25 and finally 4/27 with placement of J tube, end ileostomy, abdominal closure.  Extubated 4/30.  NGT placed 5/14 due to possible ileus.    PT Comments    Patient continues not to tolerate mobility and refusing even in bed therex.  Discussed with wife tilt bed and possible could help him mobility though he does not tolerate upright well at all.  Ordered per MD.  Has not met any LTG's so goals downgraded this session.  Hopeful for improved tolerance using tilt bed for mobility.  PT will continue to follow.    Recommendations for follow up therapy are one component of a multi-disciplinary discharge planning process, led by the attending physician.  Recommendations may be updated based on patient status, additional functional criteria and insurance authorization.  Follow Up Recommendations       Assistance Recommended at Discharge Frequent or constant Supervision/Assistance  Patient can return home with the following Two people to help with walking and/or transfers;Assistance with cooking/housework;Direct supervision/assist for medications management;Assist for transportation;Help with stairs or ramp for entrance;Two people to help with bathing/dressing/bathroom   Equipment Recommendations   Other (comment) (TBA)    Recommendations for Other Services       Precautions / Restrictions Precautions Precautions: Fall Precaution Comments: multiple lines, JP drain, biliary tube, ileostomy; abdominal incision; NGT to suction     Mobility  Bed Mobility Overal bed mobility: Needs Assistance Bed Mobility: Rolling Rolling: Max assist, +2 for physical assistance         General bed mobility comments: rolled to place new bed pad then scooted up in bed and HOB up, pt c/o pain on buttock so lowered HOB and placed pillow under L hip    Transfers                   General transfer comment: NT    Ambulation/Gait                   Stairs             Wheelchair Mobility    Modified Rankin (Stroke Patients Only)       Balance                                            Cognition Arousal/Alertness: Awake/alert Behavior During Therapy: Flat affect Overall Cognitive Status: Impaired/Different from baseline Area of Impairment: Attention, Following commands, Problem solving                   Current Attention Level: Sustained   Following Commands: Follows one step commands with increased time, Follows one step commands inconsistently     Problem Solving: Slow processing, Decreased initiation, Requires tactile cues, Requires verbal cues General Comments: refusing mobility and LE therex, wife present  and attempting to encourage        Exercises General Exercises - Lower Extremity Heel Slides: AAROM, Left (3 reps then pt would not assist any more)    General Comments General comments (skin integrity, edema, etc.): Spoke with wife after messaging MD about tilt bed, then ordered.  RN reports seems more painful, wife reports new Fentnyl patch.      Pertinent Vitals/Pain Pain Assessment Pain Assessment: Faces Faces Pain Scale: Hurts even more Pain Location: generalized with mobility Pain Descriptors / Indicators:  Discomfort, Grimacing Pain Intervention(s): Monitored during session, Repositioned, Limited activity within patient's tolerance, Patient requesting pain meds-RN notified    Home Living                          Prior Function            PT Goals (current goals can now be found in the care plan section) Acute Rehab PT Goals Patient Stated Goal: to tolerate moving PT Goal Formulation: With family Time For Goal Achievement: 03/19/23 Potential to Achieve Goals: Fair Progress towards PT goals: Not progressing toward goals - comment;Goals downgraded-see care plan    Frequency    Min 3X/week      PT Plan Current plan remains appropriate    Co-evaluation              AM-PAC PT "6 Clicks" Mobility   Outcome Measure  Help needed turning from your back to your side while in a flat bed without using bedrails?: Total Help needed moving from lying on your back to sitting on the side of a flat bed without using bedrails?: Total Help needed moving to and from a bed to a chair (including a wheelchair)?: Total Help needed standing up from a chair using your arms (e.g., wheelchair or bedside chair)?: Total Help needed to walk in hospital room?: Total   6 Click Score: 5    End of Session   Activity Tolerance: Patient limited by fatigue;Patient limited by pain Patient left: in bed;with call bell/phone within reach;with family/visitor present Nurse Communication: Other (comment) (thoughts about tilt bed) PT Visit Diagnosis: Other abnormalities of gait and mobility (R26.89);Muscle weakness (generalized) (M62.81);Other symptoms and signs involving the nervous system (R29.898)     Time: 1400-1420 PT Time Calculation (min) (ACUTE ONLY): 20 min  Charges:  $Therapeutic Activity: 8-22 mins                     Sheran Lawless, PT Acute Rehabilitation Services Office:[phone] 03/05/2023    Bradley Dominguez 03/05/2023, 5:25 PM

## 2023-03-05 NOTE — Progress Notes (Signed)
PHARMACY - TOTAL PARENTERAL NUTRITION CONSULT NOTE  Indication: Prolonged ileus, intolerance to enteral feeding  Patient Measurements: Height: 6\' 1"  (185.4 cm) Weight: 61 kg (134 lb 7.7 oz) IBW/kg (Calculated) : 79.9 TPN AdjBW (KG): 65.5 Body mass index is 17.74 kg/m.  Assessment: 67 yo M with hx intestinal surgery and subsequent SBO requiring additional resection, admitted 01/13/23 with recurrent issues with SBO at the duodenum. Now s/p duodenal bypass complicated due to adhesions and resulted in small common bile duct injury requiring T-tube placement and external duodenostomy. Patient with massive bleed/hemorrhagic shock, s/p MTP. TPN initiated 4/9-5/6, then transitioned to TF. TF then held 5/14 given small bowel distension secondary to suspected ileus and likely dysmotility. Pharmacy consulted to resume TPN 5/16.  Glucose / Insulin: no hx DM - CBGs < 180.  No SSI. Electrolytes: K 4.3 (goal >/= 4 for ileus), Na 142 (none in TPN, FW 30mL BID for Jtube patency), low CO2, Phos elevated at 4.8 (none in TPN), others WNL Renal: SCr 1.15 (BL ~0.7-0.9), BUN 62 Hepatic: LFTs elevated and stable, tbili up to 28.4, TG WNL, albumin 1.9 Intake / Output; MIVF: UOP 1.3 ml/kg/hr, NGT , Biliary T-tube 0mL, RUQ jejunostomy 550 mL;  NS @ 75 ml/hr, LR and albumin boluses today per MD GI Imaging:  5/23 CT abd pelvis - post-surgical changes, questionable pneumonia  5/14 CT abd pelvis - feculent material throughout the SB/abdomen, suspicious for SB obstruction, bowel perforation/dehiscence, interval development of gas-containing fluid collection in R paracolic gutter 5/3 CT abdomen: bowel edema, no cause identified for T-bili elevation  5/3 cholangiogram: mild dilation of bilary tract   4/4 UGI: Findings suggestive of chronic pSBO or ileus 4/9 CT: Ileus vs high grade partial SBO GI Surgeries / Procedures: 4/22 - Ex lap, adhesiolysis, duodenojejunal bypass, duodenostomy tube placement, T tube placement in  common bile duct. 4/23 - Takeback for bleeding with hemorrhagic shock, resection of ischemic transverse colon, left in discontinuity with Abthera 4/24 - Takeback for bleeding, abdominal washout, ileocecectomy, pack placement, placement of Abthera 4/25 - Takeback, resection of 10cm necrotic distal ileum, washout, removal of packs, placement of Abthera 4/27 - Takeback, placement of J tube, end ileostomy, abdominal closure  Central access: triple lumen PICC placed 01/23/23 TPN start date: 01/14/23-02/10/23, 5/16>>  Nutritional Goals: Goal TPN rate is 95 mL/hr (provides 134 g of protein and 2307 kcals per day)  RD Estimated Needs Total Energy Estimated Needs: 2300 - 2600 - discussed with Pharmacy try to meet higher end of needs Total Protein Estimated Needs: 130-145 grams Total Fluid Estimated Needs: 2.1L/day  Current Nutrition:  TPN Pivot 1.5 at 40 mL/hr via J-tube (goal rate 70 ml/hr)  Plan:  Discussed with CCS, Renal, and RD on 5/23. Reducing protein amount in an attempt to limit BUN increase. Aware of high lipid content, 30% of total calories but over 1 g/kg/day.  CHO and ILE content maximized to meet caloric needs.    Pivot advancing to 50 ml/hr.  Discussed with MD and RD: Return TPN back to goal rate (44ml/hr - ~2600 kcal with 100g protein, reduced as above) to meet 100% of needs, in addition to advancing TF given severe malnutrition and poor reserves.  Electrolytes in TPN: Na 96mEq/L since 5/17, K 86mEq/L, Ca 50mEq/L, Mag 80mEq/L, Phos 36mmol/L since 5/16, max acetate.  Add standard MVI to TPN.  Remove standard trace elements with elevated Tbili and add back zinc 5mg , continue to hold chromium, and selenium.  NS at 75 ml/hr per MD  Monitor  standard TPN labs on Mon/Thurs  F/u TF tolerance/advancement   Calton Dach, PharmD Clinical Pharmacist 03/05/2023 7:54 AM

## 2023-03-05 NOTE — Progress Notes (Signed)
Nutrition Follow-up  DOCUMENTATION CODES:   Severe malnutrition in context of chronic illness  INTERVENTION:   TPN at full support; pt with severe malnutrition and unsure how much enteral nutrition support is absorbed  Pivot 1.5 @ 50 ml/hr (1800 kcal and 112 grams protein)  Recommend goal rate of 70 ml/hr Provides: 2520 kcal, 157 grams protein, and 1276 ml free water   NUTRITION DIAGNOSIS:   Severe Malnutrition related to chronic illness as evidenced by moderate fat depletion, severe muscle depletion, energy intake < or equal to 75% for > or equal to 1 month, percent weight loss. Ongoing  GOAL:   Patient will meet greater than or equal to 90% of their needs Met  MONITOR:   Labs, Weight trends, Skin, I & O's  REASON FOR ASSESSMENT:   Consult Enteral/tube feeding initiation and management (advance tube feeds to goal)  ASSESSMENT:   67 yo male who was admitted from home with worsening nausea, vomiting, bloating and weakness. Patient with remote history of multiple prior laparotomies (in the 1970's), with a chronic partial small bowel obstruction.  TPN decreased over the weekend as TF increased. Discussed in round. Unsure how well pt absorbing TF. Will continue full TPN as TF increases. Monitor exam and weight trends to determine when to decrease TPN.     04/08 - admitted, PICC line placed 04/09 - TPN initiated 04/13 - TPN increased to goal 04/14 - emergent EGD showing oozing gastric ulcer s/p epi and clips 04/15 - IR for emergent embolization, intubated 04/22 - s/p ex lap, LOA, duodenojejunal bypass with biliary T-tube, duodenostomy tube 04/23 - s/p second OR for ex lap with transverse colectomy, abd packed and open abd, left in discontinuity due to colonic ischemia, ligation of bleeding mesenteric vessels 04/24 - OR due to acute decompensation s/p ex lap with ileocectomy, temp abd closure, MTP (did not receive TPN due to contaminated port) 04/25 - s/p ex lap, washout,  SBR (10 cm necrotic distal ileum), VAC  04/27 - s/p ileostomy, J-tube placement, abd closure  04/30 - extubated  05/05 - TPN cycled  05/06 - TPN stopped due to elevated bilirubin 05/07 - trickle TF started via J-tube 05/09 - TF advancing to goal rate 05/14 - CT showing diffuse small bowel dilation and gastric distention, TF d/c, NG tube placed to LIWS 05/16 - TPN restarted 05/23 - TPN adjusted per provider; meeting 100% kcal needs and only 76% of minimum protein needs- able to start very low trickle TF via J-tube   Admission weight: 68.1 kg  Current weight: 61 kg  Medications reviewed and include: 200 ml free water via J tube every 12 hours IV protonix NS @ 75 ml/hr Levophed @ 4 mcg   TPN via PICC @ 95 ml/hr 2600 kcal, 100 grams protein Standard MVI Standard trace elements removed, zinc added back in (chromium and selenium held)  Labs reviewed:  Na 142 Cl 120 - > 112 -> 109 BUN 105 -> 111 -> 111 -> 62  UOP: 1870 ml x 24 hours NG tube: 1000 ml x 24 hours Biliary T tube: 0 ml x 24 hours L abd JP: 30 ml x 24 hours 24 F RUQ: 550 ml x 24 hours Ileostomy: 100 ml x 24 hours 16 F J-tube;TF I/O's: +1.3 L since admit    Diet Order:   Diet Order             Diet NPO time specified Except for: Ice Chips  Diet effective now  EDUCATION NEEDS:   Education needs have been addressed  Skin:  Skin Assessment: Skin Integrity Issues: Incisions: abd  Last BM:  5/21 10 ml via ileostomy  Height:   Ht Readings from Last 1 Encounters:  01/27/23 6\' 1"  (1.854 m)    Weight:   Wt Readings from Last 1 Encounters:  03/05/23 61 kg    BMI:  Body mass index is 17.74 kg/m.  Estimated Nutritional Needs:   Kcal:  2300 - 2600 - discussed with Pharmacy try to meet higher end of needs  Protein:  130-145 grams  Fluid:  2.1L/day   Zaniah Titterington P., RD, LDN, CNSC See AMiON for contact information

## 2023-03-05 NOTE — Progress Notes (Signed)
General Surgery Follow Up Note  Subjective:    Overnight Issues: Levophed at 6. Tolerating feeds at 40. BUN relatively stable. Received albumin overnight.  Objective:  Vital signs for last 24 hours: Temp:  [97.6 F (36.4 C)-99.2 F (37.3 C)] 98.3 F (36.8 C) (05/29 0400) Pulse Rate:  [55-104] 81 (05/29 0745) Resp:  [12-34] 15 (05/29 0745) BP: (69-108)/(42-74) 95/54 (05/29 0745) SpO2:  [88 %-100 %] 100 % (05/29 0745) Weight:  [61 kg] 61 kg (05/29 0702)  Hemodynamic parameters for last 24 hours:    Intake/Output from previous day: 05/28 0701 - 05/29 0700 In: 4924.4 [I.V.:3894.2; NG/GT:920; IV Piggyback:110.2] Out: 3550 [Urine:1870; Emesis/NG output:1000; Drains:580; Stool:100]  Intake/Output this shift: No intake/output data recorded.  Vent settings for last 24 hours:    Physical Exam:  Gen: NAD, appears frail and ill Neuro: lethargic HEENT: scleral icterus; NG in place draining gastric contents, nonbloody CV: RRR Pulm: nonlabored respirations on room air Abd: soft, nondistended, midline wound with fibrinous exudate, fascia in tact. LUQ JP with light brown fluid. Duodenostomy to gravity with brown drainage. T tube with scant bilious fluid. Ileostomy productive of liquid stool, nonbloody. Extr: wwp, no edema Skin: jaundiced GU: foley in place, dark urine  Results for orders placed or performed during the hospital encounter of 01/13/23 (from the past 24 hour(s))  Glucose, capillary     Status: Abnormal   Collection Time: 03/04/23 11:16 AM  Result Value Ref Range   Glucose-Capillary 100 (H) 70 - 99 mg/dL  Glucose, capillary     Status: Abnormal   Collection Time: 03/04/23  3:49 PM  Result Value Ref Range   Glucose-Capillary 110 (H) 70 - 99 mg/dL  Glucose, capillary     Status: None   Collection Time: 03/04/23  7:40 PM  Result Value Ref Range   Glucose-Capillary 94 70 - 99 mg/dL  Glucose, capillary     Status: Abnormal   Collection Time: 03/04/23 11:39 PM   Result Value Ref Range   Glucose-Capillary 106 (H) 70 - 99 mg/dL  Glucose, capillary     Status: Abnormal   Collection Time: 03/05/23  3:20 AM  Result Value Ref Range   Glucose-Capillary 108 (H) 70 - 99 mg/dL  Basic metabolic panel     Status: Abnormal   Collection Time: 03/05/23  6:00 AM  Result Value Ref Range   Sodium 142 135 - 145 mmol/L   Potassium 4.3 3.5 - 5.1 mmol/L   Chloride 109 98 - 111 mmol/L   CO2 18 (L) 22 - 32 mmol/L   Glucose, Bld 112 (H) 70 - 99 mg/dL   BUN 62 (H) 8 - 23 mg/dL   Creatinine, Ser 1.61 0.61 - 1.24 mg/dL   Calcium 8.1 (L) 8.9 - 10.3 mg/dL   GFR, Estimated >09 >60 mL/min   Anion gap 15 5 - 15    Assessment & Plan: The plan of care was discussed with the bedside nurse for the day, who is in agreement with this plan and no additional concerns were raised.   Present on Admission:  Bowel obstruction (HCC)  Protein-calorie malnutrition, severe (HCC)  Duodenal obstruction    LOS: 51 days   Additional comments:I reviewed the patient's new clinical lab test results.   and I reviewed the patients new imaging test results.    67 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  4/22 - Ex lap, adhesiolysis, duodenojejunal bypass, duodenostomy tube placement, T tube placement in common bile duct. 4/23 - Takeback for bleeding  with hemorrhagic shock, resection of ischemic transverse colon, left in discontinuity with Abthera 4/24 - Takeback for bleeding, abdominal washout, ileocecectomy, pack placement, placement of Abthera 4/25 - Takeback, resection of 10cm necrotic distal ileum, washout, removal of packs, placement of Abthera 4/27 - Takeback, placement of J tube, end ileostomy, abdominal closure   - Hyperbilirubinemia: Tube cholangiogram on 5/3 showed patency of the biliary tree with no obstruction. Likely multifactorial due to massive transfusion with hemolysis and shock liver. Bilirubin stable. Synthetic function is normal. Continue trending LFTs twice weekly. - Keep  T tube and D tube to gravity drainage. 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 - Pain: prn dilaudid. Add low-dose fentanyl patch today. - Wound care: Saline wet-to-dry to midline wound - ID: None, previously completed course of ciprofloxacin for Pseudomonas pneumonia. No undrained intraabdominal collections on most recent CT. - FEN: NPO. Continue full strength TPN and NG decompression. Advance tube feeds to 50 ml/hr today. BUN downtrending with decreased protein load in TPN, no further evidence of GI bleeding. - Hypotension: Patient has chronic low BP. Volume depletion from GI losses is also contributing. Continue midodrine 5mg  TID, give crystalloid and albumin boluses today. Wean levophed for MAP>60. - Urinary retention: Foley replaced on 5/20 for second time for urinary retention. Keep in place. - WOC following for ostomy care and teaching - PT/OT following. Patient is profoundly deconditioned. - GI bleeding: Resolved, hgb stable, drains and ileostomy output appear nonbloody. - VTE: SCDs, lovenox on hold given recent GI bleeding.  - Dispo: ICU  Sophronia Simas, MD [facility] Surgery General, Hepatobiliary and Pancreatic Surgery 03/05/23 7:56 AM

## 2023-03-06 LAB — COMPREHENSIVE METABOLIC PANEL
ALT: 179 U/L — ABNORMAL HIGH (ref 0–44)
AST: 166 U/L — ABNORMAL HIGH (ref 15–41)
Albumin: 2.2 g/dL — ABNORMAL LOW (ref 3.5–5.0)
Alkaline Phosphatase: 154 U/L — ABNORMAL HIGH (ref 38–126)
Anion gap: 13 (ref 5–15)
BUN: 66 mg/dL — ABNORMAL HIGH (ref 8–23)
CO2: 15 mmol/L — ABNORMAL LOW (ref 22–32)
Calcium: 8.4 mg/dL — ABNORMAL LOW (ref 8.9–10.3)
Chloride: 114 mmol/L — ABNORMAL HIGH (ref 98–111)
Creatinine, Ser: 1.29 mg/dL — ABNORMAL HIGH (ref 0.61–1.24)
GFR, Estimated: >60 mL/min (ref >60)
Glucose, Bld: 116 mg/dL — ABNORMAL HIGH (ref 70–99)
Potassium: 4.5 mmol/L (ref 3.5–5.1)
Sodium: 142 mmol/L (ref 135–145)
Total Bilirubin: 29.3 mg/dL (ref 0.3–1.2)
Total Protein: 5.7 g/dL — ABNORMAL LOW (ref 6.5–8.1)

## 2023-03-06 LAB — GLUCOSE, CAPILLARY
Glucose-Capillary: 98 mg/dL (ref 70–99)
Glucose-Capillary: 105 mg/dL — ABNORMAL HIGH (ref 70–99)
Glucose-Capillary: 120 mg/dL — ABNORMAL HIGH (ref 70–99)
Glucose-Capillary: 126 mg/dL — ABNORMAL HIGH (ref 70–99)
Glucose-Capillary: 124 mg/dL — ABNORMAL HIGH (ref 70–99)
Glucose-Capillary: 129 mg/dL — ABNORMAL HIGH (ref 70–99)

## 2023-03-06 LAB — CBC
HCT: 26.8 % — ABNORMAL LOW (ref 39.0–52.0)
Hemoglobin: 8.6 g/dL — ABNORMAL LOW (ref 13.0–17.0)
MCH: 29.9 pg (ref 26.0–34.0)
MCHC: 32.1 g/dL (ref 30.0–36.0)
MCV: 93.1 fL (ref 80.0–100.0)
Platelets: 155 10*3/uL (ref 150–400)
RBC: 2.88 MIL/uL — ABNORMAL LOW (ref 4.22–5.81)
RDW: 27.4 % — ABNORMAL HIGH (ref 11.5–15.5)
WBC: 14.2 10*3/uL — ABNORMAL HIGH (ref 4.0–10.5)
nRBC: 0 % (ref 0.0–0.2)

## 2023-03-06 LAB — PHOSPHORUS: Phosphorus: 5.1 mg/dL — ABNORMAL HIGH (ref 2.5–4.6)

## 2023-03-06 LAB — MAGNESIUM: Magnesium: 2.4 mg/dL (ref 1.7–2.4)

## 2023-03-06 MED ORDER — ZINC OXIDE 40 % EX OINT
TOPICAL_OINTMENT | CUTANEOUS | Status: DC | PRN
  Filled 2023-03-06: qty 57

## 2023-03-06 MED ORDER — STERILE WATER FOR INJECTION IV SOLN
INTRAVENOUS | Status: DC
  Filled 2023-03-06: qty 150
  Filled 2023-03-06: qty 1000
  Filled 2023-03-06: qty 150
  Filled 2023-03-06 (×4): qty 1000

## 2023-03-06 MED ORDER — MIDODRINE HCL 5 MG PO TABS
10.0000 mg | ORAL_TABLET | Freq: Three times a day (TID) | ORAL | Status: DC
  Administered 2023-03-06 – 2023-03-09 (×8): 10 mg via JEJUNOSTOMY
  Filled 2023-03-06 (×8): qty 2

## 2023-03-06 MED ORDER — MIDODRINE HCL 5 MG PO TABS
5.0000 mg | ORAL_TABLET | Freq: Once | ORAL | Status: AC
  Administered 2023-03-06: 5 mg
  Filled 2023-03-06: qty 1

## 2023-03-06 MED ORDER — LACTATED RINGERS IV BOLUS
500.0000 mL | Freq: Once | INTRAVENOUS | Status: AC
  Administered 2023-03-06: 500 mL via INTRAVENOUS

## 2023-03-06 MED ORDER — ZINC CHLORIDE 1 MG/ML IV SOLN
INTRAVENOUS | Status: AC
  Filled 2023-03-06: qty 1000.9

## 2023-03-06 NOTE — Progress Notes (Signed)
PHARMACY - TOTAL PARENTERAL NUTRITION CONSULT NOTE  Indication: Prolonged ileus, intolerance to enteral feeding  Patient Measurements: Height: 6\' 1"  (185.4 cm) Weight: 61 kg (134 lb 7.7 oz) IBW/kg (Calculated) : 79.9 TPN AdjBW (KG): 65.5 Body mass index is 17.74 kg/m.  Assessment: 67 yo M with hx intestinal surgery and subsequent SBO requiring additional resection, admitted 01/13/23 with recurrent issues with SBO at the duodenum. Now s/p duodenal bypass complicated due to adhesions and resulted in small common bile duct injury requiring T-tube placement and external duodenostomy. Patient with massive bleed/hemorrhagic shock, s/p MTP. TPN initiated 4/9-5/6, then transitioned to TF. TF then held 5/14 given small bowel distension secondary to suspected ileus and likely dysmotility. Pharmacy consulted to resume TPN 5/16.  Glucose / Insulin: no hx DM - CBGs < 180.  No SSI. Electrolytes: K 4.5 (goal >/= 4 for ileus), Na 142 (none in TPN, FW 30mL BID for Jtube patency), low CO2, Phos elevated at 5.1 (none in TPN), others WNL Renal: SCr 1.29 (BL ~0.7-0.9), BUN 66 Hepatic: LFTs elevated and stable, tbili up to 29.3, TG WNL, albumin 2.2 Intake / Output; MIVF: UOP 1.2 ml/kg/hr, NGT , L JP Drain 220 mL; RUQ jejunostomy 1700  mL;  NS @ 75 ml/hr GI Imaging:  5/23 CT abd pelvis - post-surgical changes, questionable pneumonia  5/14 CT abd pelvis - feculent material throughout the SB/abdomen, suspicious for SB obstruction, bowel perforation/dehiscence, interval development of gas-containing fluid collection in R paracolic gutter 5/3 CT abdomen: bowel edema, no cause identified for T-bili elevation  5/3 cholangiogram: mild dilation of bilary tract   4/4 UGI: Findings suggestive of chronic pSBO or ileus 4/9 CT: Ileus vs high grade partial SBO GI Surgeries / Procedures: 4/22 - Ex lap, adhesiolysis, duodenojejunal bypass, duodenostomy tube placement, T tube placement in common bile duct. 4/23 - Takeback  for bleeding with hemorrhagic shock, resection of ischemic transverse colon, left in discontinuity with Abthera 4/24 - Takeback for bleeding, abdominal washout, ileocecectomy, pack placement, placement of Abthera 4/25 - Takeback, resection of 10cm necrotic distal ileum, washout, removal of packs, placement of Abthera 4/27 - Takeback, placement of J tube, end ileostomy, abdominal closure  Central access: triple lumen PICC placed 01/23/23 TPN start date: 01/14/23-02/10/23, 5/16>>  Nutritional Goals: Goal TPN rate is 95 mL/hr (provides 134 g of protein and 2307 kcals per day)  RD Estimated Needs Total Energy Estimated Needs: 2300 - 2600 - discussed with Pharmacy try to meet higher end of needs Total Protein Estimated Needs: 130-145 grams Total Fluid Estimated Needs: 2.1L/day  Current Nutrition:  TPN Pivot 1.5 at 50 mL/hr via J-tube (goal rate 70 ml/hr)  Plan:  Discussed with CCS, Renal, and RD on 5/23. Reducing protein amount in an attempt to limit BUN increase. Aware of high lipid content, 30% of total calories but over 1 g/kg/day.  CHO and ILE content maximized to meet caloric needs.    Pivot continuing at 50 ml/hr - 1800kcal with 112g protein. TPN at goal rate (80ml/hr - ~2600 kcal)  --total Kcal with TF plus TPN = 1800+2600=4400 kcal/day (72 kcal/kg/day) but some questionable absorption of TF given NGT output  Electrolytes in TPN: Na 4mEq/L since 5/17, K 50 mEq/L, Ca 40mEq/L, Mag 5 mEq/L, Phos 71mmol/L since 5/16, max acetate.  Standard MVI to TPN.  Remove standard trace elements, add back zinc 5mg  Continue to hold chromium, and selenium.  NS at 75 ml/hr per MD  Monitor standard TPN labs on Mon/Thurs plus daily BMET for BUN monitoring  F/u TF tolerance/advancement   Calton Dach, PharmD Clinical Pharmacist 03/06/2023 10:32 AM

## 2023-03-06 NOTE — Progress Notes (Signed)
OT Cancellation Note  Patient Details Name: Bradley Dominguez MRN: [id number] DOB: Aug 11, 1956   Cancelled Treatment:    Reason Eval/Treat Not Completed: Other (comment). Attempted to see pt this am with wife present however pt getting ready to have a procedure. Pt willing to work with OT. Will return later time as able. Discussed with nsg.   Caden Fukushima,HILLARY 03/06/2023, 11:25 AM Luisa Dago, OT/L   Acute OT Clinical Specialist Acute Rehabilitation Services Pager [phone] Office [phone]

## 2023-03-06 NOTE — Progress Notes (Signed)
General Surgery Follow Up Note  Subjective:    Overnight Issues: Levophed down to 4. No T tube output for last 24 hours, tube noted to be dislodged on exam this morning.   Objective:  Vital signs for last 24 hours: Temp:  [97.6 F (36.4 C)-98.3 F (36.8 C)] 98.1 F (36.7 C) (05/30 0400) Pulse Rate:  [25-196] 95 (05/30 0500) Resp:  [14-43] 18 (05/30 0500) BP: (76-107)/(42-70) 90/56 (05/30 0500) SpO2:  [92 %-100 %] 99 % (05/30 0500) Weight:  [61 kg] 61 kg (05/29 0702)  Hemodynamic parameters for last 24 hours:    Intake/Output from previous day: 05/29 0701 - 05/30 0700 In: 1610.9 [I.V.:3845.8; NG/GT:2200.8; IV Piggyback:847.6] Out: 5345 [Urine:1380; Emesis/NG output:2050; Drains:1650; Stool:265]  Intake/Output this shift: Total I/O In: 2553.3 [I.V.:2003.3; NG/GT:550] Out: 2370 [Urine:525; Emesis/NG output:1200; Drains:445; Stool:200]  Vent settings for last 24 hours:    Physical Exam:  Gen: NAD, appears frail and ill Neuro: lethargic HEENT: scleral icterus; NG in place draining gastric contents, nonbloody CV: RRR Pulm: nonlabored respirations on room air Abd: soft, nondistended, midline wound is granulating with minimal fibrinous exudate. LUQ JP with thin yellow fluid. Duodenostomy to gravity with brown drainage. T tube with no output, suture has pulled through the skin and tube has retracted by about 10cm. Ileostomy productive of liquid stool, nonbloody. Extr: wwp, no edema Skin: jaundiced GU: foley in place, dark urine  Results for orders placed or performed during the hospital encounter of 01/13/23 (from the past 24 hour(s))  Basic metabolic panel     Status: Abnormal   Collection Time: 03/05/23  6:00 AM  Result Value Ref Range   Sodium 142 135 - 145 mmol/L   Potassium 4.3 3.5 - 5.1 mmol/L   Chloride 109 98 - 111 mmol/L   CO2 18 (L) 22 - 32 mmol/L   Glucose, Bld 112 (H) 70 - 99 mg/dL   BUN 62 (H) 8 - 23 mg/dL   Creatinine, Ser 6.04 0.61 - 1.24 mg/dL    Calcium 8.1 (L) 8.9 - 10.3 mg/dL   GFR, Estimated >54 >09 mL/min   Anion gap 15 5 - 15  Glucose, capillary     Status: Abnormal   Collection Time: 03/05/23  7:55 AM  Result Value Ref Range   Glucose-Capillary 104 (H) 70 - 99 mg/dL  Glucose, capillary     Status: None   Collection Time: 03/05/23 11:34 AM  Result Value Ref Range   Glucose-Capillary 96 70 - 99 mg/dL  Glucose, capillary     Status: Abnormal   Collection Time: 03/05/23  4:06 PM  Result Value Ref Range   Glucose-Capillary 109 (H) 70 - 99 mg/dL  Glucose, capillary     Status: Abnormal   Collection Time: 03/05/23  7:36 PM  Result Value Ref Range   Glucose-Capillary 108 (H) 70 - 99 mg/dL  Glucose, capillary     Status: Abnormal   Collection Time: 03/05/23 11:41 PM  Result Value Ref Range   Glucose-Capillary 108 (H) 70 - 99 mg/dL  Glucose, capillary     Status: None   Collection Time: 03/06/23  4:13 AM  Result Value Ref Range   Glucose-Capillary 98 70 - 99 mg/dL    Assessment & Plan: The plan of care was discussed with the bedside nurse for the day, who is in agreement with this plan and no additional concerns were raised.   Present on Admission:  Bowel obstruction (HCC)  Protein-calorie malnutrition, severe (HCC)  Duodenal obstruction  LOS: 52 days   Additional comments:I reviewed the patient's new clinical lab test results.   and I reviewed the patients new imaging test results.    67 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  4/22 - Ex lap, adhesiolysis, duodenojejunal bypass, duodenostomy tube placement, T tube placement in common bile duct. 4/23 - Takeback for bleeding with hemorrhagic shock, resection of ischemic transverse colon, left in discontinuity with Abthera 4/24 - Takeback for bleeding, abdominal washout, ileocecectomy, pack placement, placement of Abthera 4/25 - Takeback, resection of 10cm necrotic distal ileum, washout, removal of packs, placement of Abthera 4/27 - Takeback, placement of J tube, end  ileostomy, abdominal closure   - Hyperbilirubinemia: Tube cholangiogram on 5/3 showed patency of the biliary tree with no obstruction. Likely multifactorial due to massive transfusion with hemolysis and shock liver. Trend LFTs twice weekly, labs pending today. - Keep D tube to gravity drainage. T tube had become dislodged and I removed it at the bedside this morning. Monitor for increased bilious output from JP drain.  - Pain: low-dose fentanyl patch, prn dilaudid. - Wound care: Dry gauze dressing to midline wound, granulating. - ID: None, previously completed course of ciprofloxacin for Pseudomonas pneumonia. No undrained intraabdominal collections on most recent CT. - FEN: NPO. Continue full strength TPN and NG decompression. Keep feeds at 50 ml/hr today. BUN has been elevated but downtrending, labs pending this morning. - Hypotension: Patient has chronic low BP. Volume depletion from GI losses is also contributing. Continue midodrine 5mg  TID. Wean levophed for MAP>60. - Urinary retention: Foley replaced on 5/20 for second time for urinary retention. Keep in place. - WOC following for ostomy care and teaching - PT/OT following. Patient is profoundly deconditioned, has made minimal PT progress recently. Transitioned to tilt bed per PT recs to help increase mobility. - GI bleeding: Resolved, drains and ileostomy output appear nonbloody. - VTE: SCDs, lovenox on hold given recent GI bleeding.  - Dispo: ICU  Sophronia Simas, MD [facility] Surgery General, Hepatobiliary and Pancreatic Surgery 03/06/23 5:51 AM

## 2023-03-07 ENCOUNTER — Inpatient Hospital Stay (HOSPITAL_COMMUNITY): Payer: BC Managed Care – PPO

## 2023-03-07 LAB — CBC
HCT: 26.9 % — ABNORMAL LOW (ref 39.0–52.0)
Hemoglobin: 8.8 g/dL — ABNORMAL LOW (ref 13.0–17.0)
MCH: 30.8 pg (ref 26.0–34.0)
MCHC: 32.7 g/dL (ref 30.0–36.0)
MCV: 94.1 fL (ref 80.0–100.0)
Platelets: 157 10*3/uL (ref 150–400)
RBC: 2.86 MIL/uL — ABNORMAL LOW (ref 4.22–5.81)
RDW: 27.2 % — ABNORMAL HIGH (ref 11.5–15.5)
WBC: 17.4 10*3/uL — ABNORMAL HIGH (ref 4.0–10.5)
nRBC: 0 % (ref 0.0–0.2)

## 2023-03-07 LAB — GLUCOSE, CAPILLARY
Glucose-Capillary: 129 mg/dL — ABNORMAL HIGH (ref 70–99)
Glucose-Capillary: 129 mg/dL — ABNORMAL HIGH (ref 70–99)
Glucose-Capillary: 111 mg/dL — ABNORMAL HIGH (ref 70–99)
Glucose-Capillary: 130 mg/dL — ABNORMAL HIGH (ref 70–99)
Glucose-Capillary: 113 mg/dL — ABNORMAL HIGH (ref 70–99)
Glucose-Capillary: 100 mg/dL — ABNORMAL HIGH (ref 70–99)

## 2023-03-07 LAB — BASIC METABOLIC PANEL
Anion gap: 11 (ref 5–15)
BUN: 79 mg/dL — ABNORMAL HIGH (ref 8–23)
CO2: 16 mmol/L — ABNORMAL LOW (ref 22–32)
Calcium: 8.7 mg/dL — ABNORMAL LOW (ref 8.9–10.3)
Chloride: 111 mmol/L (ref 98–111)
Creatinine, Ser: 1.42 mg/dL — ABNORMAL HIGH (ref 0.61–1.24)
GFR, Estimated: 54 mL/min — ABNORMAL LOW (ref >60)
Glucose, Bld: 124 mg/dL — ABNORMAL HIGH (ref 70–99)
Potassium: 5 mmol/L (ref 3.5–5.1)
Sodium: 138 mmol/L (ref 135–145)

## 2023-03-07 MED ORDER — ALBUMIN HUMAN 5 % IV SOLN
25.0000 g | Freq: Once | INTRAVENOUS | Status: AC
  Administered 2023-03-07: 25 g via INTRAVENOUS
  Filled 2023-03-07: qty 500

## 2023-03-07 MED ORDER — ZINC CHLORIDE 1 MG/ML IV SOLN
INTRAVENOUS | Status: AC
  Filled 2023-03-07: qty 1000.9

## 2023-03-07 NOTE — Progress Notes (Addendum)
Tube feeds stopped per Dr. Freida Busman.    Holding free water flushes per Dr. Freida Busman.

## 2023-03-07 NOTE — Progress Notes (Signed)
Occupational Therapy Treatment Patient Details Name: Bradley Dominguez MRN: [id number] DOB: Apr 08, 1956 Today's Date: 03/07/2023   History of present illness 67 yo M HF, chronic partial small bowel obstruction and multiple previous abd surgeries  who was admitted  4/8 from home w n/v/bloating. Underwent EGD 4/11 -- concerning for duodenal obstruction. Developed hemorrhagic shock 4/15 in the setting of hematemesis / hematochezia, decompensated requiring, intubation, embolization of jejunal arcade branch. Extubated 01/22/23. Underwent ex lap adhesiolysis, duodenojejunal bypass, duodenostomy tube placement, T tube placement in common bile duct 4/22, transferred to [facility] 4/23 with hemorrhagic shock, return to OR 4/23, 4/24, 4/25 and finally 4/27 with placement of J tube, end ileostomy, abdominal closure.  Extubated 4/30.  NGT placed 5/14 due to possible ileus.   OT comments  Patient seen for OT/PT session, focused on tilt bed for upright positioning tolerance and strength.  His BP remains soft but stable during tilting to 25 degrees. Poor tolerance for activity and requires maximal rest breaks. He reports some lightheadedness when asking at 25 degrees, but BP stable- see below.  He requires total assist for all self care and mobility in bed.  Will follow acutely.   BP: On Entry 82/48 ((60)  Tilt 20 95/48 (62) Tilt 25 initially 94/58 (69) Tilt 25 after a few minutes 82/57 (65)  Supine initially 89/52 (62) Post 93/51 (62)     Recommendations for follow up therapy are one component of a multi-disciplinary discharge planning process, led by the attending physician.  Recommendations may be updated based on patient status, additional functional criteria and insurance authorization.    Assistance Recommended at Discharge Frequent or constant Supervision/Assistance  Patient can return home with the following  Assistance with cooking/housework;Assistance with feeding;Direct supervision/assist for medications  management;Direct supervision/assist for financial management;Assist for transportation;Help with stairs or ramp for entrance;Two people to help with walking and/or transfers;Two people to help with bathing/dressing/bathroom   Equipment Recommendations  Other (comment) (TBD)    Recommendations for Other Services      Precautions / Restrictions Precautions Precautions: Fall Precaution Comments: multiple lines, JP drain, biliary tube, ileostomy; abdominal incision; NGT to suction Restrictions Weight Bearing Restrictions: No       Mobility Bed Mobility Overal bed mobility: Needs Assistance Bed Mobility: Rolling Rolling: Total assist, +2 for physical assistance, +2 for safety/equipment         General bed mobility comments: total +2 to roll in bed and reposition, used tilt bed today    Transfers                         Balance                                           ADL either performed or assessed with clinical judgement   ADL Overall ADL's : Needs assistance/impaired Eating/Feeding: NPO   Grooming: Total assistance;Wash/dry face               Lower Body Dressing: Total assistance;+2 for physical assistance;+2 for safety/equipment;Bed level               Functional mobility during ADLs: Total assistance;+2 for physical assistance;+2 for safety/equipment General ADL Comments: rolling in bed and using tilt bed    Extremity/Trunk Assessment              Vision  Perception     Praxis      Cognition Arousal/Alertness: Lethargic Behavior During Therapy: Flat affect Overall Cognitive Status: Impaired/Different from baseline Area of Impairment: Attention, Following commands, Problem solving                   Current Attention Level: Focused   Following Commands: Follows one step commands with increased time, Follows one step commands inconsistently   Awareness: Intellectual Problem Solving: Slow  processing, Decreased initiation, Difficulty sequencing, Requires verbal cues General Comments: pt able to verbalize minimally, limited ability to follow commands        Exercises      Shoulder Instructions       General Comments BP monitored during session, see clinical impression    Pertinent Vitals/ Pain       Pain Assessment Pain Assessment: Faces Faces Pain Scale: Hurts even more Pain Location: generalized Pain Descriptors / Indicators: Discomfort, Grimacing Pain Intervention(s): Monitored during session, Repositioned, Limited activity within patient's tolerance  Home Living                                          Prior Functioning/Environment              Frequency  Min 1X/week        Progress Toward Goals  OT Goals(current goals can now be found in the care plan section)  Progress towards OT goals: OT to reassess next treatment  Acute Rehab OT Goals Patient Stated Goal: none stated per pt OT Goal Formulation: Patient unable to participate in goal setting Time For Goal Achievement: 03/17/23 Potential to Achieve Goals: Fair  Plan Discharge plan remains appropriate;Frequency remains appropriate    Co-evaluation    PT/OT/SLP Co-Evaluation/Treatment: Yes Reason for Co-Treatment: Complexity of the patient's impairments (multi-system involvement);For patient/therapist safety;To address functional/ADL transfers   OT goals addressed during session: ADL's and self-care;Strengthening/ROM      AM-PAC OT "6 Clicks" Daily Activity     Outcome Measure   Help from another person eating meals?: Total (NPO) Help from another person taking care of personal grooming?: Total Help from another person toileting, which includes using toliet, bedpan, or urinal?: Total Help from another person bathing (including washing, rinsing, drying)?: Total Help from another person to put on and taking off regular upper body clothing?: Total Help from another  person to put on and taking off regular lower body clothing?: Total 6 Click Score: 6    End of Session    OT Visit Diagnosis: Unsteadiness on feet (R26.81);Other abnormalities of gait and mobility (R26.89);Muscle weakness (generalized) (M62.81);Other symptoms and signs involving cognitive function;Pain Pain - Right/Left: Right Pain - part of body:  (generalized)   Activity Tolerance Patient limited by fatigue   Patient Left in bed;with call bell/phone within reach;with nursing/sitter in room;with family/visitor present;with bed alarm set   Nurse Communication Mobility status        Time: 1008-1050 OT Time Calculation (min): 42 min  Charges: OT General Charges $OT Visit: 1 Visit OT Treatments $Self Care/Home Management : 8-22 mins  Barry Brunner, OT Acute Rehabilitation Services Office [phone]   Chancy Milroy 03/07/2023, 1:45 PM

## 2023-03-07 NOTE — Progress Notes (Signed)
PHARMACY - TOTAL PARENTERAL NUTRITION CONSULT NOTE  Indication: Prolonged ileus, intolerance to enteral feeding  Patient Measurements: Height: 6\' 1"  (185.4 cm) Weight: 65.3 kg (143 lb 15.4 oz) IBW/kg (Calculated) : 79.9 TPN AdjBW (KG): 65.5 Body mass index is 18.99 kg/m.  Assessment: 67 yo M with hx intestinal surgery and subsequent SBO requiring additional resection, admitted 01/25/2023 with recurrent issues with SBO at the duodenum. Now s/p duodenal bypass complicated due to adhesions and resulted in small common bile duct injury requiring T-tube placement and external duodenostomy. Patient with massive bleed/hemorrhagic shock, s/p MTP. TPN initiated 4/9-5/6, then transitioned to TF. TF then held 5/14 given small bowel distension secondary to suspected ileus and likely dysmotility. Pharmacy consulted to resume TPN 5/16.  Glucose / Insulin: no hx DM - CBGs < 180.  No SSI. Electrolytes: K 5.0 (goal >/= 4 for ileus), Na 138 (none in TPN, FW 30mL BID for Jtube patency), low CO2, Phos elevated at 5.1 (none in TPN), others WNL Renal: SCr 1.42 (BL ~0.7-0.9), BUN 79 Hepatic: LFTs elevated and stable, tbili up to 29.3, TG WNL, albumin 2.2 Intake / Output; MIVF: UOP 1.0 ml/kg/hr, NGT , L JP Drain 385 mL; RUQ jejunostomy 1325  mL;  bicarb @ 75 ml/hr GI Imaging:  5/23 CT abd pelvis - post-surgical changes, questionable pneumonia  5/14 CT abd pelvis - feculent material throughout the SB/abdomen, suspicious for SB obstruction, bowel perforation/dehiscence, interval development of gas-containing fluid collection in R paracolic gutter 5/3 CT abdomen: bowel edema, no cause identified for T-bili elevation  5/3 cholangiogram: mild dilation of bilary tract   4/4 UGI: Findings suggestive of chronic pSBO or ileus 4/9 CT: Ileus vs high grade partial SBO GI Surgeries / Procedures: 4/22 - Ex lap, adhesiolysis, duodenojejunal bypass, duodenostomy tube placement, T tube placement in common bile duct. 4/23 -  Takeback for bleeding with hemorrhagic shock, resection of ischemic transverse colon, left in discontinuity with Abthera 4/24 - Takeback for bleeding, abdominal washout, ileocecectomy, pack placement, placement of Abthera 4/25 - Takeback, resection of 10cm necrotic distal ileum, washout, removal of packs, placement of Abthera 4/27 - Takeback, placement of J tube, end ileostomy, abdominal closure  Central access: triple lumen PICC placed 01/23/23 TPN start date: 01/14/23-02/10/23, 5/16>>  Nutritional Goals: Goal TPN rate is 95 mL/hr (provides 134 g of protein and 2307 kcals per day)  RD Estimated Needs Total Energy Estimated Needs: 2300 - 2600 - discussed with Pharmacy try to meet higher end of needs Total Protein Estimated Needs: 130-145 grams Total Fluid Estimated Needs: 2.1L/day  Current Nutrition:  TPN  Plan:  Discussed with CCS, Renal, and RD on 5/23. Reducing protein amount in an attempt to limit BUN increase. Aware of high lipid content, 30% of total calories but over 1 g/kg/day.  CHO and ILE content maximized to meet caloric needs.    Stop TF today d/t abdominal distention and NG output with c/f ileus   Electrolytes in TPN: Na 38mEq/L since 5/17, K 40 mEq/L, Ca 53mEq/L, Mag 5 mEq/L, Phos 15mmol/L since 5/16, max acetate.  Standard MVI to TPN.  Remove standard trace elements, add back zinc 5mg  Continue to hold chromium, and selenium.  Na-bicarb at 75 ml/hr per MD for persistent acidosis Monitor standard TPN labs on Mon/Thurs plus daily BMET daily for BUN/Scr monitoring   Calton Dach, PharmD Clinical Pharmacist 03/07/2023 7:31 AM

## 2023-03-07 NOTE — Progress Notes (Signed)
Physical Therapy Treatment Patient Details Name: Bradley Dominguez MRN: [id number] DOB: [birth year] Today's Date: 03/07/2023   History of Present Illness 67 yo M HF, chronic partial small bowel obstruction and multiple previous abd surgeries  who was admitted  4/8 from home w n/v/bloating. Underwent EGD 4/11 -- concerning for duodenal obstruction. Developed hemorrhagic shock 4/15 in the setting of hematemesis / hematochezia, decompensated requiring, intubation, embolization of jejunal arcade branch. Extubated 01/22/23. Underwent ex lap adhesiolysis, duodenojejunal bypass, duodenostomy tube placement, T tube placement in common bile duct 4/22, transferred to [facility] 4/23 with hemorrhagic shock, return to OR 4/23, 4/24, 4/25 and finally 4/27 with placement of J tube, end ileostomy, abdominal closure.  Extubated 4/30.  NGT placed 5/14 due to possible ileus.    PT Comments    Patient seen with OT for initial tilt session in tilt bed.  Able to tolerate up to 25 degrees with mild initial drop in BP, then stabilized.  Knees flexed in straps but encouraged extension of hips and knees for LE strength while tilted.  Patient with significant NGT output during tilting session (RN aware) but seemed much more comfortable end of session as wife reports restless all night due to abdominal discomfort and dysmotility.  PT will continue to follow.    Recommendations for follow up therapy are one component of a multi-disciplinary discharge planning process, led by the attending physician.  Recommendations may be updated based on patient status, additional functional criteria and insurance authorization.  Follow Up Recommendations       Assistance Recommended at Discharge Frequent or constant Supervision/Assistance  Patient can return home with the following Two people to help with walking and/or transfers;Assistance with cooking/housework;Direct supervision/assist for medications management;Assist for transportation;Help with  stairs or ramp for entrance;Two people to help with bathing/dressing/bathroom   Equipment Recommendations  Other (comment) (TBA)    Recommendations for Other Services       Precautions / Restrictions Precautions Precautions: Fall Precaution Comments: multiple lines, JP drain, biliary tube, ileostomy; abdominal incision; NGT to suction Restrictions Weight Bearing Restrictions: No     Mobility  Bed Mobility Overal bed mobility: Needs Assistance Bed Mobility: Rolling Rolling: Total assist, +2 for physical assistance, +2 for safety/equipment         General bed mobility comments: total +2 to roll in bed and reposition, used tilt bed today    Transfers                        Ambulation/Gait                   Stairs             Wheelchair Mobility    Modified Rankin (Stroke Patients Only)       Balance                                            Cognition Arousal/Alertness: Lethargic Behavior During Therapy: Flat affect Overall Cognitive Status: Impaired/Different from baseline Area of Impairment: Attention, Following commands, Problem solving                   Current Attention Level: Focused   Following Commands: Follows one step commands with increased time, Follows one step commands inconsistently   Awareness: Intellectual Problem Solving: Slow processing, Decreased initiation, Difficulty sequencing, Requires verbal cues General Comments: pt  able to verbalize minimally, limited ability to follow commands        Exercises Other Exercises Other Exercises: encouraged knee and hip extension while tilted at 25 degrees in tilt bed.    General Comments General comments (skin integrity, edema, etc.): BP monitored during tilt up to 25 degrees today.  03/07/2023   Tilt Bed Documentation   Time Tilt Angle BP HR RR O2 Sat FiO2 Total Mins Tilted: Pt response*:  1000am 0 82/48        1030am 20 95/48     3  Cooperative  1035am 25 82/57 89/52  33   5 Fatigued  1040am 0 93/51      Relaxed  *Cooperative, anxious, restless, impulsive, flat, other (comment)      Pertinent Vitals/Pain Pain Assessment Pain Assessment: Faces Faces Pain Scale: Hurts even more Pain Location: generalized Pain Descriptors / Indicators: Discomfort, Grimacing Pain Intervention(s): Monitored during session, Limited activity within patient's tolerance    Home Living                          Prior Function            PT Goals (current goals can now be found in the care plan section) Progress towards PT goals: Progressing toward goals    Frequency    Min 3X/week      PT Plan Current plan remains appropriate    Co-evaluation PT/OT/SLP Co-Evaluation/Treatment: Yes Reason for Co-Treatment: Complexity of the patient's impairments (multi-system involvement);For patient/therapist safety;To address functional/ADL transfers PT goals addressed during session: Mobility/safety with mobility;Strengthening/ROM OT goals addressed during session: ADL's and self-care;Strengthening/ROM      AM-PAC PT "6 Clicks" Mobility   Outcome Measure  Help needed turning from your back to your side while in a flat bed without using bedrails?: Total Help needed moving from lying on your back to sitting on the side of a flat bed without using bedrails?: Total Help needed moving to and from a bed to a chair (including a wheelchair)?: Total Help needed standing up from a chair using your arms (e.g., wheelchair or bedside chair)?: Total Help needed to walk in hospital room?: Total Help needed climbing 3-5 steps with a railing? : Total 6 Click Score: 6    End of Session   Activity Tolerance: Patient limited by fatigue Patient left: in bed;with call bell/phone within reach;with family/visitor present   PT Visit Diagnosis: Other abnormalities of gait and mobility (R26.89);Muscle weakness (generalized) (M62.81);Other  symptoms and signs involving the nervous system (R29.898)     Time: 1008-1050 PT Time Calculation (min) (ACUTE ONLY): 42 min  Charges:  $Therapeutic Activity: 8-22 mins                     Sheran Lawless, PT Acute Rehabilitation Services [phone] 03/07/2023    [name] 03/07/2023, 4:45 PM

## 2023-03-07 NOTE — Progress Notes (Signed)
General Surgery Follow Up Note  Subjective:    Overnight Issues: Increased BUN/Cr. D tube and NG output also increased. Patient seems more restless and uncomfortable.  Objective:  Vital signs for last 24 hours: Temp:  [97.5 F (36.4 C)-99.1 F (37.3 C)] 97.8 F (36.6 C) (05/31 0400) Pulse Rate:  [78-180] 92 (05/31 0800) Resp:  [16-36] 24 (05/31 0800) BP: (75-108)/(36-71) 95/50 (05/31 0800) SpO2:  [90 %-100 %] 95 % (05/31 0800) Weight:  [65.3 kg] 65.3 kg (05/31 0500)  Hemodynamic parameters for last 24 hours:    Intake/Output from previous day: 05/30 0701 - 05/31 0700 In: 7253.5 [I.V.:4938.2; NG/GT:1650; IV Piggyback:665.4] Out: 5695 [Urine:1535; Emesis/NG output:2350; Drains:1710; Stool:100]  Intake/Output this shift: No intake/output data recorded.  Vent settings for last 24 hours:    Physical Exam:  Gen: NAD, appears frail and ill Neuro: lethargic HEENT: scleral icterus; NG in place draining gastric contents, slight blood-tinge CV: RRR Pulm: nonlabored respirations on room air Abd: increased distension, midline wound is granulating with minimal fibrinous exudate. LUQ JP with thin brown/yellow fluid. Duodenostomy to gravity with brown drainage. Ileostomy productive of liquid stool, nonbloody. Extr: wwp, no edema Skin: jaundiced GU: foley in place, dark urine  Results for orders placed or performed during the hospital encounter of 01/07/2023 (from the past 24 hour(s))  Glucose, capillary     Status: Abnormal   Collection Time: 03/06/23 11:50 AM  Result Value Ref Range   Glucose-Capillary 120 (H) 70 - 99 mg/dL  Glucose, capillary     Status: Abnormal   Collection Time: 03/06/23  3:57 PM  Result Value Ref Range   Glucose-Capillary 126 (H) 70 - 99 mg/dL  Glucose, capillary     Status: Abnormal   Collection Time: 03/06/23  7:37 PM  Result Value Ref Range   Glucose-Capillary 124 (H) 70 - 99 mg/dL  Glucose, capillary     Status: Abnormal   Collection Time: 03/06/23  11:20 PM  Result Value Ref Range   Glucose-Capillary 129 (H) 70 - 99 mg/dL  Glucose, capillary     Status: Abnormal   Collection Time: 03/07/23  3:32 AM  Result Value Ref Range   Glucose-Capillary 129 (H) 70 - 99 mg/dL  CBC     Status: Abnormal   Collection Time: 03/07/23  4:48 AM  Result Value Ref Range   WBC 17.4 (H) 4.0 - 10.5 K/uL   RBC 2.86 (L) 4.22 - 5.81 MIL/uL   Hemoglobin 8.8 (L) 13.0 - 17.0 g/dL   HCT 95.6 (L) 21.3 - 08.6 %   MCV 94.1 80.0 - 100.0 fL   MCH 30.8 26.0 - 34.0 pg   MCHC 32.7 30.0 - 36.0 g/dL   RDW 57.8 (H) 46.9 - 62.9 %   Platelets 157 150 - 400 K/uL   nRBC 0.0 0.0 - 0.2 %  Basic metabolic panel     Status: Abnormal   Collection Time: 03/07/23  4:48 AM  Result Value Ref Range   Sodium 138 135 - 145 mmol/L   Potassium 5.0 3.5 - 5.1 mmol/L   Chloride 111 98 - 111 mmol/L   CO2 16 (L) 22 - 32 mmol/L   Glucose, Bld 124 (H) 70 - 99 mg/dL   BUN 79 (H) 8 - 23 mg/dL   Creatinine, Ser 5.28 (H) 0.61 - 1.24 mg/dL   Calcium 8.7 (L) 8.9 - 10.3 mg/dL   GFR, Estimated 54 (L) >60 mL/min   Anion gap 11 5 - 15    Assessment &  Plan: The plan of care was discussed with the bedside nurse for the day, who is in agreement with this plan and no additional concerns were raised.   Present on Admission:  Bowel obstruction (HCC)  Protein-calorie malnutrition, severe (HCC)  Duodenal obstruction    LOS: 53 days   Additional comments:I reviewed the patient's new clinical lab test results.   and I reviewed the patients new imaging test results.    67 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  4/22 - Ex lap, adhesiolysis, duodenojejunal bypass, duodenostomy tube placement, T tube placement in common bile duct. 4/23 - Takeback for bleeding with hemorrhagic shock, resection of ischemic transverse colon, left in discontinuity with Abthera 4/24 - Takeback for bleeding, abdominal washout, ileocecectomy, pack placement, placement of Abthera 4/25 - Takeback, resection of 10cm necrotic  distal ileum, washout, removal of packs, placement of Abthera 4/27 - Takeback, placement of J tube, end ileostomy, abdominal closure   - Hyperbilirubinemia: Tube cholangiogram on 5/3 showed patency of the biliary tree with no obstruction. Likely multifactorial due to massive transfusion with hemolysis and shock liver. Trend LFTs twice weekly. Tbili 29 yesterday. - Keep D tube to gravity drainage. T tube dislodged and removed on 5/30. No frankly bilious output from JP drain, continue to monitor. - Pain: low-dose fentanyl patch, prn dilaudid. - Wound care: Dry gauze dressing to midline wound, granulating. - ID: None, previously completed course of ciprofloxacin for Pseudomonas pneumonia. No undrained intraabdominal collections on most recent CT. - FEN: NPO. Patient has increased abdominal distension and discomfort with feeds, and increased output from NG and duodenostomy tubes, consistent with ongoing ileus and likely dysmotility. KUB this morning shows bowel distension. Stop tube feeds. Continue full strength TPN. Increased BUN/Cr likely from GI losses. Give albumin bolus this morning. Continue bicarb infusion at 75 on top of TPN, acidosis improved with bicarb. - Hypotension: Patient has chronic low BP. Volume depletion from GI losses is also contributing. Continue midodrine 5mg  TID. Wean levophed for MAP>60. - Urinary retention: Foley replaced on 5/20 for second time for urinary retention. Keep in place. - WOC following for ostomy care and teaching - PT/OT following. Patient is profoundly deconditioned, has made minimal PT progress recently. Transitioned to tilt bed per PT recs to help increase mobility. - VTE: SCDs, lovenox on hold given recent GI bleeding.  - Dispo: ICU  I had a discussion with patient's wife this morning regarding long-term goals. He has made minimal progress recently, and is profoundly deconditioned. Participation with PT has been very limited. Patient has not been able to  tolerate enteral feeds with multiple attempts, and will likely be TPN-dependent for many months. We continue to have issues with azotemia and likely biliary stasis, both of which can be exacerbated by TPN. The longer he is hospitalized with persistent weakness and deconditioning, the less likely it is that he will recover meaningful quality of life. I brought up consulting palliative care with patient's wife, to at least assist with pain management and help determine long-term goals for the patient. I emphasized this does not mean we have to stop any medical interventions. Irving Burton is not yet ready to have palliative involved. For now, will hold feeds. This will hopefully improve abdominal distension and discomfort in the coming days. If NG and D tube output decrease, hopefully a D tube capping trial can be initiated in the next 1-2 weeks.  Sophronia Simas, MD [facility] Surgery General, Hepatobiliary and Pancreatic Surgery 03/07/23 8:36 AM

## 2023-03-08 LAB — BASIC METABOLIC PANEL
Anion gap: 10 (ref 5–15)
BUN: 93 mg/dL — ABNORMAL HIGH (ref 8–23)
CO2: 22 mmol/L (ref 22–32)
Calcium: 8.2 mg/dL — ABNORMAL LOW (ref 8.9–10.3)
Chloride: 102 mmol/L (ref 98–111)
Creatinine, Ser: 1.57 mg/dL — ABNORMAL HIGH (ref 0.61–1.24)
GFR, Estimated: 48 mL/min — ABNORMAL LOW (ref >60)
Glucose, Bld: 117 mg/dL — ABNORMAL HIGH (ref 70–99)
Potassium: 5.2 mmol/L — ABNORMAL HIGH (ref 3.5–5.1)
Sodium: 134 mmol/L — ABNORMAL LOW (ref 135–145)

## 2023-03-08 LAB — CBC
HCT: 24.8 % — ABNORMAL LOW (ref 39.0–52.0)
Hemoglobin: 8 g/dL — ABNORMAL LOW (ref 13.0–17.0)
MCH: 29.6 pg (ref 26.0–34.0)
MCHC: 32.3 g/dL (ref 30.0–36.0)
MCV: 91.9 fL (ref 80.0–100.0)
Platelets: 147 10*3/uL — ABNORMAL LOW (ref 150–400)
RBC: 2.7 MIL/uL — ABNORMAL LOW (ref 4.22–5.81)
RDW: 26.8 % — ABNORMAL HIGH (ref 11.5–15.5)
WBC: 18 10*3/uL — ABNORMAL HIGH (ref 4.0–10.5)
nRBC: 0 % (ref 0.0–0.2)

## 2023-03-08 LAB — GLUCOSE, CAPILLARY
Glucose-Capillary: 119 mg/dL — ABNORMAL HIGH (ref 70–99)
Glucose-Capillary: 108 mg/dL — ABNORMAL HIGH (ref 70–99)
Glucose-Capillary: 101 mg/dL — ABNORMAL HIGH (ref 70–99)
Glucose-Capillary: 106 mg/dL — ABNORMAL HIGH (ref 70–99)
Glucose-Capillary: 114 mg/dL — ABNORMAL HIGH (ref 70–99)
Glucose-Capillary: 108 mg/dL — ABNORMAL HIGH (ref 70–99)

## 2023-03-08 LAB — MAGNESIUM: Magnesium: 2.3 mg/dL (ref 1.7–2.4)

## 2023-03-08 MED ORDER — ALBUMIN HUMAN 5 % IV SOLN
25.0000 g | Freq: Once | INTRAVENOUS | Status: AC
  Administered 2023-03-08: 25 g via INTRAVENOUS
  Filled 2023-03-08: qty 500

## 2023-03-08 MED ORDER — ZINC CHLORIDE 1 MG/ML IV SOLN
INTRAVENOUS | Status: AC
  Filled 2023-03-08: qty 1000.9

## 2023-03-08 NOTE — Progress Notes (Signed)
PHARMACY - TOTAL PARENTERAL NUTRITION CONSULT NOTE  Indication: Prolonged ileus, intolerance to enteral feeding  Patient Measurements: Height: 6\' 1"  (185.4 cm) Weight: 66.8 kg (147 lb 4.3 oz) IBW/kg (Calculated) : 79.9 TPN AdjBW (KG): 65.5 Body mass index is 19.43 kg/m.  Assessment: 67 yo M with hx intestinal surgery and subsequent SBO requiring additional resection, admitted 02/02/2023 with recurrent issues with SBO at the duodenum. Now s/p duodenal bypass complicated due to adhesions and resulted in small common bile duct injury requiring T-tube placement and external duodenostomy. Patient with massive bleed/hemorrhagic shock, s/p MTP. TPN initiated 4/9-5/6, then transitioned to TF. TF then held 5/14 given small bowel distension secondary to suspected ileus and likely dysmotility. Pharmacy consulted to resume TPN 5/16.  Patient was re-initiated on tube feeds 5/24 - stopped on 5/31 due to abdominal distension and NG output with c/f ileus  Glucose / Insulin: no hx DM - CBGs < 180.  No SSI. Electrolytes: K elevated at 5.2 (goal >/= 4 for ileus), Na slightly low at 134 (none in TPN), last Phos was elevated at 5.1 (none in TPN), others WNL Renal: SCr 1.57 (BL ~0.7-0.9), BUN 93 - worsening today  Hepatic: 5/30 LFTs elevated and stable, tbili up to 29.3, TG WNL, albumin 2.2 Intake / Output; MIVF: UOP 1400 mL, NGT (improved), L JP Drain ; 110 mL stool output; bicarb @ 75 ml/hr GI Imaging:  5/23 CT abd pelvis - post-surgical changes, questionable pneumonia  5/14 CT abd pelvis - feculent material throughout the SB/abdomen, suspicious for SB obstruction, bowel perforation/dehiscence, interval development of gas-containing fluid collection in R paracolic gutter 5/3 CT abdomen: bowel edema, no cause identified for T-bili elevation  5/3 cholangiogram: mild dilation of bilary tract   4/4 UGI: Findings suggestive of chronic pSBO or ileus 4/9 CT: Ileus vs high grade partial SBO 5/31 Abd Xray:  mildly dilated small bowel loop GI Surgeries / Procedures: 4/22 - Ex lap, adhesiolysis, duodenojejunal bypass, duodenostomy tube placement, T tube placement in common bile duct. 4/23 - Takeback for bleeding with hemorrhagic shock, resection of ischemic transverse colon, left in discontinuity with Abthera 4/24 - Takeback for bleeding, abdominal washout, ileocecectomy, pack placement, placement of Abthera 4/25 - Takeback, resection of 10cm necrotic distal ileum, washout, removal of packs, placement of Abthera 4/27 - Takeback, placement of J tube, end ileostomy, abdominal closure  Central access: triple lumen PICC placed 01/23/23 TPN start date: 01/14/23-02/10/23, 5/16>>  Nutritional Goals: Goal TPN rate is 95 mL/hr (provides 134 g of protein and 2307 kcals per day)  RD Estimated Needs Total Energy Estimated Needs: 2300 - 2600 - discussed with Pharmacy try to meet higher end of needs Total Protein Estimated Needs: 130-145 grams Total Fluid Estimated Needs: 2.1L/day  Current Nutrition:  TPN  Plan:  Discussed with CCS, Renal, and RD on 5/23. Reducing protein amount in an attempt to limit BUN increase. Aware of high lipid content, 30% of total calories but over 1 g/kg/day.  CHO and ILE content maximized to meet caloric needs.    Electrolytes in TPN: add back Na 25 mEq/L, K to 0 mEq/L given incr in K and worsening renal function, Ca 58mEq/L, Mag 5 mEq/L, Phos 60mmol/L since 5/16, max acetate.  Standard MVI to TPN.  Remove standard trace elements, add back zinc 5mg  Continue to hold chromium and selenium given renal dysfunction  Na-bicarb at 75 ml/hr per MD for persistent acidosis Monitor standard TPN labs on Mon/Thurs plus daily BMET daily for BUN/Scr and e-lyte monitoring  F/u ability to resume tube feeds  Rexford Maus, PharmD, BCPS 03/08/2023 6:51 AM

## 2023-03-08 NOTE — Progress Notes (Signed)
Trauma Event Note    Pt with continued hypotension following albumin infusion. BP currently 88/44 MAP. Levophed infusing at 1mcg/min, maxed per order.  Dr. Andrey Campanile notified via text. No orders given.   Last imported Vital Signs BP (!) 84/49   Pulse 82   Temp 97.6 F (36.4 C) (Oral)   Resp (!) 23   Ht 6\' 1"  (1.854 m)   Wt 147 lb 4.3 oz (66.8 kg)   SpO2 100%   BMI 19.43 kg/m   Trending CBC Recent Labs    03/06/23 0600 03/07/23 0448 03/08/23 0839  WBC 14.2* 17.4* 18.0*  HGB 8.6* 8.8* 8.0*  HCT 26.8* 26.9* 24.8*  PLT 155 157 147*    Trending Coag's No results for input(s): "APTT", "INR" in the last 72 hours.  Trending BMET Recent Labs    03/06/23 0600 03/07/23 0448 03/08/23 0839  NA 142 138 134*  K 4.5 5.0 5.2*  CL 114* 111 102  CO2 15* 16* 22  BUN 66* 79* 93*  CREATININE 1.29* 1.42* 1.57*  GLUCOSE 116* 124* 117*      Staria Birkhead O Raea Magallon  Trauma Response RN  Please call TRN at [phone] for further assistance.

## 2023-03-08 NOTE — Progress Notes (Signed)
General Surgery Follow Up Note  Subjective:    Overnight Issues: Patient is resting comfortably.  Wife and son at bedside.  Objective:  Vital signs for last 24 hours: Temp:  [97.5 F (36.4 C)-98.4 F (36.9 C)] 97.6 F (36.4 C) (06/01 0400) Pulse Rate:  [85-96] 94 (06/01 0654) Resp:  [19-26] 24 (06/01 0654) BP: (76-104)/(40-53) 93/49 (06/01 0600) SpO2:  [100 %] 100 % (06/01 0654) Weight:  [66.8 kg] 66.8 kg (06/01 0500)  Hemodynamic parameters for last 24 hours:    Intake/Output from previous day: 05/31 0701 - 06/01 0700 In: 4586.3 [I.V.:3708.5; NG/GT:600; IV Piggyback:277.8] Out: 3495 [Urine:1400; Emesis/NG output:1500; Drains:485; Stool:110]  Intake/Output this shift: No intake/output data recorded.  Vent settings for last 24 hours:    Physical Exam:  Gen: NAD, appears frail and ill Neuro: lethargic HEENT: scleral icterus; NG in place draining gastric contents, slight blood-tinge CV: RRR Pulm: nonlabored respirations on room air Abd: increased distension, midline wound is granulating with minimal fibrinous exudate. LUQ JP with thin brown/yellow fluid. Duodenostomy to gravity with brown drainage. Ileostomy productive of liquid stool, nonbloody. Extr: wwp, no edema Skin: jaundiced GU: foley in place, dark urine  Results for orders placed or performed during the hospital encounter of 01/10/2023 (from the past 24 hour(s))  Glucose, capillary     Status: Abnormal   Collection Time: 03/07/23 12:32 PM  Result Value Ref Range   Glucose-Capillary 111 (H) 70 - 99 mg/dL  Glucose, capillary     Status: Abnormal   Collection Time: 03/07/23  4:23 PM  Result Value Ref Range   Glucose-Capillary 130 (H) 70 - 99 mg/dL  Glucose, capillary     Status: Abnormal   Collection Time: 03/07/23  7:54 PM  Result Value Ref Range   Glucose-Capillary 113 (H) 70 - 99 mg/dL  Glucose, capillary     Status: Abnormal   Collection Time: 03/07/23 11:15 PM  Result Value Ref Range    Glucose-Capillary 100 (H) 70 - 99 mg/dL  Glucose, capillary     Status: Abnormal   Collection Time: 03/08/23  3:23 AM  Result Value Ref Range   Glucose-Capillary 119 (H) 70 - 99 mg/dL  Glucose, capillary     Status: Abnormal   Collection Time: 03/08/23  8:33 AM  Result Value Ref Range   Glucose-Capillary 108 (H) 70 - 99 mg/dL    Assessment & Plan: The plan of care was discussed with the bedside nurse for the day, who is in agreement with this plan and no additional concerns were raised.   Present on Admission:  Bowel obstruction (HCC)  Protein-calorie malnutrition, severe (HCC)  Duodenal obstruction    LOS: 54 days   Additional comments:I reviewed the patient's new clinical lab test results.   and I reviewed the patients new imaging test results.    67 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  4/22 - Ex lap, adhesiolysis, duodenojejunal bypass, duodenostomy tube placement, T tube placement in common bile duct. 4/23 - Takeback for bleeding with hemorrhagic shock, resection of ischemic transverse colon, left in discontinuity with Abthera 4/24 - Takeback for bleeding, abdominal washout, ileocecectomy, pack placement, placement of Abthera 4/25 - Takeback, resection of 10cm necrotic distal ileum, washout, removal of packs, placement of Abthera 4/27 - Takeback, placement of J tube, end ileostomy, abdominal closure   - Hyperbilirubinemia: Tube cholangiogram on 5/3 showed patency of the biliary tree with no obstruction. Likely multifactorial due to massive transfusion with hemolysis and shock liver. Trend LFTs twice weekly.  Tbili 29 yesterday. - Keep D tube to gravity drainage. T tube dislodged and removed on 5/30. No frankly bilious output from JP drain, continue to monitor. - Pain: low-dose fentanyl patch, prn dilaudid. - Wound care: Dry gauze dressing to midline wound, granulating. - ID: None, previously completed course of ciprofloxacin for Pseudomonas pneumonia. No undrained intraabdominal  collections on most recent CT. - FEN: NPO. Patient has increased abdominal distension and discomfort with feeds, and increased output from NG and duodenostomy tubes, consistent with ongoing ileus and likely dysmotility. KUB this morning shows bowel distension. Stop tube feeds. Continue full strength TPN. Increased BUN/Cr likely from GI losses. Give albumin bolus this morning. Continue bicarb infusion at 75 on top of TPN, acidosis improved with bicarb. - Hypotension: Patient has chronic low BP. Volume depletion from GI losses is also contributing. Continue midodrine 5mg  TID. Wean levophed for MAP>60. - Urinary retention: Foley replaced on 5/20 for second time for urinary retention. Keep in place. - WOC following for ostomy care and teaching - PT/OT following. Patient is profoundly deconditioned, has made minimal PT progress recently. Transitioned to tilt bed per PT recs to help increase mobility. - VTE: SCDs, lovenox on hold given recent GI bleeding.  - Dispo: ICU  Per Dr. Eliot Ford note yesterday-"I had a discussion with patient's wife this morning regarding long-term goals. He has made minimal progress recently, and is profoundly deconditioned. Participation with PT has been very limited. Patient has not been able to tolerate enteral feeds with multiple attempts, and will likely be TPN-dependent for many months. We continue to have issues with azotemia and likely biliary stasis, both of which can be exacerbated by TPN. The longer he is hospitalized with persistent weakness and deconditioning, the less likely it is that he will recover meaningful quality of life. I brought up consulting palliative care with patient's wife, to at least assist with pain management and help determine long-term goals for the patient. I emphasized this does not mean we have to stop any medical interventions. Irving Burton is not yet ready to have palliative involved. For now, will hold feeds. This will hopefully improve abdominal  distension and discomfort in the coming days. If NG and D tube output decrease, hopefully a D tube capping trial can be initiated in the next 1-2 weeks."  His wife had more questions today asking whether there is a specialist that can be brought in to help with the GI motility/absorption issues and what else can be done.  I tried to answer these as best I could, but counseled her that at this point we have really maximized what can be done for him and that while these baseline motility issues are certainly contributing to his overall status, I think that what can be done medically to support his body through this really difficult time is being done to the maximal extent.  I encouraged her to have these conversations with Dr. Freida Busman throughout the week.  Berna Bue, MD [facility] Surgery  03/08/23 9:15 AM

## 2023-03-09 DIAGNOSIS — K7200 Acute and subacute hepatic failure without coma: Secondary | ICD-10-CM

## 2023-03-09 DIAGNOSIS — Z7189 Other specified counseling: Secondary | ICD-10-CM

## 2023-03-09 DIAGNOSIS — R627 Adult failure to thrive: Secondary | ICD-10-CM

## 2023-03-09 DIAGNOSIS — K7201 Acute and subacute hepatic failure with coma: Secondary | ICD-10-CM

## 2023-03-09 LAB — CBC
HCT: 23.2 % — ABNORMAL LOW (ref 39.0–52.0)
Hemoglobin: 7.6 g/dL — ABNORMAL LOW (ref 13.0–17.0)
MCH: 29.5 pg (ref 26.0–34.0)
MCHC: 32.8 g/dL (ref 30.0–36.0)
MCV: 89.9 fL (ref 80.0–100.0)
Platelets: 154 10*3/uL (ref 150–400)
RBC: 2.58 MIL/uL — ABNORMAL LOW (ref 4.22–5.81)
RDW: 26.5 % — ABNORMAL HIGH (ref 11.5–15.5)
WBC: 15.1 10*3/uL — ABNORMAL HIGH (ref 4.0–10.5)
nRBC: 0 % (ref 0.0–0.2)

## 2023-03-09 LAB — BASIC METABOLIC PANEL
Anion gap: 15 (ref 5–15)
BUN: 95 mg/dL — ABNORMAL HIGH (ref 8–23)
CO2: 22 mmol/L (ref 22–32)
Calcium: 8 mg/dL — ABNORMAL LOW (ref 8.9–10.3)
Chloride: 98 mmol/L (ref 98–111)
Creatinine, Ser: 1.73 mg/dL — ABNORMAL HIGH (ref 0.61–1.24)
GFR, Estimated: 43 mL/min — ABNORMAL LOW (ref >60)
Glucose, Bld: 101 mg/dL — ABNORMAL HIGH (ref 70–99)
Potassium: 4.1 mmol/L (ref 3.5–5.1)
Sodium: 135 mmol/L (ref 135–145)

## 2023-03-09 LAB — AMMONIA: Ammonia: 181 umol/L — ABNORMAL HIGH (ref 9–35)

## 2023-03-09 LAB — GLUCOSE, CAPILLARY
Glucose-Capillary: 102 mg/dL — ABNORMAL HIGH (ref 70–99)
Glucose-Capillary: 102 mg/dL — ABNORMAL HIGH (ref 70–99)
Glucose-Capillary: 117 mg/dL — ABNORMAL HIGH (ref 70–99)
Glucose-Capillary: 118 mg/dL — ABNORMAL HIGH (ref 70–99)

## 2023-03-09 MED ORDER — LACTULOSE 10 GM/15ML PO SOLN
30.0000 g | Freq: Three times a day (TID) | ORAL | Status: DC
  Administered 2023-03-09 (×3): 30 g
  Filled 2023-03-09 (×3): qty 45

## 2023-03-09 MED ORDER — ORAL CARE MOUTH RINSE: 15.0000 mL | OROMUCOSAL | Status: DC

## 2023-03-09 MED ORDER — MORPHINE SULFATE (PF) 2 MG/ML IV SOLN
1.0000 mg | INTRAVENOUS | Status: DC | PRN
  Administered 2023-03-09: 1 mg via INTRAVENOUS
  Filled 2023-03-09: qty 1

## 2023-03-09 MED ORDER — ZINC CHLORIDE 1 MG/ML IV SOLN
INTRAVENOUS | Status: DC
  Filled 2023-03-09: qty 1000.9

## 2023-03-09 MED ORDER — ORAL CARE MOUTH RINSE
15.0000 mL | OROMUCOSAL | Status: DC
  Administered 2023-03-09 (×2): 15 mL via OROMUCOSAL

## 2023-03-09 MED ORDER — ORAL CARE MOUTH RINSE: 15.0000 mL | OROMUCOSAL | Status: DC | PRN

## 2023-03-09 MED ORDER — SODIUM CHLORIDE 0.9 % IV BOLUS
1000.0000 mL | Freq: Once | INTRAVENOUS | Status: AC
  Administered 2023-03-09: 1000 mL via INTRAVENOUS

## 2023-03-09 NOTE — Progress Notes (Addendum)
Patient no longer withdrawing to pain, trauma aware, care ongoing.   Dr. Andrey Campanile returned the page, orders received for ammonia levels. Again discussed patient's decreased BP, no new ordered received.

## 2023-03-09 NOTE — Progress Notes (Signed)
General Surgery Follow Up Note  Subjective:    Overnight Issues:  Wife at bedside.  Objective:  Vital signs for last 24 hours: Temp:  [97.6 F (36.4 C)-98.7 F (37.1 C)] 97.8 F (36.6 C) (06/02 0730) Pulse Rate:  [79-96] 96 (06/02 1000) Resp:  [18-32] 32 (06/02 1000) BP: (78-101)/(39-60) 87/41 (06/02 1000) SpO2:  [98 %-100 %] 98 % (06/02 1000) Weight:  [66.7 kg] 66.7 kg (06/02 0705)  Hemodynamic parameters for last 24 hours:    Intake/Output from previous day: 06/01 0701 - 06/02 0700 In: 4045.5 [I.V.:4006.7; IV Piggyback:38.8] Out: 4390 [Urine:2225; Emesis/NG output:1350; Drains:765; Stool:50]  Intake/Output this shift: Total I/O In: 1492.4 [I.V.:492.8; IV Piggyback:999.5] Out: -   Vent settings for last 24 hours:    Physical Exam:  Gen: somnolent, ill appearing and cachectic. Fetor Hepaticus. Neuro: lethargic- moreso than prior HEENT: scleral icterus; NG in place draining gastric contents, slight blood-tinge CV: RRR Pulm: slightly tachypneic and labored respirations Abd: stable distension, midline wound is granulating with minimal fibrinous exudate. LUQ JP with brown/thicker output. Duodenostomy to gravity with brown drainage. Ileostomy productive of liquid stool, nonbloody. Extr: wwp, no edema Skin: jaundiced GU: foley in place, dark urine  Results for orders placed or performed during the hospital encounter of 01/30/2023 (from the past 24 hour(s))  Glucose, capillary     Status: Abnormal   Collection Time: 03/08/23 12:04 PM  Result Value Ref Range   Glucose-Capillary 101 (H) 70 - 99 mg/dL  Glucose, capillary     Status: Abnormal   Collection Time: 03/08/23  4:25 PM  Result Value Ref Range   Glucose-Capillary 106 (H) 70 - 99 mg/dL  Glucose, capillary     Status: Abnormal   Collection Time: 03/08/23  7:24 PM  Result Value Ref Range   Glucose-Capillary 114 (H) 70 - 99 mg/dL  Glucose, capillary     Status: Abnormal   Collection Time: 03/08/23 11:26 PM   Result Value Ref Range   Glucose-Capillary 108 (H) 70 - 99 mg/dL  CBC     Status: Abnormal   Collection Time: 03/09/23  3:14 AM  Result Value Ref Range   WBC 15.1 (H) 4.0 - 10.5 K/uL   RBC 2.58 (L) 4.22 - 5.81 MIL/uL   Hemoglobin 7.6 (L) 13.0 - 17.0 g/dL   HCT 29.5 (L) 62.1 - 30.8 %   MCV 89.9 80.0 - 100.0 fL   MCH 29.5 26.0 - 34.0 pg   MCHC 32.8 30.0 - 36.0 g/dL   RDW 65.7 (H) 84.6 - 96.2 %   Platelets 154 150 - 400 K/uL   nRBC 0.0 0.0 - 0.2 %  Basic metabolic panel     Status: Abnormal   Collection Time: 03/09/23  3:14 AM  Result Value Ref Range   Sodium 135 135 - 145 mmol/L   Potassium 4.1 3.5 - 5.1 mmol/L   Chloride 98 98 - 111 mmol/L   CO2 22 22 - 32 mmol/L   Glucose, Bld 101 (H) 70 - 99 mg/dL   BUN 95 (H) 8 - 23 mg/dL   Creatinine, Ser 9.52 (H) 0.61 - 1.24 mg/dL   Calcium 8.0 (L) 8.9 - 10.3 mg/dL   GFR, Estimated 43 (L) >60 mL/min   Anion gap 15 5 - 15  Ammonia     Status: Abnormal   Collection Time: 03/09/23  3:14 AM  Result Value Ref Range   Ammonia 181 (H) 9 - 35 umol/L  Glucose, capillary     Status:  Abnormal   Collection Time: 03/09/23  3:24 AM  Result Value Ref Range   Glucose-Capillary 102 (H) 70 - 99 mg/dL  Glucose, capillary     Status: Abnormal   Collection Time: 03/09/23  7:55 AM  Result Value Ref Range   Glucose-Capillary 102 (H) 70 - 99 mg/dL    Assessment & Plan: The plan of care was discussed with the bedside nurse for the day, who is in agreement with this plan and no additional concerns were raised.   Present on Admission:  Bowel obstruction (HCC)  Protein-calorie malnutrition, severe (HCC)  Duodenal obstruction    LOS: 55 days   Additional comments:I reviewed the patient's new clinical lab test results.   and I reviewed the patients new imaging test results.    67 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  4/22 - Ex lap, adhesiolysis, duodenojejunal bypass, duodenostomy tube placement, T tube placement in common bile duct. 4/23 - Takeback  for bleeding with hemorrhagic shock, resection of ischemic transverse colon, left in discontinuity with Abthera 4/24 - Takeback for bleeding, abdominal washout, ileocecectomy, pack placement, placement of Abthera 4/25 - Takeback, resection of 10cm necrotic distal ileum, washout, removal of packs, placement of Abthera 4/27 - Takeback, placement of J tube, end ileostomy, abdominal closure   - Hyperbilirubinemia: Tube cholangiogram on 5/3 showed patency of the biliary tree with no obstruction. Likely multifactorial due to massive transfusion with hemolysis and shock liver, chronic TPN. Trend LFTs twice weekly. Tbili 29 5/30. - Keep D tube to gravity drainage. T tube dislodged and removed on 5/30. No frankly bilious output from JP drain though does look thicker and darker today, continue to monitor. - Pain: low-dose fentanyl patch, prn dilaudid. - Wound care: Dry gauze dressing to midline wound, granulating. - ID: None, previously completed course of ciprofloxacin for Pseudomonas pneumonia. No undrained intraabdominal collections on most recent C (5/23)T. - FEN: NPO. Patient has increased abdominal distension and discomfort with feeds, and increased output from NG and duodenostomy tubes, consistent with ongoing ileus and likely dysmotility. KUB this 5/31 shows bowel distension. Stop tube feeds. Continue full strength TPN. Increased BUN/Cr likely from GI losses. Give albumin bolus this morning. Continue bicarb infusion at 75 on top of TPN, acidosis improved with bicarb. - Hypotension: Patient has chronic low BP. Volume depletion from GI losses is also contributing. Continue midodrine 5mg  TID. Wean levophed for MAP>60. Intermittent crystalloid/albumin boluses.  - Urinary retention: Foley replaced on 5/20 for second time for urinary retention. Keep in place. - WOC following for ostomy care and teaching - PT/OT following. Patient is profoundly deconditioned, has made minimal PT progress recently. Transitioned  to tilt bed per PT recs to help increase mobility. - VTE: SCDs, lovenox on hold given recent GI bleeding.  - Dispo: ICU  Per Dr. Eliot Ford note 5/31-"I had a discussion with patient's wife this morning regarding long-term goals. He has made minimal progress recently, and is profoundly deconditioned. Participation with PT has been very limited. Patient has not been able to tolerate enteral feeds with multiple attempts, and will likely be TPN-dependent for many months. We continue to have issues with azotemia and likely biliary stasis, both of which can be exacerbated by TPN. The longer he is hospitalized with persistent weakness and deconditioning, the less likely it is that he will recover meaningful quality of life. I brought up consulting palliative care with patient's wife, to at least assist with pain management and help determine long-term goals for the patient. I emphasized this  does not mean we have to stop any medical interventions. Irving Burton is not yet ready to have palliative involved. For now, will hold feeds. This will hopefully improve abdominal distension and discomfort in the coming days. If NG and D tube output decrease, hopefully a D tube capping trial can be initiated in the next 1-2 weeks."  Over the weekend, his condition has continued to decline. Florid hepatic failure at this point with an ammonia level of 181. I had a long conversation with his wife again this morning and reiterated that at this point we have really maximized what can be done for him and that while these baseline motility issues are certainly contributing to his overall status, I think that everything which can be done medically to support his body through this, has been done by Dr. Freida Busman, and continues to the maximal extent. Despite this, his body is failing to improve and in my opinion, he is not going to survive this. That being said, I reassured her that we will continue doing everything that we can for him until he or she  tells [place] to stop.  I strongly encouraged her to consider meeting with palliative care.     Berna Bue, MD [facility]  03/09/23 10:17 AM

## 2023-03-09 NOTE — Consult Note (Signed)
Consultation Note Date: 03/09/2023   Patient Name: Bradley Dominguez  DOB: 1956-08-09  MRN: [id number]  Age / Sex: 67 y.o., male  PCP: Tisovec, Adelfa Koh, MD Referring Physician: Montez Morita, Md, MD  Reason for Consultation:  goals of care, terminal care  HPI/Patient Profile: 67 y.o. male  with past medical history of CHF, multiple bowel obstructions and surgeries admitted on 01/12/2023 with nausea, vomiting and bloating. 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 Has had multiple surgeries this admission, complicated by hemorrhagic shock, pseudomonas pneumonia. Now in fulminant hepatic failure, ammonia today was 181. BUN and CR trending up indicated renal failure. Today patient had mental status change, unresponsive to all stimuli. Palliative medicine consulted for goals of care and assistance with medical decision making in critically ill patient.   Primary Decision Maker NEXT OF KIN - spouse Bradley Dominguez  Discussion: Chart reviewed including labs, progress notes, imaging from this and previous encounters.  Evaluated patient. He appears to be actively dying with end of life breathing pattern, unresponsive, jaundiced.  Met with patient's spouse, son, and daughter in law. Dr. Doylene Canard and Dr. Freida Busman were also present and participated in discussion.  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  Known as very kind and loving, enjoyed being active.  Discussed with family that patient has multiple organs that are failing including his intestines, his liver, his kidneys, his mental status.  Unfortunately, medical interventions that can reverse his issues have been maximized.  Discussed option for transition to comfort measures only and allowing for natural dying process with symptom management. Transition to comfort measures includes stopping IV fluids, antibiotics, labs and providing symptom management for SOB,  anxiety, nausea, vomiting, and other symptoms of dying.  Encouraged patient/family to consider DNR/DNI status understanding evidenced based poor outcomes in similar hospitalized patients, as the cause of the arrest is likely associated with chronic/terminal disease rather than a reversible acute cardio-pulmonary event.  Family agrees to DNR order. Would like to continue current interventions. Recommended no escalation, he is already maxed out on pressors, family agrees.  They plan to call family members to come visit and consider transition to comfort measures. Encouraged them to have family come sooner than later, I worry that even with current interventions his time is limited to hours or days.      SUMMARY OF RECOMMENDATIONS -DNR- limited intervention, no escalation if further decline -Family considering transition to full comfort, calling family members in to visit -PMT will follow up with family tomorrow     Code Status/Advance Care Planning: DNR   Prognosis:   Hours - Days  Discharge Planning: To Be Determined  Primary Diagnoses: Present on Admission:  Bowel obstruction (HCC)  Protein-calorie malnutrition, severe (HCC)  Duodenal obstruction   Review of Systems  Physical Exam  Vital Signs: BP (!) 92/45   Pulse 91   Temp 98.6 F (37 C) (Oral)   Resp (!) 34   Ht 6\' 1"  (1.854 m)   Wt 66.7 kg   SpO2 96%  BMI 19.40 kg/m  Pain Scale: CPOT POSS *See Group Information*: S-Acceptable,Sleep, easy to arouse Pain Score: Asleep   SpO2: SpO2: 96 % O2 Device:SpO2: 96 % O2 Flow Rate: .O2 Flow Rate (L/min): 2 L/min  IO: Intake/output summary:  Intake/Output Summary (Last 24 hours) at 03/09/2023 1652 Last data filed at 03/09/2023 1600 Gross per 24 hour  Intake 4339.27 ml  Output 4260 ml  Net 79.27 ml    LBM: Last BM Date : 03/07/23 Baseline Weight: Weight: 68.1 kg Most recent weight: Weight: 66.7 kg       Thank you for this consult. Palliative medicine will continue  to follow and assist as needed.  Time Total: 90 minutes Greater than 50%  of this time was spent counseling and coordinating care related to the above assessment and plan.  Signed by: Ocie Bob, AGNP-C Palliative Medicine    Please contact Palliative Medicine Team phone at [phone] for questions and concerns.  For individual provider: See Loretha Stapler

## 2023-03-09 NOTE — Progress Notes (Signed)
Dr. Fredricka Bonine rounding and updated of patient no longer responding to pain overnight, continued RASS of -4, continued low BPs while on levo at 10, and ammonia level of 181 and no orders overnight from MD.

## 2023-03-09 NOTE — Progress Notes (Signed)
Patient has had significant clinical deterioration over the last 24-48 hours. Overnight he became unresponsive with no withdrawal to pain. AKI and azotemia are worsening and he is tachypneic. Remains hypotensive, with SBP in 80s on levo at 10. I met with the patient's wife Irving Burton, his son Alycia Rossetti, Ryan's wife and the palliative care team. We reviewed the patient's current status. He is now showing signs of liver dysfunction in addition to impending renal failure. AMS is likely multifactorial from hyperammonemia, azotemia and delirium. He has had progressive weakness this past week and I do not feel he has a meaningful chance of recovery. We have reached the limits of medical interventions, and further measures of support including mechanical ventilation and dialysis will not change his outcome. My surgical partner and the palliative care team are in agreement that he is actively dying. We discussed code status and transitioning to comfort measures. The family agrees to a DNR code status at this point, however we will continue current medical interventions until they have had time to discuss with other family members. I reviewed that we will continue levophed at the current dose of 10 but will not titrate up any further. Will also continue TPN and maintenance fluids for now, until family decides to transition to comfort measures. Appreciate assistance from palliative care team.  Sophronia Simas, MD [facility] General, Hepatobiliary and Pancreatic Surgery 03/09/23 4:54 PM

## 2023-03-09 NOTE — Progress Notes (Signed)
PHARMACY - TOTAL PARENTERAL NUTRITION CONSULT NOTE  Indication: Prolonged ileus, intolerance to enteral feeding  Patient Measurements: Height: 6\' 1"  (185.4 cm) Weight: 66.8 kg (147 lb 4.3 oz) IBW/kg (Calculated) : 79.9 TPN AdjBW (KG): 65.5 Body mass index is 19.43 kg/m.  Assessment: 67 yo M with hx intestinal surgery and subsequent SBO requiring additional resection, admitted 01/14/2023 with recurrent issues with SBO at the duodenum. Now s/p duodenal bypass complicated due to adhesions and resulted in small common bile duct injury requiring T-tube placement and external duodenostomy. Patient with massive bleed/hemorrhagic shock, s/p MTP. TPN initiated 4/9-5/6, then transitioned to TF. TF then held 5/14 given small bowel distension secondary to suspected ileus and likely dysmotility. Pharmacy consulted to resume TPN 5/16.  Patient was re-initiated on tube feeds 5/24 - stopped on 5/31 due to abdominal distension and NG output with c/f ileus  Glucose / Insulin: no hx DM - CBGs < 180.  No SSI. Electrolytes: K now wnl 4.1 after K removed from TPN yesterday (goal >/= 4 for ileus), Na wnl 135 after adding back small amount to TPN, last Phos was elevated at 5.1 (none in TPN), others WNL Renal: SCr 1.73 (BL ~0.7-0.9), BUN 95 - continues to rise (? If due to continuous low BP, protein content reduced down to 15% of total kcal) Hepatic: 5/30 LFTs elevated and stable, tbili up to 29.3, TG WNL, albumin 2.2 Intake / Output; MIVF: UOP 1400 mL, NGT (improved), L JP Drain ; 110 mL stool output; bicarb @ 75 ml/hr GI Imaging:  5/23 CT abd pelvis - post-surgical changes, questionable pneumonia  5/14 CT abd pelvis - feculent material throughout the SB/abdomen, suspicious for SB obstruction, bowel perforation/dehiscence, interval development of gas-containing fluid collection in R paracolic gutter 5/3 CT abdomen: bowel edema, no cause identified for T-bili elevation  5/3 cholangiogram: mild dilation of  bilary tract   4/4 UGI: Findings suggestive of chronic pSBO or ileus 4/9 CT: Ileus vs high grade partial SBO 5/31 Abd Xray: mildly dilated small bowel loop GI Surgeries / Procedures: 4/22 - Ex lap, adhesiolysis, duodenojejunal bypass, duodenostomy tube placement, T tube placement in common bile duct. 4/23 - Takeback for bleeding with hemorrhagic shock, resection of ischemic transverse colon, left in discontinuity with Abthera 4/24 - Takeback for bleeding, abdominal washout, ileocecectomy, pack placement, placement of Abthera 4/25 - Takeback, resection of 10cm necrotic distal ileum, washout, removal of packs, placement of Abthera 4/27 - Takeback, placement of J tube, end ileostomy, abdominal closure  Central access: triple lumen PICC placed 01/23/23 TPN start date: 01/14/23-02/10/23, 5/16>>  Nutritional Goals: Goal TPN rate is 95 mL/hr (provides 134 g of protein and 2307 kcals per day)  RD Estimated Needs Total Energy Estimated Needs: 2300 - 2600 - discussed with Pharmacy try to meet higher end of needs Total Protein Estimated Needs: 130-145 grams Total Fluid Estimated Needs: 2.1L/day  Current Nutrition:  TPN  Plan:  Discussed with CCS, Renal, and RD on 5/23. Reducing protein amount in an attempt to limit BUN increase. Aware of high lipid content, 30% of total calories but over 1 g/kg/day.  CHO and ILE content maximized to meet caloric needs. -Unable to really optimize protein amount further given high glucose content and GIR approaching max of 5mg /kg/min    Electrolytes in TPN: Na 25 mEq/L, add back small amount of K 25 mEq/L to keep >/= 4, Ca 31mEq/L, Mag 5 mEq/L, Phos 23mmol/L since 5/16, max acetate.  Standard MVI to TPN.  Remove standard trace elements,  add back zinc 5mg  Continue to hold chromium and selenium given renal dysfunction  Na-bicarb at 75 ml/hr per MD for persistent acidosis Monitor standard TPN labs on Mon/Thurs plus daily BMET daily for BUN/Scr and e-lyte monitoring  F/u  ability to resume tube feeds  Rexford Maus, PharmD, BCPS 03/09/2023 6:59 AM

## 2023-03-09 NOTE — Progress Notes (Signed)
Received in report that this patient stopped withdrawing to pain overnight. Upon initial assessment, patient completely unresponsive. His blood pressure has been systolic in the 80s, diastolic in the 40s, and MAP in the 50s all night on of norepinephrine with physicians being aware. Notified Dr. Fredricka Bonine who ordered a fluid bolus, 1L of NS, as well as lactulose to treat high ammonia.   Beryl Meager, RN

## 2023-03-09 NOTE — Progress Notes (Signed)
Wasted fentanyl patch with Raymon Mutton, RN  Beryl Meager, RN

## 2023-03-12 DIAGNOSIS — N179 Acute kidney failure, unspecified: Secondary | ICD-10-CM | POA: Diagnosis not present

## 2023-03-12 DIAGNOSIS — R7989 Other specified abnormal findings of blood chemistry: Secondary | ICD-10-CM | POA: Diagnosis not present

## 2023-03-12 DIAGNOSIS — K9289 Other specified diseases of the digestive system: Secondary | ICD-10-CM | POA: Diagnosis present

## 2023-03-12 DIAGNOSIS — K7200 Acute and subacute hepatic failure without coma: Secondary | ICD-10-CM | POA: Diagnosis not present

## 2023-03-12 DIAGNOSIS — F05 Delirium due to known physiological condition: Secondary | ICD-10-CM | POA: Diagnosis not present

## 2023-03-12 DIAGNOSIS — E7220 Disorder of urea cycle metabolism, unspecified: Secondary | ICD-10-CM | POA: Diagnosis not present

## 2023-04-07 NOTE — Progress Notes (Addendum)
..  Trauma Event Note    Reason for Call :  Dr. Andrey Campanile contacted regarding change in respirations and hypotension. Pt's wife not prepared for full comfort care at this time. Verbal order for RN to pronounce obtained.  Dr. Andrey Campanile notified of TOD 0503  Last imported Vital Signs BP (!) 68/38   Pulse 91   Temp 98.6 F (37 C) (Oral)   Resp 17   Ht 6\' 1"  (1.854 m)   Wt 147 lb 0.8 oz (66.7 kg)   SpO2 92%   BMI 19.40 kg/m   Trending CBC Recent Labs    03/07/23 0448 03/08/23 0839 03/09/23 0314  WBC 17.4* 18.0* 15.1*  HGB 8.8* 8.0* 7.6*  HCT 26.9* 24.8* 23.2*  PLT 157 147* 154    Trending Coag's No results for input(s): "APTT", "INR" in the last 72 hours.  Trending BMET Recent Labs    03/07/23 0448 03/08/23 0839 03/09/23 0314  NA 138 134* 135  K 5.0 5.2* 4.1  CL 111 102 98  CO2 16* 22 22  BUN 79* 93* 95*  CREATININE 1.42* 1.57* 1.73*  GLUCOSE 124* 117* 101*      Bradley Dominguez  Trauma Response RN  Please call TRN at [phone] for further assistance.

## 2023-04-07 NOTE — Progress Notes (Signed)
Patient's family requesting to not have CBGs and Temps done this shift.

## 2023-04-07 NOTE — Death Summary Note (Signed)
DEATH SUMMARY   Patient Details  Name: Bradley Dominguez MRN: [id number] DOB: 18-May-1956  Admission/Discharge Information   Admit Date:  02/04/23  Date of Death: Date of Death: Apr 01, 2023  Time of Death: Time of Death: 0503  Length of Stay: 04-07-2055  Referring Physician: Gaspar Garbe, MD   Reason(s) for Hospitalization  {Trauma:[id number]::"***"}  Diagnoses  Preliminary cause of death:  Secondary Diagnoses (including complications and co-morbidities):  Principal Problem:   Bowel obstruction (HCC) Active Problems:   Protein-calorie malnutrition, severe (HCC)   Gastrointestinal hemorrhage associated with acute gastritis   Duodenal obstruction   Acute respiratory failure with hypoxia (HCC)   Pneumonia due to Pseudomonas (HCC)   Pressure injury of skin   Brief Hospital Course (including significant findings, care, treatment, and services provided and events leading to death)  Bradley Dominguez is a 67 y.o. year old male who ***    Pertinent Labs and Studies  Significant Diagnostic Studies DG Abd 1 View  Result Date: 03/07/2023 CLINICAL DATA:  Ileus. EXAM: ABDOMEN - 1 VIEW COMPARISON:  Feb 18, 2023.  Feb 27, 2023. FINDINGS: Distal tip of nasogastric tube is seen in expected position of gastroesophageal junction. Surgical drain is seen with distal tip in right side of abdomen. Gastrostomy tube is seen with tip in right upper quadrant. Large amount of stool is seen in right side in central portion of abdomen; is uncertain if this is contain within cecum or other large bowel. Mildly dilated small bowel loop is noted in right lower quadrant as well. IMPRESSION: Distal tip of nasogastric tube is seen in expected position of gastroesophageal junction; advancement is recommended. Gastrostomy tube and surgical drain are again noted as described on prior CT scan. Large amount of stool seen in right side and central portion of abdomen which may be within dilated cecum, but CT scan is recommended for  further evaluation. Mildly dilated small bowel loop is noted in right lower quadrant. Electronically Signed   By: Lupita Raider M.D.   On: 03/07/2023 09:08   CT ABDOMEN PELVIS WO CONTRAST  Result Date: 02/27/2023 CLINICAL DATA:  Postoperative fluid collections EXAM: CT ABDOMEN AND PELVIS WITHOUT CONTRAST TECHNIQUE: Multidetector CT imaging of the abdomen and pelvis was performed following the standard protocol without IV contrast. RADIATION DOSE REDUCTION: This exam was performed according to the departmental dose-optimization program which includes automated exposure control, adjustment of the mA and/or kV according to patient size and/or use of iterative reconstruction technique. COMPARISON:  02/18/2023 FINDINGS: Lower chest: Two small fluid and gas collections in the anterior right lower lobe measuring 17-18 mm (series 5/images 11 and 15), poorly evaluated. Small right pleural effusion with mild patchy right lower lobe opacity, likely combination of atelectasis and pneumonia. Mild left basilar atelectasis. Hepatobiliary: Unenhanced liver is unremarkable. Status post cholecystectomy. Indwelling T-tube. No intrahepatic or extrahepatic ductal dilatation. Pancreas: Within normal limits. Spleen: Within normal limits. Adrenals/Urinary Tract: Adrenal glands are within normal limits. Kidneys are within normal limits. No renal calculi or hydronephrosis. Bladder is notable for an indwelling Foley catheter with small volume nondependent gas. Stomach/Bowel: Enteric tube terminates in the proximal stomach with surgical clips the inferior gastric antrum. Postsurgical changes involving the bowel with suture line in the anterior mid abdomen (series 3/image 04/06/36) and right mid/lower abdomen (series 3/53). No evidence of bowel obstruction. Prior distended/stool-filled bowel in the right mid/lower abdomen is now decompressed. Indwelling percutaneous catheter. Vascular/Lymphatic: No evidence of abdominal aortic aneurysm. No  suspicious abdominopelvic lymphadenopathy. Reproductive: Prostate is grossly  unremarkable. Other: Small volume abdominopelvic ascites with scattered small foci of nondependent gas (for example, beneath the anterior abdominal wall on series 3/image 27), likely postsurgical. Overlying midline skin staples. Musculoskeletal: Mild degenerative changes of the lumbar spine. IMPRESSION: Extensive postsurgical changes involving the stomach and bowel, as above. Additional support hardware. Small volume abdominopelvic ascites with scattered small foci of nondependent gas, likely postsurgical. No drainable fluid collection/abscess. Patchy right lower lobe opacity, suspicious for pneumonia, with small right pleural effusion. Possible small right lower lobe lung abscesses, incompletely visualized and poorly evaluated. Consider CT chest for further evaluation. Electronically Signed   By: Charline Bills M.D.   On: 02/27/2023 12:54   DG Abd 1 View  Result Date: 02/18/2023 CLINICAL DATA:  NG tube placement. EXAM: ABDOMEN - 1 VIEW COMPARISON:  01/18/2023 FINDINGS: Enteric tube is noted with tip overlying the mid stomach and side hole overlying the proximal to mid stomach. Bowel gas pattern is unchanged. IMPRESSION: Enteric tube with tip overlying the mid stomach. Electronically Signed   By: Harmon Pier M.D.   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 RADIATION DOSE REDUCTION: This exam was performed according to the departmental dose-optimization program which includes automated exposure control, adjustment of the mA  and/or kV according to patient size and/or use of iterative reconstruction technique. COMPARISON:  CT abdomen and pelvis dated 02/07/2023 FINDINGS: Lower chest: Similar right-greater-than-left lower lobe subsegmental atelectasis. Partially imaged moderate bilateral pleural effusions, similar on the right and slightly decreased on the left compared to 02/07/2023. Partially imaged heart size is normal. Hepatobiliary: Mildly heterogeneous parenchymal attenuation. Mild periportal edema. No intra or extrahepatic biliary ductal dilation. Cholecystectomy. Pancreas: No focal lesions or main ductal dilation. Spleen: Normal in size without focal abnormality. Adrenals/Urinary Tract: No adrenal nodules. No suspicious renal mass, calculi or hydronephrosis. Urinary bladder is decompressed with catheter in-situ. Stomach/Bowel: Multiple surgical clips near the gastroesophageal junction. Interval increased distention of the stomach and multiple right hemi abdominal small bowel loops which contains fecalized material more distally. 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 Right upper quadrant anastomosis is patent. Distal colon is decompressed and contains residual hyperattenuating material. Vascular/Lymphatic: No significant vascular findings are present. No enlarged abdominal or pelvic lymph nodes. Reproductive: Prostate is unremarkable. Other: Similar position of right upper quadrant biliary drainage catheter terminating near the hepatic hilum. A left lower quadrant approach drainage catheter courses along the anterior abdomen to terminate along the right flank, where there is new fecalized material. Right upper quadrant enteric catheter terminates in the proximal duodenum. Right lower quadrant enteric catheter terminates within a loop of right lower quadrant small bowel, which is markedly dilated and contains fecalized material. Small volume intraperitoneal free air appears  slightly increased compared to 02/07/2023 (3:28). Multiple additional small foci of extraluminal gas throughout the abdomen. Interval development of gas-containing fluid collection in the right paracolic gutter measuring 5.1 x 2.9 cm (3:56). Right hemipelvic collection measures 4.0 x 3.5 cm (3:79) which may correspond to redistribution of previously noted fluid collection in this area measuring 5.4 x 2.5 cm. A small fluid collection is also likely present within the left paracolic  gutter but is not well delineated from adjacent decompressed colon in the absence of contrast material. Small volume fluid throughout the abdomen. Musculoskeletal: No acute or abnormal lytic or blastic osseous lesions. Postsurgical changes of the anterior abdominal wall. Diffuse body wall edema. IMPRESSION: 1. Interval increased distention of the stomach and multiple right hemi abdominal small bowel loops which contain fecalized material, suspicious for small bowel obstruction. 2. Increased small volume intraperitoneal free air with multiple additional small foci of extraluminal gas throughout the abdomen, including adjacent to the surgical drainage catheter terminating in the right flank, where there is new feculent extraluminal material, suspicious for bowel perforation/dehiscence. 3. Interval development of gas-containing fluid collection in the right paracolic gutter measuring 5.1 x 2.9 cm. Right hemipelvic collection measures 4.0 x 3.5 cm. 4. Partially imaged moderate bilateral pleural effusions, similar on the right and slightly decreased on the left compared to 02/07/2023. 5. Diffuse body wall edema. Electronically Signed   By: Agustin Cree M.D.   On: 02/18/2023 09:14   US LIVER DOPPLER  Result Date: 02/15/2023 CLINICAL DATA:  Jaundice status post blood transfusion EXAM: DUPLEX ULTRASOUND OF LIVER TECHNIQUE: Color and duplex Doppler ultrasound was performed to evaluate the hepatic in-flow and out-flow vessels. COMPARISON:  CT  abdomen pelvis without contrast 02/07/2023 FINDINGS: Liver: Diffusely increased parenchymal echogenicity. Normal hepatic contour without nodularity. No focal lesion, mass or intrahepatic biliary ductal dilatation. Main Portal Vein size: 1.4 cm Portal Vein Velocities Main Prox:  18 cm/sec Main Mid: 16 cm/sec Main Dist:  21 cm/sec Right: 23 cm/sec Left: 21 cm/sec Hepatic Vein Velocities Right:  30 cm/sec Middle:  43 cm/sec Left:  25 cm/sec IVC: Present and patent with normal respiratory phasicity. Hepatic Artery Velocity:  125 cm/sec Splenic Vein Velocity:  Not visualized Spleen: Not visualized Portal Vein Occlusion/Thrombus: No Splenic Vein Occlusion/Thrombus: No Ascites: No ascites.  Minimal right pleural effusion. Varices: None IMPRESSION: 1. Diffuse increased echogenicity of the hepatic parenchyma is a nonspecific indicator of hepatocellular dysfunction, most commonly steatosis. 2. Patent portal vein with appropriate direction of flow. Electronically Signed   By: Acquanetta Belling M.D.   On: 02/15/2023 13:27    Microbiology No results found for this or any previous visit (from the past 240 hour(s)).  Lab Basic Metabolic Panel: Recent Labs  Lab 03/05/23 0600 03/06/23 0600 03/07/23 0448 03/08/23 0839 03/09/23 0314  NA 142 142 138 134* 135  K 4.3 4.5 5.0 5.2* 4.1  CL 109 114* 111 102 98  CO2 18* 15* 16* 22 22  GLUCOSE 112* 116* 124* 117* 101*  BUN 62* 66* 79* 93* 95*  CREATININE 1.15 1.29* 1.42* 1.57* 1.73*  CALCIUM 8.1* 8.4* 8.7* 8.2* 8.0*  MG  --  2.4  --  2.3  --   PHOS  --  5.1*  --   --   --    Liver Function Tests: Recent Labs  Lab 03/06/23 0600  AST 166*  ALT 179*  ALKPHOS 154*  BILITOT 29.3*  PROT 5.7*  ALBUMIN 2.2*   No results for input(s): "LIPASE", "AMYLASE" in the last 168 hours. Recent Labs  Lab 03/09/23 0314  AMMONIA 181*   CBC: Recent Labs  Lab 03/04/23 0444 03/06/23 0600 03/07/23 0448 03/08/23 0839 03/09/23 0314  WBC 15.0* 14.2* 17.4* 18.0* 15.1*  HGB  8.9* 8.6* 8.8* 8.0* 7.6*  HCT 27.3* 26.8* 26.9* 24.8* 23.2*  MCV 92.2 93.1 94.1 91.9 89.9  PLT 197 155 157 147* 154   Cardiac Enzymes: No results for input(s): "  CKTOTAL", "CKMB", "CKMBINDEX", "TROPONINI" in the last 168 hours. Sepsis Labs: Recent Labs  Lab 03/06/23 0600 03/07/23 0448 03/08/23 0839 03/09/23 0314  WBC 14.2* 17.4* 18.0* 15.1*    Procedures/Operations  ***   Fritzi Mandes 04/04/2023, 9:03 AM

## 2023-04-07 DEATH — deceased
